# Patient Record
Sex: Female | Born: 1960 | Race: White | Hispanic: No | Marital: Married | Smoking: Former smoker
Health system: Southern US, Community
[De-identification: ages and names within clinical notes are randomized; demographics above are authoritative.]

## PROBLEM LIST (undated history)

## (undated) DIAGNOSIS — D860 Sarcoidosis of lung: Secondary | ICD-10-CM

## (undated) DIAGNOSIS — I5189 Other ill-defined heart diseases: Secondary | ICD-10-CM

## (undated) DIAGNOSIS — R0602 Shortness of breath: Secondary | ICD-10-CM

## (undated) DIAGNOSIS — K219 Gastro-esophageal reflux disease without esophagitis: Secondary | ICD-10-CM

## (undated) DIAGNOSIS — E785 Hyperlipidemia, unspecified: Secondary | ICD-10-CM

## (undated) DIAGNOSIS — M1990 Unspecified osteoarthritis, unspecified site: Secondary | ICD-10-CM

## (undated) DIAGNOSIS — D649 Anemia, unspecified: Secondary | ICD-10-CM

## (undated) DIAGNOSIS — R062 Wheezing: Secondary | ICD-10-CM

## (undated) DIAGNOSIS — R600 Localized edema: Secondary | ICD-10-CM

## (undated) DIAGNOSIS — I10 Essential (primary) hypertension: Secondary | ICD-10-CM

## (undated) DIAGNOSIS — H8110 Benign paroxysmal vertigo, unspecified ear: Secondary | ICD-10-CM

## (undated) DIAGNOSIS — B589 Toxoplasmosis, unspecified: Secondary | ICD-10-CM

## (undated) DIAGNOSIS — E119 Type 2 diabetes mellitus without complications: Secondary | ICD-10-CM

## (undated) HISTORY — PX: WISDOM TOOTH EXTRACTION: SHX21

## (undated) HISTORY — PX: OTHER SURGICAL HISTORY: SHX169

---

## 1994-08-28 HISTORY — PX: CHOLECYSTECTOMY: SHX55

## 2000-05-28 ENCOUNTER — Emergency Department (HOSPITAL_COMMUNITY): Admission: EM | Admit: 2000-05-28 | Discharge: 2000-05-28 | Payer: Self-pay

## 2000-07-03 ENCOUNTER — Encounter: Payer: Self-pay | Admitting: Emergency Medicine

## 2000-07-03 ENCOUNTER — Emergency Department (HOSPITAL_COMMUNITY): Admission: EM | Admit: 2000-07-03 | Discharge: 2000-07-03 | Payer: Self-pay | Admitting: Emergency Medicine

## 2002-03-12 ENCOUNTER — Encounter: Admission: RE | Admit: 2002-03-12 | Discharge: 2002-06-10 | Payer: Self-pay | Admitting: Family Medicine

## 2004-02-11 ENCOUNTER — Other Ambulatory Visit: Admission: RE | Admit: 2004-02-11 | Discharge: 2004-02-11 | Payer: Self-pay | Admitting: Family Medicine

## 2009-11-18 ENCOUNTER — Emergency Department (HOSPITAL_COMMUNITY): Admission: EM | Admit: 2009-11-18 | Discharge: 2009-11-18 | Payer: Self-pay | Admitting: Emergency Medicine

## 2013-06-12 ENCOUNTER — Ambulatory Visit (HOSPITAL_COMMUNITY)
Admission: RE | Admit: 2013-06-12 | Discharge: 2013-06-12 | Disposition: A | Discharge status: Home / Self Care | Payer: 59 | Source: Ambulatory Visit | Attending: Internal Medicine | Admitting: Internal Medicine

## 2013-06-12 ENCOUNTER — Other Ambulatory Visit (HOSPITAL_COMMUNITY): Payer: Self-pay | Admitting: Internal Medicine

## 2013-06-12 DIAGNOSIS — R609 Edema, unspecified: Secondary | ICD-10-CM | POA: Insufficient documentation

## 2013-06-12 DIAGNOSIS — I517 Cardiomegaly: Secondary | ICD-10-CM

## 2013-06-12 DIAGNOSIS — I519 Heart disease, unspecified: Secondary | ICD-10-CM | POA: Insufficient documentation

## 2013-06-12 DIAGNOSIS — I515 Myocardial degeneration: Secondary | ICD-10-CM | POA: Insufficient documentation

## 2013-06-12 NOTE — Progress Notes (Signed)
Echocardiogram 2D Echocardiogram has been performed.  Marissa Wilkerson 06/12/2013, 11:10 AM

## 2013-06-16 ENCOUNTER — Other Ambulatory Visit: Payer: Self-pay | Admitting: Gastroenterology

## 2013-07-16 ENCOUNTER — Encounter (HOSPITAL_COMMUNITY): Payer: Self-pay | Admitting: Pharmacy Technician

## 2013-07-28 ENCOUNTER — Encounter (HOSPITAL_COMMUNITY): Payer: Self-pay | Admitting: *Deleted

## 2013-08-05 ENCOUNTER — Ambulatory Visit (HOSPITAL_COMMUNITY)
Admission: RE | Admit: 2013-08-05 | Discharge: 2013-08-05 | Disposition: A | Discharge status: Home / Self Care | Payer: 59 | Source: Ambulatory Visit | Attending: Gastroenterology | Admitting: Gastroenterology

## 2013-08-05 ENCOUNTER — Encounter (HOSPITAL_COMMUNITY)
Admission: RE | Disposition: A | Discharge status: Home / Self Care | Payer: Self-pay | Source: Ambulatory Visit | Attending: Gastroenterology

## 2013-08-05 ENCOUNTER — Ambulatory Visit (HOSPITAL_COMMUNITY): Payer: 59 | Admitting: Anesthesiology

## 2013-08-05 ENCOUNTER — Encounter (HOSPITAL_COMMUNITY): Payer: Self-pay | Admitting: *Deleted

## 2013-08-05 ENCOUNTER — Encounter (HOSPITAL_COMMUNITY): Payer: 59 | Admitting: Anesthesiology

## 2013-08-05 DIAGNOSIS — Z87891 Personal history of nicotine dependence: Secondary | ICD-10-CM | POA: Insufficient documentation

## 2013-08-05 DIAGNOSIS — Z794 Long term (current) use of insulin: Secondary | ICD-10-CM | POA: Insufficient documentation

## 2013-08-05 DIAGNOSIS — D126 Benign neoplasm of colon, unspecified: Secondary | ICD-10-CM | POA: Insufficient documentation

## 2013-08-05 DIAGNOSIS — Z1211 Encounter for screening for malignant neoplasm of colon: Secondary | ICD-10-CM | POA: Insufficient documentation

## 2013-08-05 DIAGNOSIS — E119 Type 2 diabetes mellitus without complications: Secondary | ICD-10-CM | POA: Insufficient documentation

## 2013-08-05 DIAGNOSIS — I10 Essential (primary) hypertension: Secondary | ICD-10-CM | POA: Insufficient documentation

## 2013-08-05 HISTORY — DX: Essential (primary) hypertension: I10

## 2013-08-05 HISTORY — DX: Unspecified osteoarthritis, unspecified site: M19.90

## 2013-08-05 HISTORY — PX: COLONOSCOPY WITH PROPOFOL: SHX5780

## 2013-08-05 HISTORY — DX: Gastro-esophageal reflux disease without esophagitis: K21.9

## 2013-08-05 HISTORY — DX: Other ill-defined heart diseases: I51.89

## 2013-08-05 HISTORY — DX: Type 2 diabetes mellitus without complications: E11.9

## 2013-08-05 SURGERY — COLONOSCOPY WITH PROPOFOL
Anesthesia: Monitor Anesthesia Care

## 2013-08-05 MED ORDER — ONDANSETRON HCL 4 MG/2ML IJ SOLN
INTRAMUSCULAR | Status: AC
  Filled 2013-08-05: qty 2

## 2013-08-05 MED ORDER — PROPOFOL 10 MG/ML IV BOLUS
INTRAVENOUS | Status: AC
  Filled 2013-08-05: qty 20

## 2013-08-05 MED ORDER — PROPOFOL INFUSION 10 MG/ML OPTIME
INTRAVENOUS | Status: DC | PRN
  Administered 2013-08-05: 140 ug/kg/min via INTRAVENOUS

## 2013-08-05 MED ORDER — SUCCINYLCHOLINE CHLORIDE 20 MG/ML IJ SOLN
INTRAMUSCULAR | Status: AC
  Filled 2013-08-05: qty 1

## 2013-08-05 MED ORDER — FENTANYL CITRATE 0.05 MG/ML IJ SOLN
INTRAMUSCULAR | Status: DC | PRN
  Administered 2013-08-05 (×2): 50 ug via INTRAVENOUS

## 2013-08-05 MED ORDER — ONDANSETRON HCL 4 MG/2ML IJ SOLN
INTRAMUSCULAR | Status: DC | PRN
  Administered 2013-08-05: 4 mg via INTRAVENOUS

## 2013-08-05 MED ORDER — LACTATED RINGERS IV SOLN
INTRAVENOUS | Status: DC
  Administered 2013-08-05: 1000 mL via INTRAVENOUS

## 2013-08-05 MED ORDER — KETAMINE HCL 10 MG/ML IJ SOLN
INTRAMUSCULAR | Status: DC | PRN
  Administered 2013-08-05: 25 mg via INTRAVENOUS

## 2013-08-05 MED ORDER — SODIUM CHLORIDE 0.9 % IV SOLN: INTRAVENOUS | Status: DC

## 2013-08-05 MED ORDER — MIDAZOLAM HCL 2 MG/2ML IJ SOLN
INTRAMUSCULAR | Status: AC
  Filled 2013-08-05: qty 2

## 2013-08-05 MED ORDER — PROMETHAZINE HCL 25 MG/ML IJ SOLN: 6.2500 mg | INTRAMUSCULAR | Status: DC | PRN

## 2013-08-05 MED ORDER — MIDAZOLAM HCL 5 MG/5ML IJ SOLN
INTRAMUSCULAR | Status: DC | PRN
  Administered 2013-08-05 (×2): 1 mg via INTRAVENOUS

## 2013-08-05 MED ORDER — FENTANYL CITRATE 0.05 MG/ML IJ SOLN
INTRAMUSCULAR | Status: AC
  Filled 2013-08-05: qty 2

## 2013-08-05 MED ORDER — KETAMINE HCL 50 MG/ML IJ SOLN
INTRAMUSCULAR | Status: AC
  Filled 2013-08-05: qty 10

## 2013-08-05 SURGICAL SUPPLY — 22 items

## 2013-08-05 NOTE — Preoperative (Signed)
Beta Blockers   Reason not to administer Beta Blockers:Not Applicable 

## 2013-08-05 NOTE — Op Note (Signed)
Procedure: Screening colonoscopy  Endoscopist: Earle Gell  Premedication: Propofol administered by anesthesia  Procedure: The patient was placed in the left lateral decubitus position. Anal inspection and digital rectal exam were normal. The Pentax pediatric colonoscope was introduced into the rectum and advanced to the cecum. A normal-appearing appendiceal orifice and ileocecal valve were identified. Colonic preparation for the exam today was good.  Rectum. Normal. I was unable to retroflex the endoscope in the rectum  Sigmoid colon and descending colon. Normal  Splenic flexure. Normal  Transverse colon. From the distal transverse colon, a 5 mm pedunculated polyp was removed with the hot snare and submitted for pathologic interpretation  Hepatic flexure. Normal  Ascending colon. Normal  Cecum and ileocecal valve. Normal  Assessment:  #1. From the distal transverse colon a 5 mm pedunculated polyp was removed and submitted for pathologic interpretation  #2. Otherwise normal screening proctocolonoscopy to the cecum  Recommendations: If colon polyp returns adenomatous pathologically, the patient should undergo a surveillance colonoscopy in 5 years. If the polyp returns non-neoplastic, the patient should undergo a repeat screening colonoscopy in 10 years

## 2013-08-05 NOTE — Anesthesia Postprocedure Evaluation (Signed)
  Anesthesia Post-op Note  Patient: Marissa Wilkerson  Procedure(s) Performed: Procedure(s) (LRB): COLONOSCOPY WITH PROPOFOL (N/A)  Patient Location: PACU  Anesthesia Type: MAC  Level of Consciousness: awake and alert   Airway and Oxygen Therapy: Patient Spontanous Breathing  Post-op Pain: mild  Post-op Assessment: Post-op Vital signs reviewed, Patient's Cardiovascular Status Stable, Respiratory Function Stable, Patent Airway and No signs of Nausea or vomiting  Last Vitals:  Filed Vitals:   08/05/13 0905  BP:   Pulse:   Temp: 36.8 C  Resp:     Post-op Vital Signs: stable   Complications: No apparent anesthesia complications

## 2013-08-05 NOTE — Anesthesia Preprocedure Evaluation (Signed)
Anesthesia Evaluation  Patient identified by MRN, date of birth, ID band Patient awake    Reviewed: Allergy & Precautions, H&P , NPO status , Patient's Chart, lab work & pertinent test results  Airway Mallampati: II TM Distance: >3 FB Neck ROM: Full    Dental no notable dental hx.    Pulmonary neg pulmonary ROS, former smoker,  breath sounds clear to auscultation  Pulmonary exam normal       Cardiovascular hypertension, Pt. on medications Rhythm:Regular Rate:Normal     Neuro/Psych negative neurological ROS  negative psych ROS   GI/Hepatic Neg liver ROS,   Endo/Other  negative endocrine ROSdiabetes, Insulin Dependent  Renal/GU negative Renal ROS  negative genitourinary   Musculoskeletal negative musculoskeletal ROS (+)   Abdominal   Peds negative pediatric ROS (+)  Hematology negative hematology ROS (+)   Anesthesia Other Findings   Reproductive/Obstetrics negative OB ROS                           Anesthesia Physical Anesthesia Plan  ASA: III  Anesthesia Plan: MAC   Post-op Pain Management:    Induction: Intravenous  Airway Management Planned: Nasal Cannula  Additional Equipment:   Intra-op Plan:   Post-operative Plan:   Informed Consent: I have reviewed the patients History and Physical, chart, labs and discussed the procedure including the risks, benefits and alternatives for the proposed anesthesia with the patient or authorized representative who has indicated his/her understanding and acceptance.   Dental advisory given  Plan Discussed with: CRNA and Surgeon  Anesthesia Plan Comments:         Anesthesia Quick Evaluation

## 2013-08-05 NOTE — H&P (Signed)
  Procedure: Screening colonoscopy  History: The patient is a 52 year old female born Apr 14, 1961. The patient is scheduled to undergo her first screening colonoscopy with polypectomy to prevent colon cancer.  Past medical history: Type 2 diabetes mellitus. Cholecystectomy.  Allergies: Sulfa drugs caused rash. Metformin caused diarrhea.  Exam: The patient is alert and lying comfortably on the endoscopy stretcher. Abdomen is soft and nontender to palpation. Cardiac exam reveals a regular rhythm. Lungs are clear to auscultation.  Plan: Proceed with baseline screening colonoscopy

## 2013-08-05 NOTE — Transfer of Care (Signed)
Immediate Anesthesia Transfer of Care Note  Patient: Marissa Wilkerson  Procedure(s) Performed: Procedure(s): COLONOSCOPY WITH PROPOFOL (N/A)  Patient Location: PACU  Anesthesia Type:MAC  Level of Consciousness: awake, alert  and oriented  Airway & Oxygen Therapy: Patient Spontanous Breathing and Patient connected to face mask oxygen  Post-op Assessment: Report given to PACU RN and Post -op Vital signs reviewed and stable  Post vital signs: Reviewed and stable  Complications: No apparent anesthesia complications

## 2013-08-06 ENCOUNTER — Encounter (HOSPITAL_COMMUNITY): Payer: Self-pay | Admitting: Gastroenterology

## 2013-09-02 ENCOUNTER — Encounter (HOSPITAL_COMMUNITY): Payer: Self-pay

## 2013-09-02 ENCOUNTER — Emergency Department (HOSPITAL_COMMUNITY): Payer: 59

## 2013-09-02 ENCOUNTER — Inpatient Hospital Stay (HOSPITAL_COMMUNITY)
Admission: EM | Admit: 2013-09-02 | Discharge: 2013-09-05 | DRG: 189 | Disposition: A | Discharge status: Home / Self Care | Payer: 59 | Attending: Internal Medicine | Admitting: Internal Medicine

## 2013-09-02 DIAGNOSIS — E8779 Other fluid overload: Secondary | ICD-10-CM | POA: Diagnosis present

## 2013-09-02 DIAGNOSIS — Z8249 Family history of ischemic heart disease and other diseases of the circulatory system: Secondary | ICD-10-CM

## 2013-09-02 DIAGNOSIS — Z791 Long term (current) use of non-steroidal anti-inflammatories (NSAID): Secondary | ICD-10-CM

## 2013-09-02 DIAGNOSIS — Z881 Allergy status to other antibiotic agents status: Secondary | ICD-10-CM

## 2013-09-02 DIAGNOSIS — Z794 Long term (current) use of insulin: Secondary | ICD-10-CM

## 2013-09-02 DIAGNOSIS — R918 Other nonspecific abnormal finding of lung field: Secondary | ICD-10-CM

## 2013-09-02 DIAGNOSIS — J9600 Acute respiratory failure, unspecified whether with hypoxia or hypercapnia: Principal | ICD-10-CM | POA: Diagnosis present

## 2013-09-02 DIAGNOSIS — E785 Hyperlipidemia, unspecified: Secondary | ICD-10-CM | POA: Diagnosis present

## 2013-09-02 DIAGNOSIS — J984 Other disorders of lung: Secondary | ICD-10-CM | POA: Diagnosis present

## 2013-09-02 DIAGNOSIS — Z833 Family history of diabetes mellitus: Secondary | ICD-10-CM

## 2013-09-02 DIAGNOSIS — E119 Type 2 diabetes mellitus without complications: Secondary | ICD-10-CM | POA: Diagnosis present

## 2013-09-02 DIAGNOSIS — Z87891 Personal history of nicotine dependence: Secondary | ICD-10-CM

## 2013-09-02 DIAGNOSIS — I10 Essential (primary) hypertension: Secondary | ICD-10-CM | POA: Diagnosis present

## 2013-09-02 DIAGNOSIS — Z91199 Patient's noncompliance with other medical treatment and regimen due to unspecified reason: Secondary | ICD-10-CM

## 2013-09-02 DIAGNOSIS — H5440 Blindness, one eye, unspecified eye: Secondary | ICD-10-CM | POA: Diagnosis present

## 2013-09-02 DIAGNOSIS — Z888 Allergy status to other drugs, medicaments and biological substances status: Secondary | ICD-10-CM

## 2013-09-02 DIAGNOSIS — J9601 Acute respiratory failure with hypoxia: Secondary | ICD-10-CM

## 2013-09-02 DIAGNOSIS — M129 Arthropathy, unspecified: Secondary | ICD-10-CM | POA: Diagnosis present

## 2013-09-02 DIAGNOSIS — Z7982 Long term (current) use of aspirin: Secondary | ICD-10-CM

## 2013-09-02 DIAGNOSIS — D860 Sarcoidosis of lung: Secondary | ICD-10-CM | POA: Diagnosis present

## 2013-09-02 DIAGNOSIS — I5189 Other ill-defined heart diseases: Secondary | ICD-10-CM | POA: Diagnosis present

## 2013-09-02 DIAGNOSIS — Z9119 Patient's noncompliance with other medical treatment and regimen: Secondary | ICD-10-CM

## 2013-09-02 DIAGNOSIS — J9610 Chronic respiratory failure, unspecified whether with hypoxia or hypercapnia: Secondary | ICD-10-CM | POA: Diagnosis present

## 2013-09-02 DIAGNOSIS — R0902 Hypoxemia: Secondary | ICD-10-CM | POA: Diagnosis present

## 2013-09-02 DIAGNOSIS — E669 Obesity, unspecified: Secondary | ICD-10-CM | POA: Diagnosis present

## 2013-09-02 DIAGNOSIS — R609 Edema, unspecified: Secondary | ICD-10-CM | POA: Diagnosis present

## 2013-09-02 DIAGNOSIS — K219 Gastro-esophageal reflux disease without esophagitis: Secondary | ICD-10-CM | POA: Diagnosis present

## 2013-09-02 DIAGNOSIS — Z79899 Other long term (current) drug therapy: Secondary | ICD-10-CM

## 2013-09-02 HISTORY — DX: Toxoplasmosis, unspecified: B58.9

## 2013-09-02 HISTORY — DX: Hyperlipidemia, unspecified: E78.5

## 2013-09-02 LAB — CBC WITH DIFFERENTIAL/PLATELET
BASOS ABS: 0.1 10*3/uL (ref 0.0–0.1)
Basophils Relative: 1 % (ref 0–1)
EOS ABS: 0.2 10*3/uL (ref 0.0–0.7)
Eosinophils Relative: 3 % (ref 0–5)
HCT: 35.2 % — ABNORMAL LOW (ref 36.0–46.0)
Hemoglobin: 12 g/dL (ref 12.0–15.0)
Lymphocytes Relative: 21 % (ref 12–46)
Lymphs Abs: 1.6 10*3/uL (ref 0.7–4.0)
MCH: 29.7 pg (ref 26.0–34.0)
MCHC: 34.1 g/dL (ref 30.0–36.0)
MCV: 87.1 fL (ref 78.0–100.0)
Monocytes Absolute: 0.9 10*3/uL (ref 0.1–1.0)
Monocytes Relative: 11 % (ref 3–12)
NEUTROS ABS: 4.9 10*3/uL (ref 1.7–7.7)
NEUTROS PCT: 64 % (ref 43–77)
PLATELETS: 319 10*3/uL (ref 150–400)
RBC: 4.04 MIL/uL (ref 3.87–5.11)
RDW: 15.8 % — AB (ref 11.5–15.5)
WBC: 7.7 10*3/uL (ref 4.0–10.5)

## 2013-09-02 LAB — POCT I-STAT, CHEM 8
BUN: 18 mg/dL (ref 6–23)
Calcium, Ion: 1.33 mmol/L — ABNORMAL HIGH (ref 1.12–1.23)
Chloride: 100 mEq/L (ref 96–112)
Creatinine, Ser: 0.7 mg/dL (ref 0.50–1.10)
Glucose, Bld: 174 mg/dL — ABNORMAL HIGH (ref 70–99)
HEMATOCRIT: 37 % (ref 36.0–46.0)
HEMOGLOBIN: 12.6 g/dL (ref 12.0–15.0)
Potassium: 4 mEq/L (ref 3.7–5.3)
SODIUM: 137 meq/L (ref 137–147)
TCO2: 23 mmol/L (ref 0–100)

## 2013-09-02 LAB — GLUCOSE, CAPILLARY
GLUCOSE-CAPILLARY: 196 mg/dL — AB (ref 70–99)
Glucose-Capillary: 147 mg/dL — ABNORMAL HIGH (ref 70–99)
Glucose-Capillary: 261 mg/dL — ABNORMAL HIGH (ref 70–99)

## 2013-09-02 LAB — POCT I-STAT TROPONIN I: Troponin i, poc: 0.01 ng/mL (ref 0.00–0.08)

## 2013-09-02 LAB — PRO B NATRIURETIC PEPTIDE: PRO B NATRI PEPTIDE: 91.1 pg/mL (ref 0–125)

## 2013-09-02 LAB — D-DIMER, QUANTITATIVE (NOT AT ARMC): D DIMER QUANT: 1.24 ug{FEU}/mL — AB (ref 0.00–0.48)

## 2013-09-02 MED ORDER — INSULIN ASPART 100 UNIT/ML ~~LOC~~ SOLN
0.0000 [IU] | Freq: Three times a day (TID) | SUBCUTANEOUS | Status: DC
  Administered 2013-09-02 (×2): 2 [IU] via SUBCUTANEOUS
  Administered 2013-09-03: 13:00:00 3 [IU] via SUBCUTANEOUS
  Administered 2013-09-03: 18:00:00 5 [IU] via SUBCUTANEOUS
  Administered 2013-09-03 – 2013-09-05 (×4): 2 [IU] via SUBCUTANEOUS
  Administered 2013-09-05: 08:00:00 5 [IU] via SUBCUTANEOUS

## 2013-09-02 MED ORDER — SODIUM CHLORIDE 0.9 % IJ SOLN: 3.0000 mL | INTRAMUSCULAR | Status: DC | PRN

## 2013-09-02 MED ORDER — INSULIN ASPART 100 UNIT/ML ~~LOC~~ SOLN
0.0000 [IU] | Freq: Every day | SUBCUTANEOUS | Status: DC
Start: 2013-09-02 — End: 2013-09-05
  Administered 2013-09-02: 22:00:00 via SUBCUTANEOUS
  Administered 2013-09-03: 22:00:00 3 [IU] via SUBCUTANEOUS

## 2013-09-02 MED ORDER — ATORVASTATIN CALCIUM 10 MG PO TABS
10.0000 mg | ORAL_TABLET | Freq: Every day | ORAL | Status: DC
  Filled 2013-09-02: qty 1

## 2013-09-02 MED ORDER — ONDANSETRON HCL 4 MG PO TABS: 4.0000 mg | ORAL_TABLET | Freq: Four times a day (QID) | ORAL | Status: DC | PRN

## 2013-09-02 MED ORDER — ALBUTEROL SULFATE (2.5 MG/3ML) 0.083% IN NEBU
2.5000 mg | INHALATION_SOLUTION | Freq: Once | RESPIRATORY_TRACT | Status: AC
  Administered 2013-09-02: 2.5 mg via RESPIRATORY_TRACT
  Filled 2013-09-02: qty 3

## 2013-09-02 MED ORDER — IBUPROFEN 800 MG PO TABS: 800.0000 mg | ORAL_TABLET | Freq: Four times a day (QID) | ORAL | Status: DC | PRN

## 2013-09-02 MED ORDER — SODIUM CHLORIDE 0.9 % IV SOLN: 250.0000 mL | INTRAVENOUS | Status: DC | PRN

## 2013-09-02 MED ORDER — ALBUTEROL SULFATE (2.5 MG/3ML) 0.083% IN NEBU: 2.5000 mg | INHALATION_SOLUTION | RESPIRATORY_TRACT | Status: DC | PRN

## 2013-09-02 MED ORDER — ADULT MULTIVITAMIN W/MINERALS CH
1.0000 | ORAL_TABLET | Freq: Every day | ORAL | Status: DC
  Filled 2013-09-02: qty 1

## 2013-09-02 MED ORDER — PIPERACILLIN-TAZOBACTAM 3.375 G IVPB 30 MIN
3.3750 g | Freq: Once | INTRAVENOUS | Status: DC
  Filled 2013-09-02: qty 50

## 2013-09-02 MED ORDER — SODIUM CHLORIDE 0.9 % IJ SOLN
3.0000 mL | Freq: Two times a day (BID) | INTRAMUSCULAR | Status: DC
  Administered 2013-09-02 – 2013-09-05 (×5): 3 mL via INTRAVENOUS

## 2013-09-02 MED ORDER — SODIUM CHLORIDE 0.9 % IJ SOLN
3.0000 mL | Freq: Two times a day (BID) | INTRAMUSCULAR | Status: DC
  Administered 2013-09-04: 3 mL via INTRAVENOUS

## 2013-09-02 MED ORDER — ENOXAPARIN SODIUM 40 MG/0.4ML ~~LOC~~ SOLN: 40.0000 mg | SUBCUTANEOUS | Status: DC

## 2013-09-02 MED ORDER — ASPIRIN EC 81 MG PO TBEC
81.0000 mg | DELAYED_RELEASE_TABLET | Freq: Every day | ORAL | Status: DC
  Filled 2013-09-02: qty 1

## 2013-09-02 MED ORDER — ENOXAPARIN SODIUM 60 MG/0.6ML ~~LOC~~ SOLN
50.0000 mg | SUBCUTANEOUS | Status: DC
  Administered 2013-09-02: 50 mg via SUBCUTANEOUS
  Filled 2013-09-02 (×2): qty 0.6

## 2013-09-02 MED ORDER — INSULIN GLARGINE 100 UNIT/ML ~~LOC~~ SOLN
30.0000 [IU] | Freq: Every day | SUBCUTANEOUS | Status: DC
  Administered 2013-09-02 – 2013-09-04 (×3): 30 [IU] via SUBCUTANEOUS
  Filled 2013-09-02 (×4): qty 0.3

## 2013-09-02 MED ORDER — ADULT MULTIVITAMIN W/MINERALS CH
1.0000 | ORAL_TABLET | Freq: Every day | ORAL | Status: DC
  Administered 2013-09-02 – 2013-09-04 (×3): 1 via ORAL
  Filled 2013-09-02 (×4): qty 1

## 2013-09-02 MED ORDER — HYDROCHLOROTHIAZIDE 12.5 MG PO CAPS
12.5000 mg | ORAL_CAPSULE | Freq: Every day | ORAL | Status: DC
  Administered 2013-09-02: 18:00:00 12.5 mg via ORAL
  Filled 2013-09-02 (×2): qty 1

## 2013-09-02 MED ORDER — CENTRUM ADULTS PO TABS: 1.0000 | ORAL_TABLET | Freq: Every day | ORAL | Status: DC

## 2013-09-02 MED ORDER — VANCOMYCIN HCL IN DEXTROSE 1-5 GM/200ML-% IV SOLN
1000.0000 mg | Freq: Once | INTRAVENOUS | Status: DC
  Filled 2013-09-02: qty 200

## 2013-09-02 MED ORDER — ACETAMINOPHEN 325 MG PO TABS
650.0000 mg | ORAL_TABLET | Freq: Four times a day (QID) | ORAL | Status: DC | PRN
  Administered 2013-09-03: 650 mg via ORAL
  Filled 2013-09-02: qty 2

## 2013-09-02 MED ORDER — LISINOPRIL 10 MG PO TABS
10.0000 mg | ORAL_TABLET | Freq: Every day | ORAL | Status: DC
  Administered 2013-09-02 – 2013-09-04 (×3): 10 mg via ORAL
  Filled 2013-09-02 (×4): qty 1

## 2013-09-02 MED ORDER — HYDROCHLOROTHIAZIDE 12.5 MG PO CAPS
12.5000 mg | ORAL_CAPSULE | Freq: Every day | ORAL | Status: DC
  Filled 2013-09-02: qty 1

## 2013-09-02 MED ORDER — FUROSEMIDE 10 MG/ML IJ SOLN
40.0000 mg | Freq: Once | INTRAMUSCULAR | Status: AC
  Administered 2013-09-02: 40 mg via INTRAVENOUS
  Filled 2013-09-02: qty 4

## 2013-09-02 MED ORDER — LISINOPRIL 10 MG PO TABS
10.0000 mg | ORAL_TABLET | Freq: Every day | ORAL | Status: DC
  Filled 2013-09-02: qty 1

## 2013-09-02 MED ORDER — ATORVASTATIN CALCIUM 10 MG PO TABS
10.0000 mg | ORAL_TABLET | Freq: Every day | ORAL | Status: DC
  Administered 2013-09-02 – 2013-09-04 (×3): 10 mg via ORAL
  Filled 2013-09-02 (×4): qty 1

## 2013-09-02 MED ORDER — IOHEXOL 350 MG/ML SOLN: 100.0000 mL | Freq: Once | INTRAVENOUS | Status: DC | PRN

## 2013-09-02 MED ORDER — LISINOPRIL-HYDROCHLOROTHIAZIDE 10-12.5 MG PO TABS: 1.0000 | ORAL_TABLET | Freq: Every day | ORAL | Status: DC

## 2013-09-02 MED ORDER — ASPIRIN EC 81 MG PO TBEC
81.0000 mg | DELAYED_RELEASE_TABLET | Freq: Every day | ORAL | Status: DC
  Administered 2013-09-02 – 2013-09-04 (×3): 81 mg via ORAL
  Filled 2013-09-02 (×4): qty 1

## 2013-09-02 MED ORDER — DIPHENHYDRAMINE HCL 25 MG PO CAPS
25.0000 mg | ORAL_CAPSULE | Freq: Every evening | ORAL | Status: DC | PRN
  Administered 2013-09-04: 25 mg via ORAL
  Filled 2013-09-02: qty 1

## 2013-09-02 MED ORDER — ONDANSETRON HCL 4 MG/2ML IJ SOLN: 4.0000 mg | Freq: Four times a day (QID) | INTRAMUSCULAR | Status: DC | PRN

## 2013-09-02 NOTE — H&P (Signed)
[facility]  History and Physical  Marissa Wilkerson [id number] DOB: [birth year] DOA: 09/02/2013  Referring physician: EDP PCP: Kandice Hams, MD  Outpatient Specialists:  1. None  Chief Complaint: Dyspnea  HPI: Marissa Wilkerson is a 53 y.o. 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 She gives approximately 3-4 month history of intermittent bilateral leg edema, attributed to diastolic dysfunction and Motrin use. She was started on diuretics with some improvement. 3 weeks ago, she first started noticing dyspnea on exertion while ambulating from the hospital parking deck to the hospital entrance. Since then this dyspnea hasn't fluctuated with some good days and some not. Last night while working on the hospital floor, her dyspnea on exertion seemed to get worse where even ambulating 150 feet caused her to stop. She checked her oxygen saturation which 83% on room air which improved after taking some deep breaths. She denies any fever, chills, night sweats, weight loss, cough, chest pain. She has 4 cats at home none of which are new. No birds or other pets at home. No recent travel. Was exposed to a family member with upper respiratory symptoms during Christmas. In the ED, patient was afebrile, no leukocytosis, normal proBNP, d-dimer elevated, CTA chest showed findings suggestive of pulmonary nodules/mass & no PE. Hospitalist admission requested.  Review of Systems: All systems reviewed and apart from history of presenting illness, are negative.  Past Medical History  Diagnosis Date  . Hypertension   . Diabetes mellitus without complication   . GERD (gastroesophageal reflux disease)   . Arthritis   . Diastolic dysfunction     followed by dr polite found on echo 06-12-13 , grade 1 per pt  .  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 Dyslipidemia    Past Surgical History  Procedure Laterality Date  . Cholecystectomy  1996  . Wisdom tooth extraction  yrs ago  . All teeth extraction  7-8 yrs ago  . Colonoscopy with propofol N/A 08/05/2013    Procedure: COLONOSCOPY WITH PROPOFOL;  Surgeon: Garlan Fair, MD;  Location: [facility];  Service: Endoscopy;  Laterality: N/A;   Social History:  reports that she quit smoking about 1 years ago. Her smoking use included Cigarettes. She has a 12.5 pack-year smoking history. She has never used smokeless tobacco. She reports that she drinks alcohol. She reports that she does not use illicit drugs.  married. 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  Allergies  Allergen Reactions  . Metformin And Related Nausea And Vomiting  . Sulfa Antibiotics Rash    Family History  Problem Relation Age of Onset  . Heart failure Mother   . Diabetes Father   . Hypertension Father   . Diabetes Brother   . Hypertension Brother     Prior to Admission medications   Medication Sig Start Date End Date Taking? Authorizing Provider  aspirin EC 81 MG tablet Take 81 mg by mouth daily.   Yes Historical Provider, MD  atorvastatin (LIPITOR) 10 MG tablet Take 10 mg by mouth at bedtime.   Yes Historical Provider, MD  diphenhydrAMINE (BENADRYL) 25 mg capsule Take 25 mg by mouth every 6 (six) hours as needed for sleep.   Yes Historical Provider,  MD  ibuprofen (ADVIL,MOTRIN) 200 MG tablet Take 800 mg by mouth every 6 (six) hours as needed (pain).   Yes Historical Provider, MD  insulin glargine (LANTUS) 100 UNIT/ML injection Inject 30 Units into the skin at bedtime.   Yes Historical Provider, MD  lisinopril-hydrochlorothiazide (PRINZIDE,ZESTORETIC) 10-12.5 MG per tablet Take 1 tablet by mouth at bedtime.   Yes Historical Provider, MD  Multiple Vitamins-Minerals (CENTRUM ADULTS)  TABS Take 1 tablet by mouth daily.   Yes Historical Provider, MD  sitaGLIPtin (JANUVIA) 100 MG tablet Take 100 mg by mouth at bedtime.   Yes Historical Provider, MD   Physical Exam: Filed Vitals:   09/02/13 0246 09/02/13 0506 09/02/13 0815  BP: 147/55  137/68  Pulse: 92  91  Temp: 99 F (37.2 C)  98.6 F (37 C)  TempSrc: Oral  Oral  Resp:   26  SpO2: 91% 93% 97%     General exam: Moderately built and  obese female  patient, lying comfortably  propped up on the gurney in no obvious distress.  Head, eyes and ENT: Nontraumatic and normocephalic. Pupils equally reacting to light and accommodation. Oral mucosa moist.  Neck: Supple. No JVD, carotid bruit or thyromegaly.  Lymphatics: No lymphadenopathy.  Respiratory system:  Occasional bibasal crackles but otherwise clear to auscultation . No increased work of breathing. Able to speak in full sentences.  Cardiovascular system: S1 and S2 heard, RRR. No JVD, murmurs, gallops, clicks. Trace bilateral ankle edema left greater than and right .  Gastrointestinal system: Abdomen is nondistended, soft and nontender. Normal bowel sounds heard. No organomegaly or masses appreciated. small area of periumbilical ecchymosis at site of insulin shot. Laparotomy scar RUQ.  Central nervous system: Alert and oriented. No focal neurological deficits.  Extremities: Symmetric 5 x 5 power. Peripheral pulses symmetrically felt.   Skin: No rashes or acute findings.  Musculoskeletal system: Negative exam.  Psychiatry: Pleasant and cooperative.   Labs on Admission:  Basic Metabolic Panel:  Recent Labs Lab 09/02/13 0528  NA 137  K 4.0  CL 100  GLUCOSE 174*  BUN 18  CREATININE 0.70   Liver Function Tests: No results found for this basename: AST, ALT, ALKPHOS, BILITOT, PROT, ALBUMIN,  in the last 168 hours No results found for this basename: LIPASE, AMYLASE,  in the last 168 hours No results found for this basename: AMMONIA,  in the last 168  hours CBC:  Recent Labs Lab 09/02/13 0515 09/02/13 0528  WBC 7.7  --   NEUTROABS 4.9  --   HGB 12.0 12.6  HCT 35.2* 37.0  MCV 87.1  --   PLT 319  --    Cardiac Enzymes: No results found for this basename: CKTOTAL, CKMB, CKMBINDEX, TROPONINI,  in the last 168 hours  BNP (last 3 results)  Recent Labs  09/02/13 0515  PROBNP 91.1   CBG: No results found for this basename: GLUCAP,  in the last 168 hours  Radiological Exams on Admission: Dg Chest 2 View  09/02/2013   CLINICAL DATA:  Shortness of breath  EXAM: CHEST  2 VIEW  COMPARISON:  11/18/2009  FINDINGS: Chronic cardiomegaly. Diffuse interstitial prominence. Streaky lower lung opacities, likely a combination of mild atelectasis and scar. Question trace left effusion.  IMPRESSION: Interstitial coarsening above baseline, which could be from airway infection or developing edema.   Electronically Signed   By: Jorje Guild M.D.   On: 09/02/2013 03:16   Ct Angio Chest Pe W/cm &/or Wo Cm  09/02/2013  CLINICAL DATA:  53 year old female with increasing shortness of Breath. Cardiac diastolic dysfunction. Abnormal D-dimer. Initial encounter.  EXAM: CT ANGIOGRAPHY CHEST WITH CONTRAST  TECHNIQUE: Multidetector CT imaging of the chest was performed using the standard protocol during bolus administration of intravenous contrast. Multiplanar CT image reconstructions including MIPs were obtained to evaluate the vascular anatomy.  CONTRAST:  100 mL Omnipaque 300.  COMPARISON:  Chest radiographs from the same day reported separately.  FINDINGS: Adequate contrast bolus timing in the pulmonary arterial tree.  No focal filling defect identified in the pulmonary arterial tree to suggest the presence of acute pulmonary embolism.  In the left lower lung affecting both the lingula and anterior basal segment of the lower lobe there is a mass like opacity measuring 40 x 31 x 25 mm, affecting both sides of the major fissure (series 7 image when 126).  There are  numerous superimposed solid to the semi solid pulmonary nodules throughout both lungs. Most measure 5-6 mm diameter, the largest are 10-11 mm. These head indistinct margins, resembling ground-glass. Superimposed bilateral mosaic attenuation. Additional more confluent opacity in both costophrenic sulci and dependently along the lateral right major fissure. Superimposed curvilinear probable scarring in the left apex. Major airways are patent.  No pericardial or pleural effusion, but there is mediastinal lymphadenopathy in the form of increased size and number of nodes, measuring up to 14 mm short axis individually. Bilateral hilar nodes are less affected. There is cardiomegaly.  Negative visualized liver dome and spleen. Thyromegaly with only a subcentimeter right lobe thyroid nodule visible, too small to characterize, but most likely benign in the absence of known clinical risk factors for thyroid carcinoma.  Visible aorta and great vessels are patent. No definite aortic atherosclerosis.  No acute osseous abnormality identified.  Review of the MIP images confirms the above findings.  IMPRESSION: 1.  No evidence of acute pulmonary embolus. 2. Abnormal lungs with numerous indistinct bilateral pulmonary nodules (semi-solid appearing), with superimposed confluent mass like pulmonary opacity in the left lower lung crossing the major fissure. Favor disseminated infection rather than neoplastic/metastatic disorder. Consider bacterial, fungal, and atypical etiologies (including invasive aspergillosis if the patient is immunocompromised). 3. Mediastinal lymphadenopathy, favor reactive.  Study discussed by telephone with APRIL [facility] on 09/02/2013 at 07:31. We discussed contact precautions and further infectious workup.   Electronically Signed   By: Lars Pinks M.D.   On: 09/02/2013 07:37    EKG: Independently reviewed.  Sinus rhythm without acute findings.  Assessment/Plan Principal Problem:   Acute respiratory  failure with hypoxia Active Problems:   Hypertension   Diabetes mellitus without complication   Diastolic dysfunction   Acute hypoxic respiratory failure/bilateral pulmonary nodules/left lower lung mass - DD: Atypical Infectious etiology versus malignancy versus other etiologies - CTA chest was negative for PE but showed bilateral pulmonary nodules and possible left lower lung mass - Admit to telemetry for close observation and evaluation. - Discussed with infectious disease M.D. on call and agree that there is no indication for any form of respiratory isolation at this time. - Pulmonology consulted for further evaluation. Options may be to treat empirically with antibiotics and followup CT in a couple of weeks versus bronchoscopy for cultures and biopsy. Vancomycin and Zosyn that were ordered in the ED were discontinued-patient does not look septic or toxic and antibiotics may muddy up the picture if biopsy planned.  Uncontrolled type II DM - Continue home dose of Lantus. Hold oral hypoglycemics. Add SSI. Monitor  Hypertension - Reasonably  controlled. Continue home medications  Dyslipidemia - Continue statins  History of diastolic dysfunction -   Code Status:  Full   Family Communication:  Discussed with spouse at bedside.  Disposition Plan:  Home when medically stable.   Time spent:  83 minutes  Ila Landowski, MD, FACP, FHM. [facility] Pager [phone]  If 7PM-7AM, please contact night-coverage [email or web address] Password [facility] 09/02/2013, 9:37 AM

## 2013-09-02 NOTE — ED Notes (Signed)
Bed: WA23 Expected date:  Expected time:  Means of arrival:  Comments: 

## 2013-09-02 NOTE — Consult Note (Signed)
Name: Marissa Wilkerson MRN: [id number] DOB: [birth year]    ADMISSION DATE:  09/02/2013 CONSULTATION DATE:  1-6  REFERRING MD :  [facility] PRIMARY SERVICE: [facility]  CHIEF COMPLAINT: SOB  SIGNIFICANT EVENTS / STUDIES:  TTE 06/12/13 >> normal LV size and fxn, grade 1 diastolic dysfxn, mild LA dilation, PASP 33mmHg,   HISTORY OF PRESENT ILLNESS:  53 yo wf(Obese) former smoker(quit 1 year ago) poorly controlled diabetic and HTN who presents to [facility] 1-6 with hypoxia(84% on RA). She has dyspnea that has been insidious over about 3-4 weeks.  She denies FCS or purulent sputum production.  No recent URI or unusual exposures or travels. 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 Of note she recently had loss of taste and smell attributed to uncontrolled DM. Further since sept. 2014 increased lower ext edema 4+ treated with HCTZ per Dr. Delfina Redwood. Following her presentation to [facility] CT angio revealed Abnormal lungs with numerous indistinct bilateral pulmonary nodules (semi-solid appearing), with superimposed confluent mass  like pulmonary opacity in the left lower lung crossing the major fissure. There was no PE. TTE from 10/14 showed no evidence of PAH or LV dysfxn.    PAST MEDICAL HISTORY :  Past Medical History  Diagnosis Date  . Hypertension   . Diabetes mellitus without complication   . GERD (gastroesophageal reflux disease)   . Arthritis   . Diastolic dysfunction     followed by dr polite found on echo 06-12-13 , grade 1 per pt  . 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 Dyslipidemia    Past Surgical History  Procedure Laterality Date  . Cholecystectomy  1996  . Wisdom tooth extraction  yrs ago  . All teeth extraction  7-8 yrs ago  . Colonoscopy with propofol N/A 08/05/2013    Procedure: COLONOSCOPY WITH PROPOFOL;  Surgeon: Garlan Fair, MD;  Location: [facility] ENDOSCOPY;  Service: Endoscopy;  Laterality: N/A;    Prior to Admission medications   Medication Sig Start Date End Date Taking? Authorizing Provider  aspirin EC 81 MG tablet Take 81 mg by mouth daily.   Yes Historical Provider, MD  atorvastatin (LIPITOR) 10 MG tablet Take 10 mg by mouth at bedtime.   Yes Historical Provider, MD  diphenhydrAMINE (BENADRYL) 25 mg capsule Take 25 mg by mouth every 6 (six) hours as needed for sleep.   Yes Historical Provider, MD  ibuprofen (ADVIL,MOTRIN) 200 MG tablet Take 800 mg by mouth every 6 (six) hours as needed (pain).   Yes Historical Provider, MD  insulin glargine (LANTUS) 100 UNIT/ML injection Inject 30 Units into the skin at bedtime.   Yes Historical Provider, MD  lisinopril-hydrochlorothiazide (PRINZIDE,ZESTORETIC) 10-12.5 MG per tablet Take 1 tablet by mouth at bedtime.   Yes Historical Provider, MD  Multiple Vitamins-Minerals (CENTRUM ADULTS) TABS Take 1 tablet by mouth daily.   Yes Historical Provider, MD  sitaGLIPtin (JANUVIA) 100 MG tablet Take 100 mg by mouth at bedtime.   Yes Historical Provider, MD   Allergies  Allergen Reactions  . Metformin And Related Nausea And Vomiting  . Sulfa Antibiotics Rash    FAMILY HISTORY:  Family History  Problem Relation Age of Onset  . Heart failure Mother   . Diabetes Father   . Hypertension Father   . Diabetes Brother   . Hypertension Brother    SOCIAL HISTORY:  reports that she  quit smoking about 1 years ago. Her smoking use included Cigarettes. She has a 12.5 pack-year smoking history. She has never used smokeless tobacco. She reports that she drinks alcohol. She reports that she does not use illicit drugs. Works as a Marine scientist. No significant travel or exposures.   REVIEW OF SYSTEMS:  10 point review of system taken, please see HPI for positives and negatives.   SUBJECTIVE:  Feels well as long as she is still. When walking she is SOB  VITAL SIGNS: Temp:  [97.9 F (36.6 C)-98.8 F (37.1 C)] 97.9 F (36.6 C) (01/07 0405) Pulse Rate:  [74-89]  74 (01/07 0405) Resp:  [20] 20 (01/07 0405) BP: (112-146)/(50-63) 112/56 mmHg (01/07 0405) SpO2:  [85 %-99 %] 99 % (01/07 0405) Weight:  [94.394 kg (208 lb 1.6 oz)-95.1 kg (209 lb 10.5 oz)] 95.1 kg (209 lb 10.5 oz) (01/07 0405) HEMODYNAMICS:   VENTILATOR SETTINGS:   INTAKE / OUTPUT: Intake/Output     01/06 0701 - 01/07 0700 01/07 0701 - 01/08 0700   P.O. 360 480   Total Intake(mL/kg) 360 (3.8) 480 (5)   Urine (mL/kg/hr) 1350 (0.6)    Total Output 1350     Net -990 +480        Urine Occurrence 1 x    Stool Occurrence 2 x      PHYSICAL EXAMINATION: General: Obese WF NAD at rest on O2 Neuro:  Intact HEENT:  No LAN/JVD Cardiovascular:  HSR RRR Lungs:  CTA Abdomen:  Obese +bs Musculoskeletal:  Intact Skin:  Rt foot healing cat scratch mark noted, + le edema (better with IV lasix)  LABS:  CBC  Recent Labs Lab 09/02/13 0515 09/02/13 0528  WBC 7.7  --   HGB 12.0 12.6  HCT 35.2* 37.0  PLT 319  --    Coag's No results found for this basename: APTT, INR,  in the last 168 hours BMET  Recent Labs Lab 09/02/13 0528  NA 137  K 4.0  CL 100  BUN 18  CREATININE 0.70  GLUCOSE 174*   Electrolytes No results found for this basename: CALCIUM, MG, PHOS,  in the last 168 hours Sepsis Markers No results found for this basename: LATICACIDVEN, PROCALCITON, O2SATVEN,  in the last 168 hours ABG No results found for this basename: PHART, PCO2ART, PO2ART,  in the last 168 hours Liver Enzymes No results found for this basename: AST, ALT, ALKPHOS, BILITOT, ALBUMIN,  in the last 168 hours Cardiac Enzymes  Recent Labs Lab 09/02/13 0515  PROBNP 91.1   Glucose  Recent Labs Lab 09/02/13 1158 09/02/13 1712 09/02/13 2128 09/03/13 0729  GLUCAP 147* 196* 261* 167*    Imaging Dg Chest 2 View  09/02/2013   CLINICAL DATA:  Shortness of breath  EXAM: CHEST  2 VIEW  COMPARISON:  11/18/2009  FINDINGS: Chronic cardiomegaly. Diffuse interstitial prominence. Streaky lower lung  opacities, likely a combination of mild atelectasis and scar. Question trace left effusion.  IMPRESSION: Interstitial coarsening above baseline, which could be from airway infection or developing edema.   Electronically Signed   By: Jorje Guild M.D.   On: 09/02/2013 03:16   Ct Angio Chest Pe W/cm &/or Wo Cm  09/02/2013   CLINICAL DATA:  53 year old female with increasing shortness of Breath. Cardiac diastolic dysfunction. Abnormal D-dimer. Initial encounter.  EXAM: CT ANGIOGRAPHY CHEST WITH CONTRAST  TECHNIQUE: Multidetector CT imaging of the chest was performed using the standard protocol during bolus administration of intravenous contrast. Multiplanar CT image reconstructions  including MIPs were obtained to evaluate the vascular anatomy.  CONTRAST:  100 mL Omnipaque 300.  COMPARISON:  Chest radiographs from the same day reported separately.  FINDINGS: Adequate contrast bolus timing in the pulmonary arterial tree.  No focal filling defect identified in the pulmonary arterial tree to suggest the presence of acute pulmonary embolism.  In the left lower lung affecting both the lingula and anterior basal segment of the lower lobe there is a mass like opacity measuring 40 x 31 x 25 mm, affecting both sides of the major fissure (series 7 image when 126).  There are numerous superimposed solid to the semi solid pulmonary nodules throughout both lungs. Most measure 5-6 mm diameter, the largest are 10-11 mm. These head indistinct margins, resembling ground-glass. Superimposed bilateral mosaic attenuation. Additional more confluent opacity in both costophrenic sulci and dependently along the lateral right major fissure. Superimposed curvilinear probable scarring in the left apex. Major airways are patent.  No pericardial or pleural effusion, but there is mediastinal lymphadenopathy in the form of increased size and number of nodes, measuring up to 14 mm short axis individually. Bilateral hilar nodes are less affected.  There is cardiomegaly.  Negative visualized liver dome and spleen. Thyromegaly with only a subcentimeter right lobe thyroid nodule visible, too small to characterize, but most likely benign in the absence of known clinical risk factors for thyroid carcinoma.  Visible aorta and great vessels are patent. No definite aortic atherosclerosis.  No acute osseous abnormality identified.  Review of the MIP images confirms the above findings.  IMPRESSION: 1.  No evidence of acute pulmonary embolus. 2. Abnormal lungs with numerous indistinct bilateral pulmonary nodules (semi-solid appearing), with superimposed confluent mass like pulmonary opacity in the left lower lung crossing the major fissure. Favor disseminated infection rather than neoplastic/metastatic disorder. Consider bacterial, fungal, and atypical etiologies (including invasive aspergillosis if the patient is immunocompromised). 3. Mediastinal lymphadenopathy, favor reactive.  Study discussed by telephone with APRIL [facility] on 09/02/2013 at 07:31. We discussed contact precautions and further infectious workup.   Electronically Signed   By: Lars Pinks M.D.   On: 09/02/2013 07:37     ASSESSMENT / PLAN:  PULMONARY A:  1 - Hypoxic Resp failure in setting of volume overload (better with lasix), Tobacco use, abnormal CT. No evidence PE on the CT scan 2- Abnormal CT scan chest . Bilateral semisolid nodules with a more confluent consolidated lesion LLL. DDx includes auto-immune process, atypical infxn, less likely malignancy.  P:   O2 as needed Will need full PFT, will try to get these today 1/7 Will check serological studies to r/o autoimmune processes FOB on 1/8 for BAL, brushings, biopsies Further cardiological work up in future Better control of Htn/DM should be beneficial   Richardson Landry Minor ACNP Maryanna Shape PCCM Pager [phone] till 3 pm If no answer page [phone] 09/03/2013, 11:13 AM  Baltazar Apo, MD, PhD 09/03/2013, 11:23 AM [facility] [phone] or if no answer [phone]

## 2013-09-02 NOTE — ED Notes (Addendum)
Pt states that she was dx with diastolic dysfunction in October 14 but recently she has been feeling more SOB especially with walking into the building to work. States she takes HCTZ/lisinopril. 82% RA upon exertion/walking to ER. No recent illness.

## 2013-09-02 NOTE — Progress Notes (Signed)
UR completed 

## 2013-09-02 NOTE — ED Notes (Signed)
MD at bedside. 

## 2013-09-02 NOTE — ED Notes (Signed)
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 Her O2 sats were 82 when she arrived in triage

## 2013-09-02 NOTE — ED Provider Notes (Signed)
CSN: [id number]     Arrival date & time 09/02/13  0242 History   First MD Initiated Contact with Patient 09/02/13 0441     Chief Complaint  Patient presents with  . Shortness of Breath   (Consider location/radiation/quality/duration/timing/severity/associated sxs/prior Treatment) Patient is a 53 y.o. female presenting with shortness of breath. The history is provided by the patient.  Shortness of Breath Severity:  Severe Onset quality:  Gradual Timing:  Constant Progression:  Worsening Chronicity:  New Context: activity   Context comment:  Walking.  No orthopnea no PND.  Has leg swelling it isn't new.  No long car trips or plane trips Relieved by:  Nothing Worsened by:  Nothing tried Ineffective treatments:  None tried Associated symptoms: no abdominal pain, no chest pain, no cough, no diaphoresis, no fever and no wheezing   Risk factors: no recent surgery     Past Medical History  Diagnosis Date  . Hypertension   . Diabetes mellitus without complication   . GERD (gastroesophageal reflux disease)   . Arthritis   . Diastolic dysfunction     followed by dr polite found on echo 06-12-13 , grade 1 per pt   Past Surgical History  Procedure Laterality Date  . Cholecystectomy  1996  . Wisdom tooth extraction  yrs ago  . All teeth extraction  7-8 yrs ago  . Colonoscopy with propofol N/A 08/05/2013    Procedure: COLONOSCOPY WITH PROPOFOL;  Surgeon: Garlan Fair, MD;  Location: [facility];  Service: Endoscopy;  Laterality: N/A;   No family history on file. History  Substance Use Topics  . Smoking status: Former Smoker -- 0.50 packs/day for 25 years    Types: Cigarettes    Quit date: 09/22/2011  . Smokeless tobacco: Never Used  . Alcohol Use: Yes     Comment: very rare   OB History   Grav Para Term Preterm Abortions TAB SAB Ect Mult Living                 Review of Systems  Constitutional: Negative for fever and diaphoresis.  Respiratory: Positive for shortness of  breath. Negative for cough, chest tightness and wheezing.   Cardiovascular: Positive for leg swelling. Negative for chest pain and palpitations.  Gastrointestinal: Negative for abdominal pain.  All other systems reviewed and are negative.    Allergies  Metformin and related and Sulfa antibiotics  Home Medications   Current Outpatient Rx  Name  Route  Sig  Dispense  Refill  . aspirin EC 81 MG tablet   Oral   Take 81 mg by mouth daily.         [name] atorvastatin (LIPITOR) 10 MG tablet   Oral   Take 10 mg by mouth at bedtime.         . diphenhydrAMINE (BENADRYL) 25 mg capsule   Oral   Take 25 mg by mouth every 6 (six) hours as needed for sleep.         [name] ibuprofen (ADVIL,MOTRIN) 200 MG tablet   Oral   Take 800 mg by mouth every 6 (six) hours as needed (pain).         . insulin glargine (LANTUS) 100 UNIT/ML injection   Subcutaneous   Inject 30 Units into the skin at bedtime.         [name] lisinopril-hydrochlorothiazide (PRINZIDE,ZESTORETIC) 10-12.5 MG per tablet   Oral   Take 1 tablet by mouth at bedtime.         [name]  Multiple Vitamins-Minerals (CENTRUM ADULTS) TABS   Oral   Take 1 tablet by mouth daily.         . sitaGLIPtin (JANUVIA) 100 MG tablet   Oral   Take 100 mg by mouth at bedtime.          BP 147/55  Pulse 92  Temp(Src) 99 F (37.2 C) (Oral)  SpO2 93%  LMP 08/28/2008 Physical Exam  Constitutional: She is oriented to person, place, and time. She appears well-developed and well-nourished. No distress.  HENT:  Head: Normocephalic and atraumatic.  Mouth/Throat: Oropharynx is clear and moist.  Eyes: Conjunctivae are normal. Pupils are equal, round, and reactive to light.  Neck: Normal range of motion. Neck supple.  Cardiovascular: Normal rate, regular rhythm and intact distal pulses.   Pulmonary/Chest: No stridor. No respiratory distress. She has wheezes. She exhibits no tenderness.  Abdominal: Soft. Bowel sounds are normal. There is no tenderness.  There is no rebound and no guarding.  Musculoskeletal: She exhibits edema. She exhibits no tenderness.  Neurological: She is alert and oriented to person, place, and time.  Skin: Skin is warm and dry.  Psychiatric: She has a normal mood and affect.    ED Course  Procedures (including critical care time) Labs Review Labs Reviewed  CBC WITH DIFFERENTIAL - Abnormal; Notable for the following:    HCT 35.2 (*)    RDW 15.8 (*)    All other components within normal limits  D-DIMER, QUANTITATIVE - Abnormal; Notable for the following:    D-Dimer, Quant 1.24 (*)    All other components within normal limits  POCT I-STAT, CHEM 8 - Abnormal; Notable for the following:    Glucose, Bld 174 (*)    Calcium, Ion 1.33 (*)    All other components within normal limits  PRO B NATRIURETIC PEPTIDE  POCT I-STAT TROPONIN I   Imaging Review Dg Chest 2 View  09/02/2013   CLINICAL DATA:  Shortness of breath  EXAM: CHEST  2 VIEW  COMPARISON:  11/18/2009  FINDINGS: Chronic cardiomegaly. Diffuse interstitial prominence. Streaky lower lung opacities, likely a combination of mild atelectasis and scar. Question trace left effusion.  IMPRESSION: Interstitial coarsening above baseline, which could be from airway infection or developing edema.   Electronically Signed   By: Jorje Guild M.D.   On: 09/02/2013 03:16    EKG Interpretation    Date/Time:  Tuesday September 02 2013 05:01:15 EST Ventricular Rate:  90 PR Interval:  148 QRS Duration: 96 QT Interval:  362 QTC Calculation: 443 R Axis:   69 Text Interpretation:  Sinus rhythm Confirmed by [facility]  MD, Joriel Streety ([id number]) on 09/02/2013 5:07:14 AM            MDM  No diagnosis found. MDM Number of Diagnoses or Management Options Hypoxia:  MDM Reviewed: nursing note and vitals Interpretation: labs, ECG and x-ray Consults: admitting MD    ,    Analese Sovine K Veronnica Hennings-Rasch, MD 09/03/13 [phone]

## 2013-09-02 NOTE — ED Notes (Addendum)
Critical Care NP at bedside 

## 2013-09-02 NOTE — ED Notes (Signed)
Bed: [id number] Expected date:  Expected time:  Means of arrival:  Comments: Hold for 23

## 2013-09-03 DIAGNOSIS — J9600 Acute respiratory failure, unspecified whether with hypoxia or hypercapnia: Principal | ICD-10-CM

## 2013-09-03 DIAGNOSIS — R918 Other nonspecific abnormal finding of lung field: Secondary | ICD-10-CM

## 2013-09-03 DIAGNOSIS — I519 Heart disease, unspecified: Secondary | ICD-10-CM

## 2013-09-03 LAB — GLUCOSE, CAPILLARY
Glucose-Capillary: 167 mg/dL — ABNORMAL HIGH (ref 70–99)
Glucose-Capillary: 206 mg/dL — ABNORMAL HIGH (ref 70–99)
Glucose-Capillary: 256 mg/dL — ABNORMAL HIGH (ref 70–99)
Glucose-Capillary: 258 mg/dL — ABNORMAL HIGH (ref 70–99)

## 2013-09-03 LAB — HEMOGLOBIN A1C
HEMOGLOBIN A1C: 9 % — AB (ref <5.7)
Mean Plasma Glucose: 212 mg/dL — ABNORMAL HIGH (ref <117)

## 2013-09-03 LAB — HIV ANTIBODY (ROUTINE TESTING W REFLEX): HIV: NONREACTIVE

## 2013-09-03 LAB — EOSINOPHIL COUNT: Eosinophils Absolute: 0.4 10*3/uL (ref 0.0–0.7)

## 2013-09-03 LAB — RHEUMATOID FACTOR: Rhuematoid fact SerPl-aCnc: <10 IU/mL (ref <=14)

## 2013-09-03 MED ORDER — POTASSIUM CHLORIDE CRYS ER 10 MEQ PO TBCR
10.0000 meq | EXTENDED_RELEASE_TABLET | Freq: Every day | ORAL | Status: DC
  Administered 2013-09-03 – 2013-09-05 (×3): 10 meq via ORAL
  Filled 2013-09-03 (×3): qty 1

## 2013-09-03 MED ORDER — FUROSEMIDE 40 MG PO TABS
40.0000 mg | ORAL_TABLET | Freq: Every day | ORAL | Status: DC
  Administered 2013-09-03 – 2013-09-05 (×3): 40 mg via ORAL
  Filled 2013-09-03 (×3): qty 1

## 2013-09-03 NOTE — Progress Notes (Signed)
Off O2 at rest O2 Sats94% Off O2 post ambulation O2 Sats 79% O2 on post ambulation O2 Sats  92%

## 2013-09-03 NOTE — Progress Notes (Signed)
Subjective: Patient states she feels better. Dyspnea much better since her Lasix. Admission H&P reviewed, abnormal CT noted. Pulmonary input greatly appreciated plans for bronchoscopy tomorrow.  Objective: Vital signs in last 24 hours: Temp:  [97.9 F (36.6 C)-98.5 F (36.9 C)] 97.9 F (36.6 C) (01/07 0405) Pulse Rate:  [74-82] 74 (01/07 0405) Resp:  [20] 20 (01/07 0405) BP: (112-146)/(50-63) 112/56 mmHg (01/07 0405) SpO2:  [85 %-99 %] 99 % (01/07 0405) Weight:  [95.1 kg (209 lb 10.5 oz)] 95.1 kg (209 lb 10.5 oz) (01/07 0405) Weight change:  Last BM Date: 09/02/13  Intake/Output from previous day: 01/06 0701 - 01/07 0700 In: 360 [P.O.:360] Out: 1350 [Urine:1350] Intake/Output this shift: Total I/O In: 480 [P.O.:480] Out: -   General appearance: alert and cooperative Resp: clear to auscultation bilaterally Cardio: regular rate and rhythm, S1, S2 normal, no murmur, click, rub or gallop Extremities: extremities normal, atraumatic, no cyanosis or edema  Lab Results:  Results for orders placed during the hospital encounter of 09/02/13 (from the past 24 hour(s))  GLUCOSE, CAPILLARY     Status: Abnormal   Collection Time    09/02/13  5:12 PM      Result Value Range   Glucose-Capillary 196 (*) 70 - 99 mg/dL   Comment 1 Notify RN    GLUCOSE, CAPILLARY     Status: Abnormal   Collection Time    09/02/13  9:28 PM      Result Value Range   Glucose-Capillary 261 (*) 70 - 99 mg/dL  GLUCOSE, CAPILLARY     Status: Abnormal   Collection Time    09/03/13  7:29 AM      Result Value Range   Glucose-Capillary 167 (*) 70 - 99 mg/dL      Studies/Results: Dg Chest 2 View  09/02/2013   CLINICAL DATA:  Shortness of breath  EXAM: CHEST  2 VIEW  COMPARISON:  11/18/2009  FINDINGS: Chronic cardiomegaly. Diffuse interstitial prominence. Streaky lower lung opacities, likely a combination of mild atelectasis and scar. Question trace left effusion.  IMPRESSION: Interstitial coarsening above  baseline, which could be from airway infection or developing edema.   Electronically Signed   By: Jorje Guild M.D.   On: 09/02/2013 03:16   Ct Angio Chest Pe W/cm &/or Wo Cm  09/02/2013   CLINICAL DATA:  53 year old female with increasing shortness of Breath. Cardiac diastolic dysfunction. Abnormal D-dimer. Initial encounter.  EXAM: CT ANGIOGRAPHY CHEST WITH CONTRAST  TECHNIQUE: Multidetector CT imaging of the chest was performed using the standard protocol during bolus administration of intravenous contrast. Multiplanar CT image reconstructions including MIPs were obtained to evaluate the vascular anatomy.  CONTRAST:  100 mL Omnipaque 300.  COMPARISON:  Chest radiographs from the same day reported separately.  FINDINGS: Adequate contrast bolus timing in the pulmonary arterial tree.  No focal filling defect identified in the pulmonary arterial tree to suggest the presence of acute pulmonary embolism.  In the left lower lung affecting both the lingula and anterior basal segment of the lower lobe there is a mass like opacity measuring 40 x 31 x 25 mm, affecting both sides of the major fissure (series 7 image when 126).  There are numerous superimposed solid to the semi solid pulmonary nodules throughout both lungs. Most measure 5-6 mm diameter, the largest are 10-11 mm. These head indistinct margins, resembling ground-glass. Superimposed bilateral mosaic attenuation. Additional more confluent opacity in both costophrenic sulci and dependently along the lateral right major fissure. Superimposed curvilinear probable scarring  in the left apex. Major airways are patent.  No pericardial or pleural effusion, but there is mediastinal lymphadenopathy in the form of increased size and number of nodes, measuring up to 14 mm short axis individually. Bilateral hilar nodes are less affected. There is cardiomegaly.  Negative visualized liver dome and spleen. Thyromegaly with only a subcentimeter right lobe thyroid nodule  visible, too small to characterize, but most likely benign in the absence of known clinical risk factors for thyroid carcinoma.  Visible aorta and great vessels are patent. No definite aortic atherosclerosis.  No acute osseous abnormality identified.  Review of the MIP images confirms the above findings.  IMPRESSION: 1.  No evidence of acute pulmonary embolus. 2. Abnormal lungs with numerous indistinct bilateral pulmonary nodules (semi-solid appearing), with superimposed confluent mass like pulmonary opacity in the left lower lung crossing the major fissure. Favor disseminated infection rather than neoplastic/metastatic disorder. Consider bacterial, fungal, and atypical etiologies (including invasive aspergillosis if the patient is immunocompromised). 3. Mediastinal lymphadenopathy, favor reactive.  Study discussed by telephone with APRIL Hughes [facility] on 09/02/2013 at 07:31. We discussed contact precautions and further infectious workup.   Electronically Signed   By: Lars Pinks M.D.   On: 09/02/2013 07:37   2D Echo  05/2013  Left ventricle: The cavity size was normal. Wall thickness was increased in a pattern of mild LVH. Systolic function was normal. The estimated ejection fraction was in the range of 60% to 65%. Wall motion was normal; there were no regional wall motion abnormalities. Doppler parameters are consistent with abnormal left ventricular relaxation (grade 1 diastolic dysfunction).  Medications:  Prior to Admission:  Prescriptions prior to admission  Medication Sig Dispense Refill  . aspirin EC 81 MG tablet Take 81 mg by mouth daily.      [name] atorvastatin (LIPITOR) 10 MG tablet Take 10 mg by mouth at bedtime.      . diphenhydrAMINE (BENADRYL) 25 mg capsule Take 25 mg by mouth every 6 (six) hours as needed for sleep.      [name] ibuprofen (ADVIL,MOTRIN) 200 MG tablet Take 800 mg by mouth every 6 (six) hours as needed (pain).      . insulin glargine (LANTUS) 100 UNIT/ML injection Inject 30 Units  into the skin at bedtime.      [name] lisinopril-hydrochlorothiazide (PRINZIDE,ZESTORETIC) 10-12.5 MG per tablet Take 1 tablet by mouth at bedtime.      . Multiple Vitamins-Minerals (CENTRUM ADULTS) TABS Take 1 tablet by mouth daily.      . sitaGLIPtin (JANUVIA) 100 MG tablet Take 100 mg by mouth at bedtime.       Scheduled: . aspirin EC  81 mg Oral Daily  . atorvastatin  10 mg Oral q1800  . hydrochlorothiazide  12.5 mg Oral q1800   And  . lisinopril  10 mg Oral q1800  . insulin aspart  0-5 Units Subcutaneous QHS  . insulin aspart  0-9 Units Subcutaneous TID WC  . insulin glargine  30 Units Subcutaneous QHS  . multivitamin with minerals  1 tablet Oral Daily  . sodium chloride  3 mL Intravenous Q12H  . sodium chloride  3 mL Intravenous Q12H   Continuous:  [id number] chloride, acetaminophen, albuterol, diphenhydrAMINE, ibuprofen, ondansetron (ZOFRAN) IV, ondansetron, sodium chloride  Assessment/Plan: Dyspnea multifactorial. Abnormal CT as well as diastolic dysfunction. Patient describes significant relief after IV Lasix. BNP not  elevated, recent echo as discussed above. Await further input from pulmonary i.e. Bronchoscopy and labs, at daily Lasix. Within normal BNP not  certain how much to diastolic dysfunction is contributing to the above picture.  Known diastolic dysfunction, see above results.  Diabetes recently started on treatment, historically poor control because of noncompliance. Last A1c in November was 10 which had improved from 12.continue meds as outlined  Hypertension, stable  Obesity  Leg edema partially related to diastolic dysfunction however patient has been taking chronic NSAIDs which she has been advised to avoid.in the past leg edema improved with discontinuation of NSAIDs   LOS: 1 day   Filippo Puls D 09/03/2013, 12:43 PM

## 2013-09-03 NOTE — Progress Notes (Addendum)
Inpatient Diabetes Program Recommendations  AACE/ADA: New Consensus Statement on Inpatient Glycemic Control (2013)  Target Ranges:  Prepandial:   less than 140 mg/dL      Peak postprandial:   less than 180 mg/dL (1-2 hours)      Critically ill patients:  140 - 180 mg/dL   Reason for Visit: Hyperglycemia  Results for Marissa Wilkerson, Marissa Wilkerson (MRN [id number]) as of 09/03/2013 11:28  Ref. Range 09/02/2013 11:58 09/02/2013 17:12 09/02/2013 21:28 09/03/2013 07:29  Glucose-Capillary Latest Range: 70-99 mg/dL 147 (H) 196 (H) 261 (H) 167 (H)    Inpatient Diabetes Program Recommendations Insulin - Basal: Lantus 30 units QHS (home dose) Correction (SSI): Increase Novolog to moderate tidwc and hs Insulin - Meal Coverage: May need meal coverage insulin when po intake increases HgbA1C: Check HgbA1C to assess glycemic control prior to hospitalization Outpatient Referral: Referral to [facility] would be beneficial Diet: CHO mod med  Note: Will continue to follow while inpatient. Thank you. Lorenda Peck, RD, LDN, CDE Inpatient Diabetes Coordinator [phone]

## 2013-09-04 ENCOUNTER — Inpatient Hospital Stay (HOSPITAL_COMMUNITY): Payer: 59

## 2013-09-04 ENCOUNTER — Encounter (HOSPITAL_COMMUNITY)
Admission: EM | Disposition: A | Discharge status: Home / Self Care | Payer: Self-pay | Source: Home / Self Care | Attending: Internal Medicine

## 2013-09-04 ENCOUNTER — Encounter (HOSPITAL_COMMUNITY): Payer: Self-pay | Admitting: Respiratory Therapy

## 2013-09-04 DIAGNOSIS — R918 Other nonspecific abnormal finding of lung field: Secondary | ICD-10-CM

## 2013-09-04 HISTORY — PX: VIDEO BRONCHOSCOPY: SHX5072

## 2013-09-04 LAB — GLUCOSE, CAPILLARY
GLUCOSE-CAPILLARY: 160 mg/dL — AB (ref 70–99)
Glucose-Capillary: 181 mg/dL — ABNORMAL HIGH (ref 70–99)
Glucose-Capillary: 187 mg/dL — ABNORMAL HIGH (ref 70–99)

## 2013-09-04 LAB — IGE: IgE (Immunoglobulin E), Serum: 55.3 IU/mL (ref 0.0–180.0)

## 2013-09-04 LAB — BASIC METABOLIC PANEL
BUN: 26 mg/dL — ABNORMAL HIGH (ref 6–23)
CO2: 25 meq/L (ref 19–32)
Calcium: 9.5 mg/dL (ref 8.4–10.5)
Chloride: 98 mEq/L (ref 96–112)
Creatinine, Ser: 0.79 mg/dL (ref 0.50–1.10)
GFR calc Af Amer: >90 mL/min (ref >90)
GFR calc non Af Amer: >90 mL/min (ref >90)
GLUCOSE: 246 mg/dL — AB (ref 70–99)
POTASSIUM: 4.3 meq/L (ref 3.7–5.3)
Sodium: 136 mEq/L — ABNORMAL LOW (ref 137–147)

## 2013-09-04 LAB — ANA: Anti Nuclear Antibody(ANA): NEGATIVE

## 2013-09-04 LAB — ANCA SCREEN W REFLEX TITER
Atypical p-ANCA Screen: NEGATIVE
c-ANCA Screen: NEGATIVE
p-ANCA Screen: NEGATIVE

## 2013-09-04 SURGERY — BRONCHOSCOPY, WITH FLUOROSCOPY
Anesthesia: Moderate Sedation | Laterality: Bilateral

## 2013-09-04 MED ORDER — PHENYLEPHRINE HCL 0.25 % NA SOLN
NASAL | Status: DC | PRN
Start: 2013-09-04 — End: 2013-09-04
  Administered 2013-09-04: 1 via NASAL

## 2013-09-04 MED ORDER — PHENYLEPHRINE HCL 0.25 % NA SOLN
1.0000 | Freq: Four times a day (QID) | NASAL | Status: DC | PRN
  Filled 2013-09-04: qty 15

## 2013-09-04 MED ORDER — LIDOCAINE HCL 2 % EX GEL
Freq: Once | CUTANEOUS | Status: AC
  Administered 2013-09-04: 1 via TOPICAL

## 2013-09-04 MED ORDER — MIDAZOLAM HCL 10 MG/2ML IJ SOLN
INTRAMUSCULAR | Status: DC | PRN
  Administered 2013-09-04: 1 mg via INTRAVENOUS
  Administered 2013-09-04: 2 mg via INTRAVENOUS
  Administered 2013-09-04: 3 mg via INTRAVENOUS
  Administered 2013-09-04: 1 mg via INTRAVENOUS

## 2013-09-04 MED ORDER — FENTANYL CITRATE 0.05 MG/ML IJ SOLN
INTRAMUSCULAR | Status: AC
  Filled 2013-09-04: qty 4

## 2013-09-04 MED ORDER — LIDOCAINE HCL 2 % EX GEL
CUTANEOUS | Status: DC | PRN
  Administered 2013-09-04: 1

## 2013-09-04 MED ORDER — MIDAZOLAM HCL 10 MG/2ML IJ SOLN
INTRAMUSCULAR | Status: AC
  Filled 2013-09-04: qty 4

## 2013-09-04 MED ORDER — LIDOCAINE HCL 1 % IJ SOLN
INTRAMUSCULAR | Status: DC | PRN
  Administered 2013-09-04: 4 mg via RESPIRATORY_TRACT

## 2013-09-04 MED ORDER — SODIUM CHLORIDE 0.9 % IV SOLN
INTRAVENOUS | Status: DC
  Administered 2013-09-04: 08:00:00 via INTRAVENOUS

## 2013-09-04 MED ORDER — FENTANYL CITRATE 0.05 MG/ML IJ SOLN
INTRAMUSCULAR | Status: DC | PRN
  Administered 2013-09-04: 25 ug via INTRAVENOUS
  Administered 2013-09-04 (×2): 50 ug via INTRAVENOUS

## 2013-09-04 NOTE — Progress Notes (Signed)
Came to visit patient at bedside on behalf of [employer] program for [employer] employees/dependents with [employer]. Discussed program and she expressed interest. Will contact post hospital discharge and will request patient to be called for an appointment for the [employer] to Wellness program. Left [employer] to Wellness brochure, packet, and contact information at bedside. Confirmed contact number. Appreciative of visit.  Marthenia Rolling, [facility] Liaison657 [phone]

## 2013-09-04 NOTE — Progress Notes (Signed)
Subjective: Patient doing well, she was seen in bronchoscopy lab. She states her oxygen level dropped last night when she was on nasal cannula oxygen. Saturations in the computer are within normal limits with 2 L of oxygen.  Objective: Vital signs in last 24 hours: Temp:  [97.9 F (36.6 C)-98.1 F (36.7 C)] 98.1 F (36.7 C) (01/08 0516) Pulse Rate:  [70-93] 73 (01/08 1100) Resp:  [12-29] 13 (01/08 0948) BP: (87-132)/(42-71) 108/49 mmHg (01/08 1100) SpO2:  [93 %-99 %] 93 % (01/08 1100) Weight:  [95 kg (209 lb 7 oz)] 95 kg (209 lb 7 oz) (01/08 0516) Weight change: 0.606 kg (1 lb 5.4 oz) Last BM Date: 09/02/13  Intake/Output from previous day: 01/07 0701 - 01/08 0700 In: 3540 [P.O.:3540] Out: 1800 [Urine:1800] Intake/Output this shift: Total I/O In: -  Out: 350 [Urine:350]  General appearance: alert and cooperative Resp: clear to auscultation bilaterally Cardio: regular rate and rhythm, S1, S2 normal, no murmur, click, rub or gallop  Lab Results:  Results for orders placed during the hospital encounter of 09/02/13 (from the past 24 hour(s))  RHEUMATOID FACTOR     Status: None   Collection Time    09/03/13  1:31 PM      Result Value Range   Rheumatoid Factor <10  <=14 IU/mL  ANA     Status: None   Collection Time    09/03/13  1:31 PM      Result Value Range   ANA NEGATIVE  NEGATIVE  IGE     Status: None   Collection Time    09/03/13  1:31 PM      Result Value Range   IgE (Immunoglobulin E), Serum 55.3  0.0 - 180.0 IU/mL  EOSINOPHIL COUNT     Status: None   Collection Time    09/03/13  1:31 PM      Result Value Range   Eosinophils Absolute 0.4  0.0 - 0.7 K/uL  HIV ANTIBODY (ROUTINE TESTING)     Status: None   Collection Time    09/03/13  1:31 PM      Result Value Range   HIV NON REACTIVE  NON REACTIVE  HEMOGLOBIN A1C     Status: Abnormal   Collection Time    09/03/13  1:32 PM      Result Value Range   Hemoglobin A1C 9.0 (*) <5.7 %   Mean Plasma Glucose 212  (*) <117 mg/dL  GLUCOSE, CAPILLARY     Status: Abnormal   Collection Time    09/03/13  5:47 PM      Result Value Range   Glucose-Capillary 256 (*) 70 - 99 mg/dL  GLUCOSE, CAPILLARY     Status: Abnormal   Collection Time    09/03/13  9:34 PM      Result Value Range   Glucose-Capillary 258 (*) 70 - 99 mg/dL  BASIC METABOLIC PANEL     Status: Abnormal   Collection Time    09/04/13  5:12 AM      Result Value Range   Sodium 136 (*) 137 - 147 mEq/L   Potassium 4.3  3.7 - 5.3 mEq/L   Chloride 98  96 - 112 mEq/L   CO2 25  19 - 32 mEq/L   Glucose, Bld 246 (*) 70 - 99 mg/dL   BUN 26 (*) 6 - 23 mg/dL   Creatinine, Ser 0.79  0.50 - 1.10 mg/dL   Calcium 9.5  8.4 - 10.5 mg/dL   GFR calc non  Af Amer >90  >90 mL/min   GFR calc Af Amer >90  >90 mL/min  GLUCOSE, CAPILLARY     Status: Abnormal   Collection Time    09/04/13 11:14 AM      Result Value Range   Glucose-Capillary 160 (*) 70 - 99 mg/dL      Studies/Results: Dg Chest Port 1 View  09/04/2013   CLINICAL DATA:  Post bronchoscopy.  EXAM: PORTABLE CHEST - 1 VIEW  COMPARISON:  09/02/2013  FINDINGS: Negative for pneumothorax.  Patchy airspace disease in the left lung base is unchanged. No significant effusion. No new areas of consolidation.  IMPRESSION: Negative for pneumothorax post bronchoscopy.   Electronically Signed   By: Franchot Gallo M.D.   On: 09/04/2013 09:48    Medications:  Prior to Admission:  Prescriptions prior to admission  Medication Sig Dispense Refill  . aspirin EC 81 MG tablet Take 81 mg by mouth daily.      [name] atorvastatin (LIPITOR) 10 MG tablet Take 10 mg by mouth at bedtime.      . diphenhydrAMINE (BENADRYL) 25 mg capsule Take 25 mg by mouth every 6 (six) hours as needed for sleep.      [name] ibuprofen (ADVIL,MOTRIN) 200 MG tablet Take 800 mg by mouth every 6 (six) hours as needed (pain).      . insulin glargine (LANTUS) 100 UNIT/ML injection Inject 30 Units into the skin at bedtime.      [name]  lisinopril-hydrochlorothiazide (PRINZIDE,ZESTORETIC) 10-12.5 MG per tablet Take 1 tablet by mouth at bedtime.      . Multiple Vitamins-Minerals (CENTRUM ADULTS) TABS Take 1 tablet by mouth daily.      . sitaGLIPtin (JANUVIA) 100 MG tablet Take 100 mg by mouth at bedtime.       Scheduled: . aspirin EC  81 mg Oral Daily  . atorvastatin  10 mg Oral q1800  . furosemide  40 mg Oral Daily  . insulin aspart  0-5 Units Subcutaneous QHS  . insulin aspart  0-9 Units Subcutaneous TID WC  . insulin glargine  30 Units Subcutaneous QHS  . lisinopril  10 mg Oral q1800  . multivitamin with minerals  1 tablet Oral Daily  . potassium chloride  10 mEq Oral Daily  . sodium chloride  3 mL Intravenous Q12H  . sodium chloride  3 mL Intravenous Q12H   Continuous: . sodium chloride 20 mL/hr at 09/04/13 [id number]   [id number] chloride, acetaminophen, albuterol, diphenhydrAMINE, ibuprofen, ondansetron (ZOFRAN) IV, ondansetron, phenylephrine, sodium chloride  Assessment/Plan: Dyspnea multifactorial, see previous note for details, await results from bronchoscopy. Diastolic dysfunction, continue daily Lasix, followup basic metabolic panel. Diabetes A1c now 9, continues to improve when patient initially started treatment A1c was 12. Hypertension currently stable Will be obesity   LOS: 2 days   Kortny Lirette D 09/04/2013, 1:05 PM

## 2013-09-04 NOTE — Progress Notes (Signed)
Video Bronchoscopy done with good patient tolerance  Intervention bronchial washing  Intervention biopsy  Intervention brushing

## 2013-09-04 NOTE — H&P (View-Only) (Signed)
Name: Marissa Wilkerson MRN: [id number] DOB: 03/20/1961    ADMISSION DATE:  09/02/2013 CONSULTATION DATE:  1-6  REFERRING MD :  [facility]ad PRIMARY SERVICE: [facility]  CHIEF COMPLAINT: SOB  SIGNIFICANT EVENTS / STUDIES:  TTE 06/12/13 >> normal LV size and fxn, grade 1 diastolic dysfxn, mild LA dilation, PASP 33mmHg,   HISTORY OF PRESENT ILLNESS:  53 yo wf(Obese) former smoker(quit 1 year ago) poorly controlled diabetic and HTN who presents to [facility] 1-6 with hypoxia(84% on RA). She has dyspnea that has been insidious over about 3-4 weeks.  She denies FCS or purulent sputum production.  No recent URI or unusual exposures or travels. 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 Of note she recently had loss of taste and smell attributed to uncontrolled DM. Further since sept. 2014 increased lower ext edema 4+ treated with HCTZ per Dr. Delfina Redwood. Following her presentation to [facility] CT angio revealed Abnormal lungs with numerous indistinct bilateral pulmonary nodules (semi-solid appearing), with superimposed confluent mass  like pulmonary opacity in the left lower lung crossing the major fissure. There was no PE. TTE from 10/14 showed no evidence of PAH or LV dysfxn.    PAST MEDICAL HISTORY :  Past Medical History  Diagnosis Date  . Hypertension   . Diabetes mellitus without complication   . GERD (gastroesophageal reflux disease)   . Arthritis   . Diastolic dysfunction     followed by dr polite found on echo 06-12-13 , grade 1 per pt  . 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 Dyslipidemia    Past Surgical History  Procedure Laterality Date  . Cholecystectomy  1996  . Wisdom tooth extraction  yrs ago  . All teeth extraction  7-8 yrs ago  . Colonoscopy with propofol N/A 08/05/2013    Procedure: COLONOSCOPY WITH PROPOFOL;  Surgeon: Garlan Fair, MD;  Location: [facility] ENDOSCOPY;  Service: Endoscopy;  Laterality: N/A;    Prior to Admission medications   Medication Sig Start Date End Date Taking? Authorizing Provider  aspirin EC 81 MG tablet Take 81 mg by mouth daily.   Yes Historical Provider, MD  atorvastatin (LIPITOR) 10 MG tablet Take 10 mg by mouth at bedtime.   Yes Historical Provider, MD  diphenhydrAMINE (BENADRYL) 25 mg capsule Take 25 mg by mouth every 6 (six) hours as needed for sleep.   Yes Historical Provider, MD  ibuprofen (ADVIL,MOTRIN) 200 MG tablet Take 800 mg by mouth every 6 (six) hours as needed (pain).   Yes Historical Provider, MD  insulin glargine (LANTUS) 100 UNIT/ML injection Inject 30 Units into the skin at bedtime.   Yes Historical Provider, MD  lisinopril-hydrochlorothiazide (PRINZIDE,ZESTORETIC) 10-12.5 MG per tablet Take 1 tablet by mouth at bedtime.   Yes Historical Provider, MD  Multiple Vitamins-Minerals (CENTRUM ADULTS) TABS Take 1 tablet by mouth daily.   Yes Historical Provider, MD  sitaGLIPtin (JANUVIA) 100 MG tablet Take 100 mg by mouth at bedtime.   Yes Historical Provider, MD   Allergies  Allergen Reactions  . Metformin And Related Nausea And Vomiting  . Sulfa Antibiotics Rash    FAMILY HISTORY:  Family History  Problem Relation Age of Onset  . Heart failure Mother   . Diabetes Father   . Hypertension Father   . Diabetes Brother   . Hypertension Brother    SOCIAL HISTORY:  reports that she  quit smoking about 1 years ago. Her smoking use included Cigarettes. She has a 12.5 pack-year smoking history. She has never used smokeless tobacco. She reports that she drinks alcohol. She reports that she does not use illicit drugs. Works as a Marine scientist. No significant travel or exposures.   REVIEW OF SYSTEMS:  10 point review of system taken, please see HPI for positives and negatives.   SUBJECTIVE:  Feels well as long as she is still. When walking she is SOB  VITAL SIGNS: Temp:  [97.9 F (36.6 C)-98.8 F (37.1 C)] 97.9 F (36.6 C) (01/07 0405) Pulse Rate:  [74-89]  74 (01/07 0405) Resp:  [20] 20 (01/07 0405) BP: (112-146)/(50-63) 112/56 mmHg (01/07 0405) SpO2:  [85 %-99 %] 99 % (01/07 0405) Weight:  [94.394 kg (208 lb 1.6 oz)-95.1 kg (209 lb 10.5 oz)] 95.1 kg (209 lb 10.5 oz) (01/07 0405) HEMODYNAMICS:   VENTILATOR SETTINGS:   INTAKE / OUTPUT: Intake/Output     01/06 0701 - 01/07 0700 01/07 0701 - 01/08 0700   P.O. 360 480   Total Intake(mL/kg) 360 (3.8) 480 (5)   Urine (mL/kg/hr) 1350 (0.6)    Total Output 1350     Net -990 +480        Urine Occurrence 1 x    Stool Occurrence 2 x      PHYSICAL EXAMINATION: General: Obese WF NAD at rest on O2 Neuro:  Intact HEENT:  No LAN/JVD Cardiovascular:  HSR RRR Lungs:  CTA Abdomen:  Obese +bs Musculoskeletal:  Intact Skin:  Rt foot healing cat scratch mark noted, + le edema (better with IV lasix)  LABS:  CBC  Recent Labs Lab 09/02/13 0515 09/02/13 0528  WBC 7.7  --   HGB 12.0 12.6  HCT 35.2* 37.0  PLT 319  --    Coag's No results found for this basename: APTT, INR,  in the last 168 hours BMET  Recent Labs Lab 09/02/13 0528  NA 137  K 4.0  CL 100  BUN 18  CREATININE 0.70  GLUCOSE 174*   Electrolytes No results found for this basename: CALCIUM, MG, PHOS,  in the last 168 hours Sepsis Markers No results found for this basename: LATICACIDVEN, PROCALCITON, O2SATVEN,  in the last 168 hours ABG No results found for this basename: PHART, PCO2ART, PO2ART,  in the last 168 hours Liver Enzymes No results found for this basename: AST, ALT, ALKPHOS, BILITOT, ALBUMIN,  in the last 168 hours Cardiac Enzymes  Recent Labs Lab 09/02/13 0515  PROBNP 91.1   Glucose  Recent Labs Lab 09/02/13 1158 09/02/13 1712 09/02/13 2128 09/03/13 0729  GLUCAP 147* 196* 261* 167*    Imaging Dg Chest 2 View  09/02/2013   CLINICAL DATA:  Shortness of breath  EXAM: CHEST  2 VIEW  COMPARISON:  11/18/2009  FINDINGS: Chronic cardiomegaly. Diffuse interstitial prominence. Streaky lower lung  opacities, likely a combination of mild atelectasis and scar. Question trace left effusion.  IMPRESSION: Interstitial coarsening above baseline, which could be from airway infection or developing edema.   Electronically Signed   By: Jorje Guild M.D.   On: 09/02/2013 03:16   Ct Angio Chest Pe W/cm &/or Wo Cm  09/02/2013   CLINICAL DATA:  53 year old female with increasing shortness of Breath. Cardiac diastolic dysfunction. Abnormal D-dimer. Initial encounter.  EXAM: CT ANGIOGRAPHY CHEST WITH CONTRAST  TECHNIQUE: Multidetector CT imaging of the chest was performed using the standard protocol during bolus administration of intravenous contrast. Multiplanar CT image reconstructions  including MIPs were obtained to evaluate the vascular anatomy.  CONTRAST:  100 mL Omnipaque 300.  COMPARISON:  Chest radiographs from the same day reported separately.  FINDINGS: Adequate contrast bolus timing in the pulmonary arterial tree.  No focal filling defect identified in the pulmonary arterial tree to suggest the presence of acute pulmonary embolism.  In the left lower lung affecting both the lingula and anterior basal segment of the lower lobe there is a mass like opacity measuring 40 x 31 x 25 mm, affecting both sides of the major fissure (series 7 image when 126).  There are numerous superimposed solid to the semi solid pulmonary nodules throughout both lungs. Most measure 5-6 mm diameter, the largest are 10-11 mm. These head indistinct margins, resembling ground-glass. Superimposed bilateral mosaic attenuation. Additional more confluent opacity in both costophrenic sulci and dependently along the lateral right major fissure. Superimposed curvilinear probable scarring in the left apex. Major airways are patent.  No pericardial or pleural effusion, but there is mediastinal lymphadenopathy in the form of increased size and number of nodes, measuring up to 14 mm short axis individually. Bilateral hilar nodes are less affected.  There is cardiomegaly.  Negative visualized liver dome and spleen. Thyromegaly with only a subcentimeter right lobe thyroid nodule visible, too small to characterize, but most likely benign in the absence of known clinical risk factors for thyroid carcinoma.  Visible aorta and great vessels are patent. No definite aortic atherosclerosis.  No acute osseous abnormality identified.  Review of the MIP images confirms the above findings.  IMPRESSION: 1.  No evidence of acute pulmonary embolus. 2. Abnormal lungs with numerous indistinct bilateral pulmonary nodules (semi-solid appearing), with superimposed confluent mass like pulmonary opacity in the left lower lung crossing the major fissure. Favor disseminated infection rather than neoplastic/metastatic disorder. Consider bacterial, fungal, and atypical etiologies (including invasive aspergillosis if the patient is immunocompromised). 3. Mediastinal lymphadenopathy, favor reactive.  Study discussed by telephone with APRIL [facility] on 09/02/2013 at 07:31. We discussed contact precautions and further infectious workup.   Electronically Signed   By: Lars Pinks M.D.   On: 09/02/2013 07:37     ASSESSMENT / PLAN:  PULMONARY A:  1 - Hypoxic Resp failure in setting of volume overload (better with lasix), Tobacco use, abnormal CT. No evidence PE on the CT scan 2- Abnormal CT scan chest . Bilateral semisolid nodules with a more confluent consolidated lesion LLL. DDx includes auto-immune process, atypical infxn, less likely malignancy.  P:   O2 as needed Will need full PFT, will try to get these today 1/7 Will check serological studies to r/o autoimmune processes FOB on 1/8 for BAL, brushings, biopsies Further cardiological work up in future Better control of Htn/DM should be beneficial   Richardson Landry Minor ACNP Maryanna Shape PCCM Pager [phone] till 3 pm If no answer page [phone] 09/03/2013, 11:13 AM  Baltazar Apo, MD, PhD 09/03/2013, 11:23 AM [facility] [phone] or if no answer [phone]

## 2013-09-04 NOTE — Op Note (Signed)
Video Bronchoscopy Procedure Note  Date of Operation: 09/04/2013  Pre-op Diagnosis: hypoxemia and bilateral pulmonary nodules  Post-op Diagnosis: Same  Surgeon: Baltazar Apo  Assistants: none  Anesthesia: conscious sedation, moderate sedation  Meds Given: fentanyl 163mcg, versed 7mg  in divided doses, 1% lidocaine 16cc total  Operation: Flexible video fiberoptic bronchoscopy and biopsies.  Estimated Blood Loss: [id number]  Complications: none noted  Indications and History: Marissa Wilkerson is 53 y.o. with history of dyspnea, hypoxemia, pulmonary nodules and a LLL opacity on CT chest.  Recommendation was to perform video fiberoptic bronchoscopy with biopsies. The risks, benefits, complications, treatment options and expected outcomes were discussed with the patient.  The possibilities of pneumothorax, pneumonia, reaction to medication, pulmonary aspiration, perforation of a viscus, bleeding, failure to diagnose a condition and creating a complication requiring transfusion or operation were discussed with the patient who freely signed the consent.    Description of Procedure: The patient was seen in the Preoperative Area, was examined and was deemed appropriate to proceed.  The patient was taken to [facility], identified as Marissa Wilkerson and the procedure verified as Flexible Video Fiberoptic Bronchoscopy.  A Time Out was held and the above information confirmed.   Conscious sedation was initiated as indicated above. The video fiberoptic bronchoscope was introduced via the R nare and a general inspection was performed which showed normal cords, normal trachea, normal main carina. The R sided airways were inspected and showed normal RUL, BI, RML and RLL. The L side was then inspected. The LLL, Lingular and LUL airways were normal.   Under fluorscopic guidance transbronchial brushings were taken from several subsegments of the LLL, transbronchial forceps biopsies were performed from the LLL.  There was some initial moderate bleeding that stopped quickly. Finally endobronchial washings were performed in the LLL to be sent for cytology. The patient tolerated the procedure well. The bronchoscope was removed. There were no obvious complications. A CXR is pending.   Samples: 1. Transbronchial brushings from LLL 2. Transbronchial forceps biopsies from LLL 3. Bronchoalveolar Lavage from LLL  Plans:  We will review the cytology, pathology and microbiology results with the patient when they become available.  Outpatient followup will be with Dr Lamonte Sakai.    Baltazar Apo, MD, PhD 09/04/2013, 9:12 AM [facility] [phone] or if no answer [phone]

## 2013-09-04 NOTE — Progress Notes (Signed)
Spoke with pt concerning home health needs. She selected [facility] for [place] needs if any.

## 2013-09-04 NOTE — Progress Notes (Signed)
Baltazar Apo, MD, PhD 09/04/2013, 9:57 AM [facility] [phone] or if no answer [phone]

## 2013-09-04 NOTE — Interval H&P Note (Signed)
PCCM Interval Note  S: No new issues o/n O: Filed Vitals:   09/03/13 1415 09/03/13 2130 09/04/13 0516 09/04/13 0753  BP: 126/54 127/56 126/66 132/71  Pulse: 90 84 85 85  Temp: 97.9 F (36.6 C) 98.1 F (36.7 C) 98.1 F (36.7 C)   TempSrc: Oral Oral Oral   Resp: 18 18 18 26   Height:      Weight:   95 kg (209 lb 7 oz)   SpO2: 97% 96% 95% 98%    Recent Labs Lab 09/02/13 0515 09/02/13 0528  HGB 12.0 12.6  HCT 35.2* 37.0  WBC 7.7  --   PLT 319  --    Plan:  No barriers to proceeding with FOB, BAL, bx's. Pt understands the procedure and risks, elects to proceed.   Baltazar Apo, MD, PhD 09/04/2013, 8:26 AM [facility] [phone] or if no answer [phone]

## 2013-09-05 ENCOUNTER — Encounter (HOSPITAL_COMMUNITY): Payer: Self-pay | Admitting: Emergency Medicine

## 2013-09-05 DIAGNOSIS — R0902 Hypoxemia: Secondary | ICD-10-CM

## 2013-09-05 LAB — BASIC METABOLIC PANEL
BUN: 25 mg/dL — AB (ref 6–23)
CO2: 25 mEq/L (ref 19–32)
Calcium: 9.3 mg/dL (ref 8.4–10.5)
Chloride: 97 mEq/L (ref 96–112)
Creatinine, Ser: 0.79 mg/dL (ref 0.50–1.10)
GFR calc non Af Amer: >90 mL/min (ref >90)
Glucose, Bld: 271 mg/dL — ABNORMAL HIGH (ref 70–99)
POTASSIUM: 4.1 meq/L (ref 3.7–5.3)
SODIUM: 135 meq/L — AB (ref 137–147)

## 2013-09-05 LAB — QUANTIFERON TB GOLD ASSAY (BLOOD)
Interferon Gamma Release Assay: NEGATIVE
Mitogen value: 1.95 IU/mL
QUANTIFERON NIL VALUE: 0.15 [IU]/mL
TB AG VALUE: 0.16 [IU]/mL
TB Antigen Minus Nil Value: 0.01 IU/mL

## 2013-09-05 LAB — GLUCOSE, CAPILLARY
Glucose-Capillary: 256 mg/dL — ABNORMAL HIGH (ref 70–99)
Glucose-Capillary: 161 mg/dL — ABNORMAL HIGH (ref 70–99)

## 2013-09-05 MED ORDER — POTASSIUM CHLORIDE CRYS ER 10 MEQ PO TBCR: 10.0000 meq | EXTENDED_RELEASE_TABLET | Freq: Every day | ORAL | Status: DC

## 2013-09-05 MED ORDER — FUROSEMIDE 40 MG PO TABS: 40.0000 mg | ORAL_TABLET | Freq: Every day | ORAL | Status: DC

## 2013-09-05 MED ORDER — LISINOPRIL 10 MG PO TABS: 10.0000 mg | ORAL_TABLET | Freq: Every day | ORAL | Status: DC

## 2013-09-05 NOTE — Progress Notes (Signed)
SATURATION QUALIFICATIONS: (This note is used to comply with regulatory documentation for home oxygen)  Patient Saturations on Room Air at Rest = 84%  Patient Saturations on Room Air while Ambulating = 84%  Patient Saturations on 2 Liters of oxygen while Ambulating = 87%  Please briefly explain why patient needs home oxygen:

## 2013-09-05 NOTE — Discharge Summary (Signed)
Physician Discharge Summary  Patient ID: Marissa Wilkerson MRN: [id number] DOB/AGE: 52-May-1962 52 y.o.  Admit date: 09/02/2013 Discharge date: 09/05/2013  Admission Diagnoses:  Discharge Diagnoses:  Principal Problem:   Acute respiratory failure with hypoxia Active Problems:   Hypertension   Diabetes mellitus without complication   Diastolic dysfunction   Pulmonary nodules/lesions, multiple   Discharged Condition: fair  Hospital Course:  Patient presented to the emergency room with complaint of shortness of breath. She is a nurse at the hospital, she felt short of breath, she checked her pulse ox it was low. In the emergency room she was evaluated. She has a known history of diastolic dysfunction, she was given Lasix and stated she felt a little better. She had a d-dimer that was elevated which prompted a CT of her chest which showed abnormalities. Admission was deemed necessary for further evaluation and treatment. Patient was admitted to a medical floor bed, she was given supplemental oxygen, she was seen promptly by pulmonary critical care. Patient underwent bronchoscopy, most of the studies are pending. With oxygen she remained hemodynamically stable. There is no complaint of chest pain shortness of breath palpitation diaphoresis. Daily Lasix was added because of her known diastolic dysfunction. Again as stated her BNP was normal, she had a echocardiogram in October of 2014.the etiology for her hypoxia is unknown at this time however patient is stable with nasal cannula oxygen.  In fact at rest as respiratory 90-92 %.Plans are for further outpatient management with pulmonary function tests, followup of studies and followup with  pulmonary critical care medicine.  Consults:  pulmonary critical care medicine  Significant Diagnostic Studies:Dg Chest 2 View  09/02/2013   CLINICAL DATA:  Shortness of breath  EXAM: CHEST  2 VIEW  COMPARISON:  11/18/2009  FINDINGS: Chronic cardiomegaly. Diffuse  interstitial prominence. Streaky lower lung opacities, likely a combination of mild atelectasis and scar. Question trace left effusion.  IMPRESSION: Interstitial coarsening above baseline, which could be from airway infection or developing edema.   Electronically Signed   By: Jorje Guild M.D.   On: 09/02/2013 03:16   Ct Angio Chest Pe W/cm &/or Wo Cm  09/02/2013   CLINICAL DATA:  53 year old female with increasing shortness of Breath. Cardiac diastolic dysfunction. Abnormal D-dimer. Initial encounter.  EXAM: CT ANGIOGRAPHY CHEST WITH CONTRAST  TECHNIQUE: Multidetector CT imaging of the chest was performed using the standard protocol during bolus administration of intravenous contrast. Multiplanar CT image reconstructions including MIPs were obtained to evaluate the vascular anatomy.  CONTRAST:  100 mL Omnipaque 300.  COMPARISON:  Chest radiographs from the same day reported separately.  FINDINGS: Adequate contrast bolus timing in the pulmonary arterial tree.  No focal filling defect identified in the pulmonary arterial tree to suggest the presence of acute pulmonary embolism.  In the left lower lung affecting both the lingula and anterior basal segment of the lower lobe there is a mass like opacity measuring 40 x 31 x 25 mm, affecting both sides of the major fissure (series 7 image when 126).  There are numerous superimposed solid to the semi solid pulmonary nodules throughout both lungs. Most measure 5-6 mm diameter, the largest are 10-11 mm. These head indistinct margins, resembling ground-glass. Superimposed bilateral mosaic attenuation. Additional more confluent opacity in both costophrenic sulci and dependently along the lateral right major fissure. Superimposed curvilinear probable scarring in the left apex. Major airways are patent.  No pericardial or pleural effusion, but there is mediastinal lymphadenopathy in the form of increased  size and number of nodes, measuring up to 14 mm short axis  individually. Bilateral hilar nodes are less affected. There is cardiomegaly.  Negative visualized liver dome and spleen. Thyromegaly with only a subcentimeter right lobe thyroid nodule visible, too small to characterize, but most likely benign in the absence of known clinical risk factors for thyroid carcinoma.  Visible aorta and great vessels are patent. No definite aortic atherosclerosis.  No acute osseous abnormality identified.  Review of the MIP images confirms the above findings.  IMPRESSION: 1.  No evidence of acute pulmonary embolus. 2. Abnormal lungs with numerous indistinct bilateral pulmonary nodules (semi-solid appearing), with superimposed confluent mass like pulmonary opacity in the left lower lung crossing the major fissure. Favor disseminated infection rather than neoplastic/metastatic disorder. Consider bacterial, fungal, and atypical etiologies (including invasive aspergillosis if the patient is immunocompromised). 3. Mediastinal lymphadenopathy, favor reactive.  Study discussed by telephone with [name] on 09/02/2013 at 07:31. We discussed contact precautions and further infectious workup.   Electronically Signed   By: Lars Pinks M.D.   On: 09/02/2013 07:37   Dg Chest Port 1 View  09/04/2013   CLINICAL DATA:  Post bronchoscopy.  EXAM: PORTABLE CHEST - 1 VIEW  COMPARISON:  09/02/2013  FINDINGS: Negative for pneumothorax.  Patchy airspace disease in the left lung base is unchanged. No significant effusion. No new areas of consolidation.  IMPRESSION: Negative for pneumothorax post bronchoscopy.   Electronically Signed   By: Franchot Gallo M.D.   On: 09/04/2013 09:48   Dg C-arm Bronchoscopy  09/04/2013   CLINICAL DATA: bronchscopy   C-ARM BRONCHOSCOPY  Fluoroscopy was utilized by the requesting physician.  No radiographic  interpretation.    Quantiferon tb gold assay  Negative Bronchial washing fungal culture no yeast or fungal elements  ANCA screen with reflex titer [id number]  Collected: 09/03/13 1331 Specimen Information: Blood Updated: 09/04/13 1531 c-ANCA Screen NEGATIVE p-ANCA Screen NEGATIVE Atypical p-ANCA Screen NEGATIVE Glucose, capillary [[id number]] (Abnormal) Collected: 09/04/13 1114 Glucose-Capillary 160 (H) mg/dL Updated: 09/04/13 1118 AFB culture with smear [id number] Collected: 09/04/13 0815 Specimen Information: Respiratory / Bronchoalveolar Lavage Updated: 09/04/13 1052 ANA [id number] Collected: 09/03/13 1331 Specimen Information: Blood Updated: 09/04/13 1024   HIV antibody [id number] Collected: 09/03/13 1331 Specimen Information: Blood Updated: 09/03/13 2203 HIV NON REACTIVE Rheumatoid factor [id number] Collected: 09/03/13 1331 Specimen Information: Blood Updated: 09/03/13 2127 Rheumatoid Factor <10 IU/mL Hemoglobin A1c [id number] (Abnormal) Collected: 09/03/13 1332 Specimen Information: Blood Updated: 09/03/13 1957 Hemoglobin A1C 9.0 (H) % Mean Plasma Glucose 212 (H) mg/dL    Discharge Exam: Blood pressure 101/52, pulse 79, temperature 98.1 F (36.7 C), temperature source Oral, resp. rate 18, height 5\' 3"  (1.6 m), weight 95.3 kg (210 lb 1.6 oz), last menstrual period 08/28/2008, SpO2 97.00%. General appearance: alert and cooperative Resp: clear to auscultation bilaterally Cardio: regular rate and rhythm, S1, S2 normal, no murmur, click, rub or gallop Extremities: extremities normal, atraumatic, no cyanosis or edema  Disposition: 01-Home or Self Care   Future Appointments Provider Department Dept Phone   09/11/2013 4:15 PM Collene Gobble, MD [facility] [phone]       Medication List    STOP taking these medications       diphenhydrAMINE 25 mg capsule  Commonly known as:  BENADRYL     ibuprofen 200 MG tablet  Commonly known as:  ADVIL,MOTRIN     lisinopril-hydrochlorothiazide 10-12.5 MG per tablet  Commonly known as:  PRINZIDE,ZESTORETIC      TAKE these medications  aspirin EC 81 MG tablet  Take 81 mg by mouth  daily.     atorvastatin 10 MG tablet  Commonly known as:  LIPITOR  Take 10 mg by mouth at bedtime.     CENTRUM ADULTS Tabs  Take 1 tablet by mouth daily.     furosemide 40 MG tablet  Commonly known as:  LASIX  Take 1 tablet (40 mg total) by mouth daily.     insulin glargine 100 UNIT/ML injection  Commonly known as:  LANTUS  Inject 30 Units into the skin at bedtime.     lisinopril 10 MG tablet  Commonly known as:  PRINIVIL,ZESTRIL  Take 1 tablet (10 mg total) by mouth daily at 6 PM.     potassium chloride 10 MEQ tablet  Commonly known as:  K-DUR,KLOR-CON  Take 1 tablet (10 mEq total) by mouth daily.     sitaGLIPtin 100 MG tablet  Commonly known as:  JANUVIA  Take 100 mg by mouth at bedtime.           Follow-up Information   Follow up with Lyzbeth Genrich D, MD In 1 week.   Specialty:  Internal Medicine   Contact information:   [address] [place] [address] [phone]       Signed: Kandice Hams 09/05/2013, 3:31 PM

## 2013-09-05 NOTE — Progress Notes (Signed)
Subjective: Patient without new complaints, no fever no chills, still with hypoxia with ambulation. Sats range between 90 and 92 on room air while sitting in the room this morning.  Objective: Vital signs in last 24 hours: Temp:  [97.8 F (36.6 C)-98 F (36.7 C)] 98 F (36.7 C) (01/09 0526) Pulse Rate:  [70-82] 78 (01/09 0526) Resp:  [13-16] 16 (01/09 0526) BP: (87-116)/(46-65) 101/49 mmHg (01/09 0526) SpO2:  [93 %-99 %] 99 % (01/09 0526) Weight:  [95.3 kg (210 lb 1.6 oz)] 95.3 kg (210 lb 1.6 oz) (01/09 0526) Weight change: 0.3 kg (10.6 oz) Last BM Date: 09/02/13  Intake/Output from previous day: 01/08 0701 - 01/09 0700 In: 950 [P.O.:950] Out: 2150 [Urine:2150] Intake/Output this shift: Total I/O In: -  Out: 400 [Urine:400]  General appearance: alert and cooperative Resp: clear to auscultation bilaterally Cardio: regular rate and rhythm, S1, S2 normal, no murmur, click, rub or gallop Extremities: extremities normal, atraumatic, no cyanosis or edema  Lab Results:  Results for orders placed during the hospital encounter of 09/02/13 (from the past 24 hour(s))  GLUCOSE, CAPILLARY     Status: Abnormal   Collection Time    09/04/13 11:14 AM      Result Value Range   Glucose-Capillary 160 (*) 70 - 99 mg/dL  GLUCOSE, CAPILLARY     Status: Abnormal   Collection Time    09/04/13  4:51 PM      Result Value Range   Glucose-Capillary 181 (*) 70 - 99 mg/dL  GLUCOSE, CAPILLARY     Status: Abnormal   Collection Time    09/04/13 10:50 PM      Result Value Range   Glucose-Capillary 187 (*) 70 - 99 mg/dL  BASIC METABOLIC PANEL     Status: Abnormal   Collection Time    09/05/13  5:44 AM      Result Value Range   Sodium 135 (*) 137 - 147 mEq/L   Potassium 4.1  3.7 - 5.3 mEq/L   Chloride 97  96 - 112 mEq/L   CO2 25  19 - 32 mEq/L   Glucose, Bld 271 (*) 70 - 99 mg/dL   BUN 25 (*) 6 - 23 mg/dL   Creatinine, Ser 0.79  0.50 - 1.10 mg/dL   Calcium 9.3  8.4 - 10.5 mg/dL   GFR calc  non Af Amer >90  >90 mL/min   GFR calc Af Amer >90  >90 mL/min  GLUCOSE, CAPILLARY     Status: Abnormal   Collection Time    09/05/13  7:56 AM      Result Value Range   Glucose-Capillary 256 (*) 70 - 99 mg/dL      Studies/Results: Dg Chest Port 1 View  09/04/2013   CLINICAL DATA:  Post bronchoscopy.  EXAM: PORTABLE CHEST - 1 VIEW  COMPARISON:  09/02/2013  FINDINGS: Negative for pneumothorax.  Patchy airspace disease in the left lung base is unchanged. No significant effusion. No new areas of consolidation.  IMPRESSION: Negative for pneumothorax post bronchoscopy.   Electronically Signed   By: Franchot Gallo M.D.   On: 09/04/2013 09:48   Dg C-arm Bronchoscopy  09/04/2013   CLINICAL DATA: bronchscopy   C-ARM BRONCHOSCOPY  Fluoroscopy was utilized by the requesting physician.  No radiographic  interpretation.     Medications:  Prior to Admission:  Prescriptions prior to admission  Medication Sig Dispense Refill  . aspirin EC 81 MG tablet Take 81 mg by mouth daily.      [name]  atorvastatin (LIPITOR) 10 MG tablet Take 10 mg by mouth at bedtime.      . diphenhydrAMINE (BENADRYL) 25 mg capsule Take 25 mg by mouth every 6 (six) hours as needed for sleep.      [name] ibuprofen (ADVIL,MOTRIN) 200 MG tablet Take 800 mg by mouth every 6 (six) hours as needed (pain).      . insulin glargine (LANTUS) 100 UNIT/ML injection Inject 30 Units into the skin at bedtime.      [name] lisinopril-hydrochlorothiazide (PRINZIDE,ZESTORETIC) 10-12.5 MG per tablet Take 1 tablet by mouth at bedtime.      . Multiple Vitamins-Minerals (CENTRUM ADULTS) TABS Take 1 tablet by mouth daily.      . sitaGLIPtin (JANUVIA) 100 MG tablet Take 100 mg by mouth at bedtime.       Scheduled: . aspirin EC  81 mg Oral Daily  . atorvastatin  10 mg Oral q1800  . furosemide  40 mg Oral Daily  . insulin aspart  0-5 Units Subcutaneous QHS  . insulin aspart  0-9 Units Subcutaneous TID WC  . insulin glargine  30 Units Subcutaneous QHS  . lisinopril   10 mg Oral q1800  . multivitamin with minerals  1 tablet Oral Daily  . potassium chloride  10 mEq Oral Daily  . sodium chloride  3 mL Intravenous Q12H  . sodium chloride  3 mL Intravenous Q12H   Continuous:   Assessment/Plan: Evaluation currently under progress. The patient had abnormal CT, recent bronchoscopy, results are pending. She has a known history of diastolic dysfunction, she presented with a normal BNP however did admit to some improvement with IV Lasix. Still uncertain what role this is playing in her dyspnea as she still is hypoxic despite Lasix.  Await further input from pulmonary, planned for PFTs and further outpatient management. I did discuss with pulmonology today patient may be discharged with oxygen after rounds by pulmonary.  Known diastolic dysfunction Diabetes Hypertension Obesity  LOS: 3 days   Marissa Wilkerson D 09/05/2013, 9:33 AM

## 2013-09-05 NOTE — Consult Note (Signed)
Name: Marissa Wilkerson MRN: [id number] DOB: 1961/04/24    ADMISSION DATE:  09/02/2013 CONSULTATION DATE:  1-6  REFERRING MD :  Marissa Wilkerson PRIMARY SERVICE: Marissa Wilkerson  CHIEF COMPLAINT: SOB  SIGNIFICANT EVENTS / STUDIES:  TTE 06/12/13 >> normal LV size and fxn, grade 1 diastolic dysfxn, mild LA dilation, PASP 51mmHg,   1-8 FOB per [facility]: BAL fungal smear 1/8 >> negative BAL AFB smear 1/8 >>  BAL cytology 1/8 >>  Brushings cytology 1/8 >>  TBBx's path 1/8 >>   RF, ANA, ANCA, 1/8 >> all negative IgE 1/7 >> 55 (normal) Eosinophil count 1/7 >> 0.4 (normal) Fungal hypersensitivity panel 1/7 >>  Quantiferon gold 1/7 >>  HIV 1/7 >> negative   HISTORY OF PRESENT ILLNESS:  53 yo wf(Obese) former smoker(quit 1 year ago) poorly controlled diabetic and HTN who presents to [facility] 1-6 with hypoxia(84% on RA). She has dyspnea that has been insidious over about 3-4 weeks.  She denies FCS or purulent sputum production.  No recent URI or unusual exposures or travels. 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 Of note she recently had loss of taste and smell attributed to uncontrolled DM. Further since sept. 2014 increased lower ext edema 4+ treated with HCTZ per Dr. Delfina Wilkerson. Following her presentation to [facility] CT angio revealed Abnormal lungs with numerous indistinct bilateral pulmonary nodules (semi-solid appearing), with superimposed confluent mass  like pulmonary opacity in the left lower lung crossing the major fissure. There was no PE. TTE from 10/14 showed no evidence of PAH or LV dysfxn.   SUBJECTIVE:  Feels much better  VITAL SIGNS: Temp:  [97.8 F (36.6 C)-98 F (36.7 C)] 98 F (36.7 C) (01/09 0526) Pulse Rate:  [73-82] 78 (01/09 0526) Resp:  [16] 16 (01/09 0526) BP: (101-116)/(46-65) 101/49 mmHg (01/09 0526) SpO2:  [93 %-99 %] 99 % (01/09 0526) Weight:  [210 lb 1.6 oz (95.3 kg)] 210 lb 1.6 oz (95.3 kg) (01/09 0526) HEMODYNAMICS:   VENTILATOR  SETTINGS:   INTAKE / OUTPUT: Intake/Output     01/08 0701 - 01/09 0700 01/09 0701 - 01/10 0700   P.O. 950    Total Intake(mL/kg) 950 (10)    Urine (mL/kg/hr) 2150 (0.9) 400 (1.2)   Total Output 2150 400   Net -1200 -400        Stool Occurrence 1 x      PHYSICAL EXAMINATION: General: Obese WF NAD at rest on O2 Neuro:  Intact HEENT:  No LAN/JVD Cardiovascular:  HSR RRR Lungs:  CTA Abdomen:  Obese +bs Musculoskeletal:  Intact Skin:  Rt foot healing cat scratch mark noted,   LABS:  CBC  Recent Labs Lab 09/02/13 0515 09/02/13 0528  WBC 7.7  --   HGB 12.0 12.6  HCT 35.2* 37.0  PLT 319  --    Coag's No results found for this basename: APTT, INR,  in the last 168 hours BMET  Recent Labs Lab 09/02/13 0528 09/04/13 0512 09/05/13 0544  NA 137 136* 135*  K 4.0 4.3 4.1  CL 100 98 97  CO2  --  25 25  BUN 18 26* 25*  CREATININE 0.70 0.79 0.79  GLUCOSE 174* 246* 271*   Electrolytes  Recent Labs Lab 09/04/13 0512 09/05/13 0544  CALCIUM 9.5 9.3   Cardiac Enzymes  Recent Labs Lab 09/02/13 0515  PROBNP 91.1   Glucose  Recent Labs Lab 09/03/13 1747 09/03/13 2134  09/04/13 1114 09/04/13 1651 09/04/13 2250 09/05/13 0756  GLUCAP 256* 258* 160* 181* 187* 256*    Imaging Dg Chest Port 1 View  09/04/2013   CLINICAL DATA:  Post bronchoscopy.  EXAM: PORTABLE CHEST - 1 VIEW  COMPARISON:  09/02/2013  FINDINGS: Negative for pneumothorax.  Patchy airspace disease in the left lung base is unchanged. No significant effusion. No new areas of consolidation.  IMPRESSION: Negative for pneumothorax post bronchoscopy.   Electronically Signed   By: Franchot Gallo M.D.   On: 09/04/2013 09:48   Dg C-arm Bronchoscopy  09/04/2013   CLINICAL DATA: bronchscopy   C-ARM BRONCHOSCOPY  Fluoroscopy was utilized by the requesting physician.  No radiographic  interpretation.      ASSESSMENT / PLAN:  PULMONARY A:  1 - Hypoxic Resp failure in setting of volume overload (better with  lasix but persists), Tobacco use, abnormal CT. No evidence PE on the CT scan 2- Abnormal CT scan chest . Bilateral semisolid nodules with a more confluent consolidated lesion LLL. DDx includes auto-immune process, atypical infxn, less likely malignancy. S/p FOB 1/8 P:   O2 as needed, may be O2 dependent for now, suspect she will go home on O2 Will need full PFT as an outpt Autoimmune and hypersensitivity labs are still pending, could f/u as an outpt FOB on 1/8 for BAL, brushings, biopsies > results still pending Further cardiological work up in future Better control of Htn/DM should be beneficial  She has a F/u visit scheduled w [facility] on 1/15 at 4:15pm   [facility] Pager [phone] till 3 pm If no answer page [phone] 09/05/2013, 10:35 AM  Marissa Wilkerson 09/05/2013, 11:33 AM [facility] [phone] or if no answer [phone]

## 2013-09-05 NOTE — Progress Notes (Signed)
Pt ambulated length of hallway x1 on room air with Room Air O2 sats dropping to 84%. Heart rate up to 135 and Pt with c/o mild dizziness and increased shortness of breath with ambulation on room air. Returned to room O2 placed back on and O2 sats on 2l [place] up to 94%.

## 2013-09-06 LAB — CULTURE, BAL-QUANTITATIVE

## 2013-09-06 LAB — CULTURE, BAL-QUANTITATIVE W GRAM STAIN: Special Requests: NORMAL

## 2013-09-08 ENCOUNTER — Telehealth: Payer: Self-pay | Admitting: Emergency Medicine

## 2013-09-08 DIAGNOSIS — J9601 Acute respiratory failure with hypoxia: Secondary | ICD-10-CM

## 2013-09-08 LAB — CULTURE, BLOOD (ROUTINE X 2)
Culture: NO GROWTH
Culture: NO GROWTH

## 2013-09-08 NOTE — Telephone Encounter (Signed)
Called and spoke with pt and she is aware of PFT that has been scheduled 1/15 at 2:30  Pt is aware to arrive about 15 mins early and check in with admitting at [facility].  Pt voiced her understanding and nothing further is needed.

## 2013-09-09 LAB — FUNGAL ANTIBODIES PANEL, ID-BLOOD
ASPERGILLUS NIGER ANTIBODIES: NEGATIVE
Aspergillus Flavus Antibodies: NEGATIVE
Aspergillus fumigatus: NEGATIVE
Blastomyces Abs, Qn, DID: NEGATIVE
Coccidioides Antibody ID: NEGATIVE
Histoplasma Ab, Immunodiffusion: NEGATIVE

## 2013-09-11 ENCOUNTER — Ambulatory Visit (HOSPITAL_COMMUNITY)
Admission: RE | Admit: 2013-09-11 | Discharge: 2013-09-11 | Disposition: A | Discharge status: Home / Self Care | Payer: 59 | Source: Ambulatory Visit | Attending: Emergency Medicine | Admitting: Emergency Medicine

## 2013-09-11 ENCOUNTER — Encounter: Payer: Self-pay | Admitting: Emergency Medicine

## 2013-09-11 ENCOUNTER — Ambulatory Visit (INDEPENDENT_AMBULATORY_CARE_PROVIDER_SITE_OTHER): Payer: 59 | Admitting: Emergency Medicine

## 2013-09-11 VITALS — BP 140/62 | HR 97 | Ht 63.0 in | Wt 216.4 lb

## 2013-09-11 DIAGNOSIS — J984 Other disorders of lung: Secondary | ICD-10-CM | POA: Insufficient documentation

## 2013-09-11 DIAGNOSIS — J9601 Acute respiratory failure with hypoxia: Secondary | ICD-10-CM

## 2013-09-11 DIAGNOSIS — R918 Other nonspecific abnormal finding of lung field: Secondary | ICD-10-CM

## 2013-09-11 DIAGNOSIS — Z87891 Personal history of nicotine dependence: Secondary | ICD-10-CM | POA: Insufficient documentation

## 2013-09-11 LAB — PULMONARY FUNCTION TEST
DL/VA % pred: 89 %
DL/VA: 4.19 ml/min/mmHg/L
DLCO COR % PRED: 58 %
DLCO UNC % PRED: 56 %
DLCO cor: 13.45 ml/min/mmHg
DLCO unc: 12.83 ml/min/mmHg
FEF 25-75 POST: 1.64 L/s
FEF 25-75 Pre: 1.19 L/sec
FEF2575-%Change-Post: 38 %
FEF2575-%PRED-PRE: 44 %
FEF2575-%Pred-Post: 61 %
FEV1-%Change-Post: 8 %
FEV1-%PRED-PRE: 53 %
FEV1-%Pred-Post: 57 %
FEV1-PRE: 1.42 L
FEV1-Post: 1.54 L
FEV1FVC-%Change-Post: 5 %
FEV1FVC-%Pred-Pre: 93 %
FEV6-%CHANGE-POST: 2 %
FEV6-%PRED-POST: 58 %
FEV6-%Pred-Pre: 57 %
FEV6-Post: 1.94 L
FEV6-Pre: 1.89 L
FEV6FVC-%Pred-Post: 103 %
FEV6FVC-%Pred-Pre: 103 %
FVC-%CHANGE-POST: 2 %
FVC-%PRED-POST: 57 %
FVC-%Pred-Pre: 55 %
FVC-POST: 1.94 L
FVC-Pre: 1.89 L
POST FEV1/FVC RATIO: 79 %
POST FEV6/FVC RATIO: 100 %
Pre FEV1/FVC ratio: 75 %
Pre FEV6/FVC Ratio: 100 %
RV % pred: 102 %
RV: 1.8 L
TLC % pred: 77 %
TLC: 3.79 L

## 2013-09-11 MED ORDER — PREDNISONE 20 MG PO TABS: 40.0000 mg | ORAL_TABLET | Freq: Every day | ORAL | Status: DC

## 2013-09-11 MED ORDER — ALBUTEROL SULFATE (2.5 MG/3ML) 0.083% IN NEBU
2.5000 mg | INHALATION_SOLUTION | Freq: Once | RESPIRATORY_TRACT | Status: AC
  Administered 2013-09-11: 2.5 mg via RESPIRATORY_TRACT

## 2013-09-11 NOTE — Patient Instructions (Signed)
Please start prednisone 40mg  daily. We will plan to do this for at least 3 weeks.  Call our office for any concerning symptoms on the prednisone Continue your oxygen at 2L/min at all times.  We will repeat your CT scan of the chest after steroid trial to assess the pulmonary nodules.  Depending on your response to prednisone we may decide to perform another lung procedure.  Follow with Dr Lamonte Sakai in 1 month

## 2013-09-11 NOTE — Progress Notes (Signed)
Subjective:    Patient ID: Marissa Wilkerson, female    DOB: October 14, 1960, 53 y.o.   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 I saw her initially in consultation after admission to [facility] for hypoxemia 09/02/13. She had experienced dyspnea that has been insidious over about 3-4 weeks. She denied FCS or purulent sputum production. No recent URI or unusual exposures or travels. She was treated with HCTZ in 9/'14 by Dr Delfina Redwood when she developed significant LE edema. TTE from 10/14 showed no evidence of PAH or LV dysfxn. Following her presentation to [facility] CT-PA revealed Abnormal lungs with numerous indistinct bilateral pulmonary nodules (semi-solid appearing), with superimposed confluent mass like pulmonary opacity in the left lower lung crossing the major fissure. There was no PE.  We performed serologies and FOB during the admission. She was discharged to home on O2 with plan to f/u today. She feels some dyspnea w exertion. Continues to desat with exertion on RA.    Review of Systems  Constitutional: Negative for fever and unexpected weight change.  HENT: Positive for congestion and postnasal drip. Negative for dental problem, ear pain, nosebleeds, rhinorrhea, sinus pressure, sneezing, sore throat and trouble swallowing.   Eyes: Negative for redness and itching.  Respiratory: Positive for shortness of breath. Negative for cough, chest tightness and wheezing.   Cardiovascular: Positive for palpitations and leg swelling.  Gastrointestinal: Negative for nausea and vomiting.  Genitourinary: Negative for dysuria.  Musculoskeletal: Negative for joint swelling.  Skin: Negative for rash.  Neurological: Negative for headaches.  Hematological: Does not bruise/bleed easily.  Psychiatric/Behavioral: Negative for dysphoric mood. The patient is not nervous/anxious.        Objective:   Physical  Exam Filed Vitals:   09/11/13 1623  BP: 140/62  Pulse: 97  Height: 5\' 3"  (1.6 m)  Weight: 216 lb 6.4 oz (98.158 kg)  SpO2: 93%    Gen: Pleasant, overwt woman, in no distress,  normal affect  ENT: No lesions,  mouth clear,  oropharynx clear, no postnasal drip  Neck: No JVD, no stridor  Lungs: No use of accessory muscles, clear without rales or rhonchi with exception of soft end-exp wheeze on a forced expiration  Cardiovascular: RRR, heart sounds normal, no murmur or gallops, no peripheral edema  Musculoskeletal: No deformities, no cyanosis or clubbing  Neuro: alert, non focal  Skin: Warm, no lesions or rashes   Testing:  BAL fungal smear 1/8 >> negative, cx pending BAL AFB smear 1/8 >> negative, cx pending BAL cytology 1/8 >> negative Brushings cytology 1/8 >> negative TBBx's path 1/8 >> negative RF, ANA, ANCA, 1/8 >> all negative  IgE 1/7 >> 55 (normal)  Eosinophil count 1/7 >> 0.4 (normal)  Fungal hypersensitivity panel 1/7 >> all negative Quantiferon gold 1/7 >> negative HIV 1/7 >> negative   CT scan chest 09/02/13 --  COMPARISON: Chest radiographs from the same day reported  separately.  FINDINGS:  Adequate contrast bolus timing in the pulmonary arterial tree.  No focal filling defect identified in the pulmonary arterial tree to  suggest the presence of acute pulmonary embolism.  In the left lower lung affecting both the lingula and anterior basal  segment of the lower lobe there is a mass like opacity measuring 40  x 31 x 25 mm, affecting both sides of the major fissure (series 7  image when 126).  There  are numerous superimposed solid to the semi solid pulmonary  nodules throughout both lungs. Most measure 5-6 mm diameter, the  largest are 10-11 mm. These head indistinct margins, resembling  ground-glass. Superimposed bilateral mosaic attenuation. Additional  more confluent opacity in both costophrenic sulci and dependently  along the lateral right major  fissure. Superimposed curvilinear  probable scarring in the left apex. Major airways are patent.  No pericardial or pleural effusion, but there is mediastinal  lymphadenopathy in the form of increased size and number of nodes,  measuring up to 14 mm short axis individually. Bilateral hilar nodes  are less affected. There is cardiomegaly.  Negative visualized liver dome and spleen. Thyromegaly with only a  subcentimeter right lobe thyroid nodule visible, too small to  characterize, but most likely benign in the absence of known  clinical risk factors for thyroid carcinoma.  Visible aorta and great vessels are patent. No definite aortic  atherosclerosis.  No acute osseous abnormality identified.  Review of the MIP images confirms the above findings.  IMPRESSION:  1. No evidence of acute pulmonary embolus.  2. Abnormal lungs with numerous indistinct bilateral pulmonary  nodules (semi-solid appearing), with superimposed confluent mass  like pulmonary opacity in the left lower lung crossing the major  fissure. Favor disseminated infection rather than  neoplastic/metastatic disorder. Consider bacterial, fungal, and  atypical etiologies (including invasive aspergillosis if the patient  is immunocompromised).  3. Mediastinal lymphadenopathy, favor reactive.      Assessment & Plan:  Pulmonary nodules/lesions, multiple Etiology unclear. Serologies are all negative, although note that ACE level was never checked. DDx is broad. The appearance on CT scan shows nodular disease + a patchy more diffuse process that could represent larger-scale inflammation vs air-trapping. Possible causes include malignancy, especially regarding the largest lesion in the mid left lung. Other possibilities include atypical infxn or inflammatory process such as sarcoidosis, COP, eosinophilic PNA, etc. Because her cx data is negative to date, I am comfortable with a steroid trial. Plan for 40mg  pred daily x 3-4 weeks then  reassess, reimage. If no better then I favor repeat bx either by ENB or by VATS

## 2013-09-11 NOTE — Assessment & Plan Note (Signed)
Etiology unclear. Serologies are all negative, although note that ACE level was never checked. DDx is broad. The appearance on CT scan shows nodular disease + a patchy more diffuse process that could represent larger-scale inflammation vs air-trapping. Possible causes include malignancy, especially regarding the largest lesion in the mid left lung. Other possibilities include atypical infxn or inflammatory process such as sarcoidosis, COP, eosinophilic PNA, etc. Because her cx data is negative to date, I am comfortable with a steroid trial. Plan for 40mg  pred daily x 3-4 weeks then reassess, reimage. If no better then I favor repeat bx either by ENB or by VATS

## 2013-09-15 ENCOUNTER — Other Ambulatory Visit: Payer: Self-pay | Admitting: Emergency Medicine

## 2013-09-15 ENCOUNTER — Telehealth: Payer: Self-pay | Admitting: Emergency Medicine

## 2013-09-15 NOTE — Telephone Encounter (Signed)
Pt sent [place] a MyChart message about getting scheduled for the CT RB wanted her to have done after being on Prednisone for 3 weeks. Advised her by phone that we would schedule this for her and then let her know when it will be.  RB - what kind of CT do we need to order? CT Angio again or regular CT?

## 2013-09-17 NOTE — Telephone Encounter (Signed)
[email or web address] Gulino, CMA  

## 2013-09-17 NOTE — Telephone Encounter (Signed)
Id like to see her back before we do the scan - assess her improvement on the steroids and then order it. Please let her know, thanks.

## 2013-09-18 NOTE — Telephone Encounter (Signed)
[email or web address] x 2  

## 2013-09-18 NOTE — Telephone Encounter (Signed)
Pt called back. Made her aware of RB recs. She voiced her understanding and needed nothing further

## 2013-09-26 ENCOUNTER — Other Ambulatory Visit: Payer: Self-pay | Admitting: Internal Medicine

## 2013-09-26 ENCOUNTER — Ambulatory Visit
Admission: RE | Admit: 2013-09-26 | Discharge: 2013-09-26 | Disposition: A | Discharge status: Home / Self Care | Payer: 59 | Source: Ambulatory Visit | Attending: Internal Medicine | Admitting: Internal Medicine

## 2013-09-26 DIAGNOSIS — M545 Low back pain, unspecified: Secondary | ICD-10-CM

## 2013-10-03 LAB — FUNGUS CULTURE W SMEAR
Fungal Smear: NONE SEEN
Special Requests: NORMAL

## 2013-10-13 ENCOUNTER — Ambulatory Visit: Payer: 59 | Admitting: Emergency Medicine

## 2013-10-17 LAB — AFB CULTURE WITH SMEAR (NOT AT ARMC)
ACID FAST SMEAR: NONE SEEN
Special Requests: NORMAL

## 2013-10-21 ENCOUNTER — Ambulatory Visit: Payer: 59 | Admitting: Emergency Medicine

## 2013-10-22 ENCOUNTER — Encounter: Payer: Self-pay | Admitting: Emergency Medicine

## 2013-10-22 ENCOUNTER — Ambulatory Visit (INDEPENDENT_AMBULATORY_CARE_PROVIDER_SITE_OTHER): Payer: 59 | Admitting: Emergency Medicine

## 2013-10-22 VITALS — BP 120/72 | HR 84 | Ht 63.0 in | Wt 245.0 lb

## 2013-10-22 DIAGNOSIS — R918 Other nonspecific abnormal finding of lung field: Secondary | ICD-10-CM

## 2013-10-22 NOTE — Assessment & Plan Note (Signed)
FOB for cx, auto-immune screening has been negative. She hasn't responded clinically to prednisone. At this point she needs either ENB to get at largest nodule or preferably VATS to achieve dx. I will repeat the CT now, assess the residual nodules and then decide. Expect she will need to go to [facility], set up a VATS.

## 2013-10-22 NOTE — Progress Notes (Signed)
Subjective:    Patient ID: Marissa Wilkerson, female    DOB: September 21, 1960, 53 y.o.   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 I saw her initially in consultation after admission to [facility] for hypoxemia 09/02/13. She had experienced dyspnea that has been insidious over about 3-4 weeks. She denied FCS or purulent sputum production. No recent URI or unusual exposures or travels. She was treated with HCTZ in 9/'14 by Dr Delfina Redwood when she developed significant LE edema. TTE from 10/14 showed no evidence of PAH or LV dysfxn. Following her presentation to [facility] CT-PA revealed Abnormal lungs with numerous indistinct bilateral pulmonary nodules (semi-solid appearing), with superimposed confluent mass like pulmonary opacity in the left lower lung crossing the major fissure. There was no PE.  We performed serologies and FOB during the admission. She was discharged to home on O2 with plan to f/u today. She feels some dyspnea w exertion. Continues to desat with exertion on RA.   ROV 10/22/13 -- follows up for dyspnea, hypoxemia, nodular disease on her Ct chest. After negative culture data on FOB we initiated steroid trial, 40mg  daily. Returns today  > no changes in her breathing but side effects > edema, wt gain of 30 lbs. CBG's have been higher. Her lowest SpO2 with exertion on RA has been 82%.   Review of Systems  Constitutional: Positive for activity change and unexpected weight change. Negative for fever.  HENT: Negative for congestion, dental problem, ear pain, nosebleeds, postnasal drip, rhinorrhea, sinus pressure, sneezing, sore throat and trouble swallowing.   Eyes: Negative for redness and itching.  Respiratory: Positive for shortness of breath. Negative for cough, chest tightness and wheezing.   Cardiovascular: Positive for leg swelling. Negative for palpitations.  Gastrointestinal: Negative for  nausea and vomiting.  Genitourinary: Negative for dysuria.  Musculoskeletal: Positive for back pain. Negative for joint swelling.  Skin: Negative for rash.  Neurological: Negative for headaches.  Hematological: Does not bruise/bleed easily.  Psychiatric/Behavioral: Negative for dysphoric mood. The patient is not nervous/anxious.        Objective:   Physical Exam Filed Vitals:   10/22/13 0933  BP: 120/72  Pulse: 84  Height: 5\' 3"  (1.6 m)  Weight: 245 lb (111.131 kg)  SpO2: 100%    Gen: Pleasant, overwt woman, in no distress,  normal affect  ENT: No lesions,  mouth clear,  oropharynx clear, no postnasal drip, evolving some cushingoid features  Neck: No JVD, no stridor  Lungs: No use of accessory muscles, clear without rales or rhonchi with exception of soft end-exp wheeze on a forced expiration  Cardiovascular: RRR, heart sounds normal, no murmur or gallops, no peripheral edema  Musculoskeletal: No deformities, no cyanosis or clubbing  Neuro: alert, non focal  Skin: Warm, no lesions or rashes   Testing:  BAL fungal smear 1/8 >> negative, cx pending BAL AFB smear 1/8 >> negative, cx pending BAL cytology 1/8 >> negative Brushings cytology 1/8 >> negative TBBx's path 1/8 >> negative RF, ANA, ANCA, 1/8 >> all negative  IgE 1/7 >> 55 (normal)  Eosinophil count 1/7 >> 0.4 (normal)  Fungal hypersensitivity panel 1/7 >> all negative Quantiferon gold 1/7 >> negative HIV 1/7 >> negative   CT scan chest 09/02/13 --  COMPARISON: Chest radiographs from the same day reported  separately.  FINDINGS:  Adequate contrast bolus timing in the pulmonary arterial tree.  No focal filling defect identified in the pulmonary arterial tree to  suggest the presence of acute pulmonary embolism.  In the left lower lung affecting both the lingula and anterior basal  segment of the lower lobe there is a mass like opacity measuring 40  x 31 x 25 mm, affecting both sides of the major fissure  (series 7  image when 126).  There are numerous superimposed solid to the semi solid pulmonary  nodules throughout both lungs. Most measure 5-6 mm diameter, the  largest are 10-11 mm. These head indistinct margins, resembling  ground-glass. Superimposed bilateral mosaic attenuation. Additional  more confluent opacity in both costophrenic sulci and dependently  along the lateral right major fissure. Superimposed curvilinear  probable scarring in the left apex. Major airways are patent.  No pericardial or pleural effusion, but there is mediastinal  lymphadenopathy in the form of increased size and number of nodes,  measuring up to 14 mm short axis individually. Bilateral hilar nodes  are less affected. There is cardiomegaly.  Negative visualized liver dome and spleen. Thyromegaly with only a  subcentimeter right lobe thyroid nodule visible, too small to  characterize, but most likely benign in the absence of known  clinical risk factors for thyroid carcinoma.  Visible aorta and great vessels are patent. No definite aortic  atherosclerosis.  No acute osseous abnormality identified.  Review of the MIP images confirms the above findings.  IMPRESSION:  1. No evidence of acute pulmonary embolus.  2. Abnormal lungs with numerous indistinct bilateral pulmonary  nodules (semi-solid appearing), with superimposed confluent mass  like pulmonary opacity in the left lower lung crossing the major  fissure. Favor disseminated infection rather than  neoplastic/metastatic disorder. Consider bacterial, fungal, and  atypical etiologies (including invasive aspergillosis if the patient  is immunocompromised).  3. Mediastinal lymphadenopathy, favor reactive.      Assessment & Plan:  Pulmonary nodules/lesions, multiple FOB for cx, auto-immune screening has been negative. She hasn't responded clinically to prednisone. At this point she needs either ENB to get at largest nodule or preferably VATS to achieve  dx. I will repeat the CT now, assess the residual nodules and then decide. Expect she will need to go to [facility], set up a VATS.

## 2013-10-22 NOTE — Patient Instructions (Signed)
We will perform a High res CT scan chest now Please taper your prednisone > 30mg  x 5 days, then 20mg  x 5 days, the 10mg  x 5 days, then zero.  Wear your oxygen with all exertion.  We will follow up next week to review the CT scan and plan our next steps.

## 2013-10-27 ENCOUNTER — Encounter (HOSPITAL_COMMUNITY): Payer: Self-pay

## 2013-10-27 ENCOUNTER — Ambulatory Visit (HOSPITAL_COMMUNITY)
Admission: RE | Admit: 2013-10-27 | Discharge: 2013-10-27 | Disposition: A | Discharge status: Home / Self Care | Payer: 59 | Source: Ambulatory Visit | Attending: Emergency Medicine | Admitting: Emergency Medicine

## 2013-10-27 DIAGNOSIS — R918 Other nonspecific abnormal finding of lung field: Secondary | ICD-10-CM | POA: Insufficient documentation

## 2013-10-27 DIAGNOSIS — R0602 Shortness of breath: Secondary | ICD-10-CM | POA: Insufficient documentation

## 2013-10-27 DIAGNOSIS — J984 Other disorders of lung: Secondary | ICD-10-CM | POA: Insufficient documentation

## 2013-10-30 ENCOUNTER — Ambulatory Visit (INDEPENDENT_AMBULATORY_CARE_PROVIDER_SITE_OTHER): Payer: 59 | Admitting: Emergency Medicine

## 2013-10-30 ENCOUNTER — Encounter: Payer: Self-pay | Admitting: Emergency Medicine

## 2013-10-30 VITALS — BP 140/70 | HR 88 | Ht 63.0 in | Wt 247.0 lb

## 2013-10-30 DIAGNOSIS — R918 Other nonspecific abnormal finding of lung field: Secondary | ICD-10-CM

## 2013-10-30 MED ORDER — ALBUTEROL SULFATE HFA 108 (90 BASE) MCG/ACT IN AERS: 2.0000 | INHALATION_SPRAY | Freq: Four times a day (QID) | RESPIRATORY_TRACT | Status: DC | PRN

## 2013-10-30 NOTE — Patient Instructions (Signed)
We try using albuterol as needed to see if you benefit We will not refer you for a biopsy at this time Please continue your oxygen with exertion We will repeat your CT scan in the future. Your symptoms will help [place] decide when to repeat.  Finish your prednisone as planned Follow with Dr Lamonte Sakai in 1 month

## 2013-10-30 NOTE — Assessment & Plan Note (Signed)
Improved on CT scan 10/27/13. 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 Vasculitis. Dyspnea persists, ? Influenced by her weight gain as well as obstructive disease.  - trial SABA - wean pred to off - repeat CT scan in future, no later than 6 months - no bx planned at this time.  - rov 1

## 2013-10-30 NOTE — Progress Notes (Signed)
Subjective:    Patient ID: Marissa Wilkerson, female    DOB: [birth year], 53 y.o.   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 I saw her initially in consultation after admission to [facility] for hypoxemia 09/02/13. She had experienced dyspnea that has been insidious over about 3-4 weeks. She denied FCS or purulent sputum production. No recent URI or unusual exposures or travels. She was treated with HCTZ in 9/'14 by Dr Delfina Redwood when she developed significant LE edema. TTE from 10/14 showed no evidence of PAH or LV dysfxn. Following her presentation to [facility] CT-PA revealed Abnormal lungs with numerous indistinct bilateral pulmonary nodules (semi-solid appearing), with superimposed confluent mass like pulmonary opacity in the left lower lung crossing the major fissure. There was no PE.  We performed serologies and FOB during the admission. She was discharged to home on O2 with plan to f/u today. She feels some dyspnea w exertion. Continues to desat with exertion on RA.   ROV 10/22/13 -- follows up for dyspnea, hypoxemia, nodular disease on her Ct chest. After negative culture data on FOB we initiated steroid trial, 40mg  daily. Returns today  > no changes in her breathing but side effects > edema, wt gain of 30 lbs. CBG's have been higher. Her lowest SpO2 with exertion on RA has been 82%.   ROV 10/30/13 -- dyspnea, hypoxemia, nodular disease on CT chest. FOB negative. Trial steroids did not change her SOB. Repeat Ct scan >>  . She has seen documented desats with exertion. She is still wearing O2 w exertion. No cough or wheeze. She does have low back pain.     Review of Systems  Constitutional: Positive for activity change and unexpected weight change. Negative for fever.  HENT: Negative for congestion, dental problem, ear pain, nosebleeds, postnasal drip, rhinorrhea, sinus pressure, sneezing, sore  throat and trouble swallowing.   Eyes: Negative for redness and itching.  Respiratory: Positive for shortness of breath. Negative for cough, chest tightness and wheezing.   Cardiovascular: Positive for leg swelling. Negative for palpitations.  Gastrointestinal: Negative for nausea and vomiting.  Genitourinary: Negative for dysuria.  Musculoskeletal: Positive for back pain. Negative for joint swelling.  Skin: Negative for rash.  Neurological: Negative for headaches.  Hematological: Does not bruise/bleed easily.  Psychiatric/Behavioral: Negative for dysphoric mood. The patient is not nervous/anxious.        Objective:   Physical Exam Filed Vitals:   10/30/13 1336  BP: 140/70  Pulse: 88  Height: 5\' 3"  (1.6 m)  Weight: 247 lb (112.038 kg)  SpO2: 95%    Gen: Pleasant, overwt woman, in no distress,  normal affect  ENT: No lesions,  mouth clear,  oropharynx clear, no postnasal drip, evolving some cushingoid features  Neck: No JVD, no stridor  Lungs: No use of accessory muscles, clear without rales or rhonchi with exception of soft end-exp wheeze on a forced expiration  Cardiovascular: RRR, heart sounds normal, no murmur or gallops, no peripheral edema  Musculoskeletal: No deformities, no cyanosis or clubbing  Neuro: alert, non focal  Skin: Warm, no lesions or rashes   Testing:  BAL fungal smear 1/8 >> negative, cx pending BAL AFB smear 1/8 >> negative, cx pending BAL cytology 1/8 >> negative Brushings cytology 1/8 >> negative TBBx's path 1/8 >> negative RF, ANA, ANCA, 1/8 >> all negative  IgE 1/7 >> 55 (normal)  Eosinophil count 1/7 >> 0.4 (normal)  Fungal hypersensitivity panel 1/7 >> all negative Quantiferon gold 1/7 >> negative HIV 1/7 >> negative   10/27/13 --  COMPARISON: CT ANGIO CHEST W/CM &/OR WO/CM dated 09/02/2013  FINDINGS:  Mediastinal lymph nodes are not enlarged by CT size criteria. Hilar  regions are difficult to definitively evaluate without IV  contrast.  No axillary adenopathy. Heart is enlarged. No pericardial effusion.  Mild mosaic attenuation. Scattered linear scarring. Previously seen  areas of nodular and masslike airspace consolidation have resolved  in the interval. Scattered pulmonary nodules measure 4 mm or less in  size and are unchanged. No pleural fluid. Airway is unremarkable.  Incidental imaging of the upper abdomen shows the visualized  portions of the liver , adrenal glands, kidneys, spleen, pancreas,  stomach and bowel to be grossly unremarkable. Cholecystectomy. No  upper abdominal adenopathy. No worrisome lytic or sclerotic lesions.  IMPRESSION:  1. Interval clearing of previously seen areas of nodular and  masslike airspace consolidation, most consistent with resolved  pneumonia.  2. Additional scattered pulmonary nodules measure 4 mm or less in  size and are unchanged. If the patient is at high risk for  bronchogenic carcinoma, follow-up chest CT at 1 year is recommended.  If the patient is at low risk, no follow-up is needed. This  recommendation follows the consensus statement: Guidelines for  Management of Small Pulmonary Nodules Detected on CT Scans: A  Statement from the [place] as published in Radiology  2005; 237:395-400.  3. Mild mosaic pulmonary parenchymal attenuation can be seen in the  setting of small airways disease       Assessment & Plan:  Pulmonary nodules/lesions, multiple Improved on CT scan 10/27/13. 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 Vasculitis. Dyspnea persists, ? Influenced by her weight gain as well as obstructive disease.  - trial SABA - wean pred to off - repeat CT scan in future, no later than 6 months - no bx planned at this time.  - rov 1

## 2013-11-12 ENCOUNTER — Other Ambulatory Visit (HOSPITAL_COMMUNITY): Payer: Self-pay | Admitting: Sports Medicine

## 2013-11-12 DIAGNOSIS — M545 Low back pain, unspecified: Secondary | ICD-10-CM

## 2013-11-21 ENCOUNTER — Ambulatory Visit (HOSPITAL_COMMUNITY)
Admission: RE | Admit: 2013-11-21 | Discharge: 2013-11-21 | Disposition: A | Discharge status: Home / Self Care | Payer: 59 | Source: Ambulatory Visit | Attending: Sports Medicine | Admitting: Sports Medicine

## 2013-11-21 ENCOUNTER — Other Ambulatory Visit (HOSPITAL_COMMUNITY): Payer: Self-pay | Admitting: Sports Medicine

## 2013-11-21 DIAGNOSIS — M545 Low back pain, unspecified: Secondary | ICD-10-CM | POA: Insufficient documentation

## 2013-11-21 DIAGNOSIS — R52 Pain, unspecified: Secondary | ICD-10-CM

## 2013-11-21 MED ORDER — GADOBENATE DIMEGLUMINE 529 MG/ML IV SOLN
20.0000 mL | Freq: Once | INTRAVENOUS | Status: AC | PRN
  Administered 2013-11-21: 20 mL via INTRAVENOUS

## 2013-12-01 ENCOUNTER — Encounter: Payer: Self-pay | Admitting: Emergency Medicine

## 2013-12-01 ENCOUNTER — Ambulatory Visit (INDEPENDENT_AMBULATORY_CARE_PROVIDER_SITE_OTHER): Payer: 59 | Admitting: Emergency Medicine

## 2013-12-01 VITALS — BP 120/64 | HR 101 | Ht 63.0 in | Wt 246.0 lb

## 2013-12-01 DIAGNOSIS — R918 Other nonspecific abnormal finding of lung field: Secondary | ICD-10-CM

## 2013-12-01 NOTE — Assessment & Plan Note (Signed)
With diffuse GGI and nodular disease that improved after steroids. Unfortunately her clinical status and hypoxemia did not improve along with the radiographical changes. Unclear to me now whether her current sx are related in part to her 40 lb wt gain and deconditioning (+ back pain) or to her parenchymal process. 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   We will start Symbicort 2 puffs twice a day Please call our office to let us know how you have done on the Symbicort so we can order for you if you've benefited.  Wear your oxygen with exertion Continue to work with physical therapy for your back and to lose weight We may decide to consider lung biopsy in the future.  We will discuss the timing of repeating your CT scan at your next visit.  Follow with Dr Lamonte Sakai in 2 months or sooner if you have any problems.

## 2013-12-01 NOTE — Patient Instructions (Signed)
We will start Symbicort 2 puffs twice a day Please call our office to let us know how you have done on the Symbicort so we can order for you if you've benefited.  Wear your oxygen with exertion Continue to work with physical therapy for your back and to lose weight We may decide to consider lung biopsy in the future.  We will discuss the timing of repeating your CT scan at your next visit.  Follow with Marissa Wilkerson in 2 months or sooner if you have any problems.

## 2013-12-01 NOTE — Progress Notes (Signed)
Subjective:    Patient ID: Marissa Wilkerson, female    DOB: 06-25-1961, 53 y.o.   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 I saw her initially in consultation after admission to [facility] for hypoxemia 09/02/13. She had experienced dyspnea that has been insidious over about 3-4 weeks. She denied FCS or purulent sputum production. No recent URI or unusual exposures or travels. She was treated with HCTZ in 9/'14 by Dr Delfina Redwood when she developed significant LE edema. TTE from 10/14 showed no evidence of PAH or LV dysfxn. Following her presentation to [facility] CT-PA revealed Abnormal lungs with numerous indistinct bilateral pulmonary nodules (semi-solid appearing), with superimposed confluent mass like pulmonary opacity in the left lower lung crossing the major fissure. There was no PE.  We performed serologies and FOB during the admission. She was discharged to home on O2 with plan to f/u today. She feels some dyspnea w exertion. Continues to desat with exertion on RA.   ROV 10/22/13 -- follows up for dyspnea, hypoxemia, nodular disease on her Ct chest. After negative culture data on FOB we initiated steroid trial, 40mg  daily. Returns today  > no changes in her breathing but side effects > edema, wt gain of 30 lbs. CBG's have been higher. Her lowest SpO2 with exertion on RA has been 82%.   ROV 10/30/13 -- dyspnea, hypoxemia, nodular disease on CT chest. FOB negative. Trial steroids did not change her SOB. Repeat Ct scan >>  2//25/15 . She has seen documented desats with exertion. She is still wearing O2 w exertion. No cough or wheeze. She does have low back pain.   ROV 12/31/13 -- follow up for dyspnea, hypoxemia, nodular disease on CT chest. FOB negative for organisms. Her repeat CT scan > large scale resolution of the pneumonitis and nodular disease with some residual micro-nodular disease. She has gained  40 lbs on the pred > now off. She has documented exertional desats to 80's on RA. She hasn't really benefited from albuterol. She is doing physical therapy 2x a week.     Review of Systems  Constitutional: Positive for activity change and unexpected weight change. Negative for fever.  HENT: Negative for congestion, dental problem, ear pain, nosebleeds, postnasal drip, rhinorrhea, sinus pressure, sneezing, sore throat and trouble swallowing.   Eyes: Negative for redness and itching.  Respiratory: Positive for shortness of breath. Negative for cough, chest tightness and wheezing.   Cardiovascular: Positive for leg swelling. Negative for palpitations.  Gastrointestinal: Negative for nausea and vomiting.  Genitourinary: Negative for dysuria.  Musculoskeletal: Positive for back pain. Negative for joint swelling.  Skin: Negative for rash.  Neurological: Negative for headaches.  Hematological: Does not bruise/bleed easily.  Psychiatric/Behavioral: Negative for dysphoric mood. The patient is not nervous/anxious.        Objective:   Physical Exam Filed Vitals:   12/01/13 1634  BP: 120/64  Pulse: 101  Height: 5\' 3"  (1.6 m)  Weight: 111.585 kg (246 lb)  SpO2: 92%    Gen: Pleasant, overwt woman, in no distress,  normal affect  ENT: No lesions,  mouth clear,  oropharynx clear, no postnasal drip, evolving some cushingoid features  Neck: No JVD, no stridor  Lungs: No use of accessory muscles, clear without rales or rhonchi, no wheeze  Cardiovascular: RRR, heart sounds normal, no murmur or gallops, no peripheral edema  Musculoskeletal: No deformities,  no cyanosis or clubbing  Neuro: alert, non focal  Skin: Warm, no lesions or rashes   Testing:  BAL fungal smear 1/8 >> negative, cx pending BAL AFB smear 1/8 >> negative, cx pending BAL cytology 1/8 >> negative Brushings cytology 1/8 >> negative TBBx's path 1/8 >> negative RF, ANA, ANCA, 1/8 >> all negative  IgE 1/7 >> 55 (normal)   Eosinophil count 1/7 >> 0.4 (normal)  Fungal hypersensitivity panel 1/7 >> all negative Quantiferon gold 1/7 >> negative HIV 1/7 >> negative   10/27/13 --  COMPARISON: CT ANGIO CHEST W/CM &/OR WO/CM dated 09/02/2013  FINDINGS:  Mediastinal lymph nodes are not enlarged by CT size criteria. Hilar  regions are difficult to definitively evaluate without IV contrast.  No axillary adenopathy. Heart is enlarged. No pericardial effusion.  Mild mosaic attenuation. Scattered linear scarring. Previously seen  areas of nodular and masslike airspace consolidation have resolved  in the interval. Scattered pulmonary nodules measure 4 mm or less in  size and are unchanged. No pleural fluid. Airway is unremarkable.  Incidental imaging of the upper abdomen shows the visualized  portions of the liver , adrenal glands, kidneys, spleen, pancreas,  stomach and bowel to be grossly unremarkable. Cholecystectomy. No  upper abdominal adenopathy. No worrisome lytic or sclerotic lesions.  IMPRESSION:  1. Interval clearing of previously seen areas of nodular and  masslike airspace consolidation, most consistent with resolved  pneumonia.  2. Additional scattered pulmonary nodules measure 4 mm or less in  size and are unchanged. If the patient is at high risk for  bronchogenic carcinoma, follow-up chest CT at 1 year is recommended.  If the patient is at low risk, no follow-up is needed. This  recommendation follows the consensus statement: Guidelines for  Management of Small Pulmonary Nodules Detected on CT Scans: A  Statement from the [place] as published in Radiology  2005; 237:395-400.  3. Mild mosaic pulmonary parenchymal attenuation can be seen in the  setting of small airways disease       Assessment & Plan:  Pulmonary nodules/lesions, multiple With diffuse GGI and nodular disease that improved after steroids. Unfortunately her clinical status and hypoxemia did not improve along with the  radiographical changes. Unclear to me now whether her current sx are related in part to her 40 lb wt gain and deconditioning (+ back pain) or to her parenchymal process. 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   We will start Symbicort 2 puffs twice a day Please call our office to let us know how you have done on the Symbicort so we can order for you if you've benefited.  Wear your oxygen with exertion Continue to work with physical therapy for your back and to lose weight We may decide to consider lung biopsy in the future.  We will discuss the timing of repeating your CT scan at your next visit.  Follow with Dr Lamonte Sakai in 2 months or sooner if you have any problems.

## 2013-12-09 ENCOUNTER — Ambulatory Visit: Payer: 59

## 2013-12-16 ENCOUNTER — Ambulatory Visit: Payer: 59

## 2013-12-23 ENCOUNTER — Ambulatory Visit: Payer: 59

## 2013-12-23 ENCOUNTER — Encounter: Payer: Self-pay | Admitting: Emergency Medicine

## 2014-01-01 ENCOUNTER — Ambulatory Visit: Payer: 59 | Admitting: Physical Therapy

## 2014-01-05 ENCOUNTER — Ambulatory Visit: Payer: 59 | Attending: Rehabilitation | Admitting: Physical Therapy

## 2014-01-05 DIAGNOSIS — IMO0001 Reserved for inherently not codable concepts without codable children: Secondary | ICD-10-CM | POA: Insufficient documentation

## 2014-01-05 DIAGNOSIS — M545 Low back pain, unspecified: Secondary | ICD-10-CM | POA: Insufficient documentation

## 2014-01-08 ENCOUNTER — Ambulatory Visit: Payer: 59 | Admitting: Physical Therapy

## 2014-01-13 ENCOUNTER — Ambulatory Visit: Payer: 59 | Admitting: Physical Therapy

## 2014-01-15 ENCOUNTER — Ambulatory Visit: Payer: 59 | Admitting: Physical Therapy

## 2014-01-20 ENCOUNTER — Ambulatory Visit: Payer: 59 | Admitting: Physical Therapy

## 2014-01-22 ENCOUNTER — Ambulatory Visit: Payer: 59 | Admitting: Physical Therapy

## 2014-01-27 ENCOUNTER — Ambulatory Visit: Payer: 59 | Admitting: Physical Therapy

## 2014-01-29 ENCOUNTER — Ambulatory Visit: Payer: 59 | Admitting: Physical Therapy

## 2014-01-30 ENCOUNTER — Encounter: Payer: Self-pay | Admitting: Emergency Medicine

## 2014-01-30 ENCOUNTER — Ambulatory Visit (INDEPENDENT_AMBULATORY_CARE_PROVIDER_SITE_OTHER): Payer: 59 | Admitting: Emergency Medicine

## 2014-01-30 VITALS — HR 88 | Ht 63.0 in | Wt 245.0 lb

## 2014-01-30 DIAGNOSIS — R918 Other nonspecific abnormal finding of lung field: Secondary | ICD-10-CM

## 2014-01-30 NOTE — Assessment & Plan Note (Signed)
Etiology of her pneumonitis unclear, responded (on Ct scan) to pred. She remains dyspneic, continues to require O2. ? Whether this is the same process, ? Consider wt gain of 40 lbs from the pred.  - will repeat CT chest now - consider lung bx if her infiltrates have returned. I believe we need a tissue dx before treating with more prednisone.

## 2014-01-30 NOTE — Progress Notes (Signed)
Subjective:    Patient ID: Marissa Wilkerson, female    DOB: 1961-02-26, 53 y.o.   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 I saw her initially in consultation after admission to [facility] for hypoxemia 09/02/13. She had experienced dyspnea that has been insidious over about 3-4 weeks. She denied FCS or purulent sputum production. No recent URI or unusual exposures or travels. She was treated with HCTZ in 9/'14 by Dr Delfina Redwood when she developed significant LE edema. TTE from 10/14 showed no evidence of PAH or LV dysfxn. Following her presentation to [facility] CT-PA revealed Abnormal lungs with numerous indistinct bilateral pulmonary nodules (semi-solid appearing), with superimposed confluent mass like pulmonary opacity in the left lower lung crossing the major fissure. There was no PE.  We performed serologies and FOB during the admission. She was discharged to home on O2 with plan to f/u today. She feels some dyspnea w exertion. Continues to desat with exertion on RA.   ROV 10/22/13 -- follows up for dyspnea, hypoxemia, nodular disease on her Ct chest. After negative culture data on FOB we initiated steroid trial, 40mg  daily. Returns today  > no changes in her breathing but side effects > edema, wt gain of 30 lbs. CBG's have been higher. Her lowest SpO2 with exertion on RA has been 82%.   ROV 10/30/13 -- dyspnea, hypoxemia, nodular disease on CT chest. FOB negative. Trial steroids did not change her SOB. Repeat Ct scan >>  2//25/15 . She has seen documented desats with exertion. She is still wearing O2 w exertion. No cough or wheeze. She does have low back pain.   ROV 12/31/13 -- follow up for dyspnea, hypoxemia, nodular disease on CT chest. FOB negative for organisms. Her repeat CT scan > large scale resolution of the pneumonitis and nodular disease with some residual micro-nodular disease. She has gained  40 lbs on the pred > now off. She has documented exertional desats to 80's on RA. She hasn't really benefited from albuterol. She is doing physical therapy 2x a week.   ROV 01/30/14 -- follow up for dyspnea, hypoxemia, nodular disease on CT chest. Repeat Ct scan showed significant improvement in her pneumonitis. She returns describing . We did a trial of symbicort but she didn't benefit so stopped it. She is wearing 2L/min typically. She has lost 3 lbs since last time.  No cough, occasionally hears some wheeze. She is having GERD sx.     Review of Systems  Constitutional: Positive for activity change and unexpected weight change. Negative for fever.  HENT: Negative for congestion, dental problem, ear pain, nosebleeds, postnasal drip, rhinorrhea, sinus pressure, sneezing, sore throat and trouble swallowing.   Eyes: Negative for redness and itching.  Respiratory: Positive for shortness of breath. Negative for cough, chest tightness and wheezing.   Cardiovascular: Positive for leg swelling. Negative for palpitations.  Gastrointestinal: Negative for nausea and vomiting.  Genitourinary: Negative for dysuria.  Musculoskeletal: Positive for back pain. Negative for joint swelling.  Skin: Negative for rash.  Neurological: Negative for headaches.  Hematological: Does not bruise/bleed easily.  Psychiatric/Behavioral: Negative for dysphoric mood. The patient is not nervous/anxious.        Objective:   Physical Exam Filed Vitals:   01/30/14 1604  Pulse: 88  Height: 5\' 3"  (1.6 m)  Weight: 245 lb (111.131 kg)  SpO2: 94%    Gen:  Pleasant, overwt woman, in no distress,  normal affect  ENT: No lesions,  mouth clear,  oropharynx clear, no postnasal drip, evolving some cushingoid features  Neck: No JVD, no stridor  Lungs: No use of accessory muscles, clear without rales or rhonchi, no wheeze  Cardiovascular: RRR, heart sounds normal, no murmur or gallops, no peripheral edema  Musculoskeletal: No  deformities, no cyanosis or clubbing  Neuro: alert, non focal  Skin: Warm, no lesions or rashes   Testing:  BAL fungal smear 1/8 >> negative, cx pending BAL AFB smear 1/8 >> negative, cx pending BAL cytology 1/8 >> negative Brushings cytology 1/8 >> negative TBBx's path 1/8 >> negative RF, ANA, ANCA, 1/8 >> all negative  IgE 1/7 >> 55 (normal)  Eosinophil count 1/7 >> 0.4 (normal)  Fungal hypersensitivity panel 1/7 >> all negative Quantiferon gold 1/7 >> negative HIV 1/7 >> negative   10/27/13 --  COMPARISON: CT ANGIO CHEST W/CM &/OR WO/CM dated 09/02/2013  FINDINGS:  Mediastinal lymph nodes are not enlarged by CT size criteria. Hilar  regions are difficult to definitively evaluate without IV contrast.  No axillary adenopathy. Heart is enlarged. No pericardial effusion.  Mild mosaic attenuation. Scattered linear scarring. Previously seen  areas of nodular and masslike airspace consolidation have resolved  in the interval. Scattered pulmonary nodules measure 4 mm or less in  size and are unchanged. No pleural fluid. Airway is unremarkable.  Incidental imaging of the upper abdomen shows the visualized  portions of the liver , adrenal glands, kidneys, spleen, pancreas,  stomach and bowel to be grossly unremarkable. Cholecystectomy. No  upper abdominal adenopathy. No worrisome lytic or sclerotic lesions.  IMPRESSION:  1. Interval clearing of previously seen areas of nodular and  masslike airspace consolidation, most consistent with resolved  pneumonia.  2. Additional scattered pulmonary nodules measure 4 mm or less in  size and are unchanged. If the patient is at high risk for  bronchogenic carcinoma, follow-up chest CT at 1 year is recommended.  If the patient is at low risk, no follow-up is needed. This  recommendation follows the consensus statement: Guidelines for  Management of Small Pulmonary Nodules Detected on CT Scans: A  Statement from the [employer] as  published in Radiology  2005; 237:395-400.  3. Mild mosaic pulmonary parenchymal attenuation can be seen in the  setting of small airways disease       Assessment & Plan:  Pulmonary nodules/lesions, multiple Etiology of her pneumonitis unclear, responded (on Ct scan) to pred. She remains dyspneic, continues to require O2. ? Whether this is the same process, ? Consider wt gain of 40 lbs from the pred.  - will repeat CT chest now - consider lung bx if her infiltrates have returned. I believe we need a tissue dx before treating with more prednisone.

## 2014-01-30 NOTE — Patient Instructions (Signed)
We will repeat your CT scan chest to compare with prior Continue your current medications as you are taking them.  Follow with Dr Lamonte Sakai next available after your CT scan is done.

## 2014-02-02 ENCOUNTER — Ambulatory Visit: Payer: 59 | Admitting: Physical Therapy

## 2014-02-06 ENCOUNTER — Ambulatory Visit (INDEPENDENT_AMBULATORY_CARE_PROVIDER_SITE_OTHER)
Admission: RE | Admit: 2014-02-06 | Discharge: 2014-02-06 | Disposition: A | Discharge status: Home / Self Care | Payer: 59 | Source: Ambulatory Visit | Attending: Emergency Medicine | Admitting: Emergency Medicine

## 2014-02-06 DIAGNOSIS — R918 Other nonspecific abnormal finding of lung field: Secondary | ICD-10-CM

## 2014-02-09 ENCOUNTER — Encounter: Payer: Self-pay | Admitting: Emergency Medicine

## 2014-02-09 ENCOUNTER — Ambulatory Visit (INDEPENDENT_AMBULATORY_CARE_PROVIDER_SITE_OTHER): Payer: 59 | Admitting: Emergency Medicine

## 2014-02-09 VITALS — BP 122/70 | HR 68 | Ht 63.0 in | Wt 236.0 lb

## 2014-02-09 DIAGNOSIS — J449 Chronic obstructive pulmonary disease, unspecified: Secondary | ICD-10-CM | POA: Insufficient documentation

## 2014-02-09 DIAGNOSIS — R918 Other nonspecific abnormal finding of lung field: Secondary | ICD-10-CM

## 2014-02-09 MED ORDER — FLUTICASONE FUROATE-VILANTEROL 100-25 MCG/INH IN AEPB: 1.0000 | INHALATION_SPRAY | Freq: Every day | RESPIRATORY_TRACT | Status: DC

## 2014-02-09 NOTE — Assessment & Plan Note (Signed)
Her Ct scan from 02/06/14 shows some evolving nodules and nodular infiltrate. Clearly this is a steroid responsive process since her march '15 CT scan showed largescale resolution compared to her original hospitalization in January '15. At this point I believe we need to get a better bx (there were no granulomas on TBBx). I will refer her to [facility] to discuss VATS bx.

## 2014-02-09 NOTE — Assessment & Plan Note (Addendum)
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 Will try Breo and see if she notices a difference.

## 2014-02-09 NOTE — Progress Notes (Signed)
Subjective:    Patient ID: Marissa Wilkerson, female    DOB: 1961/01/30, 53 y.o.   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 I saw her initially in consultation after admission to [facility] for hypoxemia 09/02/13. She had experienced dyspnea that has been insidious over about 3-4 weeks. She denied FCS or purulent sputum production. No recent URI or unusual exposures or travels. She was treated with HCTZ in 9/'14 by Dr Delfina Redwood when she developed significant LE edema. TTE from 10/14 showed no evidence of PAH or LV dysfxn. Following her presentation to  [facility] CT-PA revealed Abnormal lungs with numerous indistinct bilateral pulmonary nodules (semi-solid appearing), with superimposed confluent mass like pulmonary opacity in the left lower lung crossing the major fissure. There was no PE.  We performed serologies and FOB during the admission. She was discharged to home on O2 with plan to f/u today. She feels some dyspnea w exertion. Continues to desat with exertion on RA.   ROV 10/22/13 -- follows up for dyspnea, hypoxemia, nodular disease on her Ct chest. After negative culture data on FOB we initiated steroid trial, 40mg  daily. Returns today  > no changes in her breathing but side effects > edema, wt gain of 30 lbs. CBG's have been higher. Her lowest SpO2 with exertion on RA has been 82%.   ROV 10/30/13 -- dyspnea, hypoxemia, nodular disease on CT chest. FOB negative. Trial steroids did not change her SOB. Repeat Ct scan >>  2//25/15 . She has seen documented desats with exertion. She is still wearing O2 w exertion. No cough or wheeze. She does have low back pain.   ROV 12/31/13 -- follow up for dyspnea, hypoxemia, nodular disease on CT chest. FOB negative for organisms. Her repeat CT scan > large scale resolution of the pneumonitis and nodular disease with some residual micro-nodular disease. She has gained  40 lbs on the pred > now off. She has documented exertional desats to 80's on RA. She hasn't really benefited from albuterol. She is doing physical therapy 2x a week.   ROV 01/30/14 -- follow up for dyspnea, hypoxemia, nodular disease on CT chest. Repeat Ct scan showed significant improvement in her pneumonitis. She returns describing stable dyspnea. We did a trial of symbicort but she didn't benefit so stopped it. She is wearing 2L/min typically. She has lost 3 lbs since last time.  No cough, occasionally hears some wheeze. She is having GERD sx.   ROV 02/09/14 -- dyspnea, hypoxemia, nodular disease on CT chest. Repeat Ct scan 11/14/13 showed significant improvement in her pneumonitis. Her dyspnea has not really improved despite steroids and the improved CT. She did gain wt on pred, but hard to ascribe all her SOB to this. We repeated CT 02/06/14 as below. She has wheeze but only rarely.     Review of Systems  Constitutional: Positive for activity change and unexpected weight change. Negative for fever.  HENT: Negative for congestion, dental problem, ear pain, nosebleeds, postnasal drip, rhinorrhea, sinus pressure, sneezing, sore throat and trouble swallowing.   Eyes: Negative for redness and itching.  Respiratory: Positive for shortness of breath. Negative for cough, chest tightness and wheezing.   Cardiovascular: Positive for leg swelling. Negative for palpitations.  Gastrointestinal: Negative for nausea and vomiting.  Genitourinary: Negative for dysuria.  Musculoskeletal: Positive for back pain. Negative for joint swelling.  Skin: Negative for rash.  Neurological: Negative for headaches.  Hematological: Does not bruise/bleed easily.  Psychiatric/Behavioral: Negative for dysphoric mood. The patient is not nervous/anxious.        Objective:   Physical Exam Filed Vitals:   02/09/14 1642  BP: 122/70  Pulse: 68  Height: 5\' 3"  (1.6 m)  Weight: 236 lb (107.049 kg)  SpO2: 93%    Gen:  Pleasant, overwt woman, in no distress,  normal affect  ENT: No lesions,  mouth clear,  oropharynx clear, no postnasal drip, evolving some cushingoid features  Neck: No JVD, no stridor  Lungs: No use of accessory muscles, clear without rales or rhonchi, no wheeze  Cardiovascular: RRR, heart sounds normal, no murmur or gallops, no peripheral edema  Musculoskeletal: No deformities, no cyanosis or clubbing  Neuro: alert, non focal  Skin: Warm, no lesions or rashes   Testing:  BAL fungal smear 1/8 >> negative, cx pending BAL AFB smear 1/8 >> negative, cx pending BAL cytology 1/8 >> negative Brushings cytology 1/8 >> negative TBBx's path 1/8 >> negative RF, ANA, ANCA, 1/8 >> all negative  IgE 1/7 >> 55 (normal)  Eosinophil count 1/7 >> 0.4 (normal)  Fungal hypersensitivity panel 1/7 >> all negative Quantiferon gold 1/7 >> negative HIV 1/7 >> negative   10/27/13 --  COMPARISON: CT ANGIO CHEST W/CM &/OR WO/CM dated 09/02/2013  FINDINGS:  Mediastinal lymph nodes are not enlarged by CT size criteria. Hilar  regions are difficult to definitively evaluate without IV contrast.  No axillary adenopathy. Heart is enlarged. No pericardial effusion.  Mild mosaic attenuation. Scattered linear scarring. Previously seen  areas of nodular and masslike airspace consolidation have resolved  in the interval. Scattered pulmonary nodules measure 4 mm or less in  size and are unchanged. No pleural fluid. Airway is unremarkable.  Incidental imaging of the upper abdomen shows the visualized  portions of the liver , adrenal glands, kidneys, spleen, pancreas,  stomach and bowel to be grossly unremarkable. Cholecystectomy. No  upper abdominal adenopathy. No worrisome lytic or sclerotic lesions.  IMPRESSION:  1. Interval clearing of previously seen areas of nodular and  masslike airspace consolidation, most consistent with resolved  pneumonia.  2. Additional scattered pulmonary nodules measure 4 mm or  less in  size and are unchanged. If the patient is at high risk for  bronchogenic carcinoma, follow-up chest CT at 1 year is recommended.  If the patient is at low risk, no follow-up is needed. This  recommendation follows the consensus statement: Guidelines for  Management of Small Pulmonary Nodules Detected on CT Scans: A  Statement from the [employer] as published in Radiology  2005; 237:395-400.  3. Mild mosaic pulmonary parenchymal attenuation can be seen in the  setting of small airways disease       Assessment & Plan:  Obstructive lung disease We have tried symbicort before without response, but given her AFL on PFT I would like to retry a bronchodilator. Will try Breo and see if she notices a difference.   Pulmonary nodules/lesions, multiple Her Ct scan from 02/06/14 shows some evolving nodules and nodular infiltrate. Clearly this is a steroid responsive process since her march '15 CT scan showed largescale resolution compared to her original hospitalization in January '15. At this point I believe we need to get a better bx (there were no granulomas on TBBx). I will refer her to [facility] to discuss VATS bx.

## 2014-02-09 NOTE — Patient Instructions (Signed)
Please start breo once a day to see if you benefit We will refer you to see Cardiothoracic Surgery to discuss a VATS lung biopsy Follow with Dr Lamonte Sakai in 6-8 weeks or sooner if you have any problems.

## 2014-02-18 ENCOUNTER — Encounter: Payer: 59 | Admitting: Surgery

## 2014-02-20 ENCOUNTER — Encounter: Payer: Self-pay | Admitting: Surgery

## 2014-02-20 ENCOUNTER — Institutional Professional Consult (permissible substitution) (INDEPENDENT_AMBULATORY_CARE_PROVIDER_SITE_OTHER): Payer: 59 | Admitting: Surgery

## 2014-02-20 VITALS — BP 149/71 | HR 71 | Resp 16 | Ht 63.0 in | Wt 237.0 lb

## 2014-02-20 DIAGNOSIS — R918 Other nonspecific abnormal finding of lung field: Secondary | ICD-10-CM

## 2014-02-20 NOTE — Progress Notes (Signed)
Cardiothoracic Surgery Consultation    PCP is Kandice Hams, MD Referring Provider is Collene Gobble, MD  Chief Complaint  Patient presents with  . 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 She was admitted to [facility] in January 2015 with hypoxemia and shortness of breath that developed over a 3-4 week period. Her CT showed numerous bilateral pulmonary nodules that were semisolid with a confluent mass-like opacity in the left lower lung crossing the major fissure. It was negative for PE. Serologies and FOB were negative. She was discharged on oxygen and continued to have some dyspnea with exertion and desaturation on RA. She was given a trial of Prednisone 40 mg per day which did not improve her breathing but did make her gain 30 lbs. The steroids were associated with significant improvement in her pneumonitis. Her most recent CT from 02/06/2014 showed recurrent development of multiple pulmonary nodules scattered throughout the lungs bilaterally, the largest of which appears in the superior segment of the left lower lobe.  Past Medical History  Diagnosis Date  . Hypertension   . Diabetes mellitus without complication   . GERD (gastroesophageal reflux disease)   . Arthritis   . Diastolic dysfunction     followed by dr polite found on echo 06-12-13 , grade 1 per pt  . 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 Dyslipidemia     Past Surgical History  Procedure Laterality Date  . Cholecystectomy  1996  . Wisdom tooth extraction  yrs ago  . All teeth extraction  7-8 yrs ago  . Colonoscopy with propofol N/A 08/05/2013    Procedure: COLONOSCOPY WITH PROPOFOL;  Surgeon: Garlan Fair, MD;  Location: [facility];  Service: Endoscopy;   Laterality: N/A;  . Video bronchoscopy Bilateral 09/04/2013    Procedure: VIDEO BRONCHOSCOPY WITH FLUORO;  Surgeon: Collene Gobble, MD;  Location: [facility];  Service: Cardiopulmonary;  Laterality: Bilateral;    Family History  Problem Relation Age of Onset  . Heart failure Mother   . Diabetes Father   . Hypertension Father   . Diabetes Brother   . Hypertension Brother     Social History History  Substance Use Topics  . Smoking status: Former Smoker -- 0.50 packs/day for 25 years    Types: Cigarettes    Quit date: 08/21/2012  . Smokeless tobacco: Never Used  . Alcohol Use: Yes     Comment: very rare    Current Outpatient Prescriptions  Medication Sig Dispense Refill  . albuterol (PROVENTIL HFA;VENTOLIN HFA) 108 (90 BASE) MCG/ACT inhaler Inhale 2 puffs into the lungs every 6 (six) hours as needed for wheezing or shortness of breath.  1 Inhaler  6  . aspirin EC 81 MG tablet Take 81 mg by mouth daily.      [name] atorvastatin (LIPITOR) 10 MG tablet Take 10 mg by mouth at bedtime.      . Fluticasone Furoate-Vilanterol (BREO ELLIPTA) 100-25 MCG/INH AEPB Inhale 1 puff into the lungs daily.  1 each  0  . furosemide (LASIX) 40 MG tablet Take 80 mg by mouth daily.      [name] HYDROcodone-acetaminophen (NORCO/VICODIN) 5-325 MG per tablet Take 1 tablet by mouth every  12 (twelve) hours.       . insulin aspart (NOVOLOG) 100 UNIT/ML injection Inject 20 Units into the skin 3 (three) times daily before meals. Sliding scale      . insulin glargine (LANTUS) 100 UNIT/ML injection Inject 350 Units into the skin at bedtime.       [name] lisinopril (PRINIVIL,ZESTRIL) 10 MG tablet Take 1 tablet (10 mg total) by mouth daily at 6 PM.  30 tablet  0  . Multiple Vitamins-Minerals (CENTRUM ADULTS) TABS Take 1 tablet by mouth daily.      . potassium chloride (K-DUR,KLOR-CON) 10 MEQ tablet Take 1 tablet (10 mEq total) by mouth daily.  30 tablet  0  . sitaGLIPtin (JANUVIA) 100 MG tablet Take 100 mg by mouth at bedtime.         No current facility-administered medications for this visit.    Allergies  Allergen Reactions  . Metformin And Related Nausea And Vomiting  . Sulfa Antibiotics Rash    Review of Systems  Constitutional: Positive for unexpected weight change.       Weight gain on prednisone  HENT:       Wears dentures  Eyes: Positive for visual disturbance.  Respiratory: Positive for shortness of breath. Negative for cough.   Cardiovascular: Positive for leg swelling. Negative for chest pain.  Gastrointestinal: Negative.   Genitourinary: Negative.   Musculoskeletal: Positive for arthralgias.  Allergic/Immunologic: Negative.   Neurological:       Neuropathy  Hematological: Negative.   Psychiatric/Behavioral: Negative.     BP 149/71  Pulse 71  Resp 16  Ht 5\' 3"  (1.6 m)  Wt 237 lb (107.502 kg)  BMI 41.99 kg/m2  SpO2 94%  LMP 08/28/2008 Physical Exam  Constitutional: She is oriented to person, place, and time.  Obese WF in no distress on nasal oxygen  HENT:  Head: Normocephalic and atraumatic.  Mouth/Throat: Oropharynx is clear and moist.  Eyes: EOM are normal.  Neck: Normal range of motion. No JVD present. No tracheal deviation present. No thyromegaly present.  Cardiovascular: Normal rate, regular rhythm, normal heart sounds and intact distal pulses.   No murmur heard. Pulmonary/Chest: Effort normal and breath sounds normal. No respiratory distress. She has no wheezes. She has no rales. She exhibits no tenderness.  Abdominal: Soft. Bowel sounds are normal. She exhibits no distension. There is no tenderness.  Musculoskeletal: Normal range of motion. She exhibits edema.  Lymphadenopathy:    She has no cervical adenopathy.  Neurological: She is alert and oriented to person, place, and time. She has normal strength. No cranial nerve deficit.  Skin: Skin is warm and dry.  Psychiatric: She has a normal mood and affect.     Diagnostic Tests:  CLINICAL DATA: Shortness breath.  EXAM:   CT CHEST WITHOUT CONTRAST  TECHNIQUE:  Multidetector CT imaging of the chest was performed following the  standard protocol without intravenous contrast. High resolution  imaging of the lungs, as well as inspiratory and expiratory imaging,  was performed.  COMPARISON: Chest CT 10/27/2013.  FINDINGS:  Mediastinum: Heart size is normal. Small amount of pericardial fluid  and/or thickening, unlikely to be of hemodynamic significance at  this time. No associated pericardial calcification. Multiple  borderline enlarged and mildly enlarged mediastinal and hilar lymph  nodes are noted, largest of which are in the prevascular station  measuring 11 mm (image 16 of series 2), low right paratracheal  station measuring 11 mm (image 20 of series 2), and subcarinal  station measuring  11 mm (image 24 of series 2). Esophagus is  unremarkable in appearance.  Lungs/Pleura: Multiple aggressive appearing nodules are seen  scattered throughout the lungs bilaterally, largest of which is in  the superior segment of the left lower lobe measuring 2.2 x 1.4 cm.  This nodule has macrolobulated and slightly spiculated margins with  retraction of the superior aspect of the right major fissure. The  largest nodule in the right lung is also in the superior segment of  the right lower lobe measuring up to 11 mm. Several of the smaller  nodules are unchanged in size compared to the prior examination and  are likely unrelated to the new nodules which are presumably an  acute infectious process, as neoplasm would be unlikely to develop  in a 4 month time interval, and would be unlikely to have a slight  upper lung predominance as is seen in this entity. No pleural  effusions. Linear scarring in the upper lobes of the lungs  bilaterally and in the inferior segment of the lingula and lateral  segment of the right middle lobe, similar to the prior study. Mosaic  attenuation is again noted throughout the lungs  bilaterally, again  suggestive of air trapping from small airways disease.  High-resolution images demonstrate no areas of significant  subpleural reticulation, traction bronchiectasis or frank  honeycombing to suggest an underlying interstitial lung disease.  Upper Abdomen: Status post cholecystectomy.  Musculoskeletal: There are no aggressive appearing lytic or blastic  lesions noted in the visualized portions of the skeleton.  IMPRESSION:  1. Recurrent development of multiple aggressive appearing pulmonary  nodules scattered throughout the lungs bilaterally, with slight mid  to upper lung predominance, most compatible with an atypical  infectious process.  2. Reactive mediastinal and hilar adenopathy, as above.  3. No evidence of underlying interstitial lung disease at this time.  4. Air trapping redemonstrated, indicative of small airways disease.  5. Additional incidental findings, as above.  Electronically Signed  By: Vinnie Langton M.D.  On: 02/06/2014 17:31   Impression:  She has bilateral pulmonary nodules, dyspnea and hypoxemia. The nodules markedly improved with steroids but her shortness of breath and hypoxemia did not. The nodules have recurred off steroids and it is felt that an open lung biopsy is needed to aid in diagnosis. The superior segment of the left lower lobe appears to be the most involved with the largest nodule there. I think a left VATS approach will probably allow a wedge resection of the superior segment. I discussed the procedure with her and her husband including alternatives, benefits and risks including but not limited to bleeding, infection, injury to the lung, prolonged air leak, and the inability to make a firm diagnosis even after wedge resection lung biopsy. She understands and agrees to proceed.  Plan:  She will be scheduled for left VATS for wedge resection biopsy of the superior segment of the left lower lobe on 03/05/2014.

## 2014-02-23 ENCOUNTER — Other Ambulatory Visit: Payer: Self-pay

## 2014-02-23 ENCOUNTER — Telehealth: Payer: Self-pay

## 2014-02-23 DIAGNOSIS — R918 Other nonspecific abnormal finding of lung field: Secondary | ICD-10-CM

## 2014-02-24 ENCOUNTER — Encounter: Payer: Self-pay | Admitting: Emergency Medicine

## 2014-02-24 ENCOUNTER — Encounter: Payer: Self-pay | Admitting: Surgery

## 2014-02-24 NOTE — Telephone Encounter (Signed)
Erroneous

## 2014-02-25 NOTE — Telephone Encounter (Signed)
Pt stopped Breo, states that she is not seeing any results from this. Any recs on what she can try now?  Pt also wanted to give FYI : VATS lung biopsy is scheduled for Thursday, July 9th.  Please advise Dr Lamonte Sakai. Thanks.

## 2014-02-26 ENCOUNTER — Encounter (HOSPITAL_COMMUNITY): Payer: Self-pay | Admitting: Pharmacy Technician

## 2014-03-03 ENCOUNTER — Encounter (HOSPITAL_COMMUNITY): Payer: Self-pay

## 2014-03-03 ENCOUNTER — Encounter (HOSPITAL_COMMUNITY)
Admission: RE | Admit: 2014-03-03 | Discharge: 2014-03-03 | Disposition: A | Discharge status: Home / Self Care | Payer: 59 | Source: Ambulatory Visit | Attending: Surgery | Admitting: Surgery

## 2014-03-03 ENCOUNTER — Other Ambulatory Visit: Payer: Self-pay

## 2014-03-03 VITALS — BP 147/80 | HR 84 | Temp 98.7°F | Resp 22 | Ht 63.0 in | Wt 250.6 lb

## 2014-03-03 DIAGNOSIS — R918 Other nonspecific abnormal finding of lung field: Secondary | ICD-10-CM

## 2014-03-03 HISTORY — DX: Wheezing: R06.2

## 2014-03-03 HISTORY — DX: Localized edema: R60.0

## 2014-03-03 HISTORY — DX: Shortness of breath: R06.02

## 2014-03-03 HISTORY — DX: Benign paroxysmal vertigo, unspecified ear: H81.10

## 2014-03-03 LAB — COMPREHENSIVE METABOLIC PANEL
ALK PHOS: 110 U/L (ref 39–117)
ALT: 31 U/L (ref 0–35)
AST: 25 U/L (ref 0–37)
Albumin: 3.1 g/dL — ABNORMAL LOW (ref 3.5–5.2)
Anion gap: 16 — ABNORMAL HIGH (ref 5–15)
BILIRUBIN TOTAL: 0.3 mg/dL (ref 0.3–1.2)
BUN: 15 mg/dL (ref 6–23)
CHLORIDE: 99 meq/L (ref 96–112)
CO2: 23 mEq/L (ref 19–32)
Calcium: 9.4 mg/dL (ref 8.4–10.5)
Creatinine, Ser: 0.64 mg/dL (ref 0.50–1.10)
GFR calc Af Amer: >90 mL/min (ref >90)
GFR calc non Af Amer: >90 mL/min (ref >90)
Glucose, Bld: 183 mg/dL — ABNORMAL HIGH (ref 70–99)
POTASSIUM: 4.2 meq/L (ref 3.7–5.3)
Sodium: 138 mEq/L (ref 137–147)
TOTAL PROTEIN: 7.5 g/dL (ref 6.0–8.3)

## 2014-03-03 LAB — BLOOD GAS, ARTERIAL
Acid-Base Excess: 3.4 mmol/L — ABNORMAL HIGH (ref 0.0–2.0)
BICARBONATE: 27.6 meq/L — AB (ref 20.0–24.0)
Drawn by: 344381
O2 Content: 3 L/min
O2 Saturation: 99.1 %
PCO2 ART: 42.7 mmHg (ref 35.0–45.0)
Patient temperature: 98.6
TCO2: 28.9 mmol/L (ref 0–100)
pH, Arterial: 7.426 (ref 7.350–7.450)
pO2, Arterial: 88.9 mmHg (ref 80.0–100.0)

## 2014-03-03 LAB — CBC
HEMATOCRIT: 34.2 % — AB (ref 36.0–46.0)
Hemoglobin: 10.9 g/dL — ABNORMAL LOW (ref 12.0–15.0)
MCH: 29.5 pg (ref 26.0–34.0)
MCHC: 31.9 g/dL (ref 30.0–36.0)
MCV: 92.4 fL (ref 78.0–100.0)
Platelets: 230 10*3/uL (ref 150–400)
RBC: 3.7 MIL/uL — ABNORMAL LOW (ref 3.87–5.11)
RDW: 15.4 % (ref 11.5–15.5)
WBC: 6.2 10*3/uL (ref 4.0–10.5)

## 2014-03-03 LAB — URINE MICROSCOPIC-ADD ON

## 2014-03-03 LAB — URINALYSIS, ROUTINE W REFLEX MICROSCOPIC
Bilirubin Urine: NEGATIVE
Glucose, UA: 500 mg/dL — AB
Ketones, ur: NEGATIVE mg/dL
NITRITE: POSITIVE — AB
PROTEIN: 100 mg/dL — AB
SPECIFIC GRAVITY, URINE: 1.024 (ref 1.005–1.030)
UROBILINOGEN UA: 1 mg/dL (ref 0.0–1.0)
pH: 6 (ref 5.0–8.0)

## 2014-03-03 LAB — TYPE AND SCREEN
ABO/RH(D): B POS
ANTIBODY SCREEN: NEGATIVE

## 2014-03-03 LAB — SURGICAL PCR SCREEN
MRSA, PCR: NEGATIVE
Staphylococcus aureus: POSITIVE — AB

## 2014-03-03 LAB — PROTIME-INR
INR: 1.11 (ref 0.00–1.49)
Prothrombin Time: 14.3 seconds (ref 11.6–15.2)

## 2014-03-03 LAB — ABO/RH: ABO/RH(D): B POS

## 2014-03-03 LAB — APTT: APTT: 30 s (ref 24–37)

## 2014-03-03 NOTE — Pre-Procedure Instructions (Signed)
HENNIE MCCLYMONT  03/03/2014   Your procedure is scheduled on:  Thursday  03/05/14  Report to [facility] Admitting at 1030 AM.  Call this number if you have problems the morning of surgery: [phone]   Remember:   Do not eat food or drink liquids after midnight.   Take these medicines the morning of surgery with A SIP OF WATER: ALBUTEROL, GABAPENTIN, HYDROCODONE IF NEEDED, POTASSIUM, RANITIDINE(ZANTAC)   Do not wear jewelry, make-up or nail polish.  Do not wear lotions, powders, or perfumes. You may wear deodorant.  Do not shave 48 hours prior to surgery. Men may shave face and neck.  Do not bring valuables to the hospital.  [facility] is not responsible                  for any belongings or valuables.               Contacts, dentures or bridgework may not be worn into surgery.  Leave suitcase in the car. After surgery it may be brought to your room.  For patients admitted to the hospital, discharge time is determined by your                treatment team.               Patients discharged the day of surgery will not be allowed to drive  home.  Name and phone number of your driver:   Special Instructions: SEE PREPARING FOR SURGERY   Please read over the following fact sheets that you were given: Pain Booklet, Coughing and Deep Breathing, Blood Transfusion Information, MRSA Information and Surgical Site Infection Prevention

## 2014-03-03 NOTE — Progress Notes (Signed)
03/03/14 0917  OBSTRUCTIVE SLEEP APNEA  Have you ever been diagnosed with sleep apnea through a sleep study? No  Do you snore loudly (loud enough to be heard through closed doors)?  0  Do you often feel tired, fatigued, or sleepy during the daytime? 0  Has anyone observed you stop breathing during your sleep? 0  Do you have, or are you being treated for high blood pressure? 1  BMI more than 35 kg/m2? 1  Age over 52 years old? 1  Neck circumference greater than 40 cm/16 inches? 1  Gender: 0  Obstructive Sleep Apnea Score 4  Score 4 or greater  Results sent to PCP

## 2014-03-03 NOTE — Progress Notes (Signed)
Patient stated she was told by Dr. Cyndia Bent she could continue her aspirin 81 mg.

## 2014-03-04 MED ORDER — DEXTROSE 5 % IV SOLN
1.5000 g | INTRAVENOUS | Status: AC
  Administered 2014-03-05: 1.5 g via INTRAVENOUS
  Filled 2014-03-04: qty 1.5

## 2014-03-04 NOTE — Progress Notes (Signed)
Left message on pt's cell # for time change. Instructed pt to be here at 9:50 AM tomorrow.

## 2014-03-05 ENCOUNTER — Inpatient Hospital Stay (HOSPITAL_COMMUNITY): Payer: 59 | Admitting: Certified Registered"

## 2014-03-05 ENCOUNTER — Encounter (HOSPITAL_COMMUNITY): Payer: Self-pay

## 2014-03-05 ENCOUNTER — Inpatient Hospital Stay (HOSPITAL_COMMUNITY): Payer: 59

## 2014-03-05 ENCOUNTER — Encounter (HOSPITAL_COMMUNITY): Payer: 59 | Admitting: Certified Registered"

## 2014-03-05 ENCOUNTER — Encounter (HOSPITAL_COMMUNITY)
Admission: RE | Disposition: A | Discharge status: Home / Self Care | Payer: Self-pay | Source: Ambulatory Visit | Attending: Surgery

## 2014-03-05 ENCOUNTER — Inpatient Hospital Stay (HOSPITAL_COMMUNITY)
Admission: RE | Admit: 2014-03-05 | Discharge: 2014-03-08 | DRG: 167 | Disposition: A | Discharge status: Home / Self Care | Payer: 59 | Source: Ambulatory Visit | Attending: Surgery | Admitting: Surgery

## 2014-03-05 DIAGNOSIS — E119 Type 2 diabetes mellitus without complications: Secondary | ICD-10-CM | POA: Diagnosis present

## 2014-03-05 DIAGNOSIS — R918 Other nonspecific abnormal finding of lung field: Principal | ICD-10-CM | POA: Diagnosis present

## 2014-03-05 DIAGNOSIS — E669 Obesity, unspecified: Secondary | ICD-10-CM | POA: Diagnosis present

## 2014-03-05 DIAGNOSIS — Z794 Long term (current) use of insulin: Secondary | ICD-10-CM

## 2014-03-05 DIAGNOSIS — Z9981 Dependence on supplemental oxygen: Secondary | ICD-10-CM

## 2014-03-05 DIAGNOSIS — Z6841 Body Mass Index (BMI) 40.0 and over, adult: Secondary | ICD-10-CM

## 2014-03-05 DIAGNOSIS — M129 Arthropathy, unspecified: Secondary | ICD-10-CM | POA: Diagnosis present

## 2014-03-05 DIAGNOSIS — K219 Gastro-esophageal reflux disease without esophagitis: Secondary | ICD-10-CM | POA: Diagnosis present

## 2014-03-05 DIAGNOSIS — E785 Hyperlipidemia, unspecified: Secondary | ICD-10-CM | POA: Diagnosis present

## 2014-03-05 DIAGNOSIS — I10 Essential (primary) hypertension: Secondary | ICD-10-CM | POA: Diagnosis present

## 2014-03-05 DIAGNOSIS — Z7982 Long term (current) use of aspirin: Secondary | ICD-10-CM

## 2014-03-05 DIAGNOSIS — Z87891 Personal history of nicotine dependence: Secondary | ICD-10-CM

## 2014-03-05 HISTORY — PX: LUNG BIOPSY: SHX5088

## 2014-03-05 HISTORY — PX: VIDEO ASSISTED THORACOSCOPY: SHX5073

## 2014-03-05 LAB — GLUCOSE, CAPILLARY
GLUCOSE-CAPILLARY: 153 mg/dL — AB (ref 70–99)
Glucose-Capillary: 123 mg/dL — ABNORMAL HIGH (ref 70–99)
Glucose-Capillary: 135 mg/dL — ABNORMAL HIGH (ref 70–99)
Glucose-Capillary: 162 mg/dL — ABNORMAL HIGH (ref 70–99)
Glucose-Capillary: 262 mg/dL — ABNORMAL HIGH (ref 70–99)

## 2014-03-05 SURGERY — VIDEO ASSISTED THORACOSCOPY
Anesthesia: General | Site: Chest | Laterality: Left

## 2014-03-05 MED ORDER — PHENYLEPHRINE HCL 10 MG/ML IJ SOLN
INTRAMUSCULAR | Status: AC
  Filled 2014-03-05: qty 1

## 2014-03-05 MED ORDER — POTASSIUM CHLORIDE 10 MEQ/50ML IV SOLN
10.0000 meq | Freq: Every day | INTRAVENOUS | Status: DC | PRN
Start: 2014-03-05 — End: 2014-03-08
  Filled 2014-03-05: qty 50

## 2014-03-05 MED ORDER — EPHEDRINE SULFATE 50 MG/ML IJ SOLN
INTRAMUSCULAR | Status: AC
  Filled 2014-03-05: qty 1

## 2014-03-05 MED ORDER — LEVALBUTEROL HCL 0.63 MG/3ML IN NEBU
0.6300 mg | INHALATION_SOLUTION | Freq: Four times a day (QID) | RESPIRATORY_TRACT | Status: DC
  Administered 2014-03-05 – 2014-03-06 (×2): 0.63 mg via RESPIRATORY_TRACT
  Filled 2014-03-05 (×7): qty 3

## 2014-03-05 MED ORDER — SODIUM CHLORIDE 0.9 % IV SOLN
INTRAVENOUS | Status: DC
  Administered 2014-03-05: 100 mL/h via INTRAVENOUS
  Administered 2014-03-06: 05:00:00 via INTRAVENOUS

## 2014-03-05 MED ORDER — 0.9 % SODIUM CHLORIDE (POUR BTL) OPTIME
TOPICAL | Status: DC | PRN
  Administered 2014-03-05: 2000 mL

## 2014-03-05 MED ORDER — ASPIRIN EC 81 MG PO TBEC
81.0000 mg | DELAYED_RELEASE_TABLET | Freq: Every day | ORAL | Status: DC
  Administered 2014-03-05 – 2014-03-07 (×3): 81 mg via ORAL
  Filled 2014-03-05 (×4): qty 1

## 2014-03-05 MED ORDER — MIDAZOLAM HCL 2 MG/2ML IJ SOLN
INTRAMUSCULAR | Status: AC
  Administered 2014-03-05: 2 mg
  Filled 2014-03-05: qty 2

## 2014-03-05 MED ORDER — LACTATED RINGERS IV SOLN
INTRAVENOUS | Status: DC
  Administered 2014-03-05 (×3): via INTRAVENOUS

## 2014-03-05 MED ORDER — NALOXONE HCL 0.4 MG/ML IJ SOLN: 0.4000 mg | INTRAMUSCULAR | Status: DC | PRN

## 2014-03-05 MED ORDER — SUCCINYLCHOLINE CHLORIDE 20 MG/ML IJ SOLN
INTRAMUSCULAR | Status: AC
  Filled 2014-03-05: qty 1

## 2014-03-05 MED ORDER — PHENYLEPHRINE HCL 10 MG/ML IJ SOLN
INTRAMUSCULAR | Status: DC | PRN
  Administered 2014-03-05 (×2): 200 ug via INTRAVENOUS

## 2014-03-05 MED ORDER — ATORVASTATIN CALCIUM 10 MG PO TABS
10.0000 mg | ORAL_TABLET | Freq: Every day | ORAL | Status: DC
  Administered 2014-03-06 – 2014-03-07 (×2): 10 mg via ORAL
  Filled 2014-03-05 (×4): qty 1

## 2014-03-05 MED ORDER — FENTANYL CITRATE 0.05 MG/ML IJ SOLN
INTRAMUSCULAR | Status: AC
  Filled 2014-03-05: qty 5

## 2014-03-05 MED ORDER — METOCLOPRAMIDE HCL 5 MG/ML IJ SOLN
10.0000 mg | Freq: Four times a day (QID) | INTRAMUSCULAR | Status: DC
  Administered 2014-03-05 – 2014-03-07 (×8): 10 mg via INTRAVENOUS
  Filled 2014-03-05 (×15): qty 2

## 2014-03-05 MED ORDER — DEXTROSE 5 % IV SOLN
1.5000 g | Freq: Two times a day (BID) | INTRAVENOUS | Status: AC
  Administered 2014-03-05 – 2014-03-06 (×2): 1.5 g via INTRAVENOUS
  Filled 2014-03-05 (×2): qty 1.5

## 2014-03-05 MED ORDER — GLYCOPYRROLATE 0.2 MG/ML IJ SOLN
INTRAMUSCULAR | Status: AC
  Filled 2014-03-05: qty 4

## 2014-03-05 MED ORDER — HYDROMORPHONE HCL PF 1 MG/ML IJ SOLN
INTRAMUSCULAR | Status: AC
  Filled 2014-03-05: qty 1

## 2014-03-05 MED ORDER — ONDANSETRON HCL 4 MG/2ML IJ SOLN: 4.0000 mg | Freq: Four times a day (QID) | INTRAMUSCULAR | Status: DC | PRN

## 2014-03-05 MED ORDER — OXYCODONE HCL 5 MG PO TABS: 5.0000 mg | ORAL_TABLET | Freq: Once | ORAL | Status: DC | PRN

## 2014-03-05 MED ORDER — HYDROMORPHONE 0.3 MG/ML IV SOLN
INTRAVENOUS | Status: DC
  Administered 2014-03-05: 0.6 mg via INTRAVENOUS
  Administered 2014-03-05: 16:00:00 via INTRAVENOUS
  Administered 2014-03-06 (×2): 0.9 mg via INTRAVENOUS
  Administered 2014-03-06: 0.6 mg via INTRAVENOUS

## 2014-03-05 MED ORDER — VECURONIUM BROMIDE 10 MG IV SOLR
INTRAVENOUS | Status: AC
  Filled 2014-03-05: qty 10

## 2014-03-05 MED ORDER — ENOXAPARIN SODIUM 40 MG/0.4ML ~~LOC~~ SOLN
40.0000 mg | Freq: Every day | SUBCUTANEOUS | Status: DC
  Administered 2014-03-06 – 2014-03-07 (×2): 40 mg via SUBCUTANEOUS
  Filled 2014-03-05 (×3): qty 0.4

## 2014-03-05 MED ORDER — STERILE WATER FOR INJECTION IJ SOLN
INTRAMUSCULAR | Status: AC
  Filled 2014-03-05: qty 10

## 2014-03-05 MED ORDER — NEOSTIGMINE METHYLSULFATE 10 MG/10ML IV SOLN
INTRAVENOUS | Status: DC | PRN
  Administered 2014-03-05: 5 mg via INTRAVENOUS

## 2014-03-05 MED ORDER — GABAPENTIN 300 MG PO CAPS
300.0000 mg | ORAL_CAPSULE | Freq: Two times a day (BID) | ORAL | Status: DC
  Administered 2014-03-05 – 2014-03-07 (×3): 300 mg via ORAL
  Filled 2014-03-05 (×7): qty 1

## 2014-03-05 MED ORDER — SODIUM CHLORIDE 0.9 % IJ SOLN: 9.0000 mL | INTRAMUSCULAR | Status: DC | PRN

## 2014-03-05 MED ORDER — TRAMADOL HCL 50 MG PO TABS: 50.0000 mg | ORAL_TABLET | Freq: Four times a day (QID) | ORAL | Status: DC | PRN

## 2014-03-05 MED ORDER — SUCCINYLCHOLINE CHLORIDE 20 MG/ML IJ SOLN
INTRAMUSCULAR | Status: DC | PRN
  Administered 2014-03-05: 120 mg via INTRAVENOUS

## 2014-03-05 MED ORDER — BISACODYL 5 MG PO TBEC
10.0000 mg | DELAYED_RELEASE_TABLET | Freq: Every day | ORAL | Status: DC
  Administered 2014-03-05 – 2014-03-06 (×2): 10 mg via ORAL
  Filled 2014-03-05 (×2): qty 2

## 2014-03-05 MED ORDER — ROCURONIUM BROMIDE 100 MG/10ML IV SOLN
INTRAVENOUS | Status: DC | PRN
  Administered 2014-03-05: 30 mg via INTRAVENOUS
  Administered 2014-03-05: 20 mg via INTRAVENOUS

## 2014-03-05 MED ORDER — DIPHENHYDRAMINE HCL 12.5 MG/5ML PO ELIX
12.5000 mg | ORAL_SOLUTION | Freq: Four times a day (QID) | ORAL | Status: DC | PRN
  Filled 2014-03-05: qty 5

## 2014-03-05 MED ORDER — FENTANYL CITRATE 0.05 MG/ML IJ SOLN
INTRAMUSCULAR | Status: DC | PRN
  Administered 2014-03-05: 50 ug via INTRAVENOUS
  Administered 2014-03-05 (×2): 100 ug via INTRAVENOUS

## 2014-03-05 MED ORDER — MIDAZOLAM HCL 2 MG/2ML IJ SOLN
INTRAMUSCULAR | Status: AC
  Filled 2014-03-05: qty 2

## 2014-03-05 MED ORDER — MUPIROCIN 2 % EX OINT
1.0000 | TOPICAL_OINTMENT | Freq: Two times a day (BID) | CUTANEOUS | Status: DC
Start: 2014-03-05 — End: 2014-03-08
  Administered 2014-03-05 – 2014-03-07 (×5): 1 via NASAL
  Filled 2014-03-05: qty 22

## 2014-03-05 MED ORDER — MIDAZOLAM HCL 5 MG/5ML IJ SOLN
INTRAMUSCULAR | Status: DC | PRN
Start: 2014-03-05 — End: 2014-03-05
  Administered 2014-03-05: 2 mg via INTRAVENOUS

## 2014-03-05 MED ORDER — SENNOSIDES-DOCUSATE SODIUM 8.6-50 MG PO TABS
1.0000 | ORAL_TABLET | Freq: Every day | ORAL | Status: DC
  Administered 2014-03-05: 1 via ORAL
  Filled 2014-03-05 (×4): qty 1

## 2014-03-05 MED ORDER — INSULIN ASPART 100 UNIT/ML ~~LOC~~ SOLN
0.0000 [IU] | SUBCUTANEOUS | Status: DC
  Administered 2014-03-05: 2 [IU] via SUBCUTANEOUS
  Administered 2014-03-05: 12 [IU] via SUBCUTANEOUS
  Administered 2014-03-06: 2 [IU] via SUBCUTANEOUS
  Administered 2014-03-06: 8 [IU] via SUBCUTANEOUS
  Administered 2014-03-06: 4 [IU] via SUBCUTANEOUS
  Administered 2014-03-06: 2 [IU] via SUBCUTANEOUS
  Administered 2014-03-06: 8 [IU] via SUBCUTANEOUS
  Administered 2014-03-06: 4 [IU] via SUBCUTANEOUS
  Administered 2014-03-07: 8 [IU] via SUBCUTANEOUS
  Administered 2014-03-07: 4 [IU] via SUBCUTANEOUS

## 2014-03-05 MED ORDER — VECURONIUM BROMIDE 10 MG IV SOLR
INTRAVENOUS | Status: DC | PRN
Start: 2014-03-05 — End: 2014-03-05
  Administered 2014-03-05 (×2): 1 mg via INTRAVENOUS

## 2014-03-05 MED ORDER — PROPOFOL 10 MG/ML IV BOLUS
INTRAVENOUS | Status: AC
  Filled 2014-03-05: qty 20

## 2014-03-05 MED ORDER — ROCURONIUM BROMIDE 50 MG/5ML IV SOLN
INTRAVENOUS | Status: AC
  Filled 2014-03-05: qty 1

## 2014-03-05 MED ORDER — GLYCOPYRROLATE 0.2 MG/ML IJ SOLN
INTRAMUSCULAR | Status: DC | PRN
  Administered 2014-03-05: .8 mg via INTRAVENOUS

## 2014-03-05 MED ORDER — ARTIFICIAL TEARS OP OINT
TOPICAL_OINTMENT | OPHTHALMIC | Status: DC | PRN
  Administered 2014-03-05: 1 via OPHTHALMIC

## 2014-03-05 MED ORDER — ACETAMINOPHEN 500 MG PO TABS
1000.0000 mg | ORAL_TABLET | Freq: Four times a day (QID) | ORAL | Status: DC
  Administered 2014-03-05 – 2014-03-07 (×9): 1000 mg via ORAL
  Filled 2014-03-05 (×14): qty 2

## 2014-03-05 MED ORDER — SODIUM CHLORIDE 0.9 % IJ SOLN
INTRAMUSCULAR | Status: AC
  Filled 2014-03-05: qty 10

## 2014-03-05 MED ORDER — LIDOCAINE HCL (CARDIAC) 20 MG/ML IV SOLN
INTRAVENOUS | Status: AC
  Filled 2014-03-05: qty 5

## 2014-03-05 MED ORDER — OXYCODONE HCL 5 MG PO TABS
5.0000 mg | ORAL_TABLET | ORAL | Status: DC | PRN
  Administered 2014-03-07: 5 mg via ORAL
  Administered 2014-03-07: 10 mg via ORAL
  Filled 2014-03-05: qty 2
  Filled 2014-03-05: qty 1

## 2014-03-05 MED ORDER — PROPOFOL 10 MG/ML IV BOLUS
INTRAVENOUS | Status: DC | PRN
  Administered 2014-03-05: 180 mg via INTRAVENOUS

## 2014-03-05 MED ORDER — NEOSTIGMINE METHYLSULFATE 10 MG/10ML IV SOLN
INTRAVENOUS | Status: AC
  Filled 2014-03-05: qty 1

## 2014-03-05 MED ORDER — PHENYLEPHRINE HCL 10 MG/ML IJ SOLN
10.0000 mg | INTRAVENOUS | Status: DC | PRN
  Administered 2014-03-05: 50 ug/min via INTRAVENOUS

## 2014-03-05 MED ORDER — OXYCODONE HCL 5 MG/5ML PO SOLN: 5.0000 mg | Freq: Once | ORAL | Status: DC | PRN

## 2014-03-05 MED ORDER — FENTANYL CITRATE 0.05 MG/ML IJ SOLN
INTRAMUSCULAR | Status: AC
  Administered 2014-03-05: 100 ug
  Filled 2014-03-05: qty 2

## 2014-03-05 MED ORDER — ALBUTEROL SULFATE (2.5 MG/3ML) 0.083% IN NEBU: 3.0000 mL | INHALATION_SOLUTION | Freq: Four times a day (QID) | RESPIRATORY_TRACT | Status: DC | PRN

## 2014-03-05 MED ORDER — ONDANSETRON HCL 4 MG/2ML IJ SOLN
INTRAMUSCULAR | Status: DC | PRN
  Administered 2014-03-05: 4 mg via INTRAVENOUS

## 2014-03-05 MED ORDER — HYDROMORPHONE HCL PF 1 MG/ML IJ SOLN
0.2500 mg | INTRAMUSCULAR | Status: DC | PRN
  Administered 2014-03-05 (×2): 0.5 mg via INTRAVENOUS

## 2014-03-05 MED ORDER — ACETAMINOPHEN 160 MG/5ML PO SOLN
1000.0000 mg | Freq: Four times a day (QID) | ORAL | Status: DC
  Filled 2014-03-05: qty 40

## 2014-03-05 MED ORDER — ARTIFICIAL TEARS OP OINT
TOPICAL_OINTMENT | OPHTHALMIC | Status: AC
  Filled 2014-03-05: qty 3.5

## 2014-03-05 MED ORDER — CHLORHEXIDINE GLUCONATE CLOTH 2 % EX PADS
6.0000 | MEDICATED_PAD | Freq: Every day | CUTANEOUS | Status: DC
  Administered 2014-03-05 – 2014-03-07 (×3): 6 via TOPICAL

## 2014-03-05 MED ORDER — DIPHENHYDRAMINE HCL 50 MG/ML IJ SOLN: 12.5000 mg | Freq: Four times a day (QID) | INTRAMUSCULAR | Status: DC | PRN

## 2014-03-05 MED ORDER — HYDROMORPHONE 0.3 MG/ML IV SOLN
INTRAVENOUS | Status: AC
  Filled 2014-03-05: qty 25

## 2014-03-05 MED ORDER — ONDANSETRON HCL 4 MG/2ML IJ SOLN
INTRAMUSCULAR | Status: AC
Start: 2014-03-05 — End: 2014-03-05
  Filled 2014-03-05: qty 2

## 2014-03-05 SURGICAL SUPPLY — 66 items
ADH SKN CLS APL DERMABOND .7 (GAUZE/BANDAGES/DRESSINGS) ×1
BAG SPEC RTRVL LRG 6X4 10 (ENDOMECHANICALS) ×1
CANISTER SUCTION 2500CC (MISCELLANEOUS) ×6 IMPLANT
CATH KIT ON Q 5IN SLV (PAIN MANAGEMENT) IMPLANT
CATH THORACIC 28FR (CATHETERS) IMPLANT
CATH THORACIC 36FR (CATHETERS) IMPLANT
CATH THORACIC 36FR RT ANG (CATHETERS) IMPLANT
CONT SPEC 4OZ CLIKSEAL STRL BL (MISCELLANEOUS) ×6 IMPLANT
COVER SURGICAL LIGHT HANDLE (MISCELLANEOUS) ×6 IMPLANT
DERMABOND ADVANCED (GAUZE/BANDAGES/DRESSINGS) ×2
DERMABOND ADVANCED .7 DNX12 (GAUZE/BANDAGES/DRESSINGS) IMPLANT
DRAPE CAMERA VIDEO/LASER (DRAPES) IMPLANT
DRAPE LAPAROSCOPIC ABDOMINAL (DRAPES) ×3 IMPLANT
DRAPE WARM FLUID 44X44 (DRAPE) ×3 IMPLANT
ELECT REM PT RETURN 9FT ADLT (ELECTROSURGICAL) ×3
ELECTRODE REM PT RTRN 9FT ADLT (ELECTROSURGICAL) ×1 IMPLANT
GLOVE EUDERMIC 7 POWDERFREE (GLOVE) ×6 IMPLANT
GOWN STRL REUS W/ TWL LRG LVL3 (GOWN DISPOSABLE) ×2 IMPLANT
GOWN STRL REUS W/ TWL XL LVL3 (GOWN DISPOSABLE) ×1 IMPLANT
GOWN STRL REUS W/TWL LRG LVL3 (GOWN DISPOSABLE) ×6
GOWN STRL REUS W/TWL XL LVL3 (GOWN DISPOSABLE) ×3
HANDLE STAPLE ENDO GIA SHORT (STAPLE) ×2
KIT BASIN OR (CUSTOM PROCEDURE TRAY) ×3 IMPLANT
KIT ROOM TURNOVER OR (KITS) ×3 IMPLANT
NS IRRIG 1000ML POUR BTL (IV SOLUTION) ×6 IMPLANT
PACK CHEST (CUSTOM PROCEDURE TRAY) ×3 IMPLANT
PAD ARMBOARD 7.5X6 YLW CONV (MISCELLANEOUS) ×6 IMPLANT
POUCH SPECIMEN RETRIEVAL 10MM (ENDOMECHANICALS) ×2 IMPLANT
RELOAD EGIA BLACK ROTIC 45MM (STAPLE) ×6 IMPLANT
RELOAD STAPLE 45 BLK XTHK (STAPLE) IMPLANT
RELOAD STAPLE 60 BLK XTHK ART (STAPLE) IMPLANT
RELOAD TRI 2.0 60 XTHK VAS SUL (STAPLE) ×6 IMPLANT
SEALANT SURG COSEAL 4ML (VASCULAR PRODUCTS) IMPLANT
SEALANT SURG COSEAL 8ML (VASCULAR PRODUCTS) IMPLANT
SOLUTION ANTI FOG 6CC (MISCELLANEOUS) IMPLANT
SPECIMEN JAR MEDIUM (MISCELLANEOUS) ×3 IMPLANT
SPONGE GAUZE 4X4 12PLY (GAUZE/BANDAGES/DRESSINGS) ×3 IMPLANT
SPONGE GAUZE 4X4 12PLY STER LF (GAUZE/BANDAGES/DRESSINGS) ×2 IMPLANT
STAPLER ENDO GIA 12 SHRT THIN (STAPLE) IMPLANT
STAPLER ENDO GIA 12MM SHORT (STAPLE) ×1 IMPLANT
SUT PROLENE 3 0 SH DA (SUTURE) IMPLANT
SUT PROLENE 4 0 RB 1 (SUTURE)
SUT PROLENE 4-0 RB1 .5 CRCL 36 (SUTURE) IMPLANT
SUT SILK  1 MH (SUTURE)
SUT SILK 1 MH (SUTURE) IMPLANT
SUT SILK 2 0SH CR/8 30 (SUTURE) IMPLANT
SUT VIC AB 1 CTX 36 (SUTURE)
SUT VIC AB 1 CTX36XBRD ANBCTR (SUTURE) IMPLANT
SUT VIC AB 2 TP1 27 (SUTURE) IMPLANT
SUT VIC AB 2-0 CT1 27 (SUTURE) ×3
SUT VIC AB 2-0 CT1 TAPERPNT 27 (SUTURE) IMPLANT
SUT VIC AB 2-0 CTX 36 (SUTURE) ×2 IMPLANT
SUT VIC AB 2-0 UR6 27 (SUTURE) ×4 IMPLANT
SUT VIC AB 3-0 MH 27 (SUTURE) IMPLANT
SUT VIC AB 3-0 SH 27 (SUTURE)
SUT VIC AB 3-0 SH 27X BRD (SUTURE) IMPLANT
SUT VIC AB 3-0 X1 27 (SUTURE) ×4 IMPLANT
SYSTEM SAHARA CHEST DRAIN ATS (WOUND CARE) ×3 IMPLANT
TAPE CLOTH SOFT 2X10 (GAUZE/BANDAGES/DRESSINGS) ×2 IMPLANT
TIP APPLICATOR SPRAY EXTEND 16 (VASCULAR PRODUCTS) IMPLANT
TOWEL OR 17X24 6PK STRL BLUE (TOWEL DISPOSABLE) ×3 IMPLANT
TOWEL OR 17X26 10 PK STRL BLUE (TOWEL DISPOSABLE) ×3 IMPLANT
TRAP SPECIMEN MUCOUS 40CC (MISCELLANEOUS) IMPLANT
TRAY FOLEY CATH 14FRSI W/METER (CATHETERS) ×3 IMPLANT
TROCAR XCEL NON-BLD 11X100MML (ENDOMECHANICALS) ×2 IMPLANT
WATER STERILE IRR 1000ML POUR (IV SOLUTION) ×6 IMPLANT

## 2014-03-05 NOTE — H&P (Signed)
[address]       [place] [address]             [phone]      Cardiothoracic Surgery History and Physical   PCP is Kandice Hams, MD  Referring Provider is Collene Gobble, MD   Chief Complaint   Patient presents with   .  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 She was admitted to [facility] in January 2015 with hypoxemia and shortness of breath that developed over a 3-4 week period. Her CT showed numerous bilateral pulmonary nodules that were semisolid with a confluent mass-like opacity in the left lower lung crossing the major fissure. It was negative for PE. Serologies and FOB were negative. She was discharged on oxygen and continued to have some dyspnea with exertion and desaturation on RA. She was given a trial of Prednisone 40 mg per day which did not improve her breathing but did make her gain 30 lbs. The steroids were associated with significant improvement in her pneumonitis. Her most recent CT from 02/06/2014 showed recurrent development of multiple pulmonary nodules scattered throughout the lungs bilaterally, the largest of which appears in the superior segment of the left lower lobe.    Past Medical History   Diagnosis  Date   .  Hypertension    .  Diabetes mellitus without complication    .  GERD (gastroesophageal reflux disease)    .  Arthritis    .  Diastolic dysfunction      followed by dr polite found on echo 06-12-13 , grade 1 per pt   .  Toxoplasmosis  Treated at age 54.     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  Dyslipidemia     Past Surgical History   Procedure  Laterality  Date   .  Cholecystectomy   1996   .  Wisdom tooth extraction   yrs ago   .  All teeth extraction   7-8 yrs ago   .  Colonoscopy with  propofol  N/A  08/05/2013     Procedure: COLONOSCOPY WITH PROPOFOL; Surgeon: Garlan Fair, MD; Location: [facility]; Service: Endoscopy; Laterality: N/A;   .  Video bronchoscopy  Bilateral  09/04/2013     Procedure: VIDEO BRONCHOSCOPY WITH FLUORO; Surgeon: Collene Gobble, MD; Location: [facility]; Service: Cardiopulmonary; Laterality: Bilateral;    Family History   Problem  Relation  Age of Onset   .  Heart failure  Mother    .  Diabetes  Father    .  Hypertension  Father    .  Diabetes  Brother    .  Hypertension  Brother    Social History  History   Substance Use Topics   .  Smoking status:  Former Smoker -- 0.50 packs/day for 25 years     Types:  Cigarettes     Quit date:  08/21/2012   .  Smokeless tobacco:  Never Used   .  Alcohol Use:  Yes      Comment: very rare    Current Outpatient Prescriptions   Medication  Sig  Dispense  Refill   .  albuterol (PROVENTIL  HFA;VENTOLIN HFA) 108 (90 BASE) MCG/ACT inhaler  Inhale 2 puffs into the lungs every 6 (six) hours as needed for wheezing or shortness of breath.  1 Inhaler  6   .  aspirin EC 81 MG tablet  Take 81 mg by mouth daily.     [name]  atorvastatin (LIPITOR) 10 MG tablet  Take 10 mg by mouth at bedtime.     .  Fluticasone Furoate-Vilanterol (BREO ELLIPTA) 100-25 MCG/INH AEPB  Inhale 1 puff into the lungs daily.  1 each  0   .  furosemide (LASIX) 40 MG tablet  Take 80 mg by mouth daily.     [name]  HYDROcodone-acetaminophen (NORCO/VICODIN) 5-325 MG per tablet  Take 1 tablet by mouth every 12 (twelve) hours.     .  insulin aspart (NOVOLOG) 100 UNIT/ML injection  Inject 20 Units into the skin 3 (three) times daily before meals. Sliding scale     .  insulin glargine (LANTUS) 100 UNIT/ML injection  Inject 350 Units into the skin at bedtime.     [name]  lisinopril (PRINIVIL,ZESTRIL) 10 MG tablet  Take 1 tablet (10 mg total) by mouth daily at 6 PM.  30 tablet  0   .  Multiple Vitamins-Minerals (CENTRUM ADULTS) TABS  Take 1 tablet by mouth daily.      .  potassium chloride (K-DUR,KLOR-CON) 10 MEQ tablet  Take 1 tablet (10 mEq total) by mouth daily.  30 tablet  0   .  sitaGLIPtin (JANUVIA) 100 MG tablet  Take 100 mg by mouth at bedtime.      No current facility-administered medications for this visit.    Allergies   Allergen  Reactions   .  Metformin And Related  Nausea And Vomiting   .  Sulfa Antibiotics  Rash    Review of Systems   Constitutional: Positive for unexpected weight change.  Weight gain on prednisone  HENT:  Wears dentures  Eyes: Positive for visual disturbance.  Respiratory: Positive for shortness of breath. Negative for cough.  Cardiovascular: Positive for leg swelling. Negative for chest pain.  Gastrointestinal: Negative.  Genitourinary: Negative.  Musculoskeletal: Positive for arthralgias.  Allergic/Immunologic: Negative.  Neurological:  Neuropathy  Hematological: Negative.  Psychiatric/Behavioral: Negative   BP 149/71  Pulse 71  Resp 16  Ht 5\' 3"  (1.6 m)  Wt 237 lb (107.502 kg)  BMI 41.99 kg/m2  SpO2 94%  LMP 08/28/2008   Physical Exam   Constitutional: She is oriented to person, place, and time.  Obese WF in no distress on nasal oxygen  HENT:  Head: Normocephalic and atraumatic.  Mouth/Throat: Oropharynx is clear and moist.  Eyes: EOM are normal.  Neck: Normal range of motion. No JVD present. No tracheal deviation present. No thyromegaly present.  Cardiovascular: Normal rate, regular rhythm, normal heart sounds and intact distal pulses.  No murmur heard.  Pulmonary/Chest: Effort normal and breath sounds normal. No respiratory distress. She has no wheezes. She has no rales. She exhibits no tenderness.  Abdominal: Soft. Bowel sounds are normal. She exhibits no distension. There is no tenderness.  Musculoskeletal: Normal range of motion. She exhibits edema.  Lymphadenopathy:  She has no cervical adenopathy.  Neurological: She is alert and oriented to person, place, and time. She has normal  strength. No cranial nerve deficit.  Skin: Skin is warm and dry.  Psychiatric: She has a normal mood and affect.    Diagnostic Tests:   CLINICAL DATA: Shortness breath.  EXAM:  CT CHEST WITHOUT CONTRAST  TECHNIQUE:  Multidetector CT imaging of the chest was performed following the  standard protocol without intravenous contrast. High resolution  imaging of the lungs, as well as inspiratory and expiratory imaging,  was performed.  COMPARISON: Chest CT 10/27/2013.  FINDINGS:  Mediastinum: Heart size is normal. Small amount of pericardial fluid  and/or thickening, unlikely to be of hemodynamic significance at  this time. No associated pericardial calcification. Multiple  borderline enlarged and mildly enlarged mediastinal and hilar lymph  nodes are noted, largest of which are in the prevascular station  measuring 11 mm (image 16 of series 2), low right paratracheal  station measuring 11 mm (image 20 of series 2), and subcarinal  station measuring 11 mm (image 24 of series 2). Esophagus is  unremarkable in appearance.  Lungs/Pleura: Multiple aggressive appearing nodules are seen  scattered throughout the lungs bilaterally, largest of which is in  the superior segment of the left lower lobe measuring 2.2 x 1.4 cm.  This nodule has macrolobulated and slightly spiculated margins with  retraction of the superior aspect of the right major fissure. The  largest nodule in the right lung is also in the superior segment of  the right lower lobe measuring up to 11 mm. Several of the smaller  nodules are unchanged in size compared to the prior examination and  are likely unrelated to the new nodules which are presumably an  acute infectious process, as neoplasm would be unlikely to develop  in a 4 month time interval, and would be unlikely to have a slight  upper lung predominance as is seen in this entity. No pleural  effusions. Linear scarring in the upper lobes of the lungs  bilaterally  and in the inferior segment of the lingula and lateral  segment of the right middle lobe, similar to the prior study. Mosaic  attenuation is again noted throughout the lungs bilaterally, again  suggestive of air trapping from small airways disease.  High-resolution images demonstrate no areas of significant  subpleural reticulation, traction bronchiectasis or frank  honeycombing to suggest an underlying interstitial lung disease.  Upper Abdomen: Status post cholecystectomy.  Musculoskeletal: There are no aggressive appearing lytic or blastic  lesions noted in the visualized portions of the skeleton.  IMPRESSION:  1. Recurrent development of multiple aggressive appearing pulmonary  nodules scattered throughout the lungs bilaterally, with slight mid  to upper lung predominance, most compatible with an atypical  infectious process.  2. Reactive mediastinal and hilar adenopathy, as above.  3. No evidence of underlying interstitial lung disease at this time.  4. Air trapping redemonstrated, indicative of small airways disease.  5. Additional incidental findings, as above.  Electronically Signed  By: Vinnie Langton M.D.  On: 02/06/2014 17:31    Impression:   She has bilateral pulmonary nodules, dyspnea and hypoxemia. The nodules markedly improved with steroids but her shortness of breath and hypoxemia did not. The nodules have recurred off steroids and it is felt that an open lung biopsy is needed to aid in diagnosis. The superior segment of the left lower lobe appears to be the most involved with the largest nodule there. I think a left VATS approach will probably allow a wedge resection of the superior segment. I discussed the procedure with her and her husband including alternatives, benefits and risks including but not limited to bleeding, infection, injury to the lung, prolonged air leak, and the inability to make a firm diagnosis even after wedge resection lung biopsy. She understands and  agrees  to proceed.    Plan:   She will be scheduled for left VATS for wedge resection biopsy of the superior segment of the left lower lobe on 03/05/2014.

## 2014-03-05 NOTE — Transfer of Care (Signed)
Immediate Anesthesia Transfer of Care Note  Patient: Marissa Wilkerson  Procedure(s) Performed: Procedure(s): VIDEO ASSISTED THORACOSCOPY (Left) LUNG BIOPSY (Left)  Patient Location: PACU  Anesthesia Type:General  Level of Consciousness: awake and oriented  Airway & Oxygen Therapy: Patient Spontanous Breathing and Patient connected to face mask oxygen  Post-op Assessment: Report given to PACU RN  Post vital signs: Reviewed and stable  Complications: No apparent anesthesia complications

## 2014-03-05 NOTE — Anesthesia Preprocedure Evaluation (Addendum)
Anesthesia Evaluation  Patient identified by MRN, date of birth, ID band Patient awake    Reviewed: Allergy & Precautions, H&P , NPO status , Patient's Chart, lab work & pertinent test results  Airway Mallampati: II  Neck ROM: full    Dental  (+) Edentulous Upper, Edentulous Lower, Dental Advisory Given   Pulmonary shortness of breath, COPDformer smoker,          Cardiovascular hypertension, Rhythm:Regular Rate:Normal     Neuro/Psych    GI/Hepatic GERD-  ,  Endo/Other  diabetes, Type 2Morbid obesity  Renal/GU      Musculoskeletal  (+) Arthritis -,   Abdominal   Peds  Hematology   Anesthesia Other Findings   Reproductive/Obstetrics                          Anesthesia Physical Anesthesia Plan  ASA: III  Anesthesia Plan: General   Post-op Pain Management:    Induction: Intravenous  Airway Management Planned: Double Lumen EBT  Additional Equipment: Arterial line and CVP  Intra-op Plan:   Post-operative Plan: Extubation in OR  Informed Consent: I have reviewed the patients History and Physical, chart, labs and discussed the procedure including the risks, benefits and alternatives for the proposed anesthesia with the patient or authorized representative who has indicated his/her understanding and acceptance.     Plan Discussed with: CRNA, Anesthesiologist and Surgeon  Anesthesia Plan Comments:         Anesthesia Quick Evaluation

## 2014-03-05 NOTE — Interval H&P Note (Signed)
History and Physical Interval Note:  03/05/2014 12:33 PM  Marissa Wilkerson  has presented today for surgery, with the diagnosis of lung nodules  The various methods of treatment have been discussed with the patient and family. After consideration of risks, benefits and other options for treatment, the patient has consented to  Procedure(s): VIDEO ASSISTED THORACOSCOPY (Left) LUNG BIOPSY (Left) as a surgical intervention .  The patient's history has been reviewed, patient examined, no change in status, stable for surgery.  I have reviewed the patient's chart and labs.  Questions were answered to the patient's satisfaction.     Gaye Pollack

## 2014-03-05 NOTE — Progress Notes (Signed)
Patient ID: Marissa Wilkerson, female   DOB: 1960-10-23, 53 y.o.   MRN: [id number]  SICU Evening Rounds:  Hemodynamically stable  Pain under good control Awake and alert  CT output low, no air leak  Postop labs ok

## 2014-03-05 NOTE — Anesthesia Postprocedure Evaluation (Signed)
Anesthesia Post Note  Patient: Marissa Wilkerson  Procedure(s) Performed: Procedure(s) (LRB): VIDEO ASSISTED THORACOSCOPY (Left) LUNG BIOPSY (Left)  Anesthesia type: General  Patient location: PACU  Post pain: Pain level controlled  Post assessment: Patient's Cardiovascular Status Stable  Last Vitals:  Filed Vitals:   03/05/14 1615  BP:   Pulse: 72  Temp:   Resp: 16    Post vital signs: Reviewed and stable  Level of consciousness: alert  Complications: No apparent anesthesia complications

## 2014-03-05 NOTE — Anesthesia Procedure Notes (Signed)
Procedure Name: Intubation Date/Time: 03/05/2014 1:15 PM Performed by: Sampson Si E Pre-anesthesia Checklist: Patient identified, Emergency Drugs available, Suction available, Patient being monitored and Timeout performed Patient Re-evaluated:Patient Re-evaluated prior to inductionOxygen Delivery Method: Circle system utilized Preoxygenation: Pre-oxygenation with 100% oxygen Intubation Type: IV induction Ventilation: Mask ventilation without difficulty Laryngoscope Size: Mac and 3 Grade View: Grade II Tube type: Oral Endobronchial tube: Left, Double lumen EBT and EBT position confirmed by fiberoptic bronchoscope and 37 Fr Number of attempts: 2 Airway Equipment and Method: Stylet Placement Confirmation: ETT inserted through vocal cords under direct vision,  positive ETCO2 and breath sounds checked- equal and bilateral Secured at: 29 cm Tube secured with: Tape Dental Injury: Teeth and Oropharynx as per pre-operative assessment  Comments: First attempt x 1 by CRNA, Grade II view, entered esophagus. Second attempt x 1 by Dr. Marcie Bal, Grade II view with cricoid pressure held.

## 2014-03-05 NOTE — Brief Op Note (Signed)
03/05/2014  2:53 PM  PATIENT:  Marissa Wilkerson  53 y.o. female  PRE-OPERATIVE DIAGNOSIS:  lung nodules  POST-OPERATIVE DIAGNOSIS:  lung nodules  PROCEDURE:  Procedure(s): VIDEO ASSISTED THORACOSCOPY (Left) LUNG BIOPSY (Left) Superior segment of left lower lobe.  SURGEON:  Surgeon(s) and Role:    * Gaye Pollack, MD - Primary  PHYSICIAN ASSISTANT: Lars Pinks, PA-C   ANESTHESIA:   general  EBL:  Total I/O In: 1000 [I.V.:1000] Out: 200 [Urine:200]  BLOOD ADMINISTERED:none  DRAINS: 1 27 F Chest Tube(s) in the left pleural space   LOCAL MEDICATIONS USED:  NONE  SPECIMEN:  Source of Specimen:  wedge resection of superior segment of left lower lobe  DISPOSITION OF SPECIMEN:  PATHOLOGY and micro  COUNTS:  YES  TOURNIQUET:  * No tourniquets in log *  DICTATION: .Note written in EPIC  PLAN OF CARE: Admit to inpatient   PATIENT DISPOSITION:  PACU - hemodynamically stable.   Delay start of Pharmacological VTE agent (>24hrs) due to surgical blood loss or risk of bleeding: yes

## 2014-03-06 ENCOUNTER — Encounter (HOSPITAL_COMMUNITY): Payer: Self-pay | Admitting: Surgery

## 2014-03-06 ENCOUNTER — Inpatient Hospital Stay (HOSPITAL_COMMUNITY): Payer: 59

## 2014-03-06 LAB — GLUCOSE, CAPILLARY
GLUCOSE-CAPILLARY: 207 mg/dL — AB (ref 70–99)
Glucose-Capillary: 134 mg/dL — ABNORMAL HIGH (ref 70–99)
Glucose-Capillary: 143 mg/dL — ABNORMAL HIGH (ref 70–99)
Glucose-Capillary: 200 mg/dL — ABNORMAL HIGH (ref 70–99)
Glucose-Capillary: 232 mg/dL — ABNORMAL HIGH (ref 70–99)

## 2014-03-06 LAB — CBC
HCT: 29 % — ABNORMAL LOW (ref 36.0–46.0)
HEMOGLOBIN: 9.3 g/dL — AB (ref 12.0–15.0)
MCH: 29.3 pg (ref 26.0–34.0)
MCHC: 32.1 g/dL (ref 30.0–36.0)
MCV: 91.5 fL (ref 78.0–100.0)
Platelets: 251 10*3/uL (ref 150–400)
RBC: 3.17 MIL/uL — AB (ref 3.87–5.11)
RDW: 16 % — ABNORMAL HIGH (ref 11.5–15.5)
WBC: 9 10*3/uL (ref 4.0–10.5)

## 2014-03-06 LAB — BASIC METABOLIC PANEL
Anion gap: 12 (ref 5–15)
BUN: 16 mg/dL (ref 6–23)
CO2: 26 meq/L (ref 19–32)
Calcium: 8.7 mg/dL (ref 8.4–10.5)
Chloride: 99 mEq/L (ref 96–112)
Creatinine, Ser: 0.6 mg/dL (ref 0.50–1.10)
GFR calc Af Amer: >90 mL/min (ref >90)
Glucose, Bld: 139 mg/dL — ABNORMAL HIGH (ref 70–99)
POTASSIUM: 4.6 meq/L (ref 3.7–5.3)
SODIUM: 137 meq/L (ref 137–147)

## 2014-03-06 LAB — POCT I-STAT 3, ART BLOOD GAS (G3+)
ACID-BASE EXCESS: 3 mmol/L — AB (ref 0.0–2.0)
Bicarbonate: 28.3 mEq/L — ABNORMAL HIGH (ref 20.0–24.0)
O2 SAT: 93 %
PO2 ART: 70 mmHg — AB (ref 80.0–100.0)
TCO2: 30 mmol/L (ref 0–100)
pCO2 arterial: 46.1 mmHg — ABNORMAL HIGH (ref 35.0–45.0)
pH, Arterial: 7.4 (ref 7.350–7.450)

## 2014-03-06 MED ORDER — LEVALBUTEROL HCL 0.63 MG/3ML IN NEBU: 0.6300 mg | INHALATION_SOLUTION | Freq: Four times a day (QID) | RESPIRATORY_TRACT | Status: DC | PRN

## 2014-03-06 NOTE — Op Note (Signed)
CARDIOTHORACIC SURGERY OPERATIVE NOTE:  Marissa Wilkerson [id number] 03/05/2014   Preoperative Dx:  Bilateral pulmonary nodules  Postoperative Dx: same   Procedure: Left video-assisted thoracoscopy, wedge resection of superior segment of the left lower lobe  Surgeon: Dr. 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 She was admitted to [facility] in January 2015 with hypoxemia and shortness of breath that developed over a 3-4 week period. Her CT showed numerous bilateral pulmonary nodules that were semisolid with a confluent mass-like opacity in the left lower lung crossing the major fissure. It was negative for PE. Serologies and FOB were negative. She was discharged on oxygen and continued to have some dyspnea with exertion and desaturation on RA. She was given a trial of Prednisone 40 mg per day which did not improve her breathing but did make her gain 30 lbs. The steroids were associated with significant improvement in her pneumonitis. Her most recent CT from 02/06/2014 showed recurrent development of multiple pulmonary nodules scattered throughout the lungs bilaterally, the largest of which appears in the superior segment of the left lower lobe. She has bilateral pulmonary nodules, dyspnea and hypoxemia. The nodules markedly improved with steroids but her shortness of breath and hypoxemia did not. The nodules have recurred off steroids and it is felt that an open lung biopsy is needed to aid in diagnosis. The superior segment of the left lower lobe appears to be the most involved with the largest nodule there. I think a left VATS approach will probably allow a wedge resection of the superior segment. I discussed the procedure with her and her  husband including alternatives, benefits and risks including but not limited to bleeding, infection, injury to the lung, prolonged air leak, and the inability to make a firm diagnosis even after wedge resection lung biopsy. She understands and agrees to proceed.       Operative procedure:   The patient was seen in the preoperative holding area. The proper patient, proper operative side, proper operation were confirmed after reviewing his history and chest x-ray. The left side of the chest was signed by me. Preoperative intravenous antibiotics were given. He was taken back to the operating room and placed on table in the supine position. After induction of general endotracheal anesthesia using a double-lumen tube, a Foley catheter was placed in the bladder using sterile technique. Lower extremity sequential compression devices were used. The patient was positioned in the right lateral decubitus position with the left side up. The left side of the chest was prepped with Betadine soap and solution and draped in the usual sterile manner. A timeout was taken and the proper patient, proper operation, and proper operative side were confirmed with nursing and anesthesia staff. A 1 cm incision was made in the midaxillary line at about the eighth intercostal space. The left pleural space was entered bluntly with a hemostat and an 8 mm trocar was inserted. The 0 thoracoscope was inserted and the pleural space inspected. There were a few adhesions present between the lung and the chest wall and these were divided using cautery scissors. There were no lesions visible on the visceral or parietal pleural surface.  A second 1 cm incision was made in the posterior axillary  line at the fifth intercostal space for insertion of instruments. Due to the thickness of the chest wall here a disposable 11 mm port was used.  A third 1 cm incision was made in the anterior axillary line at the 6th ICS for instruments. The scope was  moved to the posterior trocar site for better visualization of the superior segment of the lower lobe. This segment of the lung had palpable nodularity and was firm and non-compressible. A wedge resection of the superior segment was performed using several firings of a 60 mm black stapler. The specimen was placed in an endobag and removed. It was sent to pathology and microbiology. Hemostasis was complete. A 28 F chest tube was inserted through the mid-axillary incision. The other two incisions were closed using 2-0 vicryl suture for the subcutaneous tissue and 3-0 vicryl subcuticular skin closure. The chest tube was connected to Pleur-evac suction. The sponge needle and instrument counts were correct according to the scrub nurse. The patient was then turned into the supine position, extubated, and transported to the post anesthesia care unit in satisfactory and stable condition.

## 2014-03-06 NOTE — Plan of Care (Signed)
Problem: Phase II Progression Outcomes Goal: Tolerates progressive ambulation Outcome: Progressing Ambulating approx 100 feet on 5-6 L/min Goal: Tolerating diet Outcome: Progressing Eating small amoiunts carb modified medium

## 2014-03-06 NOTE — Progress Notes (Signed)
1 Day Post-Op Procedure(s) (LRB): VIDEO ASSISTED THORACOSCOPY (Left) LUNG BIOPSY (Left) Subjective: No complaints  Pain under good control  Objective: Vital signs in last 24 hours: Temp:  [97.2 F (36.2 C)-100.2 F (37.9 C)] 99.8 F (37.7 C) (07/10 0734) Pulse Rate:  [71-94] 92 (07/10 0700) Cardiac Rhythm:  [-] Normal sinus rhythm (07/10 0400) Resp:  [10-23] 11 (07/10 0700) BP: (106-158)/(42-65) 152/53 mmHg (07/10 0700) SpO2:  [88 %-100 %] 91 % (07/10 0700) Arterial Line BP: (102-171)/(39-68) 137/53 mmHg (07/10 0700) Weight:  [115.6 kg (254 lb 13.6 oz)] 115.6 kg (254 lb 13.6 oz) (07/10 0600)  Hemodynamic parameters for last 24 hours:    Intake/Output from previous day: 07/09 0701 - 07/10 0700 In: 3135 [P.O.:180; I.V.:2855; IV Piggyback:100] Out: 1355 [Urine:1185; Chest Tube:170] Intake/Output this shift:    General appearance: alert and cooperative Heart: regular rate and rhythm, S1, S2 normal, no murmur, click, rub or gallop Lungs: clear to auscultation bilaterally chest tube with minimal drainage and no air leak  Lab Results:  Recent Labs  03/03/14 0942 03/06/14 0345  WBC 6.2 9.0  HGB 10.9* 9.3*  HCT 34.2* 29.0*  PLT 230 251   BMET:  Recent Labs  03/03/14 0942 03/06/14 0345  NA 138 137  K 4.2 4.6  CL 99 99  CO2 23 26  GLUCOSE 183* 139*  BUN 15 16  CREATININE 0.64 0.60  CALCIUM 9.4 8.7    PT/INR:  Recent Labs  03/03/14 0942  LABPROT 14.3  INR 1.11   ABG    Component Value Date/Time   PHART 7.400 03/06/2014 0358   HCO3 28.3* 03/06/2014 0358   TCO2 30 03/06/2014 0358   O2SAT 93.0 03/06/2014 0358   CBG (last 3)   Recent Labs  03/05/14 1957 03/05/14 2341 03/06/14 0346  GLUCAP 262* 123* 134*   CXR: clear. No ptx or effusion  Assessment/Plan: S/P Procedure(s) (LRB): VIDEO ASSISTED THORACOSCOPY (Left) LUNG BIOPSY (Left) She is doing well. Remove arterial line and foley Decrease IVF Chest tube to water seal IS Mobilize Diabetes  control See progression orders   LOS: 1 day    Marissa Wilkerson K 03/06/2014

## 2014-03-06 NOTE — Progress Notes (Signed)
CT surgery p.m. Rounds  Patient examined and record reviewed.Hemodynamics stable,labs satisfactory.Patient had stable day.Continue current care. VAN TRIGT III,PETER 03/06/2014   

## 2014-03-06 NOTE — Care Management Note (Signed)
    Page 1 of 1   03/06/2014     8:31:43 AM CARE MANAGEMENT NOTE 03/06/2014  Patient:  NALEIA, GARDE   Account Number:  [id number]  Date Initiated:  03/06/2014  Documentation initiated by:  Elissa Hefty  Subjective/Objective Assessment:   adm w vats     Action/Plan:   lives w husband, pcp dr Jori Moll polite   Anticipated DC Date:     Anticipated DC Plan:           Choice offered to / List presented to:             Status of service:   Medicare Important Message given?   (If response is "NO", the following Medicare IM given date fields will be blank) Date Medicare IM given:   Medicare IM given by:   Date Additional Medicare IM given:   Additional Medicare IM given by:    Discharge Disposition:    Per UR Regulation:  Reviewed for med. necessity/level of care/duration of stay  If discussed at [place] of Stay Meetings, dates discussed:    Comments:

## 2014-03-07 ENCOUNTER — Inpatient Hospital Stay (HOSPITAL_COMMUNITY): Payer: 59

## 2014-03-07 LAB — GLUCOSE, CAPILLARY
GLUCOSE-CAPILLARY: 194 mg/dL — AB (ref 70–99)
Glucose-Capillary: 179 mg/dL — ABNORMAL HIGH (ref 70–99)
Glucose-Capillary: 226 mg/dL — ABNORMAL HIGH (ref 70–99)
Glucose-Capillary: 212 mg/dL — ABNORMAL HIGH (ref 70–99)
Glucose-Capillary: 219 mg/dL — ABNORMAL HIGH (ref 70–99)
Glucose-Capillary: 222 mg/dL — ABNORMAL HIGH (ref 70–99)

## 2014-03-07 MED ORDER — INSULIN GLARGINE 100 UNIT/ML ~~LOC~~ SOLN
30.0000 [IU] | Freq: Two times a day (BID) | SUBCUTANEOUS | Status: DC
  Administered 2014-03-07 (×2): 30 [IU] via SUBCUTANEOUS
  Filled 2014-03-07 (×4): qty 0.3

## 2014-03-07 MED ORDER — INSULIN ASPART 100 UNIT/ML ~~LOC~~ SOLN
0.0000 [IU] | Freq: Three times a day (TID) | SUBCUTANEOUS | Status: DC
Start: 2014-03-07 — End: 2014-03-08
  Administered 2014-03-07 (×2): 7 [IU] via SUBCUTANEOUS

## 2014-03-07 MED ORDER — INSULIN ASPART 100 UNIT/ML ~~LOC~~ SOLN
0.0000 [IU] | Freq: Every day | SUBCUTANEOUS | Status: DC
Start: 2014-03-07 — End: 2014-03-08
  Administered 2014-03-07: 2 [IU] via SUBCUTANEOUS

## 2014-03-07 NOTE — Progress Notes (Signed)
2 Days Post-Op Procedure(s) (LRB): VIDEO ASSISTED THORACOSCOPY (Left) LUNG BIOPSY (Left) Subjective: Recovering well after left VATS and wedge section of pulmonary nodules Chest tube is removed today Pathology is still pending Postop chest x-ray is clear Postop pain is well tolerated and PCA is discontinued  Objective: Vital signs in last 24 hours: Temp:  [98 F (36.7 C)-98.5 F (36.9 C)] 98.1 F (36.7 C) (07/11 1353) Pulse Rate:  [73-91] 77 (07/11 1353) Cardiac Rhythm:  [-] Normal sinus rhythm (07/11 1414) Resp:  [13-27] 20 (07/11 1353) BP: (118-149)/(51-79) 132/56 mmHg (07/11 1353) SpO2:  [90 %-100 %] 97 % (07/11 1353)  Hemodynamic parameters for last 24 hours:   Stable Intake/Output from previous day: 07/10 0701 - 07/11 0700 In: 1885 [P.O.:630; I.V.:1255] Out: 2031 [Urine:1750; Stool:1; Chest Tube:280] Intake/Output this shift: Total I/O In: 480 [P.O.:180; I.V.:300] Out: 180 [Urine:100; Chest Tube:80]  Breath sounds clear Incision clean and dry  Lab Results:  Recent Labs  03/06/14 0345  WBC 9.0  HGB 9.3*  HCT 29.0*  PLT 251   BMET:  Recent Labs  03/06/14 0345  NA 137  K 4.6  CL 99  CO2 26  GLUCOSE 139*  BUN 16  CREATININE 0.60  CALCIUM 8.7    PT/INR: No results found for this basename: LABPROT, INR,  in the last 72 hours ABG    Component Value Date/Time   PHART 7.400 03/06/2014 0358   HCO3 28.3* 03/06/2014 0358   TCO2 30 03/06/2014 0358   O2SAT 93.0 03/06/2014 0358   CBG (last 3)   Recent Labs  03/06/14 2332 03/07/14 0401 03/07/14 1128  GLUCAP 194* 179* 212*    Assessment/Plan: S/P Procedure(s) (LRB): VIDEO ASSISTED THORACOSCOPY (Left) LUNG BIOPSY (Left) Plan for transfer to step-down: see transfer orders   LOS: 2 days    VAN TRIGT III,Taiz Bickle 03/07/2014

## 2014-03-08 ENCOUNTER — Inpatient Hospital Stay (HOSPITAL_COMMUNITY): Payer: 59

## 2014-03-08 LAB — BASIC METABOLIC PANEL
Anion gap: 13 (ref 5–15)
BUN: 9 mg/dL (ref 6–23)
CO2: 26 mEq/L (ref 19–32)
Calcium: 8.9 mg/dL (ref 8.4–10.5)
Chloride: 97 mEq/L (ref 96–112)
Creatinine, Ser: 0.49 mg/dL — ABNORMAL LOW (ref 0.50–1.10)
GFR calc Af Amer: >90 mL/min (ref >90)
GFR calc non Af Amer: >90 mL/min (ref >90)
Glucose, Bld: 182 mg/dL — ABNORMAL HIGH (ref 70–99)
Potassium: 4.1 mEq/L (ref 3.7–5.3)
Sodium: 136 mEq/L — ABNORMAL LOW (ref 137–147)

## 2014-03-08 LAB — CBC
HCT: 28.5 % — ABNORMAL LOW (ref 36.0–46.0)
Hemoglobin: 9.2 g/dL — ABNORMAL LOW (ref 12.0–15.0)
MCH: 29.4 pg (ref 26.0–34.0)
MCHC: 32.3 g/dL (ref 30.0–36.0)
MCV: 91.1 fL (ref 78.0–100.0)
Platelets: 265 10*3/uL (ref 150–400)
RBC: 3.13 MIL/uL — ABNORMAL LOW (ref 3.87–5.11)
RDW: 15.5 % (ref 11.5–15.5)
WBC: 7.7 10*3/uL (ref 4.0–10.5)

## 2014-03-08 LAB — GLUCOSE, CAPILLARY: GLUCOSE-CAPILLARY: 184 mg/dL — AB (ref 70–99)

## 2014-03-08 MED ORDER — TRAMADOL HCL 50 MG PO TABS: 50.0000 mg | ORAL_TABLET | Freq: Four times a day (QID) | ORAL | Status: DC | PRN

## 2014-03-08 NOTE — Discharge Instructions (Signed)
Video-Assisted Thoracic Surgery °Care After °Refer to this sheet in the next few weeks. These instructions provide you with information on caring for yourself after your procedure. Your caregiver may also give you more specific instructions. Your procedure has been planned according to current medical practices, but problems sometimes occur. Call your caregiver if you have any problems or questions after your procedure. °HOME CARE INSTRUCTIONS  °· Only take over-the-counter or prescription medications as directed. °· Only take pain medications (narcotics) as directed. °· Do not drive until your caregiver approves. Driving while taking narcotics or soon after surgery can be dangerous, so discuss the specific timing with your caregiver. °· Avoid activities that use your chest muscles, such as lifting heavy objects, for at least 3-4 weeks.   °· Take deep breaths to expand the lungs and to protect against pneumonia. °· Do breathing exercises as directed by your caregiver. If you were given an incentive spirometer to help with breathing, use it as directed. °· You may resume a normal diet and activities when you feel you are able to or as directed. °· Do not take a bath until your caregiver says it is OK. Use the shower instead.   °· Keep the bandage (dressing) covering the area where the chest tube was inserted (incision site) dry for 48 hours. After 48 hours, remove the dressing unless there is new drainage. °· Remove dressings as directed by your caregiver. °· Change dressings if necessary or as directed. °· Keep all follow-up appointments. It is important for you to see your caregiver after surgery to discuss appropriate follow-up care and surveillance, if it is necessary. °SEEK MEDICAL CARE: °· You feel excessive or increasing pain at an incision site. °· You notice bleeding, skin irritation, drainage, swelling, or redness at an incision site. °· There is a bad smell coming from an incision or dressing. °· It feels  like your heart is fluttering or beating rapidly. °· Your pain medication does not relieve your pain. °SEEK IMMEDIATE MEDICAL CARE IF:  °· You have a fever.   °· You have chest pain.  °· You have a rash. °· You have shortness of breath. °· You have trouble breathing.   °· You feel weak, lightheaded, dizzy, or faint.   °MAKE SURE YOU:  °· Understand these instructions.   °· Will watch your condition.   °· Will get help right away if you are not doing well or get worse. °Document Released: 12/09/2012 Document Reviewed: 12/09/2012 °ExitCare® Patient Information ©2015 [employer]. This information is not intended to replace advice given to you by your health care provider. Make sure you discuss any questions you have with your health care provider. ° °

## 2014-03-08 NOTE — Discharge Summary (Signed)
Physician Discharge Summary  Patient ID: Marissa Wilkerson MRN: [id number] DOB/AGE: 1961-03-27 53 y.o.  Admit date: 03/05/2014 Discharge date: 03/08/2014  Admission Diagnoses:  Patient Active Problem List   Diagnosis Date Noted  . Multiple lung nodules 03/05/2014  . Obstructive lung disease 02/09/2014  . Acute respiratory failure with hypoxia 09/02/2013  . Pulmonary nodules/lesions, multiple 09/02/2013  . Hypertension   . Diabetes mellitus without complication   . Diastolic dysfunction    Discharge Diagnoses:   Patient Active Problem List   Diagnosis Date Noted  . Multiple lung nodules 03/05/2014  . Obstructive lung disease 02/09/2014  . Acute respiratory failure with hypoxia 09/02/2013  . Pulmonary nodules/lesions, multiple 09/02/2013  . Hypertension   . Diabetes mellitus without complication   . Diastolic dysfunction    Discharged Condition: good  History of Present Illness:   Marissa Wilkerson is a 53 yo obese female with known history of diabetes, hypertension, and nicotine abuse.  She also has a history a history of Reiter's Syndrome and Toxoplasmosis.  In January of 2015 the patient was admitted to [facility] with hypoxemia and shortness of breath over a 3-4 week period.  CT scan was obtained and showed numerous bilateral pulmonary nodules.  These were semisolid with a confluent mass like opacity in the left lower lung crossing the major fissure.  She was ruled out for Pulmonary Embolism.  She was later discharged home on oxygen, with continued dyspnea.  She was later treated with steroids which did not help her symptoms.  She was followed by Dr. Lamonte Sakai who performed repeat CT scan 02/06/2014 which showed recurrent development of multiple pulmonary nodules scattered pulmonary nodules, with largest in the superior segment of the left lower lobe.  She was subsequently referred to [facility] for surgical evaluation for possible VATS for diagnostic purposes.  She was evaluated by Dr. Cyndia Bent  on 02/20/2014 at which time he felt VATS with wedge resection was a reasonable treatment option.  The risks and benefits of the procedure were explained to the patient and she was agreeable to proceed.  Hospital Course:   The patient presented to [facility] on [facility]/2014.  She was taken to the operating room and underwent Left Video Assisted Thoracoscopy with Wedge Resection of the Superior Segment of the Left Lower Lobe.  The patient tolerated the procedure well, was extubated and taken to the SICU in stable condition. The patient has done well since surgery.  Her chest tube has been free from air leak.  Her CXR has been stable and has not exhibited evidence of pneumothorax.  Therefore, the chest tubes were transitioned off suction and later discontinued without difficulty.  Her diabetes have been well controlled.  She has been weaned off her PCA and her pain is well controlled with oral medication.  She is ambulating without difficulty.  She is tolerating a carb modified diet.  She is felt medically stable for discharge today 03/08/2014.  Final Pathology has not yet been reported.   Consults: None  Significant Diagnostic Studies: radiology:   CT scan:   1. Recurrent development of multiple aggressive appearing pulmonary  nodules scattered throughout the lungs bilaterally, with slight mid  to upper lung predominance, most compatible with an atypical  infectious process.  2. Reactive mediastinal and hilar adenopathy, as above.  3. No evidence of underlying interstitial lung disease at this time.  4. Air trapping re demonstrated, indicative of small airways disease.  5. Additional incidental findings, as above.  Treatments: surgery:   Left video-assisted thoracoscopy, wedge resection of superior segment of the left lower lobe  Disposition: 01-Home or Self Care  Discharge Medications:      Medication List         albuterol 108 (90 BASE) MCG/ACT inhaler  Commonly known as:   PROVENTIL HFA;VENTOLIN HFA  Inhale 2 puffs into the lungs every 6 (six) hours as needed for wheezing or shortness of breath.     aspirin EC 81 MG tablet  Take 81 mg by mouth daily.     atorvastatin 10 MG tablet  Commonly known as:  LIPITOR  Take 10 mg by mouth daily.     CENTRUM ADULTS Tabs  Take 1 tablet by mouth daily.     diphenhydrAMINE 25 mg capsule  Commonly known as:  BENADRYL  Take 25-50 mg by mouth at bedtime as needed for sleep.     furosemide 40 MG tablet  Commonly known as:  LASIX  Take 80 mg by mouth daily.     gabapentin 300 MG capsule  Commonly known as:  NEURONTIN  Take 300 mg by mouth 2 (two) times daily.     HYDROcodone-acetaminophen 5-325 MG per tablet  Commonly known as:  NORCO/VICODIN  Take 1 tablet by mouth 2 (two) times daily as needed for moderate pain.     insulin aspart 100 UNIT/ML injection  Commonly known as:  novoLOG  Inject 10-30 Units into the skin 2 (two) times daily before a meal. Sliding scale     insulin glargine 100 UNIT/ML injection  Commonly known as:  LANTUS  Inject 60 Units into the skin 2 (two) times daily.     lisinopril 10 MG tablet  Commonly known as:  PRINIVIL,ZESTRIL  Take 10 mg by mouth daily.     potassium chloride 10 MEQ tablet  Commonly known as:  K-DUR,KLOR-CON  Take 1 tablet (10 mEq total) by mouth daily.     ranitidine 300 MG tablet  Commonly known as:  ZANTAC  Take 300 mg by mouth daily.     sitaGLIPtin 100 MG tablet  Commonly known as:  JANUVIA  Take 100 mg by mouth daily.     traMADol 50 MG tablet  Commonly known as:  ULTRAM  Take 1-2 tablets (50-100 mg total) by mouth every 6 (six) hours as needed (mild pain).       Follow-up Information   Follow up with Gaye Pollack, MD In 2 weeks. (Office will contact you with appointment)    Specialty:  Cardiothoracic Surgery   Contact information:   [place] [address] [phone]       Follow up with [facility] In  2 weeks. (Please get CXR 1 hour prior to your appointment with Dr. Cyndia Bent)    Specialty:  Diagnostic Radiology   Contact information:   [phone] [place] [address] [id number]       Signed: Ellwood Handler 03/08/2014, 10:59 AM

## 2014-03-08 NOTE — Progress Notes (Signed)
      [address]       [place] [address]             [phone]      3 Days Post-Op Procedure(s) (LRB): VIDEO ASSISTED THORACOSCOPY (Left) LUNG BIOPSY (Left)  Subjective:  Ms. Molski is doing well this morning.  She has no complaints of pain since PCA was discontinued.  She is ambulating without difficulty.  She is ready to be discharged today.  Objective: Vital signs in last 24 hours: Temp:  [98 F (36.7 C)-98.6 F (37 C)] 98.6 F (37 C) (07/12 0434) Pulse Rate:  [73-82] 73 (07/12 0434) Cardiac Rhythm:  [-] Normal sinus rhythm (07/11 2100) Resp:  [14-20] 18 (07/12 0434) BP: (130-149)/(56-76) 133/71 mmHg (07/12 0434) SpO2:  [97 %-100 %] 100 % (07/12 0434)  Intake/Output from previous day: 07/11 0701 - 07/12 0700 In: 720 [P.O.:420; I.V.:300] Out: 180 [Urine:100; Chest Tube:80]  General appearance: alert, cooperative and no distress Heart: regular rate and rhythm Lungs: clear to auscultation bilaterally Wound: clean, some serous drainage present, blisters from use of tape  Lab Results:  Recent Labs  03/06/14 0345 03/08/14 0350  WBC 9.0 7.7  HGB 9.3* 9.2*  HCT 29.0* 28.5*  PLT 251 265   BMET:  Recent Labs  03/06/14 0345 03/08/14 0350  NA 137 136*  K 4.6 4.1  CL 99 97  CO2 26 26  GLUCOSE 139* 182*  BUN 16 9  CREATININE 0.60 0.49*  CALCIUM 8.7 8.9    PT/INR: No results found for this basename: LABPROT, INR,  in the last 72 hours ABG    Component Value Date/Time   PHART 7.400 03/06/2014 0358   HCO3 28.3* 03/06/2014 0358   TCO2 30 03/06/2014 0358   O2SAT 93.0 03/06/2014 0358   CBG (last 3)   Recent Labs  03/07/14 1712 03/07/14 2111 03/08/14 0623  GLUCAP 219* 222* 184*    Assessment/Plan: S/P Procedure(s) (LRB): VIDEO ASSISTED THORACOSCOPY (Left) LUNG BIOPSY (Left)  1. Chest tube- removed yesterday, CXR remains stable, no pneumothorax present 2. Pulm- patient uses oxygen at home, encouraged good use of IS 3. Pain- well  controlled with use of oral medication 4. Dispo- patient doing well, Pathology remains pending, will d/c home today   LOS: 3 days    Ahmed Prima, [facility] 03/08/2014

## 2014-03-08 NOTE — Progress Notes (Signed)
Discharged to home with family office visits in place teaching done  

## 2014-03-09 LAB — TISSUE CULTURE: Culture: NO GROWTH

## 2014-03-19 ENCOUNTER — Other Ambulatory Visit: Payer: Self-pay | Admitting: Surgery

## 2014-03-19 DIAGNOSIS — R918 Other nonspecific abnormal finding of lung field: Secondary | ICD-10-CM

## 2014-03-23 ENCOUNTER — Ambulatory Visit
Admission: RE | Admit: 2014-03-23 | Discharge: 2014-03-23 | Disposition: A | Discharge status: Home / Self Care | Payer: 59 | Source: Ambulatory Visit | Attending: Surgery | Admitting: Surgery

## 2014-03-23 ENCOUNTER — Ambulatory Visit (INDEPENDENT_AMBULATORY_CARE_PROVIDER_SITE_OTHER): Payer: 59 | Admitting: Physician Assistant

## 2014-03-23 VITALS — BP 157/74 | HR 95 | Ht 63.0 in | Wt 250.0 lb

## 2014-03-23 DIAGNOSIS — R918 Other nonspecific abnormal finding of lung field: Secondary | ICD-10-CM

## 2014-03-23 NOTE — Progress Notes (Signed)
HPI: Patient returns for routine postoperative follow-up having undergone Left Vats for Wedge Resection of Pulmonary nodules on 03/05/2014.  The patient's early postoperative recovery while in the hospital was unremarkable.  Since hospital discharge the patient reports for the most part she has done well.  She states she did have a several day history of fever associated with non purulent cough and some diarrhea.  She states her surgical incisions were healing well and she felt this was most likely a viral illness.  These symptoms have since resolved and she states that her friend is not suffering the same symptoms.  She does question her final pathology results which were given to the patient.   Current Outpatient Prescriptions  Medication Sig Dispense Refill  . albuterol (PROVENTIL HFA;VENTOLIN HFA) 108 (90 BASE) MCG/ACT inhaler Inhale 2 puffs into the lungs every 6 (six) hours as needed for wheezing or shortness of breath.  1 Inhaler  6  . aspirin EC 81 MG tablet Take 81 mg by mouth daily.      [name] atorvastatin (LIPITOR) 10 MG tablet Take 10 mg by mouth daily.       . diphenhydrAMINE (BENADRYL) 25 mg capsule Take 25-50 mg by mouth at bedtime as needed for sleep.       . furosemide (LASIX) 40 MG tablet Take 80 mg by mouth daily.      [name] gabapentin (NEURONTIN) 300 MG capsule Take 300 mg by mouth 2 (two) times daily.      [name] HYDROcodone-acetaminophen (NORCO/VICODIN) 5-325 MG per tablet Take 1 tablet by mouth 2 (two) times daily as needed for moderate pain.       [name] insulin aspart (NOVOLOG) 100 UNIT/ML injection Inject 10-30 Units into the skin 2 (two) times daily before a meal. Sliding scale      . insulin glargine (LANTUS) 100 UNIT/ML injection Inject 60 Units into the skin 2 (two) times daily.       [name] lisinopril (PRINIVIL,ZESTRIL) 10 MG tablet Take 10 mg by mouth daily.      . Multiple Vitamins-Minerals (CENTRUM ADULTS) TABS Take 1 tablet by mouth daily.      . potassium chloride (K-DUR,KLOR-CON) 10  MEQ tablet Take 1 tablet (10 mEq total) by mouth daily.  30 tablet  0  . ranitidine (ZANTAC) 300 MG tablet Take 300 mg by mouth daily.      . sitaGLIPtin (JANUVIA) 100 MG tablet Take 100 mg by mouth daily.       . traMADol (ULTRAM) 50 MG tablet Take 1-2 tablets (50-100 mg total) by mouth every 6 (six) hours as needed (mild pain).  30 tablet  0   No current facility-administered medications for this visit.    Physical Exam:  BP 157/74  Pulse 95  Ht 5\' 3"  (1.6 m)  Wt 250 lb (113.399 kg)  BMI 44.30 kg/m2  SpO2 89%  LMP 08/28/2008  Gen: no apparent distress Heart: RRR Lungs: CTA bilaterally Wound: clean and dry, well healed, chest tube suture removed without difficulty.  Diagnostic Tests:  CXR: shows post surgical changes, small pleural effusion on left, new pulmonary nodule on right, that was not present last film  A/P:  1. Multiple Pulmonary Nodules- pathology from 7/9 shows granulomatous inflammation no evidence of malignancy.  Final fungal and AFB cultures are negative to date, however final report will be a view weeks yet 2. Severe COPD- continue use of home oxygen, plan to follow up with Dr. Lamonte Sakai on 03/31/2014 3. Dispo- patient medically stable,  instructed to RTC on 4-6 weeks with CXR to follow up with Dr. Cyndia Bent.  However, patient instructed to contact our office sooner should the need arise

## 2014-03-31 ENCOUNTER — Ambulatory Visit: Payer: 59 | Admitting: Emergency Medicine

## 2014-03-31 ENCOUNTER — Encounter: Payer: Self-pay | Admitting: Emergency Medicine

## 2014-04-02 ENCOUNTER — Telehealth: Payer: Self-pay | Admitting: Emergency Medicine

## 2014-04-02 NOTE — Telephone Encounter (Signed)
Pt states she has been on medical leave since Jan for her current problem. She states Dr. Delfina Redwood originally wrote her out but she has not followed up with him as regular as Dr. Lamonte Sakai. Pt states she just had a VATS procedure done on 7-9 and is still recovering from this. She states she needs a letter faxed to the # given above stating she does not currently have a return to work date so her medical leave can be extended. Last OV with RB was on 02-09-14 and next OV is set for 04-23-14. Please advise. [place] Bing, CMA

## 2014-04-02 NOTE — Telephone Encounter (Signed)
I will fax a letter for her on 8/7. Agree with the plan out of work until our follow up.

## 2014-04-03 LAB — FUNGUS CULTURE W SMEAR: Fungal Smear: NONE SEEN

## 2014-04-07 NOTE — Telephone Encounter (Signed)
No letter done in epic Called HR and spoke with Butch Penny who reported that no work note was received and she requests this be sent to [phone] attn Arbie Cookey As RB has already [facility] for pt stay out of work until her 8.27.15 ov, will do work note and fax it to [employer]  Work note done and faxed LMOM TCB x1 for pt

## 2014-04-08 NOTE — Telephone Encounter (Signed)
lmtcb x2 

## 2014-04-09 NOTE — Telephone Encounter (Signed)
lmtcb x3 

## 2014-04-10 NOTE — Telephone Encounter (Signed)
lmtcb at both #'s listed

## 2014-04-13 NOTE — Telephone Encounter (Signed)
Pt advised. Marissa Wilkerson, CMA  

## 2014-04-18 LAB — AFB CULTURE WITH SMEAR (NOT AT ARMC): ACID FAST SMEAR: NONE SEEN

## 2014-04-23 ENCOUNTER — Encounter: Payer: Self-pay | Admitting: Emergency Medicine

## 2014-04-23 ENCOUNTER — Ambulatory Visit (INDEPENDENT_AMBULATORY_CARE_PROVIDER_SITE_OTHER): Payer: 59 | Admitting: Emergency Medicine

## 2014-04-23 VITALS — BP 138/82 | HR 84 | Ht 63.0 in | Wt 230.4 lb

## 2014-04-23 DIAGNOSIS — R918 Other nonspecific abnormal finding of lung field: Secondary | ICD-10-CM

## 2014-04-23 NOTE — Progress Notes (Signed)
Subjective:    Patient ID: Marissa Wilkerson, female    DOB: [birth year], 53 y.o.   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 I saw her initially in consultation after admission to [facility] for hypoxemia 09/02/13. She had experienced dyspnea that has been insidious over about 3-4 weeks. She denied FCS or purulent sputum production. No recent URI or unusual exposures or travels. She was treated with HCTZ in 9/'14 by Dr Delfina Redwood when she developed significant LE edema. TTE from 10/14 showed no evidence of PAH or LV dysfxn. Following her presentation to [facility] CT-PA revealed Abnormal lungs with numerous indistinct bilateral pulmonary nodules (semi-solid appearing), with superimposed confluent mass like pulmonary opacity in the left lower lung crossing the major fissure. There was no PE.  We performed serologies and FOB during the admission. She was discharged to home on O2 with plan to f/u today. She feels some dyspnea w exertion. Continues to desat with exertion on RA.   ROV 10/22/13 -- follows up for dyspnea, hypoxemia, nodular disease on her Ct chest. After negative culture data on FOB we initiated steroid trial, 40mg  daily. Returns today  > no changes in her breathing but side effects > edema, wt gain of 30 lbs. CBG's have been higher. Her lowest SpO2 with exertion on RA has been 82%.   ROV 10/30/13 -- dyspnea, hypoxemia, nodular disease on CT chest. FOB negative. Trial steroids did not change her SOB. Repeat Ct scan >>  2//25/15 . She has seen documented desats with exertion. She is still wearing O2 w exertion. No cough or wheeze. She does have low back pain.   ROV 12/31/13 -- follow up for dyspnea, hypoxemia, nodular disease on CT chest. FOB negative for organisms. Her repeat CT scan > large scale resolution of the pneumonitis and nodular disease with some residual micro-nodular disease. She has gained  40 lbs on the pred > now off. She has documented exertional desats to 80's on RA. She hasn't really benefited from albuterol. She is doing physical therapy 2x a week.   ROV 01/30/14 -- follow up for dyspnea, hypoxemia, nodular disease on CT chest. Repeat Ct scan showed significant improvement in her pneumonitis. She returns describing stable dyspnea. We did a trial of symbicort but she didn't benefit so stopped it. She is wearing 2L/min typically. She has lost 3 lbs since last time.  No cough, occasionally hears some wheeze. She is having GERD sx.   ROV 02/09/14 -- dyspnea, hypoxemia, nodular disease on CT chest. Repeat Ct scan 11/14/13 showed significant improvement in her pneumonitis. Her dyspnea has not really improved despite steroids and the improved CT. She did gain wt on pred, but hard to ascribe all her SOB to this. We repeated CT 02/06/14 as below. She has wheeze but only rarely.     Review of Systems  Constitutional: Positive for activity change and unexpected weight change. Negative for fever.  HENT: Negative for congestion, dental problem, ear pain, nosebleeds, postnasal drip, rhinorrhea, sinus pressure, sneezing, sore throat and trouble swallowing.   Eyes: Negative for redness and itching.  Respiratory: Positive for shortness of breath. Negative for cough, chest tightness and wheezing.   Cardiovascular: Positive for leg swelling. Negative for palpitations.  Gastrointestinal: Negative for nausea and vomiting.  Genitourinary: Negative for dysuria.  Musculoskeletal: Positive for back pain. Negative for joint swelling.  Skin: Negative for rash.  Neurological: Negative for headaches.  Hematological: Does not bruise/bleed easily.  Psychiatric/Behavioral: Negative for dysphoric mood. The patient is not nervous/anxious.        Objective:   Physical Exam Filed Vitals:   04/23/14 1630  BP: 138/82  Pulse: 84  Height: 5\' 3"  (1.6 m)  Weight: 230 lb 6.4 oz (104.509 kg)  SpO2: 96%    Gen:  Pleasant, overwt woman, in no distress,  normal affect  ENT: No lesions,  mouth clear,  oropharynx clear, no postnasal drip, evolving some cushingoid features  Neck: No JVD, no stridor  Lungs: No use of accessory muscles, clear without rales or rhonchi, no wheeze  Cardiovascular: RRR, heart sounds normal, no murmur or gallops, no peripheral edema  Musculoskeletal: No deformities, no cyanosis or clubbing  Neuro: alert, non focal  Skin: Warm, no lesions or rashes   Testing:  BAL fungal smear 1/8 >> negative, cx pending BAL AFB smear 1/8 >> negative, cx pending BAL cytology 1/8 >> negative Brushings cytology 1/8 >> negative TBBx's path 1/8 >> negative RF, ANA, ANCA, 1/8 >> all negative  IgE 1/7 >> 55 (normal)  Eosinophil count 1/7 >> 0.4 (normal)  Fungal hypersensitivity panel 1/7 >> all negative Quantiferon gold 1/7 >> negative HIV 1/7 >> negative   10/27/13 --  COMPARISON: CT ANGIO CHEST W/CM &/OR WO/CM dated 09/02/2013  FINDINGS:  Mediastinal lymph nodes are not enlarged by CT size criteria. Hilar  regions are difficult to definitively evaluate without IV contrast.  No axillary adenopathy. Heart is enlarged. No pericardial effusion.  Mild mosaic attenuation. Scattered linear scarring. Previously seen  areas of nodular and masslike airspace consolidation have resolved  in the interval. Scattered pulmonary nodules measure 4 mm or less in  size and are unchanged. No pleural fluid. Airway is unremarkable.  Incidental imaging of the upper abdomen shows the visualized  portions of the liver , adrenal glands, kidneys, spleen, pancreas,  stomach and bowel to be grossly unremarkable. Cholecystectomy. No  upper abdominal adenopathy. No worrisome lytic or sclerotic lesions.  IMPRESSION:  1. Interval clearing of previously seen areas of nodular and  masslike airspace consolidation, most consistent with resolved  pneumonia.  2. Additional scattered pulmonary nodules measure 4 mm or  less in  size and are unchanged. If the patient is at high risk for  bronchogenic carcinoma, follow-up chest CT at 1 year is recommended.  If the patient is at low risk, no follow-up is needed. This  recommendation follows the consensus statement: Guidelines for  Management of Small Pulmonary Nodules Detected on CT Scans: A  Statement from the [employer] as published in Radiology  2005; 237:395-400.  3. Mild mosaic pulmonary parenchymal attenuation can be seen in the  setting of small airways disease       Assessment & Plan:  Pulmonary nodules/lesions, multiple VATS bx performed by Dr Cyndia Bent, shows widespread granulomatous disease with some associated necrosis. No organisms seen. The DDx is HSP and sarcoidosis. No clear precipitating exposure identified to suggest HSP. She has a remote hx toxoplasmosis, but I do not know of any connection between toxo and HSP. At this point I would like to treat with scheduled pred, follow CT chest for resolution. She may end up needed long-term steroid-sparing agent. I will see her in 6 weeks after the CT scan   Obstructive lung disease - albuterol prn

## 2014-04-23 NOTE — Assessment & Plan Note (Signed)
albuterol prn

## 2014-04-23 NOTE — Assessment & Plan Note (Signed)
VATS bx performed by Dr Cyndia Bent, shows widespread granulomatous disease with some associated necrosis. No organisms seen. The DDx is HSP and sarcoidosis. No clear precipitating exposure identified to suggest HSP. She has a remote hx toxoplasmosis, but I do not know of any connection between toxo and HSP. At this point I would like to treat with scheduled pred, follow CT chest for resolution. She may end up needed long-term steroid-sparing agent. I will see her in 6 weeks after the CT scan

## 2014-04-23 NOTE — Patient Instructions (Signed)
We will start Prednisone 30mg  daily  We will repeat your high resolution CT scan of the chest in 6 weeks Follow with Dr Lamonte Sakai in 6 weeks after your scan to discuss your results.  Wear your oxygen at all times. Use your albuterol 2 puffs as needed for shortness of breath.

## 2014-04-24 ENCOUNTER — Encounter: Payer: Self-pay | Admitting: Emergency Medicine

## 2014-04-24 MED ORDER — PREDNISONE 10 MG PO TABS: 30.0000 mg | ORAL_TABLET | Freq: Every day | ORAL | Status: DC

## 2014-04-27 ENCOUNTER — Other Ambulatory Visit: Payer: Self-pay | Admitting: Surgery

## 2014-04-27 DIAGNOSIS — J449 Chronic obstructive pulmonary disease, unspecified: Secondary | ICD-10-CM

## 2014-04-29 ENCOUNTER — Ambulatory Visit: Payer: Self-pay | Admitting: Surgery

## 2014-04-29 ENCOUNTER — Encounter: Payer: Self-pay | Admitting: Emergency Medicine

## 2014-04-29 NOTE — Telephone Encounter (Signed)
RB, please advise on additional recs for helping patient sleep. I have taken care of the Prednisone Rx. Thanks.

## 2014-05-06 ENCOUNTER — Ambulatory Visit: Payer: Self-pay | Admitting: Surgery

## 2014-05-27 ENCOUNTER — Ambulatory Visit
Admission: RE | Admit: 2014-05-27 | Discharge: 2014-05-27 | Disposition: A | Discharge status: Home / Self Care | Payer: BC Managed Care – PPO | Source: Ambulatory Visit | Attending: Surgery | Admitting: Surgery

## 2014-05-27 ENCOUNTER — Ambulatory Visit (INDEPENDENT_AMBULATORY_CARE_PROVIDER_SITE_OTHER): Payer: 59 | Admitting: Surgery

## 2014-05-27 VITALS — BP 148/82 | HR 95 | Resp 16 | Ht 63.0 in | Wt 230.0 lb

## 2014-05-27 DIAGNOSIS — R918 Other nonspecific abnormal finding of lung field: Secondary | ICD-10-CM

## 2014-05-27 DIAGNOSIS — J449 Chronic obstructive pulmonary disease, unspecified: Secondary | ICD-10-CM

## 2014-05-27 DIAGNOSIS — Z09 Encounter for follow-up examination after completed treatment for conditions other than malignant neoplasm: Secondary | ICD-10-CM

## 2014-05-28 ENCOUNTER — Encounter: Payer: Self-pay | Admitting: Surgery

## 2014-05-28 NOTE — Progress Notes (Signed)
HPI:  Patient returns for routine postoperative follow-up having undergone left VATS and wedge resection of the superior segment for biopsy  on 03/06/2015. The patient's early postoperative recovery while in the hospital was notable for an uncomplicated postop course. Since hospital discharge the patient reports that she has been stable. Her breathing has not changed. 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 She remains on Prednisone.   Current Outpatient Prescriptions  Medication Sig Dispense Refill  . albuterol (PROVENTIL HFA;VENTOLIN HFA) 108 (90 BASE) MCG/ACT inhaler Inhale 2 puffs into the lungs every 6 (six) hours as needed for wheezing or shortness of breath.  1 Inhaler  6  . aspirin EC 81 MG tablet Take 81 mg by mouth daily.      [name] atorvastatin (LIPITOR) 10 MG tablet Take 10 mg by mouth daily.       . diphenhydrAMINE (BENADRYL) 25 mg capsule Take 25-50 mg by mouth at bedtime as needed for sleep.       . furosemide (LASIX) 40 MG tablet Take 80 mg by mouth daily.      [name] gabapentin (NEURONTIN) 300 MG capsule Take 300mg  in AM and 600mg  in PM      . HYDROcodone-acetaminophen (NORCO/VICODIN) 5-325 MG per tablet Take 1 tablet by mouth 2 (two) times daily as needed for moderate pain.       [name] insulin aspart (NOVOLOG) 100 UNIT/ML injection Inject 10-30 Units into the skin 2 (two) times daily before a meal. Sliding scale      . insulin glargine (LANTUS) 100 UNIT/ML injection Inject 60 Units into the skin 2 (two) times daily.       [name] lisinopril (PRINIVIL,ZESTRIL) 10 MG tablet Take 10 mg by mouth daily.      . Multiple Vitamins-Minerals (CENTRUM ADULTS) TABS Take 1 tablet by mouth daily.      . potassium chloride (K-DUR,KLOR-CON) 10 MEQ tablet Take 1 tablet (10 mEq total) by mouth daily.  30 tablet  0  . predniSONE (DELTASONE) 10 MG tablet Take 3 tablets (30 mg total) by mouth daily with breakfast.  90 tablet  3  . ranitidine (ZANTAC) 300 MG tablet Take 300 mg by  mouth daily.      . sitaGLIPtin (JANUVIA) 100 MG tablet Take 100 mg by mouth daily.        No current facility-administered medications for this visit.    Physical Exam: BP 148/82  Pulse 95  Resp 16  Ht 5\' 3"  (1.6 m)  Wt 230 lb (104.327 kg)  BMI 40.75 kg/m2  SpO2 95%  LMP 08/28/2008 She looks comfortable Lung exam is clear The chest incisions are healing well  Diagnostic Tests:  CLINICAL DATA: 53 year old female status post VATS 2 months ago.  Granulomatous nodules.  EXAM:  CHEST 2 VIEW  COMPARISON: 03/23/2014 and multiple prior chest radiographs  FINDINGS:  Cardiomegaly and left lung postoperative changes again identified.  There is no evidence of pneumothorax, pulmonary edema or airspace  disease.  Interval resolution of left basilar atelectasis noted.  Faint nodular opacities within both lungs are again identified.  IMPRESSION:  Faint bilateral pulmonary nodular opacities again noted.  Left lung postoperative changes without pneumothorax. Interval  resolution of left basilar atelectasis.  Electronically Signed  By: Hassan Rowan M.D.  On: 05/27/2014 13:00    Impression:  She is making a good recovery after her lung biopsy. I told her she can return to her normal activities.  Plan:  She will follow up with Dr.  Byrum.

## 2014-06-02 ENCOUNTER — Other Ambulatory Visit: Payer: Self-pay | Admitting: Internal Medicine

## 2014-06-02 ENCOUNTER — Telehealth: Payer: Self-pay | Admitting: Emergency Medicine

## 2014-06-02 DIAGNOSIS — Z1231 Encounter for screening mammogram for malignant neoplasm of breast: Secondary | ICD-10-CM

## 2014-06-02 DIAGNOSIS — R918 Other nonspecific abnormal finding of lung field: Secondary | ICD-10-CM

## 2014-06-02 NOTE — Telephone Encounter (Signed)
Per last OV note: We will repeat your high resolution CT scan of the chest in 6 weeks  Follow with Dr Lamonte Sakai in 6 weeks after your scan to discuss your results.  Pt has an appt on 06-09-14. I have placed the order for pt to have repeat scan. Pt is aware [facility] will call to schedule. [place] Bing, CMA

## 2014-06-08 ENCOUNTER — Ambulatory Visit (INDEPENDENT_AMBULATORY_CARE_PROVIDER_SITE_OTHER)
Admission: RE | Admit: 2014-06-08 | Discharge: 2014-06-08 | Disposition: A | Discharge status: Home / Self Care | Payer: BC Managed Care – PPO | Source: Ambulatory Visit | Attending: Emergency Medicine | Admitting: Emergency Medicine

## 2014-06-08 DIAGNOSIS — R918 Other nonspecific abnormal finding of lung field: Secondary | ICD-10-CM

## 2014-06-09 ENCOUNTER — Ambulatory Visit (INDEPENDENT_AMBULATORY_CARE_PROVIDER_SITE_OTHER): Payer: BC Managed Care – PPO | Admitting: Emergency Medicine

## 2014-06-09 ENCOUNTER — Encounter: Payer: Self-pay | Admitting: Emergency Medicine

## 2014-06-09 ENCOUNTER — Ambulatory Visit: Payer: BC Managed Care – PPO | Admitting: Endocrinology

## 2014-06-09 VITALS — BP 150/76 | HR 86 | Temp 97.7°F | Ht 63.0 in | Wt 262.0 lb

## 2014-06-09 DIAGNOSIS — D860 Sarcoidosis of lung: Secondary | ICD-10-CM

## 2014-06-09 NOTE — Assessment & Plan Note (Addendum)
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 Her most recent Ct scan shows resolution of her nodules after prednisone. She is very interested in stopping the pred and changing to an alternative. Will check CBC today, her LFT on 10/6 with Dr Delfina Redwood were normal (I reviewed on [employer]). Plan to start MTX

## 2014-06-09 NOTE — Patient Instructions (Signed)
Lab work today Decrease your prednisone to 20mg  daily We will plan to start methotrexate weekly  Follow with Dr Lamonte Sakai in 1 month

## 2014-06-09 NOTE — Progress Notes (Signed)
Subjective:    Patient ID: Marissa Wilkerson, female    DOB: [birth year], 53 y.o.   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 I saw her initially in consultation after admission to [facility] for hypoxemia 09/02/13. She had experienced dyspnea that has been insidious over about 3-4 weeks. She denied FCS or purulent sputum production. No recent URI or unusual exposures or travels. She was treated with HCTZ in 9/'14 by Dr Delfina Redwood when she developed significant LE edema. TTE from 10/14 showed no evidence of PAH or LV dysfxn. Following her presentation to [facility] CT-PA revealed Abnormal lungs with numerous indistinct bilateral pulmonary nodules (semi-solid appearing), with superimposed confluent mass like pulmonary opacity in the left lower lung crossing the major fissure. There was no PE.  We performed serologies and FOB during the admission. She was discharged to home on O2 with plan to f/u today. She feels some dyspnea w exertion. Continues to desat with exertion on RA.   ROV 10/22/13 -- follows up for dyspnea, hypoxemia, nodular disease on her Ct chest. After negative culture data on FOB we initiated steroid trial, 40mg  daily. Returns today  > no changes in her breathing but side effects > edema, wt gain of 30 lbs. CBG's have been higher. Her lowest SpO2 with exertion on RA has been 82%.   ROV 10/30/13 -- dyspnea, hypoxemia, nodular disease on CT chest. FOB negative. Trial steroids did not change her SOB. Repeat Ct scan >>  2//25/15 . She has seen documented desats with exertion. She is still wearing O2 w exertion. No cough or wheeze. She does have low back pain.   ROV 12/31/13 -- follow up for dyspnea, hypoxemia, nodular disease on CT chest. FOB negative for organisms. Her repeat CT scan > large scale resolution of the pneumonitis and nodular disease with some residual micro-nodular disease. She has gained  40 lbs on the pred > now off. She has documented exertional desats to 80's on RA. She hasn't really benefited from albuterol. She is doing physical therapy 2x a week.   ROV 01/30/14 -- follow up for dyspnea, hypoxemia, nodular disease on CT chest. Repeat Ct scan showed significant improvement in her pneumonitis. She returns describing stable dyspnea. We did a trial of symbicort but she didn't benefit so stopped it. She is wearing 2L/min typically. She has lost 3 lbs since last time.  No cough, occasionally hears some wheeze. She is having GERD sx.   ROV 02/09/14 -- dyspnea, hypoxemia, nodular disease on CT chest. Repeat Ct scan 11/14/13 showed significant improvement in her pneumonitis. Her dyspnea has not really improved despite steroids and the improved CT. She did gain wt on pred, but hard to ascribe all her SOB to this. We repeated CT 02/06/14 as below. She has wheeze but only rarely.   ROV 06/09/14 -- follow up for steroid-responsive pneumonitis, VATS bx in July '15 consistent with sarcoidosis. She is on pred 30mg  qd and has gained 30 lbs. Her SOB is worse likely due to wt gain.     Review of Systems  Constitutional: Positive for activity change and unexpected weight change. Negative for fever.  HENT: Negative for congestion, dental problem, ear pain, nosebleeds, postnasal drip, rhinorrhea, sinus pressure, sneezing, sore throat and trouble swallowing.   Eyes: Negative for redness and itching.  Respiratory: Positive for shortness of breath. Negative for cough, chest tightness and  wheezing.   Cardiovascular: Positive for leg swelling. Negative for palpitations.  Gastrointestinal: Negative for nausea and vomiting.  Genitourinary: Negative for dysuria.  Musculoskeletal: Positive for back pain. Negative for joint swelling.  Skin: Negative for rash.  Neurological: Negative for headaches.  Hematological: Does not bruise/bleed easily.  Psychiatric/Behavioral: Negative for dysphoric mood. The patient is  not nervous/anxious.        Objective:   Physical Exam Filed Vitals:   06/09/14 1641  BP: 150/76  Pulse: 86  Temp: 97.7 F (36.5 C)  TempSrc: Oral  Height: 5\' 3"  (1.6 m)  Weight: 262 lb (118.842 kg)  SpO2: 95%    Gen: Pleasant, overwt woman, in no distress,  normal affect  ENT: No lesions,  mouth clear,  oropharynx clear, no postnasal drip, evolving some cushingoid features  Neck: No JVD, no stridor  Lungs: No use of accessory muscles, clear without rales or rhonchi, no wheeze  Cardiovascular: RRR, heart sounds normal, no murmur or gallops, no peripheral edema  Musculoskeletal: No deformities, no cyanosis or clubbing  Neuro: alert, non focal  Skin: Warm, no lesions or rashes   Testing:  BAL fungal smear 1/8 >> negative, cx pending BAL AFB smear 1/8 >> negative, cx pending BAL cytology 1/8 >> negative Brushings cytology 1/8 >> negative TBBx's path 1/8 >> negative RF, ANA, ANCA, 1/8 >> all negative  IgE 1/7 >> 55 (normal)  Eosinophil count 1/7 >> 0.4 (normal)  Fungal hypersensitivity panel 1/7 >> all negative Quantiferon gold 1/7 >> negative HIV 1/7 >> negative   10/27/13 --  COMPARISON: CT ANGIO CHEST W/CM &/OR WO/CM dated 09/02/2013  FINDINGS:  Mediastinal lymph nodes are not enlarged by CT size criteria. Hilar  regions are difficult to definitively evaluate without IV contrast.  No axillary adenopathy. Heart is enlarged. No pericardial effusion.  Mild mosaic attenuation. Scattered linear scarring. Previously seen  areas of nodular and masslike airspace consolidation have resolved  in the interval. Scattered pulmonary nodules measure 4 mm or less in  size and are unchanged. No pleural fluid. Airway is unremarkable.  Incidental imaging of the upper abdomen shows the visualized  portions of the liver , adrenal glands, kidneys, spleen, pancreas,  stomach and bowel to be grossly unremarkable. Cholecystectomy. No  upper abdominal adenopathy. No worrisome lytic or  sclerotic lesions.  IMPRESSION:  1. Interval clearing of previously seen areas of nodular and  masslike airspace consolidation, most consistent with resolved  pneumonia.  2. Additional scattered pulmonary nodules measure 4 mm or less in  size and are unchanged. If the patient is at high risk for  bronchogenic carcinoma, follow-up chest CT at 1 year is recommended.  If the patient is at low risk, no follow-up is needed. This  recommendation follows the consensus statement: Guidelines for  Management of Small Pulmonary Nodules Detected on CT Scans: A  Statement from the [place] as published in Radiology  2005; 237:395-400.  3. 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 Her most recent Ct scan shows resolution of her nodules after prednisone. She is very interested in stopping the pred and changing to an alternative. Will check CBC today, her LFT on 10/6 with Dr Delfina Redwood were normal (I reviewed on [employer]). Plan to start MTX

## 2014-06-10 ENCOUNTER — Other Ambulatory Visit (HOSPITAL_COMMUNITY)
Admission: RE | Admit: 2014-06-10 | Discharge: 2014-06-10 | Disposition: A | Discharge status: Home / Self Care | Payer: BC Managed Care – PPO | Source: Ambulatory Visit | Attending: Obstetrics & Gynecology | Admitting: Obstetrics & Gynecology

## 2014-06-10 ENCOUNTER — Other Ambulatory Visit: Payer: Self-pay | Admitting: Obstetrics & Gynecology

## 2014-06-10 ENCOUNTER — Other Ambulatory Visit (INDEPENDENT_AMBULATORY_CARE_PROVIDER_SITE_OTHER): Payer: BC Managed Care – PPO

## 2014-06-10 DIAGNOSIS — Z1151 Encounter for screening for human papillomavirus (HPV): Secondary | ICD-10-CM | POA: Insufficient documentation

## 2014-06-10 DIAGNOSIS — Z01419 Encounter for gynecological examination (general) (routine) without abnormal findings: Secondary | ICD-10-CM | POA: Diagnosis present

## 2014-06-10 DIAGNOSIS — D860 Sarcoidosis of lung: Secondary | ICD-10-CM

## 2014-06-10 LAB — CBC WITH DIFFERENTIAL/PLATELET
BASOS ABS: 0 10*3/uL (ref 0.0–0.1)
BASOS PCT: 0.1 % (ref 0.0–3.0)
EOS ABS: 0.1 10*3/uL (ref 0.0–0.7)
Eosinophils Relative: 1 % (ref 0.0–5.0)
HCT: 32.9 % — ABNORMAL LOW (ref 36.0–46.0)
Hemoglobin: 10.9 g/dL — ABNORMAL LOW (ref 12.0–15.0)
LYMPHS PCT: 11.5 % — AB (ref 12.0–46.0)
Lymphs Abs: 1.5 10*3/uL (ref 0.7–4.0)
MCHC: 33.1 g/dL (ref 30.0–36.0)
MCV: 87 fl (ref 78.0–100.0)
MONO ABS: 0.9 10*3/uL (ref 0.1–1.0)
Monocytes Relative: 6.8 % (ref 3.0–12.0)
NEUTROS PCT: 80.6 % — AB (ref 43.0–77.0)
Neutro Abs: 10.7 10*3/uL — ABNORMAL HIGH (ref 1.4–7.7)
Platelets: 318 10*3/uL (ref 150.0–400.0)
RBC: 3.78 Mil/uL — AB (ref 3.87–5.11)
RDW: 15.5 % (ref 11.5–15.5)
WBC: 13.3 10*3/uL — ABNORMAL HIGH (ref 4.0–10.5)

## 2014-06-11 ENCOUNTER — Ambulatory Visit: Payer: 59

## 2014-06-11 LAB — CYTOLOGY - PAP

## 2014-06-12 ENCOUNTER — Telehealth: Payer: Self-pay | Admitting: Emergency Medicine

## 2014-06-12 DIAGNOSIS — D860 Sarcoidosis of lung: Secondary | ICD-10-CM

## 2014-06-12 DIAGNOSIS — J9601 Acute respiratory failure with hypoxia: Secondary | ICD-10-CM

## 2014-06-12 NOTE — Telephone Encounter (Signed)
Lmtcb. 06/12/2014

## 2014-06-12 NOTE — Telephone Encounter (Signed)
I reviewed CBC > ok to start methotrexate 7.5mg  once a week. She also will need folate 1mg  daily. Please have her come in for a CBC in 2 weeks. Thanks.,

## 2014-06-12 NOTE — Telephone Encounter (Signed)
RB please advise about the pt starting on methotrexate and stopping the prednisone.  Pt called and stated that she has not heard anything back about this change.  Please advise thanks.  Allergies  Allergen Reactions  . Metformin And Related Nausea And Vomiting  . Mobic [Meloxicam] Nausea And Vomiting  . Voltaren [Diclofenac] Nausea And Vomiting  . Sulfa Antibiotics Rash    Current Outpatient Prescriptions on File Prior to Visit  Medication Sig Dispense Refill  . albuterol (PROVENTIL HFA;VENTOLIN HFA) 108 (90 BASE) MCG/ACT inhaler Inhale 2 puffs into the lungs every 6 (six) hours as needed for wheezing or shortness of breath.  1 Inhaler  6  . aspirin EC 81 MG tablet Take 81 mg by mouth daily.      [name] atorvastatin (LIPITOR) 10 MG tablet Take 10 mg by mouth daily.       . diphenhydrAMINE (BENADRYL) 25 mg capsule Take 25-50 mg by mouth at bedtime as needed for sleep.       . furosemide (LASIX) 40 MG tablet Take 80 mg by mouth daily.      [name] gabapentin (NEURONTIN) 300 MG capsule Take 300mg  in AM and 600mg  in PM      . HYDROcodone-acetaminophen (NORCO/VICODIN) 5-325 MG per tablet Take 1 tablet by mouth every 6 (six) hours as needed for moderate pain.       [name] insulin aspart (NOVOLOG) 100 UNIT/ML injection Inject 10-30 Units into the skin 2 (two) times daily before a meal. Sliding scale      . insulin glargine (LANTUS) 100 UNIT/ML injection Inject 60 Units into the skin 2 (two) times daily.       [name] lisinopril (PRINIVIL,ZESTRIL) 10 MG tablet Take 10 mg by mouth daily.      . Multiple Vitamins-Minerals (CENTRUM ADULTS) TABS Take 1 tablet by mouth daily.      . potassium chloride (K-DUR,KLOR-CON) 10 MEQ tablet Take 1 tablet (10 mEq total) by mouth daily.  30 tablet  0  . predniSONE (DELTASONE) 10 MG tablet Take 3 tablets (30 mg total) by mouth daily with breakfast.  90 tablet  3  . ranitidine (ZANTAC) 300 MG tablet Take 300 mg by mouth daily.      . sitaGLIPtin (JANUVIA) 100 MG tablet Take 100 mg by  mouth daily.        No current facility-administered medications on file prior to visit.

## 2014-06-15 MED ORDER — FOLIC ACID 1 MG PO TABS: 1.0000 mg | ORAL_TABLET | Freq: Every day | ORAL | Status: DC

## 2014-06-15 MED ORDER — METHOTREXATE SODIUM 7.5 MG PO TABS: 7.5000 mg | ORAL_TABLET | ORAL | Status: DC

## 2014-06-15 NOTE — Telephone Encounter (Signed)
Pt is calling back from Friday [phone] pt states she screens her calls just start talking and she will pick up/

## 2014-06-15 NOTE — Telephone Encounter (Signed)
Spoke w/patient. Aware of rec's per RB. Rx's called into [facility] pharmacy.  CBCD ordered and patient aware to come back in 2 weeks to have this drawn. Pt is wanting to know if she can taper off Prednisone? Currently taking 20mg .   Please advise Dr Lamonte Sakai. Thanks.

## 2014-06-15 NOTE — Telephone Encounter (Signed)
Called, spoke with pt.  Informed her below recs re: prednisone.  She verbalized understanding and voiced no further questions or concerns at this time.

## 2014-06-15 NOTE — Telephone Encounter (Signed)
Dont change the prednisone yet. She needs to get the MTX into her system

## 2014-06-21 ENCOUNTER — Encounter: Payer: Self-pay | Admitting: Emergency Medicine

## 2014-06-22 DIAGNOSIS — Z0271 Encounter for disability determination: Secondary | ICD-10-CM

## 2014-06-23 ENCOUNTER — Ambulatory Visit: Payer: BC Managed Care – PPO | Admitting: Internal Medicine

## 2014-06-23 NOTE — Telephone Encounter (Signed)
Please let her know that I will be glad to fill out excuse for jury duty.  Also pleas efind out when she has an ROV, when she is to have repeat labwork for her methotrexate. Thanks.

## 2014-06-24 ENCOUNTER — Ambulatory Visit: Payer: BC Managed Care – PPO

## 2014-06-25 ENCOUNTER — Telehealth: Payer: Self-pay | Admitting: Emergency Medicine

## 2014-06-25 NOTE — Telephone Encounter (Signed)
Closed TE in error... Per Dr. Lamonte Sakai, will completed jury letter, pt needs to bring in letter.  Also, Dr. Lamonte Sakai wants to know if she has a ROV and when she is to have her labs drawn for her Methotrexate?  I left a message for patient to return my call.  Awaiting call back.

## 2014-06-26 ENCOUNTER — Telehealth: Payer: Self-pay | Admitting: Emergency Medicine

## 2014-06-26 NOTE — Telephone Encounter (Signed)
RB had no appts available for mid November. appt scheduled for 07/28/14 w/ RB

## 2014-06-26 NOTE — Telephone Encounter (Signed)
Lmtcb. 10/30

## 2014-06-26 NOTE — Telephone Encounter (Signed)
Pt returned call - [phone]

## 2014-06-26 NOTE — Telephone Encounter (Signed)
Pt due in mid Nov  Called to set up rov Had to [facility]

## 2014-06-26 NOTE — Telephone Encounter (Signed)
I couldn't make and appt please help with this

## 2014-06-29 ENCOUNTER — Other Ambulatory Visit: Payer: Self-pay | Admitting: *Deleted

## 2014-06-29 MED ORDER — METHOTREXATE SODIUM 7.5 MG PO TABS: 7.5000 mg | ORAL_TABLET | ORAL | Status: DC

## 2014-06-29 NOTE — Telephone Encounter (Signed)
Spoke to patient, she will bring [employer] today.  Pt requesting refill on Methotrexate, she has ROV on 07/29/14, she is having her bloodwork drawn in the morning for Methotrexate.  To Dr. Lamonte Sakai.. Will need to send enough Methotrexate to last patient until Dec 2nd after her bloodwork returns.

## 2014-06-29 NOTE — Telephone Encounter (Signed)
OK to refill her MTX

## 2014-06-29 NOTE — Telephone Encounter (Signed)
Rx sent 

## 2014-07-01 ENCOUNTER — Other Ambulatory Visit (INDEPENDENT_AMBULATORY_CARE_PROVIDER_SITE_OTHER): Payer: BC Managed Care – PPO

## 2014-07-01 ENCOUNTER — Telehealth: Payer: Self-pay | Admitting: Emergency Medicine

## 2014-07-01 ENCOUNTER — Encounter: Payer: Self-pay | Admitting: Emergency Medicine

## 2014-07-01 DIAGNOSIS — J9601 Acute respiratory failure with hypoxia: Secondary | ICD-10-CM

## 2014-07-01 DIAGNOSIS — D860 Sarcoidosis of lung: Secondary | ICD-10-CM

## 2014-07-01 LAB — CBC WITH DIFFERENTIAL/PLATELET
Basophils Absolute: 0 10*3/uL (ref 0.0–0.1)
Basophils Relative: 0.3 % (ref 0.0–3.0)
EOS ABS: 0.2 10*3/uL (ref 0.0–0.7)
Eosinophils Relative: 1.9 % (ref 0.0–5.0)
HCT: 29 % — ABNORMAL LOW (ref 36.0–46.0)
Hemoglobin: 9.9 g/dL — ABNORMAL LOW (ref 12.0–15.0)
LYMPHS PCT: 18.2 % (ref 12.0–46.0)
Lymphs Abs: 1.6 10*3/uL (ref 0.7–4.0)
MCHC: 34 g/dL (ref 30.0–36.0)
MCV: 88.2 fl (ref 78.0–100.0)
Monocytes Absolute: 0.8 10*3/uL (ref 0.1–1.0)
Monocytes Relative: 8.9 % (ref 3.0–12.0)
NEUTROS PCT: 70.7 % (ref 43.0–77.0)
Neutro Abs: 6.4 10*3/uL (ref 1.4–7.7)
PLATELETS: 325 10*3/uL (ref 150.0–400.0)
RBC: 3.29 Mil/uL — ABNORMAL LOW (ref 3.87–5.11)
RDW: 16.7 % — ABNORMAL HIGH (ref 11.5–15.5)
WBC: 9 10*3/uL (ref 4.0–10.5)

## 2014-07-01 NOTE — Telephone Encounter (Signed)
Form completed.

## 2014-07-01 NOTE — Telephone Encounter (Signed)
Form placed in RB look at. Please advise thanks

## 2014-07-06 ENCOUNTER — Ambulatory Visit: Payer: BC Managed Care – PPO

## 2014-07-07 DIAGNOSIS — Z0271 Encounter for disability determination: Secondary | ICD-10-CM

## 2014-07-08 ENCOUNTER — Encounter: Payer: Self-pay | Admitting: Emergency Medicine

## 2014-07-09 NOTE — Telephone Encounter (Signed)
Dr Lamonte Sakai is scheduled here Wednesday afternoon. Suggest she reduce her prednisone now to 20 mg daily( 2 tabs), the call Wednesday to speak to him for further recommendations and the name of the eye doctor.

## 2014-07-09 NOTE — Telephone Encounter (Signed)
Per 06/12/14 OV note w/ RB:  Marissa Gobble, MD at 06/15/2014 4:11 PM     Status: Signed       Expand All Collapse All   Dont change the prednisone yet. She needs to get the MTX into her system     RB is ll pm link tonight. Will forward to DOD for recs regarding prednisone. Please advise Dr. Annamaria Boots thanks  Allergies  Allergen Reactions  . Metformin And Related Nausea And Vomiting  . Mobic [Meloxicam] Nausea And Vomiting  . Voltaren [Diclofenac] Nausea And Vomiting  . Sulfa Antibiotics Rash     Current Outpatient Prescriptions on File Prior to Visit  Medication Sig Dispense Refill  . albuterol (PROVENTIL HFA;VENTOLIN HFA) 108 (90 BASE) MCG/ACT inhaler Inhale 2 puffs into the lungs every 6 (six) hours as needed for wheezing or shortness of breath. 1 Inhaler 6  . aspirin EC 81 MG tablet Take 81 mg by mouth daily.    [name] atorvastatin (LIPITOR) 10 MG tablet Take 10 mg by mouth daily.     . diphenhydrAMINE (BENADRYL) 25 mg capsule Take 25-50 mg by mouth at bedtime as needed for sleep.     . folic acid (FOLVITE) 1 MG tablet Take 1 tablet (1 mg total) by mouth daily. 30 tablet 1  . furosemide (LASIX) 40 MG tablet Take 80 mg by mouth daily.    [name] gabapentin (NEURONTIN) 300 MG capsule Take 300mg  in AM and 600mg  in PM    . HYDROcodone-acetaminophen (NORCO/VICODIN) 5-325 MG per tablet Take 1 tablet by mouth every 6 (six) hours as needed for moderate pain.     [name] insulin aspart (NOVOLOG) 100 UNIT/ML injection Inject 10-30 Units into the skin 2 (two) times daily before a meal. Sliding scale    . insulin glargine (LANTUS) 100 UNIT/ML injection Inject 60 Units into the skin 2 (two) times daily.     [name] lisinopril (PRINIVIL,ZESTRIL) 10 MG tablet Take 10 mg by mouth daily.    . methotrexate (RHEUMATREX) 7.5 MG tablet Take 1 tablet (7.5 mg total) by mouth once a week. Caution" Chemotherapy. Protect from light. 4 tablet 1  . Multiple Vitamins-Minerals (CENTRUM ADULTS) TABS Take 1 tablet by mouth daily.     . potassium chloride (K-DUR,KLOR-CON) 10 MEQ tablet Take 1 tablet (10 mEq total) by mouth daily. 30 tablet 0  . predniSONE (DELTASONE) 10 MG tablet Take 3 tablets (30 mg total) by mouth daily with breakfast. 90 tablet 3  . ranitidine (ZANTAC) 300 MG tablet Take 300 mg by mouth daily.    . sitaGLIPtin (JANUVIA) 100 MG tablet Take 100 mg by mouth daily.      No current facility-administered medications on file prior to visit.

## 2014-07-10 ENCOUNTER — Ambulatory Visit: Payer: BC Managed Care – PPO | Admitting: Internal Medicine

## 2014-07-28 ENCOUNTER — Ambulatory Visit (INDEPENDENT_AMBULATORY_CARE_PROVIDER_SITE_OTHER): Payer: BC Managed Care – PPO | Admitting: Emergency Medicine

## 2014-07-28 ENCOUNTER — Other Ambulatory Visit (INDEPENDENT_AMBULATORY_CARE_PROVIDER_SITE_OTHER): Payer: BC Managed Care – PPO

## 2014-07-28 ENCOUNTER — Encounter: Payer: Self-pay | Admitting: Emergency Medicine

## 2014-07-28 VITALS — BP 126/62 | HR 91 | Ht 63.0 in | Wt 274.8 lb

## 2014-07-28 DIAGNOSIS — D860 Sarcoidosis of lung: Secondary | ICD-10-CM

## 2014-07-28 LAB — CBC
HCT: 30.1 % — ABNORMAL LOW (ref 36.0–46.0)
Hemoglobin: 10.1 g/dL — ABNORMAL LOW (ref 12.0–15.0)
MCHC: 33.7 g/dL (ref 30.0–36.0)
MCV: 91.9 fl (ref 78.0–100.0)
PLATELETS: 351 10*3/uL (ref 150.0–400.0)
RBC: 3.27 Mil/uL — AB (ref 3.87–5.11)
RDW: 17.1 % — ABNORMAL HIGH (ref 11.5–15.5)
WBC: 10.4 10*3/uL (ref 4.0–10.5)

## 2014-07-28 MED ORDER — METHOTREXATE SODIUM 5 MG PO TABS: 10.0000 mg | ORAL_TABLET | ORAL | Status: DC

## 2014-07-28 NOTE — Patient Instructions (Signed)
Please decreased your prednisone to 5 mg daily now We will increase her methotrexate to 10 mg weekly beginning next Monday. Our plan will be to increase to a goal of 12.5-15 mg a week over a few months.  After you have been on the new dose of methotrexate for one week (2 Mondays from now) please stop your prednisone Lab work today Follow with Dr Lamonte Sakai in 2 months or sooner if you have any problems.

## 2014-07-28 NOTE — Assessment & Plan Note (Signed)
Her infiltrates were improved in October by CT scan. We started methotrexate because she's had a terrible time tolerating prednisone. She would very much like to wean prednisone off completely as soon as possible. I believe that her hypoxemia and many of her breathing symptoms can now be ascribed to her weight gain and deconditioning. We will plan to up titrate her methotrexate, wean her prednisone off quickly and then consider timing of a repeat CT scan of the chest. She may also need an echocardiogram to assess for pulmonary hypertension. She also needs a referral to an ophthalmologist. I will figure out which ophthalmologist is best and then notify her

## 2014-07-28 NOTE — Progress Notes (Signed)
Subjective:    Patient ID: Marissa Wilkerson, female    DOB: 1961-01-27, 53 y.o.   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 I saw her initially in consultation after admission to [facility] for hypoxemia 09/02/13. She had experienced dyspnea that has been insidious over about 3-4 weeks. She denied FCS or purulent sputum production. No recent URI or unusual exposures or travels. She was treated with HCTZ in 9/'14 by Dr Delfina Redwood when she developed significant LE edema. TTE from 10/14 showed no evidence of PAH or LV dysfxn. Following her presentation to [facility] CT-PA revealed Abnormal lungs with numerous indistinct bilateral pulmonary nodules (semi-solid appearing), with superimposed confluent mass like pulmonary opacity in the left lower lung crossing the major fissure. There was no PE.  We performed serologies and FOB during the admission. She was discharged to home on O2 with plan to f/u today. She feels some dyspnea w exertion. Continues to desat with exertion on RA.   ROV 10/22/13 -- follows up for dyspnea, hypoxemia, nodular disease on her Ct chest. After negative culture data on FOB we initiated steroid trial, 40mg  daily. Returns today  > no changes in her breathing but side effects > edema, wt gain of 30 lbs. CBG's have been higher. Her lowest SpO2 with exertion on RA has been 82%.   ROV 10/30/13 -- dyspnea, hypoxemia, nodular disease on CT chest. FOB negative. Trial steroids did not change her SOB. Repeat Ct scan >>  2//25/15 . She has seen documented desats with exertion. She is still wearing O2 w exertion. No cough or wheeze. She does have low back pain.   ROV 12/31/13 -- follow up for dyspnea, hypoxemia, nodular disease on CT chest. FOB negative for organisms. Her repeat CT scan > large scale resolution of the pneumonitis and nodular disease with some residual micro-nodular disease. She has gained  40 lbs on the pred > now off. She has documented exertional desats to 80's on RA. She hasn't really benefited from albuterol. She is doing physical therapy 2x a week.   ROV 01/30/14 -- follow up for dyspnea, hypoxemia, nodular disease on CT chest. Repeat Ct scan showed significant improvement in her pneumonitis. She returns describing stable dyspnea. We did a trial of symbicort but she didn't benefit so stopped it. She is wearing 2L/min typically. She has lost 3 lbs since last time.  No cough, occasionally hears some wheeze. She is having GERD sx.   ROV 02/09/14 -- dyspnea, hypoxemia, nodular disease on CT chest. Repeat Ct scan 11/14/13 showed significant improvement in her pneumonitis. Her dyspnea has not really improved despite steroids and the improved CT. She did gain wt on pred, but hard to ascribe all her SOB to this. We repeated CT 02/06/14 as below. She has wheeze but only rarely.   ROV 06/09/14 -- follow up for steroid-responsive pneumonitis, VATS bx in July '15 consistent with sarcoidosis. She is on pred 30mg  qd and has gained 30 lbs. Her SOB is worse likely due to wt gain.   ROV 07/28/14 -- she is having terrible side effects on the prednisone, labile mood, sadness, wt gain. Down now to 10mg . MTX at 7.5mg . Breathing has been stable.     Review of Systems  Constitutional: Positive for activity change and unexpected weight change. Negative for fever.  HENT: Negative for congestion, dental problem, ear pain, nosebleeds, postnasal drip,  rhinorrhea, sinus pressure, sneezing, sore throat and trouble swallowing.   Eyes: Negative for redness and itching.  Respiratory: Positive for shortness of breath. Negative for cough, chest tightness and wheezing.   Cardiovascular: Positive for leg swelling. Negative for palpitations.  Gastrointestinal: Negative for nausea and vomiting.  Genitourinary: Negative for dysuria.  Musculoskeletal: Positive for back pain. Negative for joint swelling.  Skin: Negative  for rash.  Neurological: Negative for headaches.  Hematological: Does not bruise/bleed easily.  Psychiatric/Behavioral: Negative for dysphoric mood. The patient is not nervous/anxious.        Objective:   Physical Exam Filed Vitals:   07/28/14 1424  BP: 126/62  Pulse: 91  Height: 5\' 3"  (1.6 m)  Weight: 274 lb 12.8 oz (124.648 kg)  SpO2: 98%    Gen: Pleasant, overwt woman, in no distress,  normal affect  ENT: No lesions,  mouth clear,  oropharynx clear, no postnasal drip, evolving some cushingoid features  Neck: No JVD, no stridor  Lungs: No use of accessory muscles, clear without rales or rhonchi, no wheeze  Cardiovascular: RRR, heart sounds normal, no murmur or gallops, no peripheral edema  Musculoskeletal: No deformities, no cyanosis or clubbing  Neuro: alert, non focal  Skin: Warm, no lesions or rashes   Testing:  BAL fungal smear 1/8 >> negative, cx pending BAL AFB smear 1/8 >> negative, cx pending BAL cytology 1/8 >> negative Brushings cytology 1/8 >> negative TBBx's path 1/8 >> negative RF, ANA, ANCA, 1/8 >> all negative  IgE 1/7 >> 55 (normal)  Eosinophil count 1/7 >> 0.4 (normal)  Fungal hypersensitivity panel 1/7 >> all negative Quantiferon gold 1/7 >> negative HIV 1/7 >> negative   10/27/13 --  COMPARISON: CT ANGIO CHEST W/CM &/OR WO/CM dated 09/02/2013  FINDINGS:  Mediastinal lymph nodes are not enlarged by CT size criteria. Hilar  regions are difficult to definitively evaluate without IV contrast.  No axillary adenopathy. Heart is enlarged. No pericardial effusion.  Mild mosaic attenuation. Scattered linear scarring. Previously seen  areas of nodular and masslike airspace consolidation have resolved  in the interval. Scattered pulmonary nodules measure 4 mm or less in  size and are unchanged. No pleural fluid. Airway is unremarkable.  Incidental imaging of the upper abdomen shows the visualized  portions of the liver , adrenal glands, kidneys,  spleen, pancreas,  stomach and bowel to be grossly unremarkable. Cholecystectomy. No  upper abdominal adenopathy. No worrisome lytic or sclerotic lesions.  IMPRESSION:  1. Interval clearing of previously seen areas of nodular and  masslike airspace consolidation, most consistent with resolved  pneumonia.  2. Additional scattered pulmonary nodules measure 4 mm or less in  size and are unchanged. If the patient is at high risk for  bronchogenic carcinoma, follow-up chest CT at 1 year is recommended.  If the patient is at low risk, no follow-up is needed. This  recommendation follows the consensus statement: Guidelines for  Management of Small Pulmonary Nodules Detected on CT Scans: A  Statement from the [place] as published in Radiology  2005; 237:395-400.  3. Mild mosaic pulmonary parenchymal attenuation can be seen in the  setting of small airways disease      Assessment & Plan:  Sarcoidosis of lung Her infiltrates were improved in October by CT scan. We started methotrexate because she's had a terrible time tolerating prednisone. She would very much like to wean prednisone off completely as soon as possible. I believe that her hypoxemia and many of her breathing symptoms can  now be ascribed to her weight gain and deconditioning. We will plan to up titrate her methotrexate, wean her prednisone off quickly and then consider timing of a repeat CT scan of the chest. She may also need an echocardiogram to assess for pulmonary hypertension. She also needs a referral to an ophthalmologist. I will figure out which ophthalmologist is best and then notify her

## 2014-07-29 LAB — HEPATIC FUNCTION PANEL
ALK PHOS: 73 U/L (ref 39–117)
ALT: 45 U/L — AB (ref 0–35)
AST: 32 U/L (ref 0–37)
Albumin: 3.7 g/dL (ref 3.5–5.2)
BILIRUBIN DIRECT: 0.1 mg/dL (ref 0.0–0.3)
BILIRUBIN TOTAL: 0.7 mg/dL (ref 0.2–1.2)
Total Protein: 7.4 g/dL (ref 6.0–8.3)

## 2014-08-08 ENCOUNTER — Encounter: Payer: Self-pay | Admitting: Emergency Medicine

## 2014-08-24 ENCOUNTER — Encounter: Payer: Self-pay | Admitting: Emergency Medicine

## 2014-08-24 DIAGNOSIS — D860 Sarcoidosis of lung: Secondary | ICD-10-CM

## 2014-08-24 MED ORDER — FOLIC ACID 1 MG PO TABS: 1.0000 mg | ORAL_TABLET | Freq: Every day | ORAL | Status: DC

## 2014-09-11 ENCOUNTER — Other Ambulatory Visit (INDEPENDENT_AMBULATORY_CARE_PROVIDER_SITE_OTHER): Payer: Self-pay

## 2014-09-11 DIAGNOSIS — D860 Sarcoidosis of lung: Secondary | ICD-10-CM

## 2014-09-11 LAB — CBC WITH DIFFERENTIAL/PLATELET
Basophils Absolute: 0 10*3/uL (ref 0.0–0.1)
Basophils Relative: 0.3 % (ref 0.0–3.0)
EOS ABS: 0.4 10*3/uL (ref 0.0–0.7)
EOS PCT: 3.6 % (ref 0.0–5.0)
HCT: 31 % — ABNORMAL LOW (ref 36.0–46.0)
Hemoglobin: 10.3 g/dL — ABNORMAL LOW (ref 12.0–15.0)
Lymphocytes Relative: 13.1 % (ref 12.0–46.0)
Lymphs Abs: 1.3 10*3/uL (ref 0.7–4.0)
MCHC: 33.1 g/dL (ref 30.0–36.0)
MCV: 96.7 fl (ref 78.0–100.0)
MONOS PCT: 8.1 % (ref 3.0–12.0)
Monocytes Absolute: 0.8 10*3/uL (ref 0.1–1.0)
NEUTROS ABS: 7.7 10*3/uL (ref 1.4–7.7)
NEUTROS PCT: 74.9 % (ref 43.0–77.0)
PLATELETS: 373 10*3/uL (ref 150.0–400.0)
RBC: 3.21 Mil/uL — AB (ref 3.87–5.11)
RDW: 16.4 % — ABNORMAL HIGH (ref 11.5–15.5)
WBC: 10.2 10*3/uL (ref 4.0–10.5)

## 2014-09-14 ENCOUNTER — Encounter: Payer: Self-pay | Admitting: Emergency Medicine

## 2014-09-14 NOTE — Telephone Encounter (Signed)
Per pt email request:  He and I had discussed increasing my methotrexate to 12.5mg  if labs were okay. Need to know if I can increase dosage. Thanks   Please advise RB thanks

## 2014-10-06 ENCOUNTER — Encounter: Payer: Self-pay | Admitting: Emergency Medicine

## 2014-10-06 ENCOUNTER — Other Ambulatory Visit (INDEPENDENT_AMBULATORY_CARE_PROVIDER_SITE_OTHER): Payer: BLUE CROSS/BLUE SHIELD

## 2014-10-06 ENCOUNTER — Ambulatory Visit (INDEPENDENT_AMBULATORY_CARE_PROVIDER_SITE_OTHER): Payer: BLUE CROSS/BLUE SHIELD | Admitting: Emergency Medicine

## 2014-10-06 VITALS — BP 128/60 | HR 89 | Temp 98.4°F | Ht 63.0 in | Wt 269.0 lb

## 2014-10-06 DIAGNOSIS — D860 Sarcoidosis of lung: Secondary | ICD-10-CM

## 2014-10-06 LAB — CBC
HEMATOCRIT: 29.7 % — AB (ref 36.0–46.0)
Hemoglobin: 10.2 g/dL — ABNORMAL LOW (ref 12.0–15.0)
MCHC: 34.2 g/dL (ref 30.0–36.0)
MCV: 94.2 fl (ref 78.0–100.0)
PLATELETS: 352 10*3/uL (ref 150.0–400.0)
RBC: 3.15 Mil/uL — AB (ref 3.87–5.11)
RDW: 16.3 % — ABNORMAL HIGH (ref 11.5–15.5)
WBC: 7.3 10*3/uL (ref 4.0–10.5)

## 2014-10-06 LAB — HEPATIC FUNCTION PANEL
ALT: 36 U/L — AB (ref 0–35)
AST: 31 U/L (ref 0–37)
Albumin: 3.7 g/dL (ref 3.5–5.2)
Alkaline Phosphatase: 120 U/L — ABNORMAL HIGH (ref 39–117)
BILIRUBIN DIRECT: 0.1 mg/dL (ref 0.0–0.3)
Total Bilirubin: 0.4 mg/dL (ref 0.2–1.2)
Total Protein: 7.3 g/dL (ref 6.0–8.3)

## 2014-10-06 NOTE — Patient Instructions (Signed)
We will perform lab work today and you will be called with the results We will plan to increase your methotrexate to 15 mg weekly if your lab work is acceptable Please slowly increase your exercise Wear oxygen with exertion Follow with Dr Lamonte Sakai in 2 months or sooner if you have any problems.

## 2014-10-06 NOTE — Assessment & Plan Note (Signed)
We will perform lab work today and you will be called with the results We will plan to increase your methotrexate to 15 mg weekly if your lab work is acceptable Please slowly increase your exercise Wear oxygen with exertion Follow with Dr Lamonte Sakai in 2 months or sooner if you have any problems.

## 2014-10-06 NOTE — Progress Notes (Signed)
Subjective:    Patient ID: Marissa Wilkerson, female    DOB: December 24, 1960, 54 y.o.   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 I saw her initially in consultation after admission to [facility] for hypoxemia 09/02/13. She had experienced dyspnea that has been insidious over about 3-4 weeks. She denied FCS or purulent sputum production. No recent URI or unusual exposures or travels. She was treated with HCTZ in 9/'14 by Dr Delfina Redwood when she developed significant LE edema. TTE from 10/14 showed no evidence of PAH or LV dysfxn. Following her presentation to [facility] CT-PA revealed Abnormal lungs with numerous indistinct bilateral pulmonary nodules (semi-solid appearing), with superimposed confluent mass like pulmonary opacity in the left lower lung crossing the major fissure. There was no PE.  We performed serologies and FOB during the admission. She was discharged to home on O2 with plan to f/u today. She feels some dyspnea w exertion. Continues to desat with exertion on RA.   ROV 10/22/13 -- follows up for dyspnea, hypoxemia, nodular disease on her Ct chest. After negative culture data on FOB we initiated steroid trial, 40mg  daily. Returns today  > no changes in her breathing but side effects > edema, wt gain of 30 lbs. CBG's have been higher. Her lowest SpO2 with exertion on RA has been 82%.   ROV 10/30/13 -- dyspnea, hypoxemia, nodular disease on CT chest. FOB negative. Trial steroids did not change her SOB. Repeat Ct scan >>  2//25/15 . She has seen documented desats with exertion. She is still wearing O2 w exertion. No cough or wheeze. She does have low back pain.   ROV 12/31/13 -- follow up for dyspnea, hypoxemia, nodular disease on CT chest. FOB negative for organisms. Her repeat CT scan > large scale resolution of the pneumonitis and nodular disease with some residual micro-nodular disease. She has gained  40 lbs on the pred > now off. She has documented exertional desats to 80's on RA. She hasn't really benefited from albuterol. She is doing physical therapy 2x a week.   ROV 01/30/14 -- follow up for dyspnea, hypoxemia, nodular disease on CT chest. Repeat Ct scan showed significant improvement in her pneumonitis. She returns describing stable dyspnea. We did a trial of symbicort but she didn't benefit so stopped it. She is wearing 2L/min typically. She has lost 3 lbs since last time.  No cough, occasionally hears some wheeze. She is having GERD sx.   ROV 02/09/14 -- dyspnea, hypoxemia, nodular disease on CT chest. Repeat Ct scan 11/14/13 showed significant improvement in her pneumonitis. Her dyspnea has not really improved despite steroids and the improved CT. She did gain wt on pred, but hard to ascribe all her SOB to this. We repeated CT 02/06/14 as below. She has wheeze but only rarely.   ROV 06/09/14 -- follow up for steroid-responsive pneumonitis, VATS bx in July '15 consistent with sarcoidosis. She is on pred 30mg  qd and has gained 30 lbs. Her SOB is worse likely due to wt gain.   ROV 07/28/14 -- she is having terrible side effects on the prednisone, labile mood, sadness, wt gain. Down now to 10mg . MTX at 7.5mg . Breathing has been stable.   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 We have been transitioning prednisone to methotrexate. She is now off prednisone  for the last 7 weeks. She feels fewer side effects, same breathing.     Review of Systems  Constitutional: Positive for activity change and unexpected weight change. Negative for fever.  HENT: Negative for congestion, dental problem, ear pain, nosebleeds, postnasal drip, rhinorrhea, sinus pressure, sneezing, sore throat and trouble swallowing.   Eyes: Negative for redness and itching.  Respiratory: Positive for shortness of breath. Negative for cough,  chest tightness and wheezing.   Cardiovascular: Positive for leg swelling. Negative for palpitations.  Gastrointestinal: Negative for nausea and vomiting.  Genitourinary: Negative for dysuria.  Musculoskeletal: Positive for back pain. Negative for joint swelling.  Skin: Negative for rash.  Neurological: Negative for headaches.  Hematological: Does not bruise/bleed easily.  Psychiatric/Behavioral: Negative for dysphoric mood. The patient is not nervous/anxious.        Objective:   Physical Exam Filed Vitals:   10/06/14 1041  BP: 128/60  Pulse: 89  Temp: 98.4 F (36.9 C)  TempSrc: Oral  Height: 5\' 3"  (1.6 m)  Weight: 269 lb (122.018 kg)  SpO2: 96%    Gen: Pleasant, overwt woman, in no distress,  normal affect  ENT: No lesions,  mouth clear,  oropharynx clear, no postnasal drip, evolving some cushingoid features  Neck: No JVD, no stridor  Lungs: No use of accessory muscles, clear without rales or rhonchi, no wheeze  Cardiovascular: RRR, heart sounds normal, no murmur or gallops, no peripheral edema  Musculoskeletal: No deformities, no cyanosis or clubbing  Neuro: alert, non focal  Skin: Warm, no lesions or rashes   Testing:  BAL fungal smear 1/8 >> negative, cx pending BAL AFB smear 1/8 >> negative, cx pending BAL cytology 1/8 >> negative Brushings cytology 1/8 >> negative TBBx's path 1/8 >> negative RF, ANA, ANCA, 1/8 >> all negative  IgE 1/7 >> 55 (normal)  Eosinophil count 1/7 >> 0.4 (normal)  Fungal hypersensitivity panel 1/7 >> all negative Quantiferon gold 1/7 >> negative HIV 1/7 >> negative   10/27/13 --  COMPARISON: CT ANGIO CHEST W/CM &/OR WO/CM dated 09/02/2013  FINDINGS:  Mediastinal lymph nodes are not enlarged by CT size criteria. Hilar  regions are difficult to definitively evaluate without IV contrast.  No axillary adenopathy. Heart is enlarged. No pericardial effusion.  Mild mosaic attenuation. Scattered linear scarring. Previously seen  areas  of nodular and masslike airspace consolidation have resolved  in the interval. Scattered pulmonary nodules measure 4 mm or less in  size and are unchanged. No pleural fluid. Airway is unremarkable.  Incidental imaging of the upper abdomen shows the visualized  portions of the liver , adrenal glands, kidneys, spleen, pancreas,  stomach and bowel to be grossly unremarkable. Cholecystectomy. No  upper abdominal adenopathy. No worrisome lytic or sclerotic lesions.  IMPRESSION:  1. Interval clearing of previously seen areas of nodular and  masslike airspace consolidation, most consistent with resolved  pneumonia.  2. Additional scattered pulmonary nodules measure 4 mm or less in  size and are unchanged. If the patient is at high risk for  bronchogenic carcinoma, follow-up chest CT at 1 year is recommended.  If the patient is at low risk, no follow-up is needed. This  recommendation follows the consensus statement: Guidelines for  Management of Small Pulmonary Nodules Detected on CT Scans: A  Statement from the [employer] as published in Radiology  2005; 237:395-400.  3. Mild mosaic pulmonary parenchymal attenuation can be seen in the  setting of small airways disease      Assessment &  Plan:  Sarcoidosis of lung We will perform lab work today and you will be called with the results We will plan to increase your methotrexate to 15 mg weekly if your lab work is acceptable Please slowly increase your exercise Wear oxygen with exertion Follow with Dr Lamonte Sakai in 2 months or sooner if you have any problems.

## 2014-10-09 ENCOUNTER — Encounter: Payer: Self-pay | Admitting: Emergency Medicine

## 2014-10-09 DIAGNOSIS — D860 Sarcoidosis of lung: Secondary | ICD-10-CM

## 2014-10-09 NOTE — Telephone Encounter (Signed)
RB can you take a look at the pt's lab results. She is requesting them. Thanks!

## 2014-10-12 MED ORDER — FOLIC ACID 1 MG PO TABS: 1.0000 mg | ORAL_TABLET | Freq: Every day | ORAL | Status: DC

## 2014-10-12 MED ORDER — METHOTREXATE SODIUM 5 MG PO TABS: 10.0000 mg | ORAL_TABLET | ORAL | Status: DC

## 2014-10-12 NOTE — Telephone Encounter (Signed)
Pt called for refill of Methotrexate and Folic Acid. These have been sent to [facility]. Pt aware that Methotrexate dose is remaining the same (5mg  - 10mg  weekly) until Dr Lamonte Sakai lets up know further.   Will send to Dr Lamonte Sakai to advise on lab results.

## 2014-10-14 NOTE — Telephone Encounter (Signed)
Labs reviewed; we can go ahead and increase the MTX to 15mg  once a week She needs to have the labs repeated on the same monthly schedule.

## 2014-10-15 MED ORDER — METHOTREXATE SODIUM 5 MG PO TABS: 15.0000 mg | ORAL_TABLET | ORAL | Status: DC

## 2014-10-15 NOTE — Addendum Note (Signed)
Addended by: Desmond Dike C on: 10/15/2014 10:28 AM   Modules accepted: Orders

## 2014-11-10 ENCOUNTER — Encounter: Payer: Self-pay | Admitting: Emergency Medicine

## 2014-11-10 DIAGNOSIS — D869 Sarcoidosis, unspecified: Secondary | ICD-10-CM

## 2014-11-10 NOTE — Telephone Encounter (Signed)
RB Pt  Sent an email needing an rx for the wheelchair and will need a letter to take with her to the doctor for the disability appt.  Please advise. thanks

## 2014-11-13 ENCOUNTER — Encounter: Payer: Self-pay | Admitting: *Deleted

## 2014-11-13 NOTE — Telephone Encounter (Signed)
Will prepare letter detailing pt's lung disease Ok to write script for her wheelchair

## 2014-12-23 ENCOUNTER — Telehealth: Payer: Self-pay | Admitting: Emergency Medicine

## 2014-12-23 NOTE — Telephone Encounter (Signed)
Rec'd from [facility] forward 8 pages to Dr. Lamonte Sakai

## 2014-12-24 ENCOUNTER — Encounter: Payer: Self-pay | Admitting: Emergency Medicine

## 2014-12-24 ENCOUNTER — Ambulatory Visit (INDEPENDENT_AMBULATORY_CARE_PROVIDER_SITE_OTHER): Payer: BLUE CROSS/BLUE SHIELD | Admitting: Emergency Medicine

## 2014-12-24 ENCOUNTER — Other Ambulatory Visit (INDEPENDENT_AMBULATORY_CARE_PROVIDER_SITE_OTHER): Payer: BLUE CROSS/BLUE SHIELD

## 2014-12-24 VITALS — BP 98/52 | HR 87 | Ht 63.0 in | Wt 248.0 lb

## 2014-12-24 DIAGNOSIS — D860 Sarcoidosis of lung: Secondary | ICD-10-CM

## 2014-12-24 LAB — CBC WITH DIFFERENTIAL/PLATELET
BASOS PCT: 0.3 % (ref 0.0–3.0)
Basophils Absolute: 0 10*3/uL (ref 0.0–0.1)
EOS ABS: 0.4 10*3/uL (ref 0.0–0.7)
Eosinophils Relative: 3.4 % (ref 0.0–5.0)
HCT: 29.8 % — ABNORMAL LOW (ref 36.0–46.0)
Hemoglobin: 10.3 g/dL — ABNORMAL LOW (ref 12.0–15.0)
LYMPHS PCT: 11.1 % — AB (ref 12.0–46.0)
Lymphs Abs: 1.2 10*3/uL (ref 0.7–4.0)
MCHC: 34.5 g/dL (ref 30.0–36.0)
MCV: 94.9 fl (ref 78.0–100.0)
MONOS PCT: 5.9 % (ref 3.0–12.0)
Monocytes Absolute: 0.7 10*3/uL (ref 0.1–1.0)
NEUTROS PCT: 79.3 % — AB (ref 43.0–77.0)
Neutro Abs: 8.8 10*3/uL — ABNORMAL HIGH (ref 1.4–7.7)
Platelets: 364 10*3/uL (ref 150.0–400.0)
RBC: 3.14 Mil/uL — ABNORMAL LOW (ref 3.87–5.11)
RDW: 16 % — ABNORMAL HIGH (ref 11.5–15.5)
WBC: 11.1 10*3/uL — ABNORMAL HIGH (ref 4.0–10.5)

## 2014-12-24 LAB — HEPATIC FUNCTION PANEL
ALT: 33 U/L (ref 0–35)
AST: 24 U/L (ref 0–37)
Albumin: 3.9 g/dL (ref 3.5–5.2)
Alkaline Phosphatase: 113 U/L (ref 39–117)
Bilirubin, Direct: 0.1 mg/dL (ref 0.0–0.3)
TOTAL PROTEIN: 7.3 g/dL (ref 6.0–8.3)
Total Bilirubin: 0.4 mg/dL (ref 0.2–1.2)

## 2014-12-24 MED ORDER — METHOTREXATE SODIUM 5 MG PO TABS: 15.0000 mg | ORAL_TABLET | ORAL | Status: DC

## 2014-12-24 MED ORDER — FOLIC ACID 1 MG PO TABS: 1.0000 mg | ORAL_TABLET | Freq: Every day | ORAL | Status: DC

## 2014-12-24 NOTE — Progress Notes (Signed)
qd  Subjective:    Patient ID: Marissa Wilkerson, female    DOB: [birth year], 54 y.o.   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 I saw her initially in consultation after admission to [facility] for hypoxemia 09/02/13. She had experienced dyspnea that has been insidious over about 3-4 weeks. She denied FCS or purulent sputum production. No recent URI or unusual exposures or travels. She was treated with HCTZ in 9/'14 by Dr Delfina Redwood when she developed significant LE edema. TTE from 10/14 showed no evidence of PAH or LV dysfxn. Following her presentation to [facility] CT-PA revealed Abnormal lungs with numerous indistinct bilateral pulmonary nodules (semi-solid appearing), with superimposed confluent mass like pulmonary opacity in the left lower lung crossing the major fissure. There was no PE.  We performed serologies and FOB during the admission. She was discharged to home on O2 with plan to f/u today. She feels some dyspnea w exertion. Continues to desat with exertion on RA.   ROV 10/22/13 -- follows up for dyspnea, hypoxemia, nodular disease on her Ct chest. After negative culture data on FOB we initiated steroid trial, 40mg  daily. Returns today  > no changes in her breathing but side effects > edema, wt gain of 30 lbs. CBG's have been higher. Her lowest SpO2 with exertion on RA has been 82%.   ROV 10/30/13 -- dyspnea, hypoxemia, nodular disease on CT chest. FOB negative. Trial steroids did not change her SOB. Repeat Ct scan >>  2//25/15 . She has seen documented desats with exertion. She is still wearing O2 w exertion. No cough or wheeze. She does have low back pain.   ROV 12/31/13 -- follow up for dyspnea, hypoxemia, nodular disease on CT chest. FOB negative for organisms. Her repeat CT scan > large scale resolution of the pneumonitis and nodular disease with some residual micro-nodular disease. She has  gained 40 lbs on the pred > now off. She has documented exertional desats to 80's on RA. She hasn't really benefited from albuterol. She is doing physical therapy 2x a week.   ROV 01/30/14 -- follow up for dyspnea, hypoxemia, nodular disease on CT chest. Repeat Ct scan showed significant improvement in her pneumonitis. She returns describing stable dyspnea. We did a trial of symbicort but she didn't benefit so stopped it. She is wearing 2L/min typically. She has lost 3 lbs since last time.  No cough, occasionally hears some wheeze. She is having GERD sx.   ROV 02/09/14 -- dyspnea, hypoxemia, nodular disease on CT chest. Repeat Ct scan 11/14/13 showed significant improvement in her pneumonitis. Her dyspnea has not really improved despite steroids and the improved CT. She did gain wt on pred, but hard to ascribe all her SOB to this. We repeated CT 02/06/14 as below. She has wheeze but only rarely.   ROV 06/09/14 -- follow up for steroid-responsive pneumonitis, VATS bx in July '15 consistent with sarcoidosis. She is on pred 30mg  qd and has gained 30 lbs. Her SOB is worse likely due to wt gain.   ROV 07/28/14 -- she is having terrible side effects on the prednisone, labile mood, sadness, wt gain. Down now to 10mg . MTX at 7.5mg . Breathing has been stable.   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 We have been transitioning prednisone to methotrexate. She is now off  prednisone for the last 7 weeks. She feels fewer side effects, same breathing.   ROV 4 /28/16 -- follow-up visit for sarcoidosis status post biopsy. She is on methotrexate, her current dose is 15mg  x 3 months. She is off pred and has lost 30 lbs. She is supposed to start aquatic therapy soon.  She notes that she has some desats w exertion on 3L/min to the 80s percent.     Review of Systems  Constitutional: Positive for activity change and unexpected weight  change. Negative for fever.  HENT: Negative for congestion, dental problem, ear pain, nosebleeds, postnasal drip, rhinorrhea, sinus pressure, sneezing, sore throat and trouble swallowing.   Eyes: Negative for redness and itching.  Respiratory: Positive for shortness of breath. Negative for cough, chest tightness and wheezing.   Cardiovascular: Positive for leg swelling. Negative for palpitations.  Gastrointestinal: Negative for nausea and vomiting.  Genitourinary: Negative for dysuria.  Musculoskeletal: Positive for back pain. Negative for joint swelling.  Skin: Negative for rash.  Neurological: Negative for headaches.  Hematological: Does not bruise/bleed easily.  Psychiatric/Behavioral: Negative for dysphoric mood. The patient is not nervous/anxious.        Objective:   Physical Exam Filed Vitals:   12/24/14 0900  BP: 98/52  Pulse: 87  Height: 5\' 3"  (1.6 m)  Weight: 248 lb (112.492 kg)  SpO2: 96%     Gen: Pleasant, overwt woman, in no distress,  normal affect  ENT: No lesions,  mouth clear,  oropharynx clear, no postnasal drip, evolving some cushingoid features  Neck: No JVD, no stridor  Lungs: No use of accessory muscles, clear without rales or rhonchi, no wheeze  Cardiovascular: RRR, early systolic murmur best heard at the right sternal border no peripheral edema  Musculoskeletal: No deformities, no cyanosis or clubbing  Neuro: alert, non focal  Skin: Warm, no lesions or rashes   Testing:  BAL fungal smear 1/8 >> negative, cx pending BAL AFB smear 1/8 >> negative, cx pending BAL cytology 1/8 >> negative Brushings cytology 1/8 >> negative TBBx's path 1/8 >> negative RF, ANA, ANCA, 1/8 >> all negative  IgE 1/7 >> 55 (normal)  Eosinophil count 1/7 >> 0.4 (normal)  Fungal hypersensitivity panel 1/7 >> all negative Quantiferon gold 1/7 >> negative HIV 1/7 >> negative   10/27/13 --  COMPARISON: CT ANGIO CHEST W/CM &/OR WO/CM dated 09/02/2013  FINDINGS:   Mediastinal lymph nodes are not enlarged by CT size criteria. Hilar  regions are difficult to definitively evaluate without IV contrast.  No axillary adenopathy. Heart is enlarged. No pericardial effusion.  Mild mosaic attenuation. Scattered linear scarring. Previously seen  areas of nodular and masslike airspace consolidation have resolved  in the interval. Scattered pulmonary nodules measure 4 mm or less in  size and are unchanged. No pleural fluid. Airway is unremarkable.  Incidental imaging of the upper abdomen shows the visualized  portions of the liver , adrenal glands, kidneys, spleen, pancreas,  stomach and bowel to be grossly unremarkable. Cholecystectomy. No  upper abdominal adenopathy. No worrisome lytic or sclerotic lesions.  IMPRESSION:  1. Interval clearing of previously seen areas of nodular and  masslike airspace consolidation, most consistent with resolved  pneumonia.  2. Additional scattered pulmonary nodules measure 4 mm or less in  size and are unchanged. If the patient is at high risk for  bronchogenic carcinoma, follow-up chest CT at 1 year is recommended.  If the patient is at low risk, no follow-up is needed. This  recommendation follows  the consensus statement: Guidelines for  Management of Small Pulmonary Nodules Detected on CT Scans: A  Statement from the [employer] as published in Radiology  2005; 237:395-400.  3. Mild mosaic pulmonary parenchymal attenuation can be seen in the  setting of small airways disease      Assessment & Plan:  Sarcoidosis of lung With presence of her murmur I would like to rule out and evaluate for secondary pulmonary hypertension. We'll continue her current methotrexate, up titrate her oxygen. We will check labs today and a chest x-ray at the next visit  Please continue methotrexate as you have been taking it We'll check blood work today Please increase her oxygen to 4 L/m pulse when you are exerting herself. Our goal  be to keep your SpO2 greater than 90% We will check an echocardiogram We'll repeat your chest x-ray at our next visit Follow with Dr. Lamonte Sakai in 3 months

## 2014-12-24 NOTE — Patient Instructions (Signed)
Please continue methotrexate as you have been taking it We'll check blood work today Please increase her oxygen to 4 L/m pulse when you are exerting herself. Our goal be to keep your SpO2 greater than 90% We will check an echocardiogram We'll repeat your chest x-ray at our next visit Follow with Dr. Lamonte Sakai in 3 months

## 2014-12-24 NOTE — Assessment & Plan Note (Addendum)
With presence of her murmur I would like to rule out and evaluate for secondary pulmonary hypertension. We'll continue her current methotrexate, up titrate her oxygen. We will check labs today and a chest x-ray at the next visit  Please continue methotrexate as you have been taking it We'll check blood work today Please increase her oxygen to 4 L/m pulse when you are exerting herself. Our goal be to keep your SpO2 greater than 90% We will check an echocardiogram We'll repeat your chest x-ray at our next visit Follow with Dr. Lamonte Sakai in 3 months

## 2014-12-29 ENCOUNTER — Other Ambulatory Visit (HOSPITAL_COMMUNITY): Payer: BLUE CROSS/BLUE SHIELD

## 2015-01-04 ENCOUNTER — Encounter: Payer: Self-pay | Admitting: Emergency Medicine

## 2015-01-04 ENCOUNTER — Encounter: Payer: Self-pay | Admitting: *Deleted

## 2015-01-05 NOTE — Telephone Encounter (Signed)
There were no other comments for this patient.

## 2015-01-05 NOTE — Telephone Encounter (Signed)
Ria Comment, were there any other concerns/comments for patient? If not we can just close this message out. Thanks.

## 2015-01-06 ENCOUNTER — Other Ambulatory Visit (HOSPITAL_COMMUNITY): Payer: BLUE CROSS/BLUE SHIELD

## 2015-01-12 ENCOUNTER — Other Ambulatory Visit (HOSPITAL_COMMUNITY): Payer: BLUE CROSS/BLUE SHIELD

## 2015-01-21 ENCOUNTER — Ambulatory Visit (HOSPITAL_COMMUNITY): Payer: BLUE CROSS/BLUE SHIELD | Attending: Internal Medicine

## 2015-01-21 ENCOUNTER — Other Ambulatory Visit: Payer: Self-pay

## 2015-01-21 DIAGNOSIS — D860 Sarcoidosis of lung: Secondary | ICD-10-CM

## 2015-01-21 DIAGNOSIS — D869 Sarcoidosis, unspecified: Secondary | ICD-10-CM | POA: Insufficient documentation

## 2015-02-22 ENCOUNTER — Other Ambulatory Visit: Payer: Self-pay

## 2015-02-26 ENCOUNTER — Ambulatory Visit
Admission: RE | Admit: 2015-02-26 | Discharge: 2015-02-26 | Disposition: A | Discharge status: Home / Self Care | Payer: BLUE CROSS/BLUE SHIELD | Source: Ambulatory Visit | Attending: Internal Medicine | Admitting: Internal Medicine

## 2015-02-26 ENCOUNTER — Other Ambulatory Visit: Payer: Self-pay | Admitting: Internal Medicine

## 2015-02-26 DIAGNOSIS — R112 Nausea with vomiting, unspecified: Secondary | ICD-10-CM

## 2015-02-26 DIAGNOSIS — R110 Nausea: Secondary | ICD-10-CM

## 2015-03-05 ENCOUNTER — Other Ambulatory Visit (HOSPITAL_COMMUNITY): Payer: Self-pay | Admitting: Internal Medicine

## 2015-03-05 DIAGNOSIS — R112 Nausea with vomiting, unspecified: Secondary | ICD-10-CM

## 2015-03-18 ENCOUNTER — Encounter: Payer: Self-pay | Admitting: Emergency Medicine

## 2015-03-19 ENCOUNTER — Other Ambulatory Visit: Payer: Self-pay | Admitting: *Deleted

## 2015-03-19 DIAGNOSIS — D860 Sarcoidosis of lung: Secondary | ICD-10-CM

## 2015-03-19 MED ORDER — FOLIC ACID 1 MG PO TABS: 1.0000 mg | ORAL_TABLET | Freq: Every day | ORAL | Status: DC

## 2015-03-19 MED ORDER — METHOTREXATE SODIUM 5 MG PO TABS: 15.0000 mg | ORAL_TABLET | ORAL | Status: DC

## 2015-03-25 ENCOUNTER — Encounter (HOSPITAL_COMMUNITY)
Admission: RE | Admit: 2015-03-25 | Discharge: 2015-03-25 | Disposition: A | Discharge status: Home / Self Care | Payer: BLUE CROSS/BLUE SHIELD | Source: Ambulatory Visit | Attending: Internal Medicine | Admitting: Internal Medicine

## 2015-03-25 DIAGNOSIS — R112 Nausea with vomiting, unspecified: Secondary | ICD-10-CM | POA: Diagnosis not present

## 2015-03-25 MED ORDER — TECHNETIUM TC 99M SULFUR COLLOID: 2.0000 | Freq: Once | INTRAVENOUS | Status: AC | PRN

## 2015-04-26 ENCOUNTER — Encounter: Payer: Self-pay | Admitting: Emergency Medicine

## 2015-04-26 ENCOUNTER — Other Ambulatory Visit: Payer: Self-pay | Admitting: Emergency Medicine

## 2015-05-04 ENCOUNTER — Ambulatory Visit (INDEPENDENT_AMBULATORY_CARE_PROVIDER_SITE_OTHER)
Admission: RE | Admit: 2015-05-04 | Discharge: 2015-05-04 | Disposition: A | Discharge status: Home / Self Care | Payer: BLUE CROSS/BLUE SHIELD | Source: Ambulatory Visit | Attending: Emergency Medicine | Admitting: Emergency Medicine

## 2015-05-04 ENCOUNTER — Ambulatory Visit (INDEPENDENT_AMBULATORY_CARE_PROVIDER_SITE_OTHER): Payer: BLUE CROSS/BLUE SHIELD | Admitting: Emergency Medicine

## 2015-05-04 ENCOUNTER — Encounter: Payer: Self-pay | Admitting: Emergency Medicine

## 2015-05-04 ENCOUNTER — Other Ambulatory Visit (INDEPENDENT_AMBULATORY_CARE_PROVIDER_SITE_OTHER): Payer: BLUE CROSS/BLUE SHIELD

## 2015-05-04 VITALS — BP 110/62 | HR 76 | Ht 63.0 in | Wt 237.0 lb

## 2015-05-04 DIAGNOSIS — J9610 Chronic respiratory failure, unspecified whether with hypoxia or hypercapnia: Secondary | ICD-10-CM | POA: Diagnosis not present

## 2015-05-04 DIAGNOSIS — D860 Sarcoidosis of lung: Secondary | ICD-10-CM

## 2015-05-04 LAB — CBC
HCT: 26 % — ABNORMAL LOW (ref 36.0–46.0)
MCHC: 33.8 g/dL (ref 30.0–36.0)
MCV: 97.3 fl (ref 78.0–100.0)
Platelets: 255 10*3/uL (ref 150.0–400.0)
RBC: 2.68 Mil/uL — ABNORMAL LOW (ref 3.87–5.11)
RDW: 17.3 % — AB (ref 11.5–15.5)
WBC: 10.7 10*3/uL — ABNORMAL HIGH (ref 4.0–10.5)

## 2015-05-04 LAB — HEPATIC FUNCTION PANEL
ALBUMIN: 3.7 g/dL (ref 3.5–5.2)
ALK PHOS: 81 U/L (ref 39–117)
ALT: 54 U/L — ABNORMAL HIGH (ref 0–35)
AST: 27 U/L (ref 0–37)
Bilirubin, Direct: 0 mg/dL (ref 0.0–0.3)
Total Bilirubin: 0.4 mg/dL (ref 0.2–1.2)
Total Protein: 6.8 g/dL (ref 6.0–8.3)

## 2015-05-04 NOTE — Assessment & Plan Note (Signed)
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 Continue current O2.

## 2015-05-04 NOTE — Progress Notes (Signed)
qd  Subjective:    Patient ID: Marissa Wilkerson, female    DOB: [birth year], 54 y.o.   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 I saw her initially in consultation after admission to [facility] for hypoxemia 09/02/13. She had experienced dyspnea that has been insidious over about 3-4 weeks. She denied FCS or purulent sputum production. No recent URI or unusual exposures or travels. She was treated with HCTZ in 9/'14 by Dr Delfina Redwood when she developed significant LE edema. TTE from 10/14 showed no evidence of PAH or LV dysfxn. Following her presentation to [facility] CT-PA revealed Abnormal lungs with numerous indistinct bilateral pulmonary nodules (semi-solid appearing), with superimposed confluent mass like pulmonary opacity in the left lower lung crossing the major fissure. There was no PE.  We performed serologies and FOB during the admission. She was discharged to home on O2 with plan to f/u today. She feels some dyspnea w exertion. Continues to desat with exertion on RA.   ROV 10/22/13 -- follows up for dyspnea, hypoxemia, nodular disease on her Ct chest. After negative culture data on FOB we initiated steroid trial, 40mg  daily. Returns today  > no changes in her breathing but side effects > edema, wt gain of 30 lbs. CBG's have been higher. Her lowest SpO2 with exertion on RA has been 82%.   ROV 10/30/13 -- dyspnea, hypoxemia, nodular disease on CT chest. FOB negative. Trial steroids did not change her SOB. Repeat Ct scan >>  2//25/15 . She has seen documented desats with exertion. She is still wearing O2 w exertion. No cough or wheeze. She does have low back pain.   ROV 12/31/13 -- follow up for dyspnea, hypoxemia, nodular disease on CT chest. FOB negative for organisms. Her repeat CT scan > large scale resolution of the pneumonitis and nodular disease with some residual micro-nodular disease. She has  gained 40 lbs on the pred > now off. She has documented exertional desats to 80's on RA. She hasn't really benefited from albuterol. She is doing physical therapy 2x a week.   ROV 01/30/14 -- follow up for dyspnea, hypoxemia, nodular disease on CT chest. Repeat Ct scan showed significant improvement in her pneumonitis. She returns describing stable dyspnea. We did a trial of symbicort but she didn't benefit so stopped it. She is wearing 2L/min typically. She has lost 3 lbs since last time.  No cough, occasionally hears some wheeze. She is having GERD sx.   ROV 02/09/14 -- dyspnea, hypoxemia, nodular disease on CT chest. Repeat Ct scan 11/14/13 showed significant improvement in her pneumonitis. Her dyspnea has not really improved despite steroids and the improved CT. She did gain wt on pred, but hard to ascribe all her SOB to this. We repeated CT 02/06/14 as below. She has wheeze but only rarely.   ROV 06/09/14 -- follow up for steroid-responsive pneumonitis, VATS bx in July '15 consistent with sarcoidosis. She is on pred 30mg  qd and has gained 30 lbs. Her SOB is worse likely due to wt gain.   ROV 07/28/14 -- she is having terrible side effects on the prednisone, labile mood, sadness, wt gain. Down now to 10mg . MTX at 7.5mg . Breathing has been stable.   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 We have been transitioning prednisone to methotrexate. She is now off  prednisone for the last 7 weeks. She feels fewer side effects, same breathing.   ROV 4 /28/16 -- follow-up visit for sarcoidosis status post biopsy. She is on methotrexate, her current dose is 15mg  x 3 months. She is off pred and has lost 30 lbs. She is supposed to start aquatic therapy soon.  She notes that she has some desats w exertion on 3L/min to the 80s percent.   ROV 05/04/15 -- follow-up visit for history of biopsy-proven sarcoidosis. Her most recent CT  scan of the chest was October 2015 that showed improved chronic granulomatous disease. She is being managed on methotrexate. Her last labs were at Dr Polite's office but her last LFT were done with me. Her breathing is stable. She has noticed some nose bleeding over the last few week. She has also been having some emesis and nausea. She has gastroparesis on an emptying scan.  She has lost 10 more pounds since last time.    Review of Systems  Constitutional: Negative for fever, activity change and unexpected weight change.  HENT: Positive for nosebleeds. Negative for congestion, dental problem, ear pain, postnasal drip, rhinorrhea, sinus pressure, sneezing, sore throat and trouble swallowing.   Eyes: Negative for redness and itching.  Respiratory: Positive for shortness of breath. Negative for cough, chest tightness and wheezing.   Cardiovascular: Positive for leg swelling. Negative for palpitations.  Gastrointestinal: Positive for nausea and vomiting.  Genitourinary: Negative for dysuria.  Musculoskeletal: Negative for back pain and joint swelling.  Skin: Negative for rash.  Neurological: Negative for headaches.  Hematological: Does not bruise/bleed easily.  Psychiatric/Behavioral: Negative for dysphoric mood. The patient is not nervous/anxious.        Objective:   Physical Exam Filed Vitals:   05/04/15 1132  BP: 110/62  Pulse: 76  Height: 5\' 3"  (1.6 m)  Weight: 237 lb (107.502 kg)  SpO2: 95%     Gen: Pleasant, overwt woman, in no distress,  normal affect  ENT: No lesions,  mouth clear,  oropharynx clear, no postnasal drip, evolving some cushingoid features  Neck: No JVD, no stridor  Lungs: No use of accessory muscles, clear without rales or rhonchi, no wheeze  Cardiovascular: RRR, early systolic murmur best heard at the right sternal border no peripheral edema  Musculoskeletal: No deformities, no cyanosis or clubbing  Neuro: alert, non focal  Skin: Warm, no lesions or  rashes   Testing:  BAL fungal smear 1/8 >> negative, cx pending BAL AFB smear 1/8 >> negative, cx pending BAL cytology 1/8 >> negative Brushings cytology 1/8 >> negative TBBx's path 1/8 >> negative RF, ANA, ANCA, 1/8 >> all negative  IgE 1/7 >> 55 (normal)  Eosinophil count 1/7 >> 0.4 (normal)  Fungal hypersensitivity panel 1/7 >> all negative Quantiferon gold 1/7 >> negative HIV 1/7 >> negative   10/27/13 --  COMPARISON: CT ANGIO CHEST W/CM &/OR WO/CM dated 09/02/2013  FINDINGS:  Mediastinal lymph nodes are not enlarged by CT size criteria. Hilar  regions are difficult to definitively evaluate without IV contrast.  No axillary adenopathy. Heart is enlarged. No pericardial effusion.  Mild mosaic attenuation. Scattered linear scarring. Previously seen  areas of nodular and masslike airspace consolidation have resolved  in the interval. Scattered pulmonary nodules measure 4 mm or less in  size and are unchanged. No pleural fluid. Airway is unremarkable.  Incidental imaging of the upper abdomen shows the visualized  portions of the liver , adrenal glands, kidneys, spleen, pancreas,  stomach and bowel to  be grossly unremarkable. Cholecystectomy. No  upper abdominal adenopathy. No worrisome lytic or sclerotic lesions.  IMPRESSION:  1. Interval clearing of previously seen areas of nodular and  masslike airspace consolidation, most consistent with resolved  pneumonia.  2. Additional scattered pulmonary nodules measure 4 mm or less in  size and are unchanged. If the patient is at high risk for  bronchogenic carcinoma, follow-up chest CT at 1 year is recommended.  If the patient is at low risk, no follow-up is needed. This  recommendation follows the consensus statement: Guidelines for  Management of Small Pulmonary Nodules Detected on CT Scans: A  Statement from the [employer] as published in Radiology  2005; 237:395-400.  3. Mild mosaic pulmonary parenchymal attenuation can be  seen in the  setting of small airways disease      Assessment & Plan:  Sarcoidosis of lung Sarcoidosis that appears to be clinically stable compared with last visit. She is maintained on methotrexate at a stable dose.[name] Her overdue for her lab work and will arrange for scheduled monthly CBC, LFT every 3 months. Chest x-ray today to ensure no interval change compared with her CT scan from one year ago.   Chronic respiratory failure Factorial with chronic rotatory failure and hypoxemia due to sarcoidosis and likely also some hypoventilation from obesity. Continue current O2.

## 2015-05-04 NOTE — Patient Instructions (Signed)
We will schedule blood work today Continue your methotrexate as you are using it Wear O2 at all times.  We will check a CXR today  Follow with Dr Lamonte Sakai in 4 months or sooner if you have any problems.

## 2015-05-04 NOTE — Assessment & Plan Note (Signed)
Sarcoidosis that appears to be clinically stable compared with last visit. She is maintained on methotrexate at a stable dose.[name] Her overdue for her lab work and will arrange for scheduled monthly CBC, LFT every 3 months. Chest x-ray today to ensure no interval change compared with her CT scan from one year ago.

## 2015-05-06 ENCOUNTER — Telehealth: Payer: Self-pay | Admitting: Emergency Medicine

## 2015-05-06 DIAGNOSIS — D860 Sarcoidosis of lung: Secondary | ICD-10-CM

## 2015-05-06 NOTE — Telephone Encounter (Signed)
Called pt and is aware of results. Orders placed. Nothing further needed

## 2015-05-06 NOTE — Telephone Encounter (Signed)
Patient returned call and can be reached at [phone].

## 2015-05-06 NOTE — Telephone Encounter (Signed)
Per lab results: Result Note     Please inform the patient that Marissa Wilkerson red blood cell count is decreased compared with Marissa Wilkerson prior, and Marissa Wilkerson AST (one of Marissa Wilkerson liver tests) is elevated. Neither of these findings make it necessary to change Marissa Wilkerson methotrexate at this time but we do need to recheck both Marissa Wilkerson liver function tests, CBC and iron panel in 1 month. Depending on these results we will decide whether Marissa Wilkerson medication dose needs to be adjusted   Per CXR: Result Note     Please let Marissa Wilkerson know that Marissa Wilkerson chest x-ray is stable compared with Marissa Wilkerson previous films  ---- [id number] x1

## 2015-05-27 ENCOUNTER — Other Ambulatory Visit: Payer: Self-pay | Admitting: Gastroenterology

## 2015-05-28 ENCOUNTER — Encounter (HOSPITAL_COMMUNITY): Payer: Self-pay | Admitting: *Deleted

## 2015-05-31 ENCOUNTER — Ambulatory Visit (HOSPITAL_COMMUNITY)
Admission: RE | Admit: 2015-05-31 | Discharge: 2015-05-31 | Disposition: A | Discharge status: Home / Self Care | Payer: BLUE CROSS/BLUE SHIELD | Source: Ambulatory Visit | Attending: Gastroenterology | Admitting: Gastroenterology

## 2015-05-31 ENCOUNTER — Ambulatory Visit (HOSPITAL_COMMUNITY): Payer: BLUE CROSS/BLUE SHIELD | Admitting: Anesthesiology

## 2015-05-31 ENCOUNTER — Encounter (HOSPITAL_COMMUNITY)
Admission: RE | Disposition: A | Discharge status: Home / Self Care | Payer: Self-pay | Source: Ambulatory Visit | Attending: Gastroenterology

## 2015-05-31 ENCOUNTER — Encounter (HOSPITAL_COMMUNITY): Payer: Self-pay | Admitting: *Deleted

## 2015-05-31 DIAGNOSIS — Z87891 Personal history of nicotine dependence: Secondary | ICD-10-CM | POA: Insufficient documentation

## 2015-05-31 DIAGNOSIS — K2950 Unspecified chronic gastritis without bleeding: Secondary | ICD-10-CM | POA: Insufficient documentation

## 2015-05-31 DIAGNOSIS — K30 Functional dyspepsia: Secondary | ICD-10-CM | POA: Diagnosis not present

## 2015-05-31 DIAGNOSIS — D860 Sarcoidosis of lung: Secondary | ICD-10-CM | POA: Insufficient documentation

## 2015-05-31 DIAGNOSIS — Z79899 Other long term (current) drug therapy: Secondary | ICD-10-CM | POA: Diagnosis not present

## 2015-05-31 DIAGNOSIS — Z9981 Dependence on supplemental oxygen: Secondary | ICD-10-CM | POA: Insufficient documentation

## 2015-05-31 DIAGNOSIS — I10 Essential (primary) hypertension: Secondary | ICD-10-CM | POA: Insufficient documentation

## 2015-05-31 DIAGNOSIS — E119 Type 2 diabetes mellitus without complications: Secondary | ICD-10-CM | POA: Insufficient documentation

## 2015-05-31 DIAGNOSIS — K259 Gastric ulcer, unspecified as acute or chronic, without hemorrhage or perforation: Secondary | ICD-10-CM | POA: Diagnosis not present

## 2015-05-31 HISTORY — DX: Sarcoidosis of lung: D86.0

## 2015-05-31 HISTORY — DX: Anemia, unspecified: D64.9

## 2015-05-31 HISTORY — PX: ESOPHAGOGASTRODUODENOSCOPY (EGD) WITH PROPOFOL: SHX5813

## 2015-05-31 LAB — GLUCOSE, CAPILLARY
GLUCOSE-CAPILLARY: 246 mg/dL — AB (ref 65–99)
Glucose-Capillary: 300 mg/dL — ABNORMAL HIGH (ref 65–99)
Glucose-Capillary: 328 mg/dL — ABNORMAL HIGH (ref 65–99)

## 2015-05-31 SURGERY — ESOPHAGOGASTRODUODENOSCOPY (EGD) WITH PROPOFOL
Anesthesia: Monitor Anesthesia Care

## 2015-05-31 MED ORDER — INSULIN ASPART 100 UNIT/ML ~~LOC~~ SOLN
5.0000 [IU] | Freq: Once | SUBCUTANEOUS | Status: AC
  Administered 2015-05-31: 5 [IU] via SUBCUTANEOUS
  Filled 2015-05-31: qty 0.05

## 2015-05-31 MED ORDER — PROPOFOL 10 MG/ML IV BOLUS
INTRAVENOUS | Status: DC | PRN
  Administered 2015-05-31: 40 mg via INTRAVENOUS
  Administered 2015-05-31: 20 mg via INTRAVENOUS
  Administered 2015-05-31: 40 mg via INTRAVENOUS

## 2015-05-31 MED ORDER — ONDANSETRON HCL 4 MG/2ML IJ SOLN
INTRAMUSCULAR | Status: DC | PRN
  Administered 2015-05-31: 4 mg via INTRAVENOUS

## 2015-05-31 MED ORDER — LACTATED RINGERS IV SOLN
INTRAVENOUS | Status: DC
  Administered 2015-05-31: 1000 mL via INTRAVENOUS

## 2015-05-31 MED ORDER — BUTAMBEN-TETRACAINE-BENZOCAINE 2-2-14 % EX AERO
INHALATION_SPRAY | CUTANEOUS | Status: DC | PRN
Start: 2015-05-31 — End: 2015-05-31
  Administered 2015-05-31: 1 via TOPICAL

## 2015-05-31 MED ORDER — METOCLOPRAMIDE HCL 5 MG/ML IJ SOLN
INTRAMUSCULAR | Status: DC | PRN
  Administered 2015-05-31: 10 mg via INTRAVENOUS

## 2015-05-31 MED ORDER — PROPOFOL 10 MG/ML IV BOLUS
INTRAVENOUS | Status: AC
  Filled 2015-05-31: qty 20

## 2015-05-31 MED ORDER — LACTATED RINGERS IV SOLN
INTRAVENOUS | Status: DC | PRN
  Administered 2015-05-31: 12:00:00 via INTRAVENOUS

## 2015-05-31 MED ORDER — SODIUM CHLORIDE 0.9 % IV SOLN: INTRAVENOUS | Status: DC

## 2015-05-31 SURGICAL SUPPLY — 15 items

## 2015-05-31 NOTE — H&P (Signed)
  Problem: Delayed gastric emptying. Pulmonary sarcoidosis.  History: The patient is a 54 year old female born 25-Aug-1961. She requires continuous nasal oxygen therapy to treat chronic pulmonary sarcoidosis. She has type 2 diabetes mellitus. Her nuclear medicine gastric emptying study showed delayed gastric emptying (60% gastric emptying at 4 hours).  She is scheduled to undergo diagnostic esophagogastroduodenoscopy to rule out mechanical gastric outlet obstruction.  Past medical history: Type 2 diabetes mellitus. Pulmonary sarcoidosis. Diastolic heart dysfunction. Cholecystectomy. Bronchoscopy.  Medication allergies: Sulfa drugs cause skin rash. Metformin caused diarrhea.  Exam: The patient is alert and lying comfortably on the endoscopy stretcher. Abdomen is soft and nontender to palpation. Lungs are clear to auscultation. Cardiac exam reveals a regular rhythm.  Plan: Proceed with diagnostic esophagogastroduodenoscopy to rule out mechanical gastric outlet obstruction.

## 2015-05-31 NOTE — Anesthesia Preprocedure Evaluation (Signed)
Anesthesia Evaluation  Patient identified by MRN, date of birth, ID band Patient awake    Reviewed: Allergy & Precautions, NPO status , Patient's Chart, lab work & pertinent test results  Airway Mallampati: II  TM Distance: >3 FB Neck ROM: Full    Dental no notable dental hx. (+) Upper Dentures, Lower Dentures   Pulmonary former smoker,  sarcoidosis   Pulmonary exam normal breath sounds clear to auscultation       Cardiovascular hypertension, Pt. on medications Normal cardiovascular exam Rhythm:Regular Rate:Normal     Neuro/Psych negative neurological ROS  negative psych ROS   GI/Hepatic negative GI ROS, Neg liver ROS,   Endo/Other  diabetes, Type 2, Oral Hypoglycemic Agents  Renal/GU negative Renal ROS  negative genitourinary   Musculoskeletal negative musculoskeletal ROS (+)   Abdominal   Peds negative pediatric ROS (+)  Hematology negative hematology ROS (+)   Anesthesia Other Findings   Reproductive/Obstetrics negative OB ROS                             Anesthesia Physical Anesthesia Plan  ASA: III  Anesthesia Plan: MAC   Post-op Pain Management:    Induction:   Airway Management Planned: Natural Airway  Additional Equipment:   Intra-op Plan:   Post-operative Plan:   Informed Consent: I have reviewed the patients History and Physical, chart, labs and discussed the procedure including the risks, benefits and alternatives for the proposed anesthesia with the patient or authorized representative who has indicated his/her understanding and acceptance.   Dental advisory given  Plan Discussed with: CRNA  Anesthesia Plan Comments:         Anesthesia Quick Evaluation

## 2015-05-31 NOTE — Transfer of Care (Signed)
Immediate Anesthesia Transfer of Care Note  Patient: Marissa Wilkerson  Procedure(s) Performed: Procedure(s): ESOPHAGOGASTRODUODENOSCOPY (EGD) WITH PROPOFOL (N/A)  Patient Location: PACU and Endoscopy Unit  Anesthesia Type:MAC  Level of Consciousness: awake, alert , oriented and patient cooperative  Airway & Oxygen Therapy: Patient Spontanous Breathing and Patient connected to nasal cannula oxygen  Post-op Assessment: Report given to RN, Post -op Vital signs reviewed and stable and Patient moving all extremities  Post vital signs: Reviewed and stable  Last Vitals:  Filed Vitals:   05/31/15 1122  BP:   Temp: 36.9 C  Resp:     Complications: No apparent anesthesia complications

## 2015-05-31 NOTE — Op Note (Signed)
Problem: Delayed gastric emptying (03/25/2015 nuclear medicine gastric emptying study showed 60% gastric emptying at 4 hours). Pulmonary sarcoidosis. Type 2 diabetes mellitus. Chronic aspirin 81 mg daily.  Endoscopist: Earle Gell  Premedication: Propofol administered by anesthesia  Procedure: Diagnostic esophagogastroduodenoscopy The patient was placed in the left lateral decubitus position. The Pentax gastroscope was passed through the posterior hypopharynx into the proximal esophagus without difficulty. The hypopharynx, larynx, and vocal cords appeared normal.  Esophagoscopy: The proximal, mid, and lower segments of the esophageal mucosa appeared normal. The squamocolumnar junction was regular in appearance and noted at 40 cm from the incisor teeth.  Gastroscopy: Retroflex view of the gastric cardia and fundus was normal. The gastric body appeared normal. There were three ulcers in the gastric antrum with exudative bases and no stigmata of gastric bleeding. The pylorus was patent. There was no sign of gastric outlet obstruction. Gastric antral ulcer biopsies were performed.  Duodenoscopy: The duodenal bulb and descending duodenum appeared normal.  Assessment: Esophagogastroduodenoscopy showed no evidence of mechanical gastric outlet obstruction. Three ulcers with exudative basis measuring 3 mm in diameter were present in the gastric antrum; biopsies were performed.  Recommendation: The patient should stop taking aspirin. Gastric sarcoidosis is quite rare but non-steroidal anti-inflammatory medication induced gastric ulcers are common. Screen for H. pylori gastritis is pending.

## 2015-05-31 NOTE — Anesthesia Postprocedure Evaluation (Signed)
  Anesthesia Post-op Note  Patient: Marissa Wilkerson  Procedure(s) Performed: Procedure(s) (LRB): ESOPHAGOGASTRODUODENOSCOPY (EGD) WITH PROPOFOL (N/A)  Patient Location: PACU  Anesthesia Type: MAC  Level of Consciousness: awake and alert   Airway and Oxygen Therapy: Patient Spontanous Breathing  Post-op Pain: mild  Post-op Assessment: Post-op Vital signs reviewed, Patient's Cardiovascular Status Stable, Respiratory Function Stable, Patent Airway and No signs of Nausea or vomiting  Last Vitals:  Filed Vitals:   05/31/15 1320  BP: 128/57  Pulse: 79  Temp:   Resp: 14    Post-op Vital Signs: stable   Complications: No apparent anesthesia complications

## 2015-05-31 NOTE — Discharge Instructions (Signed)
Esophagogastroduodenoscopy °Care After °Refer to this sheet in the next few weeks. These instructions provide you with information on caring for yourself after your procedure. Your caregiver may also give you more specific instructions. Your treatment has been planned according to current medical practices, but problems sometimes occur. Call your caregiver if you have any problems or questions after your procedure.  °HOME CARE INSTRUCTIONS °· Do not eat or drink anything until the numbing medicine (local anesthetic) has worn off and your gag reflex has returned. You will know that the local anesthetic has worn off when you can swallow comfortably. °· Do not drive for 12 hours after the procedure or as directed by your caregiver. °· Only take medicines as directed by your caregiver. °SEEK MEDICAL CARE IF:  °· You cannot stop coughing. °· You are not urinating at all or less than usual. °SEEK IMMEDIATE MEDICAL CARE IF: °· You have difficulty swallowing. °· You cannot eat or drink. °· You have worsening throat or chest pain. °· You have dizziness, lightheadedness, or you faint. °· You have nausea or vomiting. °· You have chills. °· You have a fever. °· You have severe abdominal pain. °· You have black, tarry, or bloody stools. °Document Released: 07/31/2012 Document Reviewed: 07/31/2012 °[employer]® Patient Information ©2015 [employer]. This information is not intended to replace advice given to you by your health care provider. Make sure you discuss any questions you have with your health care provider. ° °

## 2015-06-01 ENCOUNTER — Encounter (HOSPITAL_COMMUNITY): Payer: Self-pay | Admitting: Gastroenterology

## 2015-06-14 ENCOUNTER — Encounter: Payer: Self-pay | Admitting: Emergency Medicine

## 2015-06-16 ENCOUNTER — Encounter: Payer: Self-pay | Admitting: Emergency Medicine

## 2015-06-16 DIAGNOSIS — D860 Sarcoidosis of lung: Secondary | ICD-10-CM

## 2015-06-17 MED ORDER — FOLIC ACID 1 MG PO TABS: 1.0000 mg | ORAL_TABLET | Freq: Every day | ORAL | Status: DC

## 2015-06-17 MED ORDER — METHOTREXATE 2.5 MG PO TABS: ORAL_TABLET | ORAL | Status: DC

## 2015-06-17 NOTE — Addendum Note (Signed)
Addended by: Desmond Dike C on: 06/17/2015 04:14 PM   Modules accepted: Orders

## 2015-09-02 MED FILL — raNITIdine HCL 150 MG TABS: 150 | 90 days supply | Qty: 180 | Fill #0

## 2015-09-02 MED FILL — PROMETHAZINE 25 MG TABLET: 25 | 22 days supply | Qty: 90 | Fill #0

## 2015-09-10 ENCOUNTER — Encounter: Payer: Self-pay | Admitting: Emergency Medicine

## 2015-09-10 MED ORDER — METHOTREXATE 2.5 MG PO TABS: ORAL_TABLET | ORAL | Status: DC

## 2015-09-10 MED FILL — METHOTREXATE 2.5 MG TABLET: 2.5 | 28 days supply | Qty: 24 | Fill #0

## 2015-09-14 ENCOUNTER — Other Ambulatory Visit (INDEPENDENT_AMBULATORY_CARE_PROVIDER_SITE_OTHER): Payer: BLUE CROSS/BLUE SHIELD

## 2015-09-14 ENCOUNTER — Encounter: Payer: Self-pay | Admitting: Emergency Medicine

## 2015-09-14 ENCOUNTER — Ambulatory Visit (INDEPENDENT_AMBULATORY_CARE_PROVIDER_SITE_OTHER): Payer: BLUE CROSS/BLUE SHIELD | Admitting: Emergency Medicine

## 2015-09-14 VITALS — BP 136/92 | HR 72 | Wt 231.0 lb

## 2015-09-14 DIAGNOSIS — D869 Sarcoidosis, unspecified: Secondary | ICD-10-CM

## 2015-09-14 DIAGNOSIS — J449 Chronic obstructive pulmonary disease, unspecified: Secondary | ICD-10-CM

## 2015-09-14 LAB — HEPATIC FUNCTION PANEL
ALBUMIN: 3.4 g/dL — AB (ref 3.5–5.2)
ALT: 14 U/L (ref 0–35)
AST: 14 U/L (ref 0–37)
Alkaline Phosphatase: 122 U/L — ABNORMAL HIGH (ref 39–117)
Bilirubin, Direct: 0 mg/dL (ref 0.0–0.3)
Total Bilirubin: 0.3 mg/dL (ref 0.2–1.2)
Total Protein: 6.9 g/dL (ref 6.0–8.3)

## 2015-09-14 LAB — CBC WITH DIFFERENTIAL/PLATELET
BASOS ABS: 0.1 10*3/uL (ref 0.0–0.1)
BASOS PCT: 0.7 % (ref 0.0–3.0)
EOS ABS: 0.3 10*3/uL (ref 0.0–0.7)
Eosinophils Relative: 3.2 % (ref 0.0–5.0)
HCT: 32.7 % — ABNORMAL LOW (ref 36.0–46.0)
HEMOGLOBIN: 11.2 g/dL — AB (ref 12.0–15.0)
Lymphocytes Relative: 9.7 % — ABNORMAL LOW (ref 12.0–46.0)
Lymphs Abs: 0.9 10*3/uL (ref 0.7–4.0)
MCHC: 34.2 g/dL (ref 30.0–36.0)
MCV: 89.9 fl (ref 78.0–100.0)
MONO ABS: 0.5 10*3/uL (ref 0.1–1.0)
Monocytes Relative: 5.6 % (ref 3.0–12.0)
Neutro Abs: 7.7 10*3/uL (ref 1.4–7.7)
Neutrophils Relative %: 80.8 % — ABNORMAL HIGH (ref 43.0–77.0)
Platelets: 264 10*3/uL (ref 150.0–400.0)
RBC: 3.64 Mil/uL — AB (ref 3.87–5.11)
RDW: 15.4 % (ref 11.5–15.5)
WBC: 9.5 10*3/uL (ref 4.0–10.5)

## 2015-09-14 NOTE — Assessment & Plan Note (Signed)
Albuterol when necessary

## 2015-09-14 NOTE — Assessment & Plan Note (Signed)
Be clinically stable at this time. Exam is stable. We will repeat her high-resolution CT scan of the chest to look for interval change over the last year and a half. We will continue methotrexate at the same dose and follow her CBC and LFT.

## 2015-09-14 NOTE — Progress Notes (Signed)
Subjective:    Patient ID: Marissa Wilkerson, female    DOB: 21-Jul-1961, 55 y.o.   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 I saw her initially in consultation after admission to [facility] for hypoxemia 09/02/13. She had experienced dyspnea that has been insidious over about 3-4 weeks. She denied FCS or purulent sputum production. No recent URI or unusual exposures or travels. She was treated with HCTZ in 9/'14 by Dr Delfina Redwood when she developed significant LE edema. TTE from 10/14 showed no evidence of PAH or LV dysfxn. Following her presentation to [facility] CT-PA revealed Abnormal lungs with numerous indistinct bilateral pulmonary nodules (semi-solid appearing), with superimposed confluent mass like pulmonary opacity in the left lower lung crossing the major fissure. There was no PE.  We performed serologies and FOB during the admission. She was discharged to home on O2 with plan to f/u today. She feels some dyspnea w exertion. Continues to desat with exertion on RA.     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 We have been transitioning prednisone to methotrexate. She is now off prednisone for the last 7 weeks. She feels fewer side effects, same breathing.   ROV 4 /28/16 -- follow-up visit for sarcoidosis status post biopsy. She is on methotrexate, her current dose is 15mg  x 3 months. She is off pred and has lost 30 lbs. She is supposed to start aquatic therapy soon.  She notes that she has some desats w exertion on 3L/min to the 80s percent.   ROV 05/04/15 -- follow-up visit for history of biopsy-proven sarcoidosis. Her most recent CT scan of the chest was October 2015 that showed improved chronic granulomatous disease. She is being managed on methotrexate. Her last labs were at Dr Polite's office but her  last LFT were done with me. Her breathing is stable. She has noticed some nose bleeding over the last few week. She has also been having some emesis and nausea. She has gastroparesis on an emptying scan.  She has lost 10 more pounds since last time.   ROV1/17/17--Marissa Wilkerson has a history of sarcoidosis documented by biopsy. Currently managed on methotrexate 15 mg weekly. No real clinical change since last time. She is trying to walk more, exert more. She has lost some wt - about 50lbs over 1 year.    Review of Systems  Constitutional: Negative for fever, activity change and unexpected weight change.  HENT: Positive for nosebleeds. Negative for congestion, dental problem, ear pain, postnasal drip, rhinorrhea, sinus pressure, sneezing, sore throat and trouble swallowing.   Eyes: Negative for redness and itching.  Respiratory: Positive for shortness of breath. Negative for cough, chest tightness and wheezing.   Cardiovascular: Positive for leg swelling. Negative for palpitations.  Gastrointestinal: Positive for nausea and vomiting.  Genitourinary: Negative for dysuria.  Musculoskeletal: Negative for back pain and joint swelling.  Skin: Negative for rash.  Neurological: Negative for headaches.  Hematological: Does not bruise/bleed easily.  Psychiatric/Behavioral: Negative for dysphoric mood. The patient is not nervous/anxious.        Objective:   Physical Exam Filed Vitals:   09/14/15 1601  BP: 136/92  Pulse: 72  Weight: 231 lb (104.781 kg)  SpO2: 98%     Gen: Pleasant, overwt woman, in no distress,  normal affect  ENT: No lesions,  mouth  clear,  oropharynx clear, no postnasal drip, some cushingoid features  Neck: No JVD, no stridor  Lungs: No use of accessory muscles, clear without rales or rhonchi, no wheeze  Cardiovascular: RRR, early systolic murmur best heard at the right sternal border, 1+ pitting edema lower extremities  Musculoskeletal: No deformities, no cyanosis or  clubbing  Neuro: alert, non focal  Skin: Warm,  Some cat scratches on her right forearm                                                                                   Testing:  BAL fungal smear 1/8 >> negative, cx pending BAL AFB smear 1/8 >> negative, cx pending BAL cytology 1/8 >> negative Brushings cytology 1/8 >> negative TBBx's path 1/8 >> negative RF, ANA, ANCA, 1/8 >> all negative  IgE 1/7 >> 55 (normal)  Eosinophil count 1/7 >> 0.4 (normal)  Fungal hypersensitivity panel 1/7 >> all negative Quantiferon gold 1/7 >> negative HIV 1/7 >> negative   CBC    Component Value Date/Time   WBC 10.7* 05/04/2015 1203   RBC 2.68* 05/04/2015 1203   HGB 8.8 Repeated and verified X2.* 05/04/2015 1203   HCT 26.0 Repeated and verified X2.* 05/04/2015 1203   PLT 255.0 05/04/2015 1203   MCV 97.3 05/04/2015 1203   MCH 29.4 03/08/2014 0350   MCHC 33.8 05/04/2015 1203   RDW 17.3* 05/04/2015 1203   LYMPHSABS 1.2 12/24/2014 0943   MONOABS 0.7 12/24/2014 0943   EOSABS 0.4 12/24/2014 0943   EOSABS 0.4 09/03/2013 1331   BASOSABS 0.0 12/24/2014 0943   BMP Latest Ref Rng 03/08/2014 03/06/2014 03/03/2014  Glucose 70 - 99 mg/dL 182(H) 139(H) 183(H)  BUN 6 - 23 mg/dL 9 16 15   Creatinine 0.50 - 1.10 mg/dL 0.49(L) 0.60 0.64  Sodium 137 - 147 mEq/L 136(L) 137 138  Potassium 3.7 - 5.3 mEq/L 4.1 4.6 4.2  Chloride 96 - 112 mEq/L 97 99 99  CO2 19 - 32 mEq/L 26 26 23   Calcium 8.4 - 10.5 mg/dL 8.9 8.7 9.4       10/27/13 --  COMPARISON: CT ANGIO CHEST W/CM &/OR WO/CM dated 09/02/2013  FINDINGS:  Mediastinal lymph nodes are not enlarged by CT size criteria. Hilar  regions are difficult to definitively evaluate without IV contrast.  No axillary adenopathy. Heart is enlarged. No pericardial effusion.  Mild mosaic attenuation. Scattered linear scarring. Previously seen  areas of nodular and masslike airspace consolidation have resolved  in the interval. Scattered pulmonary nodules measure 4 mm or  less in  size and are unchanged. No pleural fluid. Airway is unremarkable.  Incidental imaging of the upper abdomen shows the visualized  portions of the liver , adrenal glands, kidneys, spleen, pancreas,  stomach and bowel to be grossly unremarkable. Cholecystectomy. No  upper abdominal adenopathy. No worrisome lytic or sclerotic lesions.  IMPRESSION:  1. Interval clearing of previously seen areas of nodular and  masslike airspace consolidation, most consistent with resolved  pneumonia.  2. Additional scattered pulmonary nodules measure 4 mm or less in  size and are unchanged. If the patient is at high risk for  bronchogenic carcinoma, follow-up chest CT at 1  year is recommended.  If the patient is at low risk, no follow-up is needed. This  recommendation follows the consensus statement: Guidelines for  Management of Small Pulmonary Nodules Detected on CT Scans: A  Statement from the [employer] as published in Radiology  2005; 237:395-400.  3. Mild mosaic pulmonary parenchymal attenuation can be seen in the  setting of small airways disease    05/04/15 COMPARISON: PA and lateral chest x-ray of May 27, 2014  FINDINGS: The lungs are adequately inflated. There is stable scarring lateral to the left heart border. There is no pleural effusion. The cardiac silhouette is mildly enlarged but stable. The pulmonary vascularity is not engorged. The mediastinum is normal in width. The bony thorax exhibits no acute abnormality.  IMPRESSION: Stable scarring in the low left lower lung field. Stable mild cardiac enlargement. There is no active cardiopulmonary disease     Assessment & Plan:  Sarcoidosis of lung Be clinically stable at this time. Exam is stable. We will repeat her high-resolution CT scan of the chest to look for interval change over the last year and a half. We will continue methotrexate at the same dose and follow her CBC and LFT.   Obstructive lung  disease Albuterol when necessary

## 2015-09-14 NOTE — Patient Instructions (Signed)
Please continue your methotrexate 15mg  weekly Blood work (LFT and CBC) next to be done monthly We will perform a high resolution CT chest, no contrast.  Follow with Dr Lamonte Sakai in 3 months or sooner if you have any problems.

## 2015-09-17 MED FILL — KLOR-CON M10 TABLET: 10 | 90 days supply | Qty: 90 | Fill #1

## 2015-09-17 MED FILL — ATORVASTATIN 10 MG TABLET: 10 | 90 days supply | Qty: 90 | Fill #1

## 2015-09-17 MED FILL — HYDROCODON-APAP 10-325: 10-325 | 20 days supply | Qty: 120 | Fill #0

## 2015-09-17 MED FILL — tiZANidine HCL 4 MG TABS: 4 | 30 days supply | Qty: 90 | Fill #0

## 2015-09-17 MED FILL — LISINOPRIL 10 MG TABLET: 10 | 90 days supply | Qty: 90 | Fill #1

## 2015-09-17 MED FILL — FUROSEMIDE 40 MG TABLET: 40 | 90 days supply | Qty: 180 | Fill #1

## 2015-09-17 MED FILL — GABAPENTIN 300 MG CAPSULE: 300 | 90 days supply | Qty: 270 | Fill #1

## 2015-09-17 MED FILL — JANUVIA 100 MG TABLET: 100 | 90 days supply | Qty: 90 | Fill #1

## 2015-09-21 ENCOUNTER — Inpatient Hospital Stay: Admission: RE | Admit: 2015-09-21 | Payer: BLUE CROSS/BLUE SHIELD | Source: Ambulatory Visit

## 2015-09-22 ENCOUNTER — Inpatient Hospital Stay: Admission: RE | Admit: 2015-09-22 | Payer: BLUE CROSS/BLUE SHIELD | Source: Ambulatory Visit

## 2015-09-27 ENCOUNTER — Inpatient Hospital Stay: Admission: RE | Admit: 2015-09-27 | Payer: BLUE CROSS/BLUE SHIELD | Source: Ambulatory Visit

## 2015-10-01 ENCOUNTER — Ambulatory Visit (INDEPENDENT_AMBULATORY_CARE_PROVIDER_SITE_OTHER)
Admission: RE | Admit: 2015-10-01 | Discharge: 2015-10-01 | Disposition: A | Discharge status: Home / Self Care | Payer: BLUE CROSS/BLUE SHIELD | Source: Ambulatory Visit | Attending: Emergency Medicine | Admitting: Emergency Medicine

## 2015-10-01 DIAGNOSIS — D869 Sarcoidosis, unspecified: Secondary | ICD-10-CM

## 2015-10-06 MED FILL — METHOTREXATE 2.5 MG TABLET: 2.5 | 28 days supply | Qty: 24 | Fill #1

## 2015-10-06 MED FILL — PROMETHAZINE 25 MG TABLET: 25 | 22 days supply | Qty: 90 | Fill #1

## 2015-10-11 MED FILL — tiZANidine HCL 4 MG TABS: 4 | 30 days supply | Qty: 90 | Fill #1

## 2015-10-27 MED FILL — METHOTREXATE 2.5 MG TABLET: 2.5 | 28 days supply | Qty: 24 | Fill #2

## 2015-10-29 MED FILL — PROMETHAZINE 25 MG TABLET: 25 | 22 days supply | Qty: 90 | Fill #2

## 2015-11-10 MED FILL — tiZANidine HCL 4 MG TABS: 4 | 30 days supply | Qty: 90 | Fill #2

## 2015-11-26 ENCOUNTER — Other Ambulatory Visit (INDEPENDENT_AMBULATORY_CARE_PROVIDER_SITE_OTHER): Payer: BLUE CROSS/BLUE SHIELD

## 2015-11-26 DIAGNOSIS — D869 Sarcoidosis, unspecified: Secondary | ICD-10-CM

## 2015-11-26 LAB — HEPATIC FUNCTION PANEL
ALBUMIN: 3.7 g/dL (ref 3.5–5.2)
ALK PHOS: 105 U/L (ref 39–117)
ALT: 19 U/L (ref 0–35)
AST: 18 U/L (ref 0–37)
Bilirubin, Direct: 0.1 mg/dL (ref 0.0–0.3)
TOTAL PROTEIN: 7.5 g/dL (ref 6.0–8.3)
Total Bilirubin: 0.3 mg/dL (ref 0.2–1.2)

## 2015-11-26 LAB — CBC WITH DIFFERENTIAL/PLATELET
BASOS ABS: 0.1 10*3/uL (ref 0.0–0.1)
Basophils Relative: 0.7 % (ref 0.0–3.0)
EOS ABS: 0.2 10*3/uL (ref 0.0–0.7)
Eosinophils Relative: 1.8 % (ref 0.0–5.0)
HCT: 28.4 % — ABNORMAL LOW (ref 36.0–46.0)
Hemoglobin: 9.6 g/dL — ABNORMAL LOW (ref 12.0–15.0)
LYMPHS ABS: 1.2 10*3/uL (ref 0.7–4.0)
Lymphocytes Relative: 10 % — ABNORMAL LOW (ref 12.0–46.0)
MCHC: 33.7 g/dL (ref 30.0–36.0)
MCV: 91.2 fl (ref 78.0–100.0)
MONO ABS: 0.2 10*3/uL (ref 0.1–1.0)
MONOS PCT: 1.4 % — AB (ref 3.0–12.0)
NEUTROS ABS: 10.4 10*3/uL — AB (ref 1.4–7.7)
NEUTROS PCT: 86.1 % — AB (ref 43.0–77.0)
PLATELETS: 348 10*3/uL (ref 150.0–400.0)
RBC: 3.11 Mil/uL — AB (ref 3.87–5.11)
RDW: 14.8 % (ref 11.5–15.5)
WBC: 12.1 10*3/uL — ABNORMAL HIGH (ref 4.0–10.5)

## 2015-11-26 MED FILL — HYDROCODON-APAP 10-325: 10-325 | 20 days supply | Qty: 120 | Fill #0

## 2015-11-29 MED FILL — CEPHALEXIN 500 MG CAPSULE: 500 | 7 days supply | Qty: 21 | Fill #0

## 2015-11-29 MED FILL — DULoxetine HCL 30 MG CPEP: 30 | 30 days supply | Qty: 30 | Fill #0

## 2015-12-03 MED FILL — PROMETHAZINE 25 MG TABLET: 25 | 22 days supply | Qty: 90 | Fill #3

## 2015-12-07 ENCOUNTER — Encounter: Payer: Self-pay | Admitting: Emergency Medicine

## 2015-12-07 DIAGNOSIS — D860 Sarcoidosis of lung: Secondary | ICD-10-CM

## 2015-12-08 MED ORDER — METHOTREXATE 2.5 MG PO TABS: ORAL_TABLET | ORAL | Status: DC

## 2015-12-08 MED ORDER — FOLIC ACID 1 MG PO TABS: 1.0000 mg | ORAL_TABLET | Freq: Every day | ORAL | Status: DC

## 2015-12-08 MED FILL — METHOTREXATE 2.5 MG TABLET: 2.5 | 28 days supply | Qty: 24 | Fill #0

## 2015-12-08 MED FILL — FOLIC ACID 1 MG TABLET: 1 | 30 days supply | Qty: 30 | Fill #0

## 2015-12-10 MED FILL — ATORVASTATIN 10 MG TABLET: 10 | 90 days supply | Qty: 90 | Fill #2

## 2015-12-10 MED FILL — raNITIdine HCL 150 MG TABS: 150 | 90 days supply | Qty: 180 | Fill #1

## 2015-12-10 MED FILL — FUROSEMIDE 40 MG TABLET: 40 | 90 days supply | Qty: 180 | Fill #2

## 2015-12-10 MED FILL — GABAPENTIN 300 MG CAPSULE: 300 | 90 days supply | Qty: 270 | Fill #2

## 2015-12-10 MED FILL — KLOR-CON M10 TABLET: 10 | 90 days supply | Qty: 90 | Fill #2

## 2015-12-10 MED FILL — LISINOPRIL 10 MG TABLET: 10 | 90 days supply | Qty: 90 | Fill #2

## 2015-12-10 MED FILL — JANUVIA 100 MG TABLET: 100 | 90 days supply | Qty: 90 | Fill #2

## 2015-12-13 ENCOUNTER — Ambulatory Visit: Payer: BLUE CROSS/BLUE SHIELD | Admitting: Emergency Medicine

## 2015-12-31 MED FILL — DULoxetine HCL 30 MG CPEP: 30 | 30 days supply | Qty: 30 | Fill #1

## 2016-01-03 ENCOUNTER — Encounter: Payer: Self-pay | Admitting: Emergency Medicine

## 2016-01-03 DIAGNOSIS — D860 Sarcoidosis of lung: Secondary | ICD-10-CM

## 2016-01-03 MED FILL — METHOTREXATE 2.5 MG TABLET: 2.5 | 28 days supply | Qty: 24 | Fill #1

## 2016-01-03 MED FILL — FOLIC ACID 1 MG TABLET: 1 | 30 days supply | Qty: 30 | Fill #1

## 2016-01-03 MED FILL — PROMETHAZINE 25 MG TABLET: 25 | 22 days supply | Qty: 90 | Fill #0

## 2016-01-04 MED FILL — tiZANidine HCL 4 MG TABS: 4 | 30 days supply | Qty: 90 | Fill #0

## 2016-01-12 ENCOUNTER — Ambulatory Visit: Payer: BLUE CROSS/BLUE SHIELD | Admitting: Emergency Medicine

## 2016-01-31 MED FILL — DULoxetine HCL 30 MG CPEP: 30 | 30 days supply | Qty: 30 | Fill #2

## 2016-01-31 MED FILL — PROMETHAZINE 25 MG TABLET: 25 | 22 days supply | Qty: 90 | Fill #1

## 2016-01-31 MED FILL — FOLIC ACID 1 MG TABLET: 1 | 30 days supply | Qty: 30 | Fill #2

## 2016-01-31 MED FILL — METHOTREXATE 2.5 MG TABLET: 2.5 | 28 days supply | Qty: 24 | Fill #2

## 2016-01-31 MED FILL — tiZANidine HCL 4 MG TABS: 4 | 30 days supply | Qty: 90 | Fill #1

## 2016-02-22 ENCOUNTER — Ambulatory Visit (INDEPENDENT_AMBULATORY_CARE_PROVIDER_SITE_OTHER): Payer: BLUE CROSS/BLUE SHIELD | Admitting: Emergency Medicine

## 2016-02-22 ENCOUNTER — Encounter: Payer: Self-pay | Admitting: Emergency Medicine

## 2016-02-22 ENCOUNTER — Other Ambulatory Visit (INDEPENDENT_AMBULATORY_CARE_PROVIDER_SITE_OTHER): Payer: BLUE CROSS/BLUE SHIELD

## 2016-02-22 VITALS — BP 122/70 | HR 91 | Ht 63.0 in | Wt 217.0 lb

## 2016-02-22 DIAGNOSIS — R29898 Other symptoms and signs involving the musculoskeletal system: Secondary | ICD-10-CM | POA: Diagnosis not present

## 2016-02-22 DIAGNOSIS — R531 Weakness: Secondary | ICD-10-CM

## 2016-02-22 DIAGNOSIS — D860 Sarcoidosis of lung: Secondary | ICD-10-CM | POA: Diagnosis not present

## 2016-02-22 DIAGNOSIS — D869 Sarcoidosis, unspecified: Secondary | ICD-10-CM

## 2016-02-22 DIAGNOSIS — R296 Repeated falls: Secondary | ICD-10-CM | POA: Diagnosis not present

## 2016-02-22 LAB — HEPATIC FUNCTION PANEL
ALT: 16 U/L (ref 0–35)
AST: 12 U/L (ref 0–37)
Albumin: 3.5 g/dL (ref 3.5–5.2)
Alkaline Phosphatase: 110 U/L (ref 39–117)
BILIRUBIN DIRECT: 0.1 mg/dL (ref 0.0–0.3)
BILIRUBIN TOTAL: 0.3 mg/dL (ref 0.2–1.2)
Total Protein: 7.2 g/dL (ref 6.0–8.3)

## 2016-02-22 LAB — CBC WITH DIFFERENTIAL/PLATELET
BASOS PCT: 0.3 % (ref 0.0–3.0)
Basophils Absolute: 0 10*3/uL (ref 0.0–0.1)
EOS ABS: 0.4 10*3/uL (ref 0.0–0.7)
Eosinophils Relative: 3.3 % (ref 0.0–5.0)
HCT: 31 % — ABNORMAL LOW (ref 36.0–46.0)
Hemoglobin: 10.4 g/dL — ABNORMAL LOW (ref 12.0–15.0)
LYMPHS ABS: 1.1 10*3/uL (ref 0.7–4.0)
Lymphocytes Relative: 9.4 % — ABNORMAL LOW (ref 12.0–46.0)
MCHC: 33.7 g/dL (ref 30.0–36.0)
MCV: 90.6 fl (ref 78.0–100.0)
MONO ABS: 0.6 10*3/uL (ref 0.1–1.0)
Monocytes Relative: 5 % (ref 3.0–12.0)
NEUTROS ABS: 9.5 10*3/uL — AB (ref 1.4–7.7)
NEUTROS PCT: 82 % — AB (ref 43.0–77.0)
PLATELETS: 283 10*3/uL (ref 150.0–400.0)
RBC: 3.43 Mil/uL — ABNORMAL LOW (ref 3.87–5.11)
RDW: 15.4 % (ref 11.5–15.5)
WBC: 11.5 10*3/uL — ABNORMAL HIGH (ref 4.0–10.5)

## 2016-02-22 NOTE — Patient Instructions (Signed)
Labwork today and monthly We will refer you to see Neurology regarding your falls, weakness, altered cognitive function. This could be due to medications, but I would like to insure we do not believe there is a contribution of Sarcoidosis.   Please continue MTX as you are taking it  Continue oxygen as you are using it at 3L/min.  Follow with Dr Lamonte Sakai next available after your Neurology appointment

## 2016-02-22 NOTE — Assessment & Plan Note (Signed)
To be overall fairly clinically stable with regard to exertional tolerance. Her CT scan of the chest from February was stable as well. She's been on methotrexate without labs and this needs to be checked now with better compliance in the future. Discussed this today. We will ensure that she not desaturating with exercise on 3 L/m.

## 2016-02-22 NOTE — Progress Notes (Signed)
Subjective:    Patient ID: Marissa Wilkerson, female    DOB: 1960-10-20, 55 y.o.   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 I saw her initially in consultation after admission to [facility] for hypoxemia 09/02/13. She had experienced dyspnea that has been insidious over about 3-4 weeks. She denied FCS or purulent sputum production. No recent URI or unusual exposures or travels. She was treated with HCTZ in 9/'14 by Dr Delfina Redwood when she developed significant LE edema. TTE from 10/14 showed no evidence of PAH or LV dysfxn. Following her presentation to [facility] CT-PA revealed Abnormal lungs with numerous indistinct bilateral pulmonary nodules (semi-solid appearing), with superimposed confluent mass like pulmonary opacity in the left lower lung crossing the major fissure. There was no PE.  We performed serologies and FOB during the admission. She was discharged to home on O2 with plan to f/u today. She feels some dyspnea w exertion. Continues to desat with exertion on RA.     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 We have been transitioning prednisone to methotrexate. She is now off prednisone for the last 7 weeks. She feels fewer side effects, same breathing.   ROV 4 /28/16 -- follow-up visit for sarcoidosis status post biopsy. She is on methotrexate, her current dose is 15mg  x 3 months. She is off pred and has lost 30 lbs. She is supposed to start aquatic therapy soon.  She notes that she has some desats w exertion on 3L/min to the 80s percent.   ROV 05/04/15 -- follow-up visit for history of biopsy-proven sarcoidosis. Her most recent CT scan of the chest was October 2015 that showed improved chronic granulomatous disease. She is being managed on methotrexate. Her last labs were at Dr Polite's office but her  last LFT were done with me. Her breathing is stable. She has noticed some nose bleeding over the last few week. She has also been having some emesis and nausea. She has gastroparesis on an emptying scan.  She has lost 10 more pounds since last time.   ROV1/17/17--Marissa Wilkerson has a history of sarcoidosis documented by biopsy. Currently managed on methotrexate 15 mg weekly. No real clinical change since last time. She is trying to walk more, exert more. She has lost some wt - about 50lbs over 1 year.   ROV 02/22/16 -- patient follows up for history of biopsy-proven sarcoidosis.  On MTX 15mg  qweek. Her most recent CT scan of the chest was 10/01/15 which showed stable nodular disease, no significant change in interstitial findings, no bronchiectasis or Honeycombing present. She has not gotten her labs as she is supposed to - last was in March '17. She has had 2 falls, dizzy and then falls. No numbness or tingling but feels that she loses leg power. No syncope. She was started on cymbalta - this was the only medication change. Her memory has worsened some. Needs labs today. Wearing 3L/min. Dyspnea stable. She has noticed a caugh lately, some increased rhinorrhea   Review of Systems  Constitutional: Negative for fever, activity change and unexpected weight change.  HENT: Positive for nosebleeds. Negative for congestion, dental problem, ear pain, postnasal drip, rhinorrhea, sinus pressure, sneezing, sore throat and trouble swallowing.   Eyes: Negative for redness and itching.  Respiratory: Positive for shortness of breath. Negative for cough,  chest tightness and wheezing.   Cardiovascular: Negative for palpitations and leg swelling.  Gastrointestinal: Positive for vomiting. Negative for nausea.  Genitourinary: Negative for dysuria.  Musculoskeletal: Negative for back pain and joint swelling.  Skin: Negative for rash.  Neurological: Negative for headaches.  Hematological: Does not bruise/bleed easily.   Psychiatric/Behavioral: Negative for dysphoric mood. The patient is not nervous/anxious.        Objective:   Physical Exam Filed Vitals:   02/22/16 1147  BP: 122/70  Pulse: 91  Height: 5\' 3"  (1.6 m)  Weight: 217 lb (98.431 kg)  SpO2: 95%     Gen: Pleasant, overwt woman, in no distress,  normal affect  ENT: No lesions,  mouth clear,  oropharynx clear, no postnasal drip, some cushingoid features  Neck: No JVD, no stridor  Lungs: No use of accessory muscles, clear without rales or rhonchi, no wheeze  Cardiovascular: RRR, early systolic murmur best heard at the right sternal border, 1+ pitting edema lower extremities  Musculoskeletal: No deformities, no cyanosis or clubbing  Neuro: alert, non focal  Skin: Warm,  Some cat scratches on her right forearm                                                                                   Testing:  BAL fungal smear 1/8 >> negative, cx pending BAL AFB smear 1/8 >> negative, cx pending BAL cytology 1/8 >> negative Brushings cytology 1/8 >> negative TBBx's path 1/8 >> negative RF, ANA, ANCA, 1/8 >> all negative  IgE 1/7 >> 55 (normal)  Eosinophil count 1/7 >> 0.4 (normal)  Fungal hypersensitivity panel 1/7 >> all negative Quantiferon gold 1/7 >> negative HIV 1/7 >> negative   CBC    Component Value Date/Time   WBC 12.1* 11/26/2015 1349   RBC 3.11* 11/26/2015 1349   HGB 9.6* 11/26/2015 1349   HCT 28.4* 11/26/2015 1349   PLT 348.0 11/26/2015 1349   MCV 91.2 11/26/2015 1349   MCH 29.4 03/08/2014 0350   MCHC 33.7 11/26/2015 1349   RDW 14.8 11/26/2015 1349   LYMPHSABS 1.2 11/26/2015 1349   MONOABS 0.2 11/26/2015 1349   EOSABS 0.2 11/26/2015 1349   EOSABS 0.4 09/03/2013 1331   BASOSABS 0.1 11/26/2015 1349   BMP Latest Ref Rng 03/08/2014 03/06/2014 03/03/2014  Glucose 70 - 99 mg/dL 182(H) 139(H) 183(H)  BUN 6 - 23 mg/dL 9 16 15   Creatinine 0.50 - 1.10 mg/dL 0.49(L) 0.60 0.64  Sodium 137 - 147 mEq/L 136(L) 137 138   Potassium 3.7 - 5.3 mEq/L 4.1 4.6 4.2  Chloride 96 - 112 mEq/L 97 99 99  CO2 19 - 32 mEq/L 26 26 23   Calcium 8.4 - 10.5 mg/dL 8.9 8.7 9.4    CT chest 10/01/15 --  COMPARISON: Chest CT 06/08/2014.  FINDINGS: Mediastinum/Lymph Nodes: Heart size is normal. Small amount of pericardial fluid and/or thickening anteriorly. There may be a tiny focus of calcification in the anterior aspect of the pericardium as well (image 37 of series 4), but this is uncertain. No gross morphologic changes are noted in the heart at this time to strongly suggest constrictive physiology. There are numerous borderline enlarged mediastinal lymph nodes  which are nonspecific. No pathologically enlarged mediastinal or hilar lymph nodes. Please note that accurate exclusion of hilar adenopathy is limited on noncontrast CT scans. Esophagus is unremarkable in appearance. Numerous borderline enlarged and mildly enlarged axillary lymph nodes are noted bilaterally measuring up to 12 mm in short axis on the right side.  Lungs/Pleura: Postoperative changes of excisional lung biopsy are again noted in the superior segment of the left lower lobe. Several tiny pulmonary nodules are noted throughout the lungs bilaterally, unchanged in size, number and distribution compared to the prior study, the largest of which measures 4 mm in the left upper lobe. These are considered benign at this time. In addition, there is a chronic area of nodular architectural distortion in the apex of the left upper lobe (image 11 12 of series 5) which is unchanged, presumably an area of chronic scarring. No acute consolidative airspace disease. No pleural effusions. High-resolution images demonstrate no significant regions of ground-glass attenuation, subpleural reticulation, parenchymal banding, traction bronchiectasis or honeycombing. Inspiratory and expiratory imaging demonstrates some mild air trapping, indicative of small  airways disease.  Upper abdomen: Diffuse low attenuation throughout the hepatic parenchyma, compatible with hepatic steatosis. Status post cholecystectomy.  Musculoskeletal: There are no aggressive appearing lytic or blastic lesions noted in the visualized portions of the skeleton.  IMPRESSION: 1. There multiple tiny pulmonary nodules scattered throughout the lungs bilaterally which are unchanged, considered benign. Additional findings, including multiple borderline enlarged mediastinal lymph nodes are also similar to the prior examination. These findings could be a manifestation of sarcoidosis, but are highly nonspecific. 2. There are multiple borderline enlarged and minimally enlarged axillary lymph nodes bilaterally measuring up to 12 mm on the right side. Correlation with nonemergent mammography is suggested in the near future. 3. Mild air trapping, indicative of small airways disease. 4. Hepatic steatosis.       Assessment & Plan:  Sarcoidosis of lung To be overall fairly clinically stable with regard to exertional tolerance. Her CT scan of the chest from February was stable as well. She's been on methotrexate without labs and this needs to be checked now with better compliance in the future. Discussed this today. We will ensure that she not desaturating with exercise on 3 L/m.  Lower extremity weakness With some associated change in cognition and with falls. We will rule out exertional hypoxemia. I suspect that this is largely due to medications she has several on her list that we will affect cognition and may cause imbalance, dizziness, etc. I do believe we need to have her seen by neurology not only to evaluate this possible cause but especially to rule out any evidence for neurosarcoid.   Baltazar Apo, MD, PhD 02/22/2016, 12:19 PM [facility] [phone] or if no answer [phone]

## 2016-02-22 NOTE — Assessment & Plan Note (Signed)
With some associated change in cognition and with falls. We will rule out exertional hypoxemia. I suspect that this is largely due to medications she has several on her list that we will affect cognition and may cause imbalance, dizziness, etc. I do believe we need to have her seen by neurology not only to evaluate this possible cause but especially to rule out any evidence for neurosarcoid.

## 2016-02-24 MED FILL — METHOTREXATE 2.5 MG TABLET: 2.5 | 28 days supply | Qty: 24 | Fill #3

## 2016-02-24 MED FILL — PROMETHAZINE 25 MG TABLET: 25 | 22 days supply | Qty: 90 | Fill #2

## 2016-02-24 MED FILL — DULoxetine HCL 30 MG CPEP: 30 | 30 days supply | Qty: 30 | Fill #3

## 2016-02-24 MED FILL — tiZANidine HCL 4 MG TABS: 4 | 30 days supply | Qty: 90 | Fill #2

## 2016-02-24 MED FILL — FOLIC ACID 1 MG TABLET: 1 | 30 days supply | Qty: 30 | Fill #3

## 2016-02-25 ENCOUNTER — Encounter: Payer: Self-pay | Admitting: Emergency Medicine

## 2016-02-25 ENCOUNTER — Encounter: Payer: Self-pay | Admitting: *Deleted

## 2016-02-25 MED FILL — HYDROCODON-APAP 10-325: 10-325 | 20 days supply | Qty: 120 | Fill #0

## 2016-02-25 NOTE — Telephone Encounter (Signed)
Per pt email:  As you can see in epic my neurology referral won't be until 05/02/2016 and I was just wondering if it wouldn't be prudent to go ahead and get a head CT prior to my first visit with them? Not trying to get the cart before the horse but I'm sure one of the next steps would be a head CT....wouldn't it? Thanks!! --  Please advise RB thanks

## 2016-03-19 ENCOUNTER — Encounter: Payer: Self-pay | Admitting: Emergency Medicine

## 2016-03-19 MED FILL — raNITIdine HCL 150 MG TABS: 150 | 90 days supply | Qty: 180 | Fill #2

## 2016-03-19 MED FILL — ATORVASTATIN 10 MG TABLET: 10 | 90 days supply | Qty: 90 | Fill #3

## 2016-03-19 MED FILL — GABAPENTIN 300 MG CAPSULE: 300 | 90 days supply | Qty: 270 | Fill #3

## 2016-03-19 MED FILL — PROMETHAZINE 25 MG TABLET: 25 | 22 days supply | Qty: 90 | Fill #3

## 2016-03-19 MED FILL — LISINOPRIL 10 MG TABLET: 10 | 90 days supply | Qty: 90 | Fill #3

## 2016-03-20 MED FILL — DULoxetine HCL 30 MG CPEP: 30 | 30 days supply | Qty: 30 | Fill #4

## 2016-03-20 MED FILL — METHOTREXATE 2.5 MG TABLET: 2.5 | 28 days supply | Qty: 24 | Fill #4

## 2016-03-20 MED FILL — JANUVIA 100 MG TABLET: 100 | 90 days supply | Qty: 90 | Fill #3

## 2016-03-20 MED FILL — POTASSIUM CL ER 10 MEQ TAB: 10 | 90 days supply | Qty: 90 | Fill #3

## 2016-03-20 MED FILL — FUROSEMIDE 40 MG TABLET: 40 | 90 days supply | Qty: 180 | Fill #3

## 2016-03-20 MED FILL — FOLIC ACID 1 MG TABLET: 1 | 30 days supply | Qty: 30 | Fill #4

## 2016-03-20 NOTE — Telephone Encounter (Signed)
Per pt email: I sent a message back on 6/30 requesting that I might get a head CT prior to my neuro referral which isn't until 05/02/16. I was told that the info would be sent to Dr. Malvin Johns but I've not received an answer as yet. It's okay of the answer is no.Marland KitchenMarland KitchenMarland KitchenTruly! I was just hoping we could get that done since there's still so much "down time" in waiting for my appointment. Thanks!!  ---  Please advise Dr. Lamonte Sakai thanks

## 2016-04-04 MED FILL — tiZANidine HCL 4 MG TABS: 4 | 30 days supply | Qty: 90 | Fill #0

## 2016-04-12 ENCOUNTER — Encounter: Payer: Self-pay | Admitting: Emergency Medicine

## 2016-04-13 MED ORDER — FIRST-DUKES MOUTHWASH MT SUSP: OROMUCOSAL | 0 refills | Status: DC

## 2016-04-13 MED FILL — DULoxetine HCL 30 MG CPEP: 30 | 30 days supply | Qty: 30 | Fill #5

## 2016-04-13 MED FILL — METHOTREXATE 2.5 MG TABLET: 2.5 | 28 days supply | Qty: 24 | Fill #5

## 2016-04-13 MED FILL — MAGIC MOUTHWASH BOP FORM: 8 days supply | Qty: 120 | Fill #0

## 2016-04-13 MED FILL — FOLIC ACID 1 MG TABLET: 1 | 30 days supply | Qty: 30 | Fill #5

## 2016-04-24 ENCOUNTER — Encounter: Payer: Self-pay | Admitting: Emergency Medicine

## 2016-04-25 MED FILL — PROMETHAZINE 25 MG TABLET: 25 | 22 days supply | Qty: 90 | Fill #0

## 2016-04-28 ENCOUNTER — Other Ambulatory Visit (INDEPENDENT_AMBULATORY_CARE_PROVIDER_SITE_OTHER): Payer: BLUE CROSS/BLUE SHIELD

## 2016-04-28 DIAGNOSIS — D869 Sarcoidosis, unspecified: Secondary | ICD-10-CM | POA: Diagnosis not present

## 2016-04-28 LAB — HEPATIC FUNCTION PANEL
ALT: 9 U/L (ref 0–35)
AST: 10 U/L (ref 0–37)
Albumin: 3 g/dL — ABNORMAL LOW (ref 3.5–5.2)
Alkaline Phosphatase: 94 U/L (ref 39–117)
BILIRUBIN DIRECT: 0 mg/dL (ref 0.0–0.3)
BILIRUBIN TOTAL: 0.4 mg/dL (ref 0.2–1.2)
Total Protein: 6.4 g/dL (ref 6.0–8.3)

## 2016-04-28 LAB — CBC WITH DIFFERENTIAL/PLATELET
BASOS PCT: 0.9 % (ref 0.0–3.0)
Basophils Absolute: 0.1 10*3/uL (ref 0.0–0.1)
EOS PCT: 1.8 % (ref 0.0–5.0)
Eosinophils Absolute: 0.2 10*3/uL (ref 0.0–0.7)
HEMATOCRIT: 29.1 % — AB (ref 36.0–46.0)
HEMOGLOBIN: 9.9 g/dL — AB (ref 12.0–15.0)
LYMPHS PCT: 16.6 % (ref 12.0–46.0)
Lymphs Abs: 1.6 10*3/uL (ref 0.7–4.0)
MCHC: 34 g/dL (ref 30.0–36.0)
MCV: 90.2 fl (ref 78.0–100.0)
MONOS PCT: 4.9 % (ref 3.0–12.0)
Monocytes Absolute: 0.5 10*3/uL (ref 0.1–1.0)
NEUTROS ABS: 7.4 10*3/uL (ref 1.4–7.7)
Neutrophils Relative %: 75.8 % (ref 43.0–77.0)
PLATELETS: 361 10*3/uL (ref 150.0–400.0)
RBC: 3.22 Mil/uL — ABNORMAL LOW (ref 3.87–5.11)
RDW: 16.9 % — AB (ref 11.5–15.5)
WBC: 9.8 10*3/uL (ref 4.0–10.5)

## 2016-04-28 MED FILL — HYDROCODON-APAP 10-325: 10-325 | 20 days supply | Qty: 120 | Fill #0

## 2016-05-02 ENCOUNTER — Ambulatory Visit: Payer: BLUE CROSS/BLUE SHIELD | Admitting: Neurology

## 2016-05-02 MED FILL — tiZANidine HCL 4 MG TABS: 4 | 30 days supply | Qty: 90 | Fill #1

## 2016-05-04 ENCOUNTER — Encounter: Payer: Self-pay | Admitting: Emergency Medicine

## 2016-05-14 MED FILL — DULoxetine HCL 30 MG CPEP: 30 | 30 days supply | Qty: 30 | Fill #6

## 2016-05-15 MED FILL — PROMETHAZINE 25 MG TABLET: 25 | 22 days supply | Qty: 90 | Fill #1

## 2016-05-22 ENCOUNTER — Encounter: Payer: Self-pay | Admitting: Emergency Medicine

## 2016-05-23 MED ORDER — METHOTREXATE 2.5 MG PO TABS: ORAL_TABLET | ORAL | 5 refills | Status: DC

## 2016-05-23 MED FILL — METHOTREXATE 2.5 MG TABLET: 2.5 | 28 days supply | Qty: 24 | Fill #0

## 2016-05-25 ENCOUNTER — Other Ambulatory Visit (INDEPENDENT_AMBULATORY_CARE_PROVIDER_SITE_OTHER): Payer: BLUE CROSS/BLUE SHIELD

## 2016-05-25 DIAGNOSIS — D869 Sarcoidosis, unspecified: Secondary | ICD-10-CM

## 2016-05-25 LAB — CBC WITH DIFFERENTIAL/PLATELET
BASOS ABS: 0.1 10*3/uL (ref 0.0–0.1)
BASOS PCT: 0.5 % (ref 0.0–3.0)
EOS PCT: 1.7 % (ref 0.0–5.0)
Eosinophils Absolute: 0.2 10*3/uL (ref 0.0–0.7)
HEMATOCRIT: 30.2 % — AB (ref 36.0–46.0)
Hemoglobin: 10.2 g/dL — ABNORMAL LOW (ref 12.0–15.0)
LYMPHS ABS: 1.5 10*3/uL (ref 0.7–4.0)
LYMPHS PCT: 14.3 % (ref 12.0–46.0)
MCHC: 33.9 g/dL (ref 30.0–36.0)
MCV: 90.2 fl (ref 78.0–100.0)
MONOS PCT: 3.8 % (ref 3.0–12.0)
Monocytes Absolute: 0.4 10*3/uL (ref 0.1–1.0)
NEUTROS ABS: 8.3 10*3/uL — AB (ref 1.4–7.7)
Neutrophils Relative %: 79.7 % — ABNORMAL HIGH (ref 43.0–77.0)
PLATELETS: 404 10*3/uL — AB (ref 150.0–400.0)
RBC: 3.35 Mil/uL — ABNORMAL LOW (ref 3.87–5.11)
RDW: 16 % — ABNORMAL HIGH (ref 11.5–15.5)
WBC: 10.4 10*3/uL (ref 4.0–10.5)

## 2016-05-25 LAB — HEPATIC FUNCTION PANEL
ALK PHOS: 106 U/L (ref 39–117)
ALT: 10 U/L (ref 0–35)
AST: 13 U/L (ref 0–37)
Albumin: 2.9 g/dL — ABNORMAL LOW (ref 3.5–5.2)
BILIRUBIN DIRECT: 0 mg/dL (ref 0.0–0.3)
BILIRUBIN TOTAL: 0.5 mg/dL (ref 0.2–1.2)
TOTAL PROTEIN: 6.5 g/dL (ref 6.0–8.3)

## 2016-05-26 ENCOUNTER — Encounter: Payer: Self-pay | Admitting: Emergency Medicine

## 2016-05-29 MED FILL — tiZANidine HCL 4 MG TABS: 4 | 30 days supply | Qty: 90 | Fill #2

## 2016-05-29 NOTE — Telephone Encounter (Signed)
Patient already viewed lab results on MyChart.  Replied to the e-mail and pasted results in response to [employer].  Nothing further needed.

## 2016-06-03 ENCOUNTER — Encounter: Payer: Self-pay | Admitting: Emergency Medicine

## 2016-06-05 MED FILL — PROMETHAZINE 25 MG TABLET: 25 | 22 days supply | Qty: 90 | Fill #2

## 2016-06-06 MED FILL — DULoxetine HCL 30 MG CPEP: 30 | 30 days supply | Qty: 30 | Fill #7

## 2016-06-09 ENCOUNTER — Ambulatory Visit: Payer: BLUE CROSS/BLUE SHIELD | Admitting: Emergency Medicine

## 2016-06-15 MED FILL — ATORVASTATIN 10 MG TABLET: 10 | 90 days supply | Qty: 90 | Fill #0

## 2016-06-15 MED FILL — UNIFINE PENTIPS 31GX3/16: 31G X 5 MM | 90 days supply | Qty: 100 | Fill #0

## 2016-06-15 MED FILL — raNITIdine HCL 150 MG TABS: 150 | 90 days supply | Qty: 180 | Fill #0

## 2016-06-15 MED FILL — METHOTREXATE 2.5 MG TABLET: 2.5 | 28 days supply | Qty: 24 | Fill #1

## 2016-06-15 MED FILL — FUROSEMIDE 40 MG TABLET: 40 | 90 days supply | Qty: 180 | Fill #0

## 2016-06-15 MED FILL — POTASSIUM CL 10 MEQ TAB SA: 10 | 90 days supply | Qty: 90 | Fill #0

## 2016-06-15 MED FILL — ONE TOUCH DELICA 33G LANCET: 90 days supply | Qty: 200 | Fill #0

## 2016-06-15 MED FILL — NovoLOG 100 UNIT/ML SOLN: 100 | 90 days supply | Qty: 60 | Fill #0

## 2016-06-15 MED FILL — JANUVIA 100 MG TABLET: 100 | 90 days supply | Qty: 90 | Fill #0

## 2016-06-15 MED FILL — LISINOPRIL 10 MG TABLET: 10 | 90 days supply | Qty: 90 | Fill #0

## 2016-06-15 MED FILL — GABAPENTIN 300 MG CAPSULE: 300 | 90 days supply | Qty: 270 | Fill #0

## 2016-06-15 MED FILL — UNIFINE PENTIPS 31GX3/16": 31G X 5 MM | 90 days supply | Qty: 100 | Fill #0

## 2016-06-16 MED FILL — HYDROCODON-APAP 10-325: 10-325 | 15 days supply | Qty: 90 | Fill #0

## 2016-06-27 ENCOUNTER — Ambulatory Visit: Payer: BLUE CROSS/BLUE SHIELD | Admitting: Neurology

## 2016-06-28 ENCOUNTER — Ambulatory Visit: Payer: BLUE CROSS/BLUE SHIELD | Admitting: Endocrinology

## 2016-07-03 ENCOUNTER — Ambulatory Visit: Payer: BLUE CROSS/BLUE SHIELD | Admitting: Emergency Medicine

## 2016-07-06 MED FILL — tiZANidine HCL 4 MG TABS: 4 | 30 days supply | Qty: 90 | Fill #0

## 2016-07-06 MED FILL — SURE COMFORT 0.5 ML SYRINGE: 29G X 1/2" | 66 days supply | Qty: 200 | Fill #0

## 2016-07-06 MED FILL — AMLODIPINE BESYLATE 10 MG T: 10 | 30 days supply | Qty: 30 | Fill #0

## 2016-07-06 MED FILL — predniSONE 10 MG TABS: 10 | 30 days supply | Qty: 30 | Fill #0

## 2016-07-06 MED FILL — VENTOLIN HFA 90 MCG INHALER: 108 (90 BAS | 25 days supply | Qty: 18 | Fill #0

## 2016-07-06 MED FILL — PHENYTOIN SOD EXT 200 MG CA: 200 | 30 days supply | Qty: 60 | Fill #0

## 2016-07-06 MED FILL — LANTUS SOLOSTAR 100 UNITS/M: 100 | 56 days supply | Qty: 90 | Fill #0

## 2016-07-06 MED FILL — FOLIC ACID 1 MG TABLET: 1 | 30 days supply | Qty: 30 | Fill #0

## 2016-07-25 MED FILL — METHOTREXATE 2.5 MG TABLET: 2.5 | 28 days supply | Qty: 24 | Fill #2

## 2016-07-25 MED FILL — PROMETHAZINE 25 MG TABLET: 25 | 22 days supply | Qty: 90 | Fill #3

## 2016-07-28 ENCOUNTER — Ambulatory Visit (INDEPENDENT_AMBULATORY_CARE_PROVIDER_SITE_OTHER): Payer: BLUE CROSS/BLUE SHIELD | Admitting: Emergency Medicine

## 2016-07-28 ENCOUNTER — Encounter: Payer: Self-pay | Admitting: Emergency Medicine

## 2016-07-28 DIAGNOSIS — D860 Sarcoidosis of lung: Secondary | ICD-10-CM | POA: Diagnosis not present

## 2016-07-28 NOTE — Patient Instructions (Signed)
Please continue your methotrexate as you are taking it.  Continue your inhaled medications as you are taking them  Continue your oxygen as you are using it  Get your monthly labs We will consider further workup at your follow up visit in 3 months.

## 2016-07-28 NOTE — Progress Notes (Signed)
Subjective:    Patient ID: Marissa Wilkerson, female    DOB: [birth year], 55 y.o.   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 I saw her initially in consultation after admission to [facility] for hypoxemia 09/02/13. She had experienced dyspnea that has been insidious over about 3-4 weeks. She denied FCS or purulent sputum production. No recent URI or unusual exposures or travels. She was treated with HCTZ in 9/'14 by [facility] when she developed significant LE edema. TTE from 10/14 showed no evidence of PAH or LV dysfxn. Following her presentation to [facility]PA revealed Abnormal lungs with numerous indistinct bilateral pulmonary nodules (semi-solid appearing), with superimposed confluent mass like pulmonary opacity in the left lower lung crossing the major fissure. There was no PE.  We performed serologies and FOB during the admission. She was discharged to home on O2 with plan to f/u today. She feels some dyspnea w exertion. Continues to desat with exertion on RA.     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 We have been transitioning prednisone to methotrexate. She is now off prednisone for the last 7 weeks. She feels fewer side effects, same breathing.   ROV 4 /28/16 -- follow-up visit for sarcoidosis status post biopsy. She is on methotrexate, her current dose is 15mg  x 3 months. She is off pred and has lost 30 lbs. She is supposed to start aquatic therapy soon.  She notes that she has some desats w exertion on 3L/min to the 80s percent.   ROV 05/04/15 -- follow-up visit for history of biopsy-proven sarcoidosis. Her most recent CT scan of the chest was October 2015 that showed improved chronic granulomatous disease. She is being managed on methotrexate. Her last labs were at [facility] but her  last LFT were done with me. Her breathing is stable. She has noticed some nose bleeding over the last few week. She has also been having some emesis and nausea. She has gastroparesis on an emptying scan.  She has lost 10 more pounds since last time.   ROV1/17/17--[name] has a history of sarcoidosis documented by biopsy. Currently managed on methotrexate 15 mg weekly. No real clinical change since last time. She is trying to walk more, exert more. She has lost some wt - about 50lbs over 1 year.   ROV 02/22/16 -- patient follows up for history of biopsy-proven sarcoidosis.  On MTX 15mg  qweek. Her most recent CT scan of the chest was 10/01/15 which showed stable nodular disease, no significant change in interstitial findings, no bronchiectasis or Honeycombing present. She has not gotten her labs as she is supposed to - last was in March '17. She has had 2 falls, dizzy and then falls. No numbness or tingling but feels that she loses leg power. No syncope. She was started on cymbalta - this was the only medication change. Her memory has worsened some. Needs labs today. Wearing 3L/min. Dyspnea stable. She has noticed a caugh lately, some increased rhinorrhea   ROV 07/28/16 -- this follow-up visit for sarcoidosis. She has been managed on methotrexate 15 mg weekly. Last time in June she had some imbalance. Then in October she was found seizing (new). She was admitted to [facility] with acute encephalopathy. 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 ACE level in CSF was negative. She was tapered off steroids, no new neuro sx. Back on MTX now. She is having some UA noise, wheeze.     Review of Systems  Constitutional: Negative for activity change, fever and unexpected weight change.  HENT: Negative for congestion, dental problem, ear pain, nosebleeds, postnasal drip, rhinorrhea, sinus pressure, sneezing, sore throat and trouble swallowing.   Eyes:  Negative for redness and itching.  Respiratory: Positive for shortness of breath. Negative for cough, chest tightness and wheezing.   Cardiovascular: Negative for palpitations and leg swelling.  Gastrointestinal: Positive for vomiting. Negative for nausea.  Genitourinary: Negative for dysuria.  Musculoskeletal: Negative for back pain and joint swelling.  Skin: Negative for rash.  Neurological: Negative for headaches.  Hematological: Does not bruise/bleed easily.  Psychiatric/Behavioral: Negative for dysphoric mood. The patient is not nervous/anxious.        Objective:   Physical Exam Vitals:   07/28/16 1611 07/28/16 1612  BP:  (!) 124/56  Pulse:  82  SpO2:  99%  Weight: 242 lb (109.8 kg)   Height: 5\' 3"  (1.6 m)      Gen: Pleasant, overwt woman, in no distress,  normal affect  ENT: No lesions,  mouth clear,  oropharynx clear, no postnasal drip, some cushingoid features  Neck: No JVD, no stridor  Lungs: No use of accessory muscles, clear without rales or rhonchi, no wheeze  Cardiovascular: RRR, early systolic murmur best heard at the right sternal border, 1+ pitting edema lower extremities  Musculoskeletal: No deformities, no cyanosis or clubbing  Neuro: alert, non focal  Skin: Warm,  Some cat scratches on her right forearm                                                                                   Testing:  BAL fungal smear 1/8 >> negative, cx pending BAL AFB smear 1/8 >> negative, cx pending BAL cytology 1/8 >> negative Brushings cytology 1/8 >> negative TBBx's path 1/8 >> negative RF, ANA, ANCA, 1/8 >> all negative  IgE 1/7 >> 55 (normal)  Eosinophil count 1/7 >> 0.4 (normal)  Fungal hypersensitivity panel 1/7 >> all negative Quantiferon gold 1/7 >> negative HIV 1/7 >> negative   CBC    Component Value Date/Time   WBC 10.4 05/25/2016 1346   RBC 3.35 (L) 05/25/2016 1346   HGB 10.2 (L) 05/25/2016 1346   HCT 30.2 (L) 05/25/2016 1346   PLT 404.0 (H)  05/25/2016 1346   MCV 90.2 05/25/2016 1346   MCH 29.4 03/08/2014 0350   MCHC 33.9 05/25/2016 1346   RDW 16.0 (H) 05/25/2016 1346   LYMPHSABS 1.5 05/25/2016 1346   MONOABS 0.4 05/25/2016 1346   EOSABS 0.2 05/25/2016 1346   EOSABS 0.4 09/03/2013 1331   BASOSABS 0.1 05/25/2016 1346   BMP Latest Ref Rng & Units 03/08/2014 03/06/2014 03/03/2014  Glucose 70 - 99 mg/dL 182(H) 139(H) 183(H)  BUN 6 - 23 mg/dL 9 16 15   Creatinine 0.50 - 1.10 mg/dL 0.49(L) 0.60 0.64  Sodium 137 - 147 mEq/L 136(L) 137 138  Potassium 3.7 - 5.3 mEq/L 4.1 4.6 4.2  Chloride 96 - 112 mEq/L 97  99 99  CO2 19 - 32 mEq/L 26 26 23   Calcium 8.4 - 10.5 mg/dL 8.9 8.7 9.4    CT chest 10/01/15 --  COMPARISON: Chest CT 06/08/2014.  FINDINGS: Mediastinum/Lymph Nodes: Heart size is normal. Small amount of pericardial fluid and/or thickening anteriorly. There may be a tiny focus of calcification in the anterior aspect of the pericardium as well (image 37 of series 4), but this is uncertain. No gross morphologic changes are noted in the heart at this time to strongly suggest constrictive physiology. There are numerous borderline enlarged mediastinal lymph nodes which are nonspecific. No pathologically enlarged mediastinal or hilar lymph nodes. Please note that accurate exclusion of hilar adenopathy is limited on noncontrast CT scans. Esophagus is unremarkable in appearance. Numerous borderline enlarged and mildly enlarged axillary lymph nodes are noted bilaterally measuring up to 12 mm in short axis on the right side.  Lungs/Pleura: Postoperative changes of excisional lung biopsy are again noted in the superior segment of the left lower lobe. Several tiny pulmonary nodules are noted throughout the lungs bilaterally, unchanged in size, number and distribution compared to the prior study, the largest of which measures 4 mm in the left upper lobe. These are considered benign at this time. In addition, there is a chronic area  of nodular architectural distortion in the apex of the left upper lobe (image 11 12 of series 5) which is unchanged, presumably an area of chronic scarring. No acute consolidative airspace disease. No pleural effusions. High-resolution images demonstrate no significant regions of ground-glass attenuation, subpleural reticulation, parenchymal banding, traction bronchiectasis or honeycombing. Inspiratory and expiratory imaging demonstrates some mild air trapping, indicative of small airways disease.  Upper abdomen: Diffuse low attenuation throughout the hepatic parenchyma, compatible with hepatic steatosis. Status post cholecystectomy.  Musculoskeletal: There are no aggressive appearing lytic or blastic lesions noted in the visualized portions of the skeleton.  IMPRESSION: 1. There multiple tiny pulmonary nodules scattered throughout the lungs bilaterally which are unchanged, considered benign. Additional findings, including multiple borderline enlarged mediastinal lymph nodes are also similar to the prior examination. These findings could be a manifestation of sarcoidosis, but are highly nonspecific. 2. There are multiple borderline enlarged and minimally enlarged axillary lymph nodes bilaterally measuring up to 12 mm on the right side. Correlation with nonemergent mammography is suggested in the near future. 3. Mild air trapping, indicative of small airways disease. 4. Hepatic steatosis.       Assessment & Plan:  Sarcoidosis of lung Please continue your methotrexate as you are taking it.  Continue your inhaled medications as you are taking them  Continue your oxygen as you are using it  Get your monthly labs We will consider further workup at your follow up visit in 3 months.   Baltazar Apo, MD, PhD 07/28/2016, 5:12 PM [facility] [phone] or if no answer [phone]

## 2016-07-28 NOTE — Assessment & Plan Note (Signed)
Please continue your methotrexate as you are taking it.  Continue your inhaled medications as you are taking them  Continue your oxygen as you are using it  Get your monthly labs We will consider further workup at your follow up visit in 3 months.

## 2016-07-31 ENCOUNTER — Encounter: Payer: Self-pay | Admitting: Internal Medicine

## 2016-07-31 MED FILL — FOLIC ACID 1 MG TABLET: 1 | 30 days supply | Qty: 30 | Fill #1

## 2016-07-31 MED FILL — tiZANidine HCL 4 MG TABS: 4 | 30 days supply | Qty: 90 | Fill #1

## 2016-08-01 ENCOUNTER — Telehealth: Payer: Self-pay | Admitting: Cardiovascular Disease

## 2016-08-01 MED FILL — PHENYTOIN SOD EXT 200 MG CA: 200 | 30 days supply | Qty: 60 | Fill #0

## 2016-08-01 NOTE — Telephone Encounter (Signed)
Received records from [facility] for appointment on 08/08/16 with Dr Gwenlyn Found.  Records given to [facility] (medical records) for Dr Kennon Holter schedule on 08/08/16. lp

## 2016-08-04 MED FILL — HYDROCODON-APAP 10-325: 10-325 | 15 days supply | Qty: 90 | Fill #0

## 2016-08-08 ENCOUNTER — Ambulatory Visit (INDEPENDENT_AMBULATORY_CARE_PROVIDER_SITE_OTHER): Payer: BLUE CROSS/BLUE SHIELD | Admitting: Cardiovascular Disease

## 2016-08-08 ENCOUNTER — Encounter: Payer: Self-pay | Admitting: Cardiovascular Disease

## 2016-08-08 VITALS — BP 142/68 | HR 88 | Ht 63.0 in | Wt 236.0 lb

## 2016-08-08 DIAGNOSIS — I739 Peripheral vascular disease, unspecified: Secondary | ICD-10-CM

## 2016-08-08 DIAGNOSIS — I779 Disorder of arteries and arterioles, unspecified: Secondary | ICD-10-CM | POA: Diagnosis not present

## 2016-08-08 DIAGNOSIS — E785 Hyperlipidemia, unspecified: Secondary | ICD-10-CM | POA: Insufficient documentation

## 2016-08-08 DIAGNOSIS — I10 Essential (primary) hypertension: Secondary | ICD-10-CM | POA: Diagnosis not present

## 2016-08-08 NOTE — Assessment & Plan Note (Signed)
History of pulmonary sarcoidosis on methotrexate and steroids in the past now on continuous home oxygen.

## 2016-08-08 NOTE — Progress Notes (Signed)
08/08/2016 [facility]   1961/02/19  [id number]  Primary Physician Kandice Hams, MD Primary Cardiologist: Lorretta Harp MD Renae Gloss  HPI:  Ms Bruso is a delightful 55 year old mildly overweight married Caucasian female no children who is accompanied by her husband Fulton Reek.  She was referred by Dr. Maudie Mercury, her PCP, for evaluation of moderate bilateral carotid artery stenosis. I took care of both her parents, Ruthann Cancer and Bunnie Domino in the past. She has a history of hypertension, diabetes and hyperlipidemia all medically treated. 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 She did does have 15-20 pack years of tobacco abuse having quit 4 years ago. She's never had a protection stroke. She has occasional atypical chest pain are chronically short of breath. She currently had admission at [facility] for seizure activity encephalopathy 06/18/16 where workup revealed bilateral moderate ICA stenosis.   Current Outpatient Prescriptions  Medication Sig Dispense Refill  . albuterol (PROVENTIL HFA;VENTOLIN HFA) 108 (90 BASE) MCG/ACT inhaler Inhale 2 puffs into the lungs every 6 (six) hours as needed for wheezing or shortness of breath. 1 Inhaler 6  . atorvastatin (LIPITOR) 10 MG tablet Take 10 mg by mouth daily at 3 pm.     . diphenhydrAMINE (BENADRYL) 25 mg capsule Take 25-50 mg by mouth at bedtime as needed for sleep.     . folic acid (FOLVITE) 1 MG tablet Take 1 tablet (1 mg total) by mouth daily. 30 tablet 5  . furosemide (LASIX) 40 MG tablet Take 80 mg by mouth daily at 3 pm.     . gabapentin (NEURONTIN) 300 MG capsule Take 300mg  in AM and 600mg  in PM    . HYDROcodone-acetaminophen (NORCO) 7.5-325 MG tablet Take 1 tablet by mouth every 6 (six) hours as needed for moderate pain.    [name] insulin aspart (NOVOLOG) 100 UNIT/ML injection Inject 20-40 Units into the skin 2 (two) times daily  before a meal. Sliding scale    . insulin glargine (LANTUS) 100 UNIT/ML injection Inject 20 Units into the skin 2 (two) times daily.     [name] lisinopril (PRINIVIL,ZESTRIL) 10 MG tablet Take 10 mg by mouth daily.    . methotrexate (RHEUMATREX) 2.5 MG tablet TAKE 6 TABLETS BY MOUTH ONCE A WEEK ON MONDAYS. 24 tablet 5  . phenytoin (DILANTIN) 200 MG ER capsule Take 200 mg by mouth 2 (two) times daily.    . potassium chloride (K-DUR,KLOR-CON) 10 MEQ tablet Take 1 tablet (10 mEq total) by mouth daily. (Patient taking differently: Take 10 mEq by mouth daily at 3 pm. ) 30 tablet 0  . promethazine (PHENERGAN) 25 MG tablet Take 25 mg by mouth every 4 (four) hours as needed for nausea or vomiting.    . sitaGLIPtin (JANUVIA) 100 MG tablet Take 100 mg by mouth daily.     [name] tiZANidine (ZANAFLEX) 4 MG tablet Take 4 mg by mouth every 8 (eight) hours as needed for muscle spasms.   0   No current facility-administered medications for this visit.     Allergies  Allergen Reactions  . Fentanyl Other (See Comments)    Patches. Blistered skin.   . Metformin And Related Nausea And Vomiting  . Mobic [Meloxicam] Nausea And Vomiting  . Sulfa Antibiotics Rash  . Voltaren [Diclofenac] Nausea And Vomiting    Social History   Social History  . Marital status: Married  Spouse name: N/A  . Number of children: N/A  . Years of education: N/A   Occupational History  . Not on file.   Social History Main Topics  . Smoking status: Former Smoker    Packs/day: 0.50    Years: 25.00    Types: Cigarettes    Quit date: 08/21/2012  . Smokeless tobacco: Never Used  . Alcohol use Yes     Comment: very rare  . Drug use: No  . Sexual activity: Not Currently   Other Topics Concern  . Not on file   Social History Narrative  . No narrative on file     Review of Systems: General: negative for chills, fever, night sweats or weight changes.  Cardiovascular: negative for chest pain, dyspnea on exertion, edema,  orthopnea, palpitations, paroxysmal nocturnal dyspnea or shortness of breath Dermatological: negative for rash Respiratory: negative for cough or wheezing Urologic: negative for hematuria Abdominal: negative for nausea, vomiting, diarrhea, bright red blood per rectum, melena, or hematemesis Neurologic: negative for visual changes, syncope, or dizziness All other systems reviewed and are otherwise negative except as noted above.    Blood pressure (!) 142/68, pulse 88, height 5\' 3"  (1.6 m), weight 236 lb (107 kg), last menstrual period 08/28/2008.  General appearance: alert and no distress Neck: no adenopathy, no JVD, supple, symmetrical, trachea midline, thyroid not enlarged, symmetric, no tenderness/mass/nodules and Soft bilateral carotid bruits Lungs: no adenopathy, no JVD, supple, symmetrical, trachea midline, thyroid not enlarged, symmetric, no tenderness/mass/nodules and Soft bilateral carotid bruits Heart: regular rate and rhythm, S1, S2 normal, no murmur, click, rub or gallop Extremities: 2-3+ pitting edema bilaterally  EKG sinus rhythm at 88 with reverse R-wave progression. I personally reviewed this EKG  ASSESSMENT AND PLAN:   Hypertension history of hypertension blood pressures pressures 142/68. She is on lisinopril. Continue current meds at current dosing  Diastolic dysfunction History of diastolic heart failure with echo performed 01/21/15 revealing EF of 65-70% with grade 1 diastolic dysfunction.  Hyperlipidemia History of hyperlipidemia on statin therapy followed by her PCP  Sarcoidosis of lung ([place]) History of pulmonary sarcoidosis on methotrexate and steroids in the past now on continuous home oxygen.  Bilateral carotid artery disease ([place]) This Kinch recently had an episode of seizure activity with encephalopathy at [facility] where workup revealed bilateral moderate internal carotid artery stenosis. We will get baseline carotid Doppler  studies.      Lorretta Harp MD FACP,FACC,FAHA, [facility] 08/08/2016 12:06 PM

## 2016-08-08 NOTE — Assessment & Plan Note (Signed)
History of diastolic heart failure with echo performed 01/21/15 revealing EF of 65-70% with grade 1 diastolic dysfunction.

## 2016-08-08 NOTE — Patient Instructions (Signed)
Medication Instructions: Your physician recommends that you continue on your current medications as directed. Please refer to the Current Medication list given to you today.  Testing/Procedures: Your physician has requested that you have a carotid duplex. This test is an ultrasound of the carotid arteries in your neck. It looks at blood flow through these arteries that supply the brain with blood. Allow one hour for this exam. There are no restrictions or special instructions.  Follow-Up: Your physician wants you to follow-up in: 1 year with Dr. Berry. You will receive a reminder letter in the mail two months in advance. If you don't receive a letter, please call our office to schedule the follow-up appointment.  If you need a refill on your cardiac medications before your next appointment, please call your pharmacy.  

## 2016-08-08 NOTE — Assessment & Plan Note (Signed)
history of hypertension blood pressures pressures 142/68. She is on lisinopril. Continue current meds at current dosing

## 2016-08-08 NOTE — Assessment & Plan Note (Signed)
This Skilton recently had an episode of seizure activity with encephalopathy at [facility] where workup revealed bilateral moderate internal carotid artery stenosis. We will get baseline carotid Doppler studies.

## 2016-08-08 NOTE — Assessment & Plan Note (Signed)
History of hyperlipidemia on statin therapy followed by her PCP. 

## 2016-08-15 ENCOUNTER — Ambulatory Visit (HOSPITAL_COMMUNITY)
Admission: RE | Admit: 2016-08-15 | Discharge: 2016-08-15 | Disposition: A | Discharge status: Home / Self Care | Payer: BLUE CROSS/BLUE SHIELD | Source: Ambulatory Visit | Attending: Cardiology | Admitting: Cardiology

## 2016-08-15 DIAGNOSIS — I6523 Occlusion and stenosis of bilateral carotid arteries: Secondary | ICD-10-CM | POA: Diagnosis not present

## 2016-08-15 DIAGNOSIS — I739 Peripheral vascular disease, unspecified: Secondary | ICD-10-CM

## 2016-08-15 DIAGNOSIS — I779 Disorder of arteries and arterioles, unspecified: Secondary | ICD-10-CM

## 2016-08-15 DIAGNOSIS — I10 Essential (primary) hypertension: Secondary | ICD-10-CM

## 2016-08-15 DIAGNOSIS — E785 Hyperlipidemia, unspecified: Secondary | ICD-10-CM | POA: Diagnosis not present

## 2016-08-15 DIAGNOSIS — Z87891 Personal history of nicotine dependence: Secondary | ICD-10-CM | POA: Diagnosis not present

## 2016-08-15 DIAGNOSIS — E119 Type 2 diabetes mellitus without complications: Secondary | ICD-10-CM | POA: Diagnosis not present

## 2016-08-17 ENCOUNTER — Other Ambulatory Visit: Payer: Self-pay | Admitting: Internal Medicine

## 2016-08-17 DIAGNOSIS — R569 Unspecified convulsions: Secondary | ICD-10-CM

## 2016-08-17 DIAGNOSIS — G9341 Metabolic encephalopathy: Secondary | ICD-10-CM

## 2016-08-23 ENCOUNTER — Telehealth: Payer: Self-pay | Admitting: *Deleted

## 2016-08-23 NOTE — Telephone Encounter (Signed)
Left message for pt to call.

## 2016-08-23 NOTE — Telephone Encounter (Signed)
-----   Message from Lorretta Harp, MD sent at 08/18/2016  4:56 PM EST ----- Mild right ICA stenosis. Repeat 12 months

## 2016-08-24 MED FILL — PHENYTOIN SOD EXT 200 MG CA: 200 | 30 days supply | Qty: 60 | Fill #1

## 2016-08-24 MED FILL — tiZANidine HCL 4 MG TABS: 4 | 30 days supply | Qty: 90 | Fill #2

## 2016-08-24 MED FILL — FOLIC ACID 1 MG TABLET: 1 | 30 days supply | Qty: 30 | Fill #2

## 2016-08-24 MED FILL — METHOTREXATE 2.5 MG TABLET: 2.5 | 28 days supply | Qty: 24 | Fill #3

## 2016-08-30 ENCOUNTER — Ambulatory Visit
Admission: RE | Admit: 2016-08-30 | Discharge: 2016-08-30 | Disposition: A | Discharge status: Home / Self Care | Payer: BLUE CROSS/BLUE SHIELD | Source: Ambulatory Visit | Attending: Internal Medicine | Admitting: Internal Medicine

## 2016-08-30 DIAGNOSIS — G9341 Metabolic encephalopathy: Secondary | ICD-10-CM

## 2016-08-30 DIAGNOSIS — R569 Unspecified convulsions: Secondary | ICD-10-CM

## 2016-09-01 ENCOUNTER — Other Ambulatory Visit: Payer: Self-pay | Admitting: Cardiovascular Disease

## 2016-09-01 DIAGNOSIS — I739 Peripheral vascular disease, unspecified: Principal | ICD-10-CM

## 2016-09-01 DIAGNOSIS — I779 Disorder of arteries and arterioles, unspecified: Secondary | ICD-10-CM

## 2016-09-04 MED FILL — HYDROCODON-APAP 10-325: 10-325 | 15 days supply | Qty: 90 | Fill #0

## 2016-09-05 NOTE — Telephone Encounter (Signed)
Results released to mychart

## 2016-09-26 MED FILL — FUROSEMIDE 40 MG TABLET: 40 | 90 days supply | Qty: 180 | Fill #1

## 2016-09-26 MED FILL — LANTUS SOLOSTAR 100 UNITS/M: 100 | 56 days supply | Qty: 90 | Fill #1

## 2016-09-26 MED FILL — FOLIC ACID 1 MG TABLET: 1 | 30 days supply | Qty: 30 | Fill #3

## 2016-09-26 MED FILL — ATORVASTATIN 10 MG TABLET: 10 | 90 days supply | Qty: 90 | Fill #1

## 2016-09-26 MED FILL — NovoLOG 100 UNIT/ML SOLN: 100 | 90 days supply | Qty: 60 | Fill #1

## 2016-09-26 MED FILL — PROMETHAZINE 25 MG TABLET: 25 | 30 days supply | Qty: 60 | Fill #0

## 2016-09-26 MED FILL — SURE COMFORT 0.5 ML SYRINGE: 29G X 1/2" | 66 days supply | Qty: 200 | Fill #1

## 2016-09-26 MED FILL — METHOTREXATE 2.5 MG TABLET: 2.5 | 28 days supply | Qty: 24 | Fill #4

## 2016-09-26 MED FILL — PHENYTOIN SOD EXT 200 MG CA: 200 | 30 days supply | Qty: 60 | Fill #2

## 2016-09-26 MED FILL — UNIFINE PENTIPS 31GX3/16": 31G X 5 MM | 90 days supply | Qty: 100 | Fill #1

## 2016-09-26 MED FILL — ONE TOUCH DELICA 33G LANCET: 90 days supply | Qty: 200 | Fill #1

## 2016-09-26 MED FILL — UNIFINE PENTIPS 31GX3/16: 31G X 5 MM | 90 days supply | Qty: 100 | Fill #1

## 2016-09-26 MED FILL — LISINOPRIL 10 MG TABLET: 10 | 90 days supply | Qty: 90 | Fill #1

## 2016-09-26 MED FILL — GABAPENTIN 300 MG CAPSULE: 300 | 90 days supply | Qty: 270 | Fill #1

## 2016-09-26 MED FILL — JANUVIA 100 MG TABLET: 100 | 90 days supply | Qty: 90 | Fill #1

## 2016-09-29 MED FILL — tiZANidine HCL 4 MG TABS: 4 | 30 days supply | Qty: 90 | Fill #0

## 2016-10-03 IMAGING — CT CT CHEST HIGH RESOLUTION W/O CM
2 of 5 series · 14 of 36 positions shown, 17 images · non-contrast
Comparison: Chest CT 06/08/2014.

CLINICAL DATA: 54-year-old female with clinical history of
sarcoidosis. On methotrexate therapy for 1 year.

EXAM:
CT CHEST WITHOUT CONTRAST
TECHNIQUE: Multidetector CT imaging of the chest was performed following the
standard protocol without intravenous contrast. High resolution
imaging of the lungs, as well as inspiratory and expiratory imaging,
was performed.

[Series 4: high resolution · axial · 0.67mm/px · z∈[-218,+38]mm · 11 of 59 slices shown, 14 images]
[im 4/59  mediastinal]
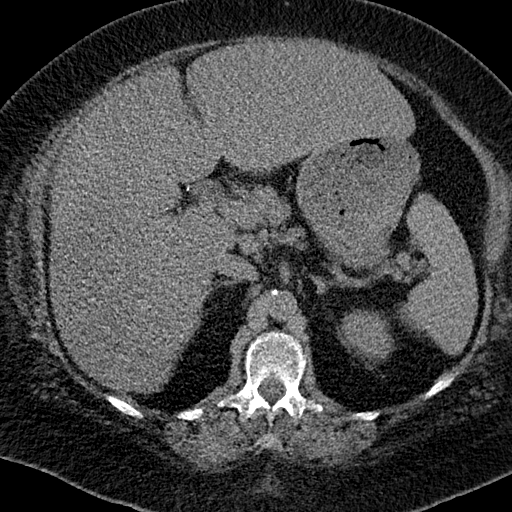
[im 4/59  lung]
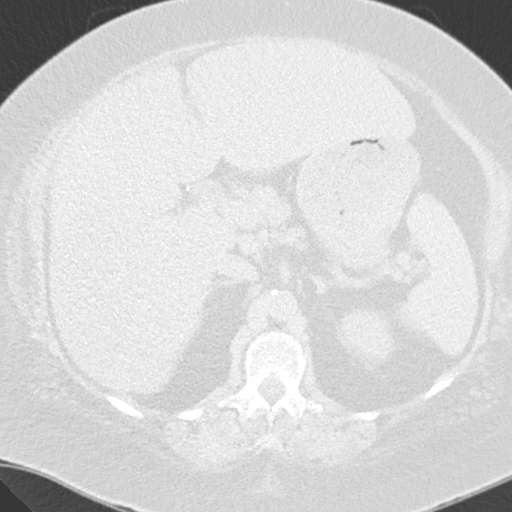
[im 10/59  lung]
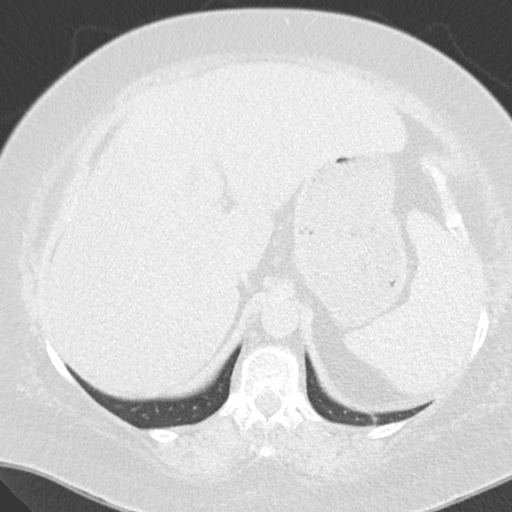
[im 13/59  lung]
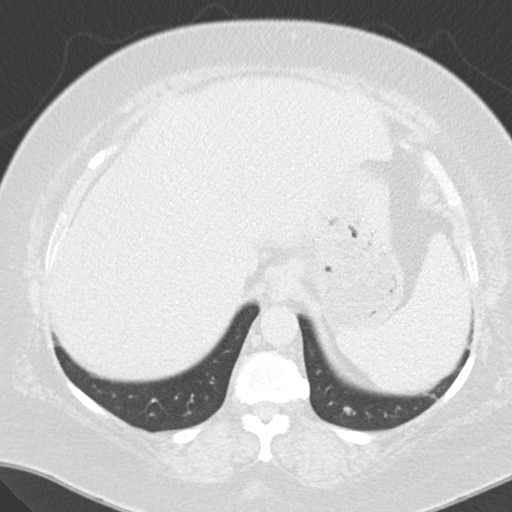
[im 20/59  lung]
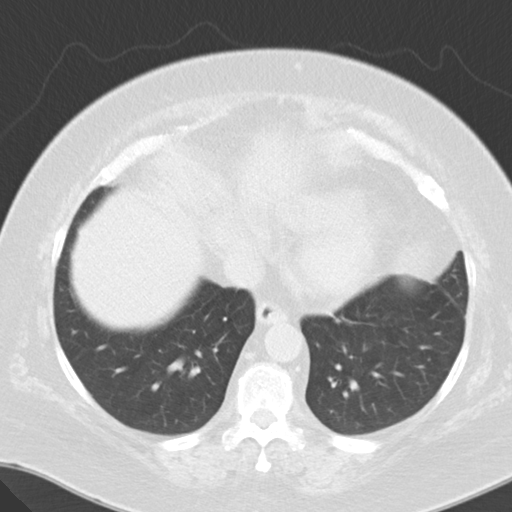
[im 23/59  mediastinal]
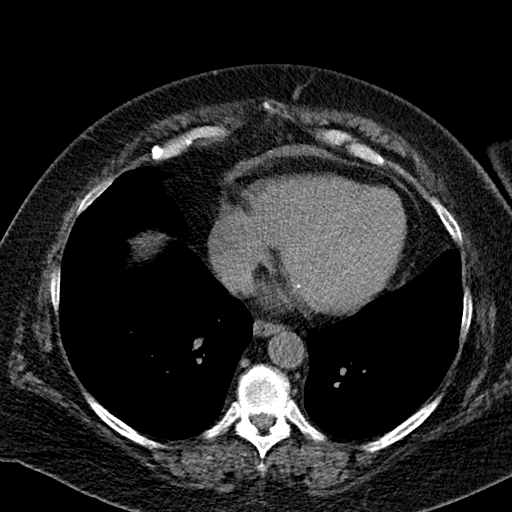
[im 23/59  lung]
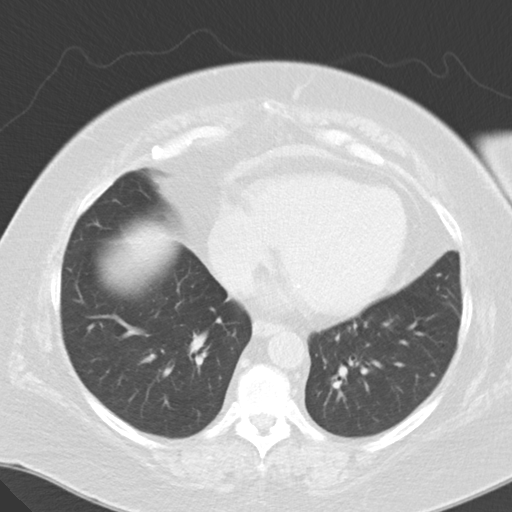
[im 30/59  lung]
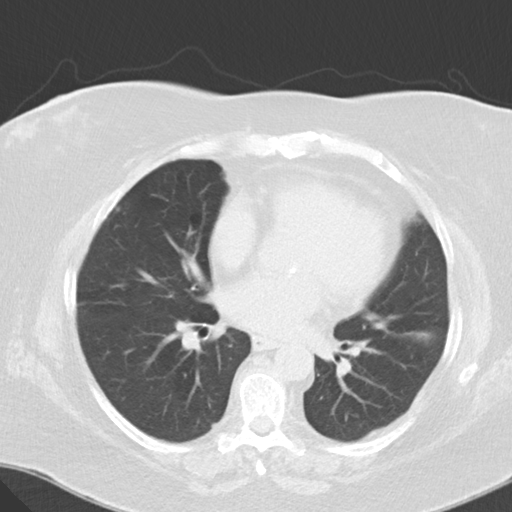
[im 36/59  lung]
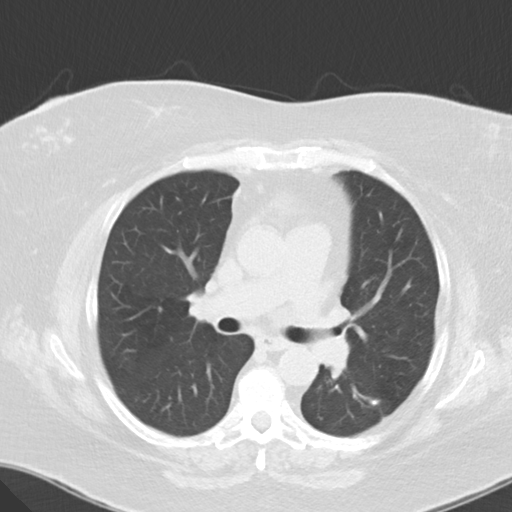
[im 39/59  lung]
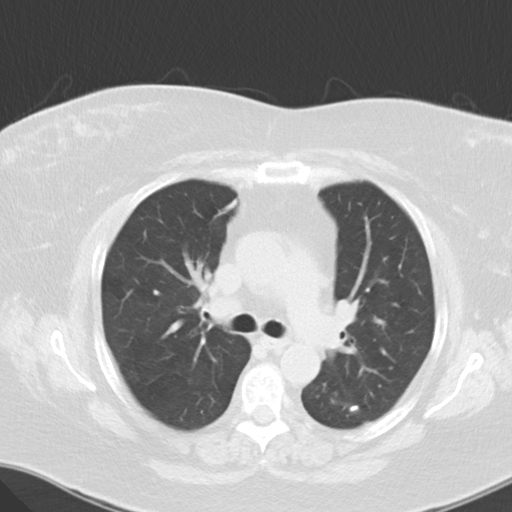
[im 46/59  mediastinal]
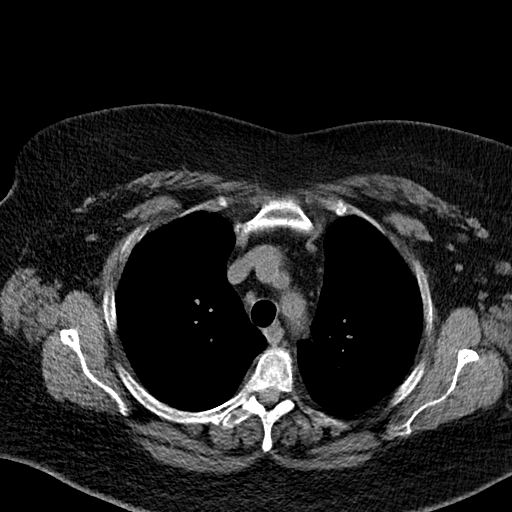
[im 46/59  lung]
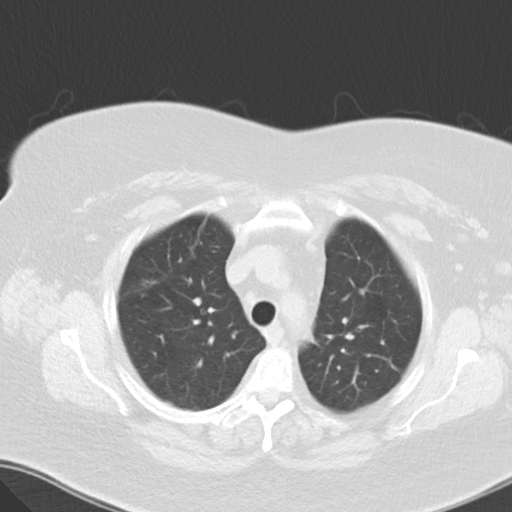
[im 49/59  lung]
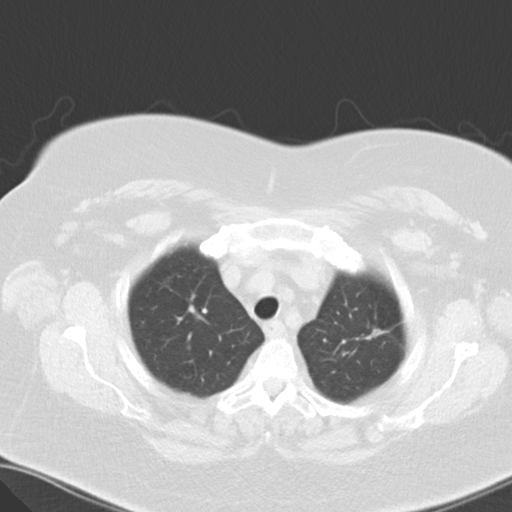
[im 55/59  lung]
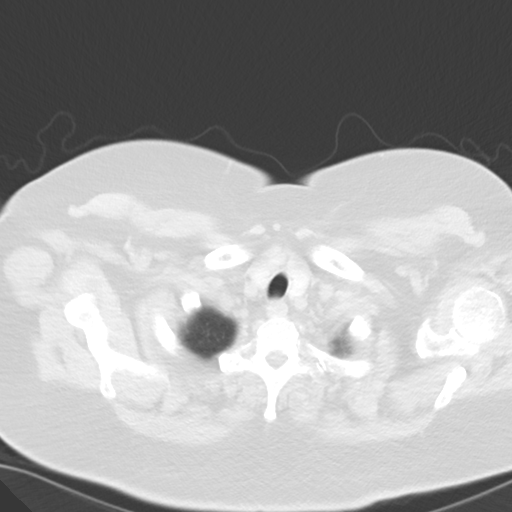

[Series 8: coronal · coronal · 0.59mm/px · 3 of 152 slices shown]
[im 31/152  lung]
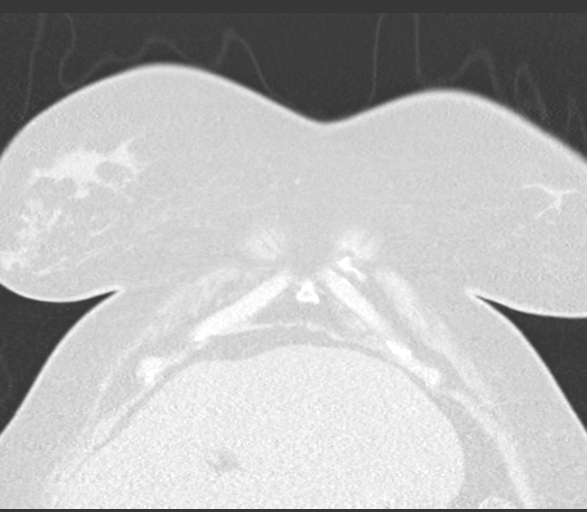
[im 61/152  lung]
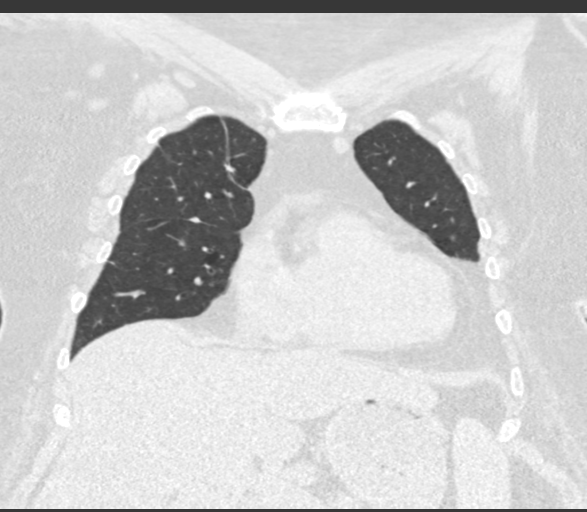
[im 91/152  lung]
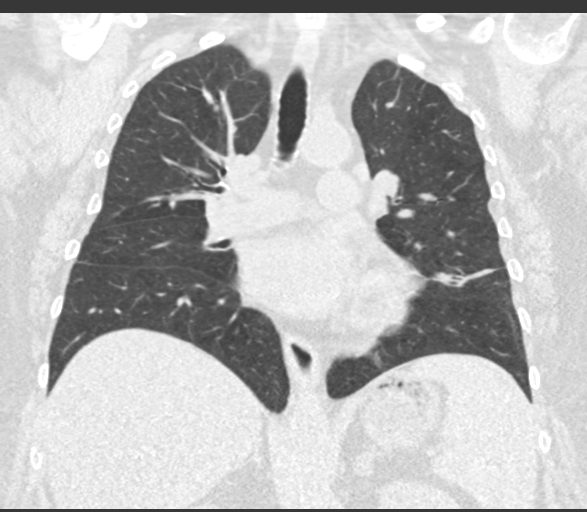

[14 of 36 positions shown; findings below may reference images not displayed]

FINDINGS: Mediastinum/Lymph Nodes: Heart size is normal. Small amount of
pericardial fluid and/or thickening anteriorly. There may be a tiny
focus of calcification in the anterior aspect of the pericardium as
well (image 37 of series 4), but this is uncertain. No gross
morphologic changes are noted in the heart at this time to strongly
suggest constrictive physiology. There are numerous borderline
enlarged mediastinal lymph nodes which are nonspecific. No
pathologically enlarged mediastinal or hilar lymph nodes. Please
note that accurate exclusion of hilar adenopathy is limited on
noncontrast CT scans. Esophagus is unremarkable in appearance.
Numerous borderline enlarged and mildly enlarged axillary lymph
nodes are noted bilaterally measuring up to 12 mm in short axis on
the right side.

Lungs/Pleura: Postoperative changes of excisional lung biopsy are
again noted in the superior segment of the left lower lobe. Several
tiny pulmonary nodules are noted throughout the lungs bilaterally,
unchanged in size, number and distribution compared to the prior
study, the largest of which measures 4 mm in the left upper lobe.
These are considered benign at this time. In addition, there is a
chronic area of nodular architectural distortion in the apex of the
left upper lobe (image 11 12 of series 5) which is unchanged,
presumably an area of chronic scarring. No acute consolidative
airspace disease. No pleural effusions. High-resolution images
demonstrate no significant regions of ground-glass attenuation,
subpleural reticulation, parenchymal banding, traction
bronchiectasis or honeycombing. Inspiratory and expiratory imaging
demonstrates some mild air trapping, indicative of small airways
disease.

Upper abdomen: Diffuse low attenuation throughout the hepatic
parenchyma, compatible with hepatic steatosis. Status post
cholecystectomy.

Musculoskeletal: There are no aggressive appearing lytic or blastic
lesions noted in the visualized portions of the skeleton.
IMPRESSION: 1. There multiple tiny pulmonary nodules scattered throughout the
lungs bilaterally which are unchanged, considered benign. Additional
findings, including multiple borderline enlarged mediastinal lymph
nodes are also similar to the prior examination. These findings
could be a manifestation of sarcoidosis, but are highly nonspecific.
2. There are multiple borderline enlarged and minimally enlarged
axillary lymph nodes bilaterally measuring up to 12 mm on the right
side. Correlation with nonemergent mammography is suggested in the
near future.
3. Mild air trapping, indicative of small airways disease.
4. Hepatic steatosis.

## 2016-10-03 MED FILL — HYDROCODON-APAP 10-325: 10-325 | 15 days supply | Qty: 90 | Fill #0

## 2016-10-16 ENCOUNTER — Other Ambulatory Visit: Payer: Self-pay | Admitting: Rehabilitation

## 2016-10-16 DIAGNOSIS — S46092A Other injury of muscle(s) and tendon(s) of the rotator cuff of left shoulder, initial encounter: Secondary | ICD-10-CM

## 2016-10-17 ENCOUNTER — Telehealth: Payer: Self-pay | Admitting: *Deleted

## 2016-10-17 ENCOUNTER — Other Ambulatory Visit (INDEPENDENT_AMBULATORY_CARE_PROVIDER_SITE_OTHER): Payer: BLUE CROSS/BLUE SHIELD

## 2016-10-17 DIAGNOSIS — D860 Sarcoidosis of lung: Secondary | ICD-10-CM

## 2016-10-17 LAB — HEPATIC FUNCTION PANEL
ALBUMIN: 3.2 g/dL — AB (ref 3.5–5.2)
ALK PHOS: 96 U/L (ref 39–117)
ALT: 13 U/L (ref 0–35)
AST: 13 U/L (ref 0–37)
Bilirubin, Direct: 0.1 mg/dL (ref 0.0–0.3)
TOTAL PROTEIN: 6.7 g/dL (ref 6.0–8.3)
Total Bilirubin: 0.2 mg/dL (ref 0.2–1.2)

## 2016-10-17 LAB — CBC WITH DIFFERENTIAL/PLATELET
Basophils Absolute: 0.1 10*3/uL (ref 0.0–0.1)
Basophils Relative: 0.8 % (ref 0.0–3.0)
Eosinophils Absolute: 0.2 10*3/uL (ref 0.0–0.7)
Eosinophils Relative: 2.7 % (ref 0.0–5.0)
HEMATOCRIT: 28.7 % — AB (ref 36.0–46.0)
HEMOGLOBIN: 10 g/dL — AB (ref 12.0–15.0)
LYMPHS PCT: 15 % (ref 12.0–46.0)
Lymphs Abs: 1.3 10*3/uL (ref 0.7–4.0)
MCHC: 34.8 g/dL (ref 30.0–36.0)
MCV: 88.5 fl (ref 78.0–100.0)
MONOS PCT: 9.1 % (ref 3.0–12.0)
Monocytes Absolute: 0.8 10*3/uL (ref 0.1–1.0)
Neutro Abs: 6.1 10*3/uL (ref 1.4–7.7)
Neutrophils Relative %: 72.4 % (ref 43.0–77.0)
Platelets: 248 10*3/uL (ref 150.0–400.0)
RBC: 3.25 Mil/uL — AB (ref 3.87–5.11)
RDW: 14.5 % (ref 11.5–15.5)
WBC: 8.4 10*3/uL (ref 4.0–10.5)

## 2016-10-17 NOTE — Telephone Encounter (Signed)
Received call from Cassie in the lab Pt there now for standing labs - CBCD and Hepatic Per 12.1.17 ov w/ RB:  Patient Instructions  Please continue your methotrexate as you are taking it.  Continue your inhaled medications as you are taking them  Continue your oxygen as you are using it  Get your monthly labs We will consider further workup at your follow up visit in 3 months.    Monthly standing orders placed for CBCD and Hepatic x6 months Will sign off

## 2016-10-25 MED FILL — ATORVASTATIN 20 MG TABLET: 20 | 90 days supply | Qty: 90 | Fill #0

## 2016-10-27 ENCOUNTER — Ambulatory Visit: Payer: BLUE CROSS/BLUE SHIELD | Admitting: Emergency Medicine

## 2016-10-30 MED FILL — PROMETHAZINE 25 MG TABLET: 25 | 30 days supply | Qty: 60 | Fill #1

## 2016-10-30 MED FILL — FOLIC ACID 1 MG TABLET: 1 | 30 days supply | Qty: 30 | Fill #4

## 2016-10-30 MED FILL — PHENYTOIN SOD EXT 200 MG CA: 200 | 30 days supply | Qty: 60 | Fill #3

## 2016-10-30 MED FILL — METHOTREXATE 2.5 MG TABLET: 2.5 | 28 days supply | Qty: 24 | Fill #5

## 2016-10-30 MED FILL — tiZANidine HCL 4 MG TABS: 4 | 30 days supply | Qty: 90 | Fill #1

## 2016-10-31 ENCOUNTER — Ambulatory Visit
Admission: RE | Admit: 2016-10-31 | Discharge: 2016-10-31 | Disposition: A | Discharge status: Home / Self Care | Payer: BLUE CROSS/BLUE SHIELD | Source: Ambulatory Visit | Attending: Rehabilitation | Admitting: Rehabilitation

## 2016-10-31 DIAGNOSIS — S46092A Other injury of muscle(s) and tendon(s) of the rotator cuff of left shoulder, initial encounter: Secondary | ICD-10-CM

## 2016-10-31 MED ORDER — IOPAMIDOL (ISOVUE-M 200) INJECTION 41%
1.0000 mL | Freq: Once | INTRAMUSCULAR | Status: AC
  Administered 2016-10-31: 1 mL via INTRA_ARTICULAR

## 2016-10-31 MED FILL — HYDROCODON-APAP 10-325: 10-325 | 15 days supply | Qty: 90 | Fill #0

## 2016-11-27 ENCOUNTER — Encounter: Payer: Self-pay | Admitting: Emergency Medicine

## 2016-11-27 MED ORDER — METHOTREXATE 2.5 MG PO TABS: ORAL_TABLET | ORAL | 5 refills | Status: DC

## 2016-11-27 MED FILL — METHOTREXATE 2.5 MG TABLET: 2.5 | 28 days supply | Qty: 24 | Fill #0

## 2016-11-29 MED FILL — PROMETHAZINE 25 MG TABLET: 25 | 30 days supply | Qty: 60 | Fill #2

## 2016-11-29 MED FILL — SURE COMFORT 0.5 ML SYRINGE: 29G X 1/2" | 66 days supply | Qty: 200 | Fill #2

## 2016-11-29 MED FILL — PHENYTOIN SOD EXT 200 MG CA: 200 | 30 days supply | Qty: 60 | Fill #4

## 2016-11-29 MED FILL — POTASSIUM CL 10 MEQ TAB SA: 10 | 90 days supply | Qty: 90 | Fill #1

## 2016-11-29 MED FILL — LANTUS SOLOSTAR 100 UNITS/M: 100 | 56 days supply | Qty: 90 | Fill #2

## 2016-11-29 MED FILL — FOLIC ACID 1 MG TABLET: 1 | 30 days supply | Qty: 30 | Fill #5

## 2016-11-29 MED FILL — tiZANidine HCL 4 MG TABS: 4 | 30 days supply | Qty: 90 | Fill #2

## 2016-11-30 MED FILL — HYDROCODON-APAP 10-325: 10-325 | 15 days supply | Qty: 90 | Fill #0

## 2016-12-19 ENCOUNTER — Ambulatory Visit: Payer: BLUE CROSS/BLUE SHIELD | Admitting: Emergency Medicine

## 2016-12-23 MED FILL — JANUVIA 100 MG TABLET: 100 | 90 days supply | Qty: 90 | Fill #2

## 2016-12-23 MED FILL — METHOTREXATE 2.5 MG TABLET: 2.5 | 28 days supply | Qty: 24 | Fill #1

## 2016-12-23 MED FILL — GABAPENTIN 300 MG CAPSULE: 300 | 90 days supply | Qty: 270 | Fill #2

## 2016-12-25 MED FILL — FUROSEMIDE 40 MG TABLET: 40 | 90 days supply | Qty: 180 | Fill #2

## 2016-12-25 MED FILL — LISINOPRIL 10 MG TABLET: 10 | 90 days supply | Qty: 90 | Fill #2

## 2016-12-25 MED FILL — PROMETHAZINE 25 MG TABLET: 25 | 30 days supply | Qty: 60 | Fill #3

## 2016-12-25 MED FILL — PHENYTOIN SOD EXT 200 MG CA: 200 | 30 days supply | Qty: 60 | Fill #5

## 2016-12-25 MED FILL — FOLIC ACID 1 MG TABLET: 1 | 30 days supply | Qty: 30 | Fill #6

## 2016-12-29 MED FILL — HYDROCODON-APAP 10-325: 10-325 | 20 days supply | Qty: 120 | Fill #0

## 2017-01-18 MED FILL — UNIFINE PENTIPS 31GX3/16": 31G X 5 MM | 90 days supply | Qty: 100 | Fill #2

## 2017-01-18 MED FILL — FOLIC ACID 1 MG TABLET: 1 | 30 days supply | Qty: 30 | Fill #7

## 2017-01-18 MED FILL — tiZANidine HCL 4 MG TABS: 4 | 30 days supply | Qty: 90 | Fill #0

## 2017-01-18 MED FILL — PHENYTOIN SOD EXT 200 MG CA: 200 | 30 days supply | Qty: 60 | Fill #6

## 2017-01-18 MED FILL — UNIFINE PENTIPS 31GX3/16: 31G X 5 MM | 90 days supply | Qty: 100 | Fill #2

## 2017-01-18 MED FILL — ONE TOUCH DELICA 33G LANCET: 90 days supply | Qty: 200 | Fill #2

## 2017-01-18 MED FILL — PROMETHAZINE 25 MG TABLET: 25 | 30 days supply | Qty: 60 | Fill #4

## 2017-01-18 MED FILL — METHOTREXATE 2.5 MG TABLET: 2.5 | 28 days supply | Qty: 24 | Fill #2

## 2017-01-18 MED FILL — ONE TOUCH ULTRA TEST STRIPS: 90 days supply | Qty: 300 | Fill #0

## 2017-01-30 ENCOUNTER — Ambulatory Visit: Payer: BLUE CROSS/BLUE SHIELD | Admitting: Emergency Medicine

## 2017-01-30 MED FILL — HYDROCODON-APAP 10-325: 10-325 | 20 days supply | Qty: 120 | Fill #0

## 2017-02-12 MED FILL — PHENYTOIN SOD EXT 200 MG CA: 200 | 30 days supply | Qty: 60 | Fill #7

## 2017-02-12 MED FILL — PROMETHAZINE 25 MG TABLET: 25 | 30 days supply | Qty: 60 | Fill #5

## 2017-02-12 MED FILL — tiZANidine HCL 4 MG TABS: 4 | 30 days supply | Qty: 90 | Fill #1

## 2017-02-12 MED FILL — METHOTREXATE 2.5 MG TABLET: 2.5 | 28 days supply | Qty: 24 | Fill #3

## 2017-02-12 MED FILL — FOLIC ACID 1 MG TABLET: 1 | 30 days supply | Qty: 30 | Fill #8

## 2017-03-09 MED FILL — HYDROCODON-APAP 10-325: 10-325 | 20 days supply | Qty: 120 | Fill #0

## 2017-03-09 MED FILL — SURE COMFORT 0.5 ML SYRINGE: 29G X 1/2" | 66 days supply | Qty: 200 | Fill #3

## 2017-03-09 MED FILL — POTASSIUM CL 10 MEQ TAB SA: 10 | 90 days supply | Qty: 90 | Fill #2

## 2017-03-12 MED FILL — FOLIC ACID 1 MG TABLET: 1 | 30 days supply | Qty: 30 | Fill #9

## 2017-03-12 MED FILL — ATORVASTATIN 20 MG TABLET: 20 | 90 days supply | Qty: 90 | Fill #1

## 2017-03-12 MED FILL — PHENYTOIN SOD EXT 200 MG CA: 200 | 30 days supply | Qty: 60 | Fill #8

## 2017-03-12 MED FILL — tiZANidine HCL 4 MG TABS: 4 | 30 days supply | Qty: 90 | Fill #2

## 2017-03-12 MED FILL — PROMETHAZINE 25 MG TABLET: 25 | 30 days supply | Qty: 60 | Fill #6

## 2017-03-12 MED FILL — METHOTREXATE 2.5 MG TABLET: 2.5 | 28 days supply | Qty: 24 | Fill #4

## 2017-04-02 MED FILL — METHOTREXATE 2.5 MG TABLET: 2.5 | 28 days supply | Qty: 24 | Fill #5

## 2017-04-02 MED FILL — GABAPENTIN 300 MG CAPSULE: 300 | 90 days supply | Qty: 270 | Fill #3

## 2017-04-02 MED FILL — LISINOPRIL 10 MG TABLET: 10 | 90 days supply | Qty: 90 | Fill #3

## 2017-04-02 MED FILL — JANUVIA 100 MG TABLET: 100 | 90 days supply | Qty: 90 | Fill #3

## 2017-04-02 MED FILL — FUROSEMIDE 40 MG TAB: 40 | 90 days supply | Qty: 180 | Fill #3

## 2017-04-09 MED FILL — HYDROCODON-APAP 10-325: 10-325 | 20 days supply | Qty: 120 | Fill #0

## 2017-04-10 MED FILL — PHENYTOIN SOD EXT 200 MG CA: 200 | 30 days supply | Qty: 60 | Fill #9

## 2017-04-10 MED FILL — FOLIC ACID 1 MG TABLET: 1 | 30 days supply | Qty: 30 | Fill #10

## 2017-04-12 MED FILL — PROMETHAZINE 25 MG TABLET: 25 | 30 days supply | Qty: 60 | Fill #0

## 2017-04-26 ENCOUNTER — Ambulatory Visit: Payer: BLUE CROSS/BLUE SHIELD | Admitting: Emergency Medicine

## 2017-05-05 MED FILL — ONE TOUCH ULTRA TEST STRIPS: 90 days supply | Qty: 300 | Fill #1

## 2017-05-05 MED FILL — UNIFINE PENTIPS 31GX3/16: 31G X 5 MM | 40 days supply | Qty: 200 | Fill #3

## 2017-05-05 MED FILL — LANTUS SOLOSTAR 100 UNITS/M: 100 | 56 days supply | Qty: 90 | Fill #3

## 2017-05-05 MED FILL — UNIFINE PENTIPS 31GX3/16": 31G X 5 MM | 40 days supply | Qty: 200 | Fill #3

## 2017-05-07 MED FILL — tiZANidine HCL 4 MG TABS: 4 | 30 days supply | Qty: 90 | Fill #0

## 2017-05-07 MED FILL — NovoLOG 100 UNIT/ML SOLN: 100 | 90 days supply | Qty: 60 | Fill #2

## 2017-05-07 MED FILL — PHENYTOIN SOD EXT 200 MG CA: 200 | 30 days supply | Qty: 60 | Fill #10

## 2017-05-07 MED FILL — PROMETHAZINE 25 MG TABLET: 25 | 30 days supply | Qty: 60 | Fill #1

## 2017-05-07 MED FILL — FOLIC ACID 1 MG TABLET: 1 | 30 days supply | Qty: 30 | Fill #11

## 2017-05-11 MED FILL — HYDROCODON-APAP 10-325: 10-325 | 20 days supply | Qty: 120 | Fill #0

## 2017-05-21 ENCOUNTER — Other Ambulatory Visit: Payer: Self-pay | Admitting: Emergency Medicine

## 2017-05-21 MED FILL — METHOTREXATE 2.5 MG TABLET: 2.5 | 28 days supply | Qty: 24 | Fill #0

## 2017-05-21 NOTE — Telephone Encounter (Signed)
Pt has an appt on 10/5 with RB. Please advise on refill.

## 2017-05-22 NOTE — Telephone Encounter (Signed)
Methotrexate was already refilled on 05/21/17. Pt has a pending appt on 06/01/2017 with RB. ATC pt, VM is full. Pt needs to be sure to keep appt on 06/01/2017.

## 2017-05-23 NOTE — Telephone Encounter (Signed)
Spoke with pt's husband. He is aware that pt's medication has been refilled. Reminded him of pt's appointment. Nothing further was needed.

## 2017-06-01 ENCOUNTER — Encounter: Payer: Self-pay | Admitting: Emergency Medicine

## 2017-06-01 ENCOUNTER — Ambulatory Visit (INDEPENDENT_AMBULATORY_CARE_PROVIDER_SITE_OTHER): Payer: BLUE CROSS/BLUE SHIELD | Admitting: Emergency Medicine

## 2017-06-01 ENCOUNTER — Other Ambulatory Visit (INDEPENDENT_AMBULATORY_CARE_PROVIDER_SITE_OTHER): Payer: BLUE CROSS/BLUE SHIELD

## 2017-06-01 VITALS — BP 120/76 | HR 79 | Ht 63.0 in | Wt 293.0 lb

## 2017-06-01 DIAGNOSIS — R569 Unspecified convulsions: Secondary | ICD-10-CM

## 2017-06-01 DIAGNOSIS — D869 Sarcoidosis, unspecified: Secondary | ICD-10-CM | POA: Diagnosis not present

## 2017-06-01 DIAGNOSIS — D860 Sarcoidosis of lung: Secondary | ICD-10-CM

## 2017-06-01 LAB — BASIC METABOLIC PANEL
BUN: 24 mg/dL — AB (ref 6–23)
CO2: 29 mEq/L (ref 19–32)
Calcium: 8.5 mg/dL (ref 8.4–10.5)
Chloride: 103 mEq/L (ref 96–112)
Creatinine, Ser: 0.91 mg/dL (ref 0.40–1.20)
GFR: 68.21 mL/min (ref >60.00)
Glucose, Bld: 144 mg/dL — ABNORMAL HIGH (ref 70–99)
POTASSIUM: 5.8 meq/L — AB (ref 3.5–5.1)
SODIUM: 136 meq/L (ref 135–145)

## 2017-06-01 LAB — CBC WITH DIFFERENTIAL/PLATELET
BASOS PCT: 1 % (ref 0.0–3.0)
Basophils Absolute: 0.1 10*3/uL (ref 0.0–0.1)
EOS ABS: 0.2 10*3/uL (ref 0.0–0.7)
Eosinophils Relative: 2.9 % (ref 0.0–5.0)
HEMATOCRIT: 26.2 % — AB (ref 36.0–46.0)
Hemoglobin: 8.7 g/dL — ABNORMAL LOW (ref 12.0–15.0)
Lymphocytes Relative: 17.7 % (ref 12.0–46.0)
Lymphs Abs: 1.2 10*3/uL (ref 0.7–4.0)
MCHC: 33.3 g/dL (ref 30.0–36.0)
MCV: 101.5 fl — ABNORMAL HIGH (ref 78.0–100.0)
Monocytes Absolute: 0.6 10*3/uL (ref 0.1–1.0)
Monocytes Relative: 8.1 % (ref 3.0–12.0)
NEUTROS ABS: 5 10*3/uL (ref 1.4–7.7)
Neutrophils Relative %: 70.3 % (ref 43.0–77.0)
PLATELETS: 213 10*3/uL (ref 150.0–400.0)
RBC: 2.58 Mil/uL — ABNORMAL LOW (ref 3.87–5.11)
RDW: 14.4 % (ref 11.5–15.5)
WBC: 7 10*3/uL (ref 4.0–10.5)

## 2017-06-01 LAB — HEPATIC FUNCTION PANEL
ALT: 10 U/L (ref 0–35)
AST: 12 U/L (ref 0–37)
Albumin: 3 g/dL — ABNORMAL LOW (ref 3.5–5.2)
Alkaline Phosphatase: 106 U/L (ref 39–117)
BILIRUBIN TOTAL: 0.2 mg/dL (ref 0.2–1.2)
Bilirubin, Direct: 0 mg/dL (ref 0.0–0.3)
Total Protein: 6.4 g/dL (ref 6.0–8.3)

## 2017-06-01 NOTE — Progress Notes (Signed)
Subjective:    Patient ID: Marissa Wilkerson, female    DOB: 1961/01/25, 56 y.o.   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 I saw her initially in consultation after admission to [facility] for hypoxemia 09/02/13. She had experienced dyspnea that has been insidious over about 3-4 weeks. She denied FCS or purulent sputum production. No recent URI or unusual exposures or travels. She was treated with HCTZ in 9/'14 by Dr Delfina Redwood when she developed significant LE edema. TTE from 10/14 showed no evidence of PAH or LV dysfxn. Following her presentation to [facility] CT-PA revealed Abnormal lungs with numerous indistinct bilateral pulmonary nodules (semi-solid appearing), with superimposed confluent mass like pulmonary opacity in the left lower lung crossing the major fissure. There was no PE.  We performed serologies and FOB during the admission. She was discharged to home on O2 with plan to f/u today. She feels some dyspnea w exertion. Continues to desat with exertion on RA.    ROV 07/28/16 -- this follow-up visit for sarcoidosis. She has been managed on methotrexate 15 mg weekly. Last time in June she had some imbalance. Then in October she was found seizing (new). She was admitted to [facility] with acute encephalopathy. 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 ACE level in CSF was negative. She was tapered off steroids, no new neuro sx. Back on MTX now. She is having some UA noise, wheeze.   ROV 06/01/17 -- This is a follow-up visit for patient with a history of tobacco use, diabetes, hypertension and pulmonary sarcoidosis. Also note a remote history of Reiter's syndrome, toxoplasmosis. As above at her last visit she had been dealing with new onset seizures, seizure free now. Needs a new Neurologist. Currently managed on methotrexate 15mg  daily.  She tells me that her dyspnea is worse, she has gained 50 lbs in the last year. She is using pulsed O2 at 3-4L/min.                                      Review of Systems  Constitutional: Negative for activity change, fever and unexpected weight change.  HENT: Negative for congestion, dental problem, ear pain, nosebleeds, postnasal drip, rhinorrhea, sinus pressure, sneezing, sore throat and trouble swallowing.   Eyes: Negative for redness and itching.  Respiratory: Positive for shortness of breath. Negative for cough, chest tightness and wheezing.   Cardiovascular: Negative for palpitations and leg swelling.  Gastrointestinal: Positive for vomiting. Negative for nausea.  Genitourinary: Negative for dysuria.  Musculoskeletal: Negative for back pain and joint swelling.  Skin: Negative for rash.  Neurological: Negative for headaches.  Hematological: Does not bruise/bleed easily.  Psychiatric/Behavioral: Negative for dysphoric mood. The patient is not nervous/anxious.        Objective:   Physical Exam Vitals:   06/01/17 1624 06/01/17 1625  BP:  120/76  Pulse:  79  SpO2:  95%  Weight: 293 lb (132.9 kg)   Height: 5\' 3"  (1.6 m)      Gen: Pleasant, overwt woman, in no distress,  normal affect  ENT: No lesions,  mouth clear,  oropharynx clear, no postnasal drip, some cushingoid features  Neck: No JVD, no stridor  Lungs: No use of accessory muscles, clear without rales or rhonchi,  no wheeze  Cardiovascular: RRR, early systolic murmur best heard at the right sternal border, 2+ pitting edema lower extremities  Musculoskeletal: No deformities, no cyanosis or clubbing  Neuro: alert, non focal  Skin: Warm,  No rash                                                                                 Testing:  BAL fungal smear 1/8 >> negative, cx pending BAL AFB smear 1/8 >> negative, cx pending BAL cytology 1/8 >> negative Brushings cytology 1/8 >> negative TBBx's path 1/8 >>  negative RF, ANA, ANCA, 1/8 >> all negative  IgE 1/7 >> 55 (normal)  Eosinophil count 1/7 >> 0.4 (normal)  Fungal hypersensitivity panel 1/7 >> all negative Quantiferon gold 1/7 >> negative HIV 1/7 >> negative   CBC    Component Value Date/Time   WBC 8.4 10/17/2016 1445   RBC 3.25 (L) 10/17/2016 1445   HGB 10.0 (L) 10/17/2016 1445   HCT 28.7 (L) 10/17/2016 1445   PLT 248.0 10/17/2016 1445   MCV 88.5 10/17/2016 1445   MCH 29.4 03/08/2014 0350   MCHC 34.8 10/17/2016 1445   RDW 14.5 10/17/2016 1445   LYMPHSABS 1.3 10/17/2016 1445   MONOABS 0.8 10/17/2016 1445   EOSABS 0.2 10/17/2016 1445   EOSABS 0.4 09/03/2013 1331   BASOSABS 0.1 10/17/2016 1445   BMP Latest Ref Rng & Units 03/08/2014 03/06/2014 03/03/2014  Glucose 70 - 99 mg/dL 182(H) 139(H) 183(H)  BUN 6 - 23 mg/dL 9 16 15   Creatinine 0.50 - 1.10 mg/dL 0.49(L) 0.60 0.64  Sodium 137 - 147 mEq/L 136(L) 137 138  Potassium 3.7 - 5.3 mEq/L 4.1 4.6 4.2  Chloride 96 - 112 mEq/L 97 99 99  CO2 19 - 32 mEq/L 26 26 23   Calcium 8.4 - 10.5 mg/dL 8.9 8.7 9.4    CT chest 10/01/15 --  COMPARISON: Chest CT 06/08/2014.  FINDINGS: Mediastinum/Lymph Nodes: Heart size is normal. Small amount of pericardial fluid and/or thickening anteriorly. There may be a tiny focus of calcification in the anterior aspect of the pericardium as well (image 37 of series 4), but this is uncertain. No gross morphologic changes are noted in the heart at this time to strongly suggest constrictive physiology. There are numerous borderline enlarged mediastinal lymph nodes which are nonspecific. No pathologically enlarged mediastinal or hilar lymph nodes. Please note that accurate exclusion of hilar adenopathy is limited on noncontrast CT scans. Esophagus is unremarkable in appearance. Numerous borderline enlarged and mildly enlarged axillary lymph nodes are noted bilaterally measuring up to 12 mm in short axis on the right side.  Lungs/Pleura: Postoperative  changes of excisional lung biopsy are again noted in the superior segment of the left lower lobe. Several tiny pulmonary nodules are noted throughout the lungs bilaterally, unchanged in size, number and distribution compared to the prior study, the largest of which measures 4 mm in the left upper lobe. These are considered benign at this time. In addition, there is a chronic area of nodular architectural distortion in the apex of the left upper lobe (image 11 12 of series 5) which is unchanged, presumably an area of chronic scarring. No acute consolidative  airspace disease. No pleural effusions. High-resolution images demonstrate no significant regions of ground-glass attenuation, subpleural reticulation, parenchymal banding, traction bronchiectasis or honeycombing. Inspiratory and expiratory imaging demonstrates some mild air trapping, indicative of small airways disease.  Upper abdomen: Diffuse low attenuation throughout the hepatic parenchyma, compatible with hepatic steatosis. Status post cholecystectomy.  Musculoskeletal: There are no aggressive appearing lytic or blastic lesions noted in the visualized portions of the skeleton.  IMPRESSION: 1. There multiple tiny pulmonary nodules scattered throughout the lungs bilaterally which are unchanged, considered benign. Additional findings, including multiple borderline enlarged mediastinal lymph nodes are also similar to the prior examination. These findings could be a manifestation of sarcoidosis, but are highly nonspecific. 2. There are multiple borderline enlarged and minimally enlarged axillary lymph nodes bilaterally measuring up to 12 mm on the right side. Correlation with nonemergent mammography is suggested in the near future. 3. Mild air trapping, indicative of small airways disease. 4. Hepatic steatosis.       Assessment & Plan:  Sarcoidosis of lung ([place]) We will perform lab work today We will repeat your CT  scan of the chest We will plan to stop methotrexate as of now. Stop folate also.  Continue your oxygen as you are using it Take albuterol 2 puffs up to every 4 hours if needed for shortness of breath.  Depending on you symptoms and your imaging we may decide to start an alternative to methotrexate Follow with Dr Lamonte Sakai next available to review your CT scan  Baltazar Apo, MD, PhD 06/01/2017, 5:07 PM [facility] [phone] or if no answer [phone]

## 2017-06-01 NOTE — Patient Instructions (Addendum)
We will perform lab work today We will repeat your CT scan of the chest We will plan to stop methotrexate as of now. Stop folate also.  Continue your oxygen as you are using it Take albuterol 2 puffs up to every 4 hours if needed for shortness of breath.  Depending on you symptoms and your imaging we may decide to start an alternative to methotrexate Follow with Dr Lamonte Sakai next available to review your CT scan

## 2017-06-01 NOTE — Assessment & Plan Note (Signed)
We will perform lab work today We will repeat your CT scan of the chest We will plan to stop methotrexate as of now. Stop folate also.  Continue your oxygen as you are using it Take albuterol 2 puffs up to every 4 hours if needed for shortness of breath.  Depending on you symptoms and your imaging we may decide to start an alternative to methotrexate Follow with Dr Lamonte Sakai next available to review your CT scan

## 2017-06-05 ENCOUNTER — Encounter: Payer: Self-pay | Admitting: Neurology

## 2017-06-11 MED FILL — UNIFINE PENTIPS 31GX3/16": 31G X 5 MM | 40 days supply | Qty: 200 | Fill #4

## 2017-06-12 ENCOUNTER — Other Ambulatory Visit: Payer: BLUE CROSS/BLUE SHIELD

## 2017-06-12 MED FILL — UNIFINE PENTIPS 31GX3/16: 31G X 5 MM | 40 days supply | Qty: 200 | Fill #4

## 2017-06-12 MED FILL — tiZANidine HCL 4 MG TABS: 4 | 30 days supply | Qty: 90 | Fill #1

## 2017-06-12 MED FILL — PROMETHAZINE 25 MG TABLET: 25 | 30 days supply | Qty: 60 | Fill #2

## 2017-06-12 MED FILL — PHENYTOIN SOD EXT 200 MG CA: 200 | 30 days supply | Qty: 60 | Fill #11

## 2017-06-12 MED FILL — ONE TOUCH DELICA 33G LANCET: 90 days supply | Qty: 200 | Fill #3

## 2017-06-12 MED FILL — HYDROCODON-APAP 10-325: 10-325 | 20 days supply | Qty: 120 | Fill #0

## 2017-06-12 MED FILL — POTASSIUM CL ER 10 MEQ TABL: 10 | 90 days supply | Qty: 90 | Fill #3

## 2017-06-12 MED FILL — ATORVASTATIN 20 MG TABLET: 20 | 90 days supply | Qty: 90 | Fill #2

## 2017-06-13 ENCOUNTER — Ambulatory Visit (INDEPENDENT_AMBULATORY_CARE_PROVIDER_SITE_OTHER)
Admission: RE | Admit: 2017-06-13 | Discharge: 2017-06-13 | Disposition: A | Discharge status: Home / Self Care | Payer: BLUE CROSS/BLUE SHIELD | Source: Ambulatory Visit | Attending: Emergency Medicine | Admitting: Emergency Medicine

## 2017-06-13 DIAGNOSIS — D860 Sarcoidosis of lung: Secondary | ICD-10-CM

## 2017-06-13 DIAGNOSIS — D869 Sarcoidosis, unspecified: Secondary | ICD-10-CM | POA: Diagnosis not present

## 2017-06-13 MED ORDER — IOPAMIDOL (ISOVUE-300) INJECTION 61%
80.0000 mL | Freq: Once | INTRAVENOUS | Status: AC | PRN
  Administered 2017-06-13: 80 mL via INTRAVENOUS

## 2017-06-20 ENCOUNTER — Ambulatory Visit: Payer: BLUE CROSS/BLUE SHIELD | Admitting: Emergency Medicine

## 2017-06-28 ENCOUNTER — Ambulatory Visit: Payer: BLUE CROSS/BLUE SHIELD | Admitting: Emergency Medicine

## 2017-07-05 MED FILL — tiZANidine HCL 4 MG TABS: 4 | 30 days supply | Qty: 90 | Fill #2

## 2017-07-09 MED FILL — TOUJEO SOLOSTAR 300 UNITS/M: 300 | 90 days supply | Qty: 15 | Fill #0

## 2017-07-09 MED FILL — JANUVIA 100 MG TABLET: 100 | 90 days supply | Qty: 90 | Fill #0

## 2017-07-09 MED FILL — NOVOLOG FLEXPEN SYRINGE: 100 | 30 days supply | Qty: 15 | Fill #0

## 2017-07-09 MED FILL — PROMETHAZINE 25 MG TABLET: 25 | 90 days supply | Qty: 360 | Fill #0

## 2017-07-09 MED FILL — PHENYTOIN SOD EXT 200 MG CA: 200 | 90 days supply | Qty: 180 | Fill #0

## 2017-07-09 MED FILL — GABAPENTIN 300 MG CAPSULE: 300 | 30 days supply | Qty: 180 | Fill #0

## 2017-07-09 MED FILL — FUROSEMIDE 40 MG TABS: 40 | 90 days supply | Qty: 180 | Fill #0

## 2017-07-09 MED FILL — LISINOPRIL 10 MG TABS: 10 | 90 days supply | Qty: 90 | Fill #0

## 2017-07-24 MED FILL — HYDROCODON-APAP 10-325: 10-325 | 20 days supply | Qty: 120 | Fill #0

## 2017-07-26 ENCOUNTER — Ambulatory Visit: Payer: BLUE CROSS/BLUE SHIELD | Admitting: Emergency Medicine

## 2017-08-10 ENCOUNTER — Ambulatory Visit: Payer: BLUE CROSS/BLUE SHIELD | Admitting: Emergency Medicine

## 2017-08-15 MED FILL — ONE TOUCH ULTRA TEST STRIPS: 90 days supply | Qty: 300 | Fill #2

## 2017-08-15 MED FILL — NOVOLOG FLEXPEN SYRINGE: 100 | 30 days supply | Qty: 15 | Fill #1

## 2017-08-15 MED FILL — GABAPENTIN 300 MG CAPSULE: 300 | 30 days supply | Qty: 180 | Fill #1

## 2017-08-20 MED FILL — HYDROCODON-APAP 10-325: 10-325 | 30 days supply | Qty: 180 | Fill #0

## 2017-08-24 ENCOUNTER — Ambulatory Visit: Payer: BLUE CROSS/BLUE SHIELD | Admitting: Neurology

## 2017-09-06 ENCOUNTER — Ambulatory Visit: Payer: BLUE CROSS/BLUE SHIELD | Admitting: Emergency Medicine

## 2017-09-10 MED FILL — ATORVASTATIN 20 MG TABLET: 20 | 90 days supply | Qty: 90 | Fill #3

## 2017-09-10 MED FILL — PROMETHAZINE 25 MG TABLET: 25 | 30 days supply | Qty: 60 | Fill #3

## 2017-09-10 MED FILL — GABAPENTIN 300 MG CAPS: 300 | 30 days supply | Qty: 180 | Fill #2

## 2017-09-15 ENCOUNTER — Inpatient Hospital Stay (HOSPITAL_COMMUNITY)
Admission: EM | Admit: 2017-09-15 | Discharge: 2017-09-28 | DRG: 291 | Disposition: A | Discharge status: Home Health | Payer: 59 | Attending: Internal Medicine | Admitting: Internal Medicine

## 2017-09-15 ENCOUNTER — Other Ambulatory Visit: Payer: Self-pay

## 2017-09-15 ENCOUNTER — Emergency Department (HOSPITAL_COMMUNITY): Payer: 59

## 2017-09-15 ENCOUNTER — Encounter (HOSPITAL_COMMUNITY): Payer: Self-pay | Admitting: Nurse Practitioner

## 2017-09-15 DIAGNOSIS — Z8249 Family history of ischemic heart disease and other diseases of the circulatory system: Secondary | ICD-10-CM | POA: Diagnosis not present

## 2017-09-15 DIAGNOSIS — I509 Heart failure, unspecified: Secondary | ICD-10-CM

## 2017-09-15 DIAGNOSIS — Z79899 Other long term (current) drug therapy: Secondary | ICD-10-CM | POA: Diagnosis not present

## 2017-09-15 DIAGNOSIS — Z881 Allergy status to other antibiotic agents status: Secondary | ICD-10-CM

## 2017-09-15 DIAGNOSIS — Z885 Allergy status to narcotic agent status: Secondary | ICD-10-CM

## 2017-09-15 DIAGNOSIS — Z888 Allergy status to other drugs, medicaments and biological substances status: Secondary | ICD-10-CM

## 2017-09-15 DIAGNOSIS — E1165 Type 2 diabetes mellitus with hyperglycemia: Secondary | ICD-10-CM | POA: Diagnosis present

## 2017-09-15 DIAGNOSIS — E119 Type 2 diabetes mellitus without complications: Secondary | ICD-10-CM | POA: Diagnosis not present

## 2017-09-15 DIAGNOSIS — Z87891 Personal history of nicotine dependence: Secondary | ICD-10-CM | POA: Diagnosis not present

## 2017-09-15 DIAGNOSIS — I5033 Acute on chronic diastolic (congestive) heart failure: Secondary | ICD-10-CM | POA: Diagnosis present

## 2017-09-15 DIAGNOSIS — D649 Anemia, unspecified: Secondary | ICD-10-CM | POA: Diagnosis present

## 2017-09-15 DIAGNOSIS — Z794 Long term (current) use of insulin: Secondary | ICD-10-CM

## 2017-09-15 DIAGNOSIS — E785 Hyperlipidemia, unspecified: Secondary | ICD-10-CM | POA: Diagnosis present

## 2017-09-15 DIAGNOSIS — I5032 Chronic diastolic (congestive) heart failure: Secondary | ICD-10-CM

## 2017-09-15 DIAGNOSIS — N049 Nephrotic syndrome with unspecified morphologic changes: Secondary | ICD-10-CM | POA: Diagnosis not present

## 2017-09-15 DIAGNOSIS — I5030 Unspecified diastolic (congestive) heart failure: Secondary | ICD-10-CM | POA: Diagnosis not present

## 2017-09-15 DIAGNOSIS — I5189 Other ill-defined heart diseases: Secondary | ICD-10-CM | POA: Diagnosis present

## 2017-09-15 DIAGNOSIS — J9610 Chronic respiratory failure, unspecified whether with hypoxia or hypercapnia: Secondary | ICD-10-CM | POA: Diagnosis not present

## 2017-09-15 DIAGNOSIS — K219 Gastro-esophageal reflux disease without esophagitis: Secondary | ICD-10-CM | POA: Diagnosis present

## 2017-09-15 DIAGNOSIS — Z713 Dietary counseling and surveillance: Secondary | ICD-10-CM

## 2017-09-15 DIAGNOSIS — N179 Acute kidney failure, unspecified: Secondary | ICD-10-CM | POA: Diagnosis present

## 2017-09-15 DIAGNOSIS — J9621 Acute and chronic respiratory failure with hypoxia: Secondary | ICD-10-CM | POA: Diagnosis present

## 2017-09-15 DIAGNOSIS — M7989 Other specified soft tissue disorders: Secondary | ICD-10-CM | POA: Diagnosis not present

## 2017-09-15 DIAGNOSIS — Z833 Family history of diabetes mellitus: Secondary | ICD-10-CM | POA: Diagnosis not present

## 2017-09-15 DIAGNOSIS — I350 Nonrheumatic aortic (valve) stenosis: Secondary | ICD-10-CM | POA: Diagnosis not present

## 2017-09-15 DIAGNOSIS — D860 Sarcoidosis of lung: Secondary | ICD-10-CM | POA: Diagnosis present

## 2017-09-15 DIAGNOSIS — Z9981 Dependence on supplemental oxygen: Secondary | ICD-10-CM | POA: Diagnosis not present

## 2017-09-15 DIAGNOSIS — R601 Generalized edema: Secondary | ICD-10-CM | POA: Diagnosis not present

## 2017-09-15 DIAGNOSIS — I10 Essential (primary) hypertension: Secondary | ICD-10-CM | POA: Diagnosis present

## 2017-09-15 DIAGNOSIS — E662 Morbid (severe) obesity with alveolar hypoventilation: Secondary | ICD-10-CM | POA: Diagnosis present

## 2017-09-15 DIAGNOSIS — E875 Hyperkalemia: Secondary | ICD-10-CM | POA: Diagnosis present

## 2017-09-15 DIAGNOSIS — Z9049 Acquired absence of other specified parts of digestive tract: Secondary | ICD-10-CM

## 2017-09-15 DIAGNOSIS — I110 Hypertensive heart disease with heart failure: Principal | ICD-10-CM | POA: Diagnosis present

## 2017-09-15 DIAGNOSIS — R234 Changes in skin texture: Secondary | ICD-10-CM | POA: Diagnosis present

## 2017-09-15 DIAGNOSIS — R918 Other nonspecific abnormal finding of lung field: Secondary | ICD-10-CM | POA: Diagnosis present

## 2017-09-15 DIAGNOSIS — Z6841 Body Mass Index (BMI) 40.0 and over, adult: Secondary | ICD-10-CM | POA: Diagnosis not present

## 2017-09-15 DIAGNOSIS — N19 Unspecified kidney failure: Secondary | ICD-10-CM

## 2017-09-15 DIAGNOSIS — I779 Disorder of arteries and arterioles, unspecified: Secondary | ICD-10-CM | POA: Diagnosis present

## 2017-09-15 DIAGNOSIS — I739 Peripheral vascular disease, unspecified: Secondary | ICD-10-CM

## 2017-09-15 DIAGNOSIS — R0602 Shortness of breath: Secondary | ICD-10-CM

## 2017-09-15 LAB — COMPREHENSIVE METABOLIC PANEL
ALBUMIN: 2.7 g/dL — AB (ref 3.5–5.0)
ALT: 15 U/L (ref 14–54)
ANION GAP: 3 — AB (ref 5–15)
AST: 25 U/L (ref 15–41)
Alkaline Phosphatase: 118 U/L (ref 38–126)
BUN: 39 mg/dL — AB (ref 6–20)
CHLORIDE: 110 mmol/L (ref 101–111)
CO2: 26 mmol/L (ref 22–32)
Calcium: 8.5 mg/dL — ABNORMAL LOW (ref 8.9–10.3)
Creatinine, Ser: 1.29 mg/dL — ABNORMAL HIGH (ref 0.44–1.00)
GFR calc Af Amer: 53 mL/min — ABNORMAL LOW (ref >60)
GFR calc non Af Amer: 46 mL/min — ABNORMAL LOW (ref >60)
GLUCOSE: 138 mg/dL — AB (ref 65–99)
POTASSIUM: 6.7 mmol/L — AB (ref 3.5–5.1)
Sodium: 139 mmol/L (ref 135–145)
Total Bilirubin: 0.5 mg/dL (ref 0.3–1.2)
Total Protein: 7.1 g/dL (ref 6.5–8.1)

## 2017-09-15 LAB — CBC WITH DIFFERENTIAL/PLATELET
BASOS ABS: 0 10*3/uL (ref 0.0–0.1)
Basophils Relative: 1 %
EOS PCT: 2 %
Eosinophils Absolute: 0.1 10*3/uL (ref 0.0–0.7)
HEMATOCRIT: 27.1 % — AB (ref 36.0–46.0)
Hemoglobin: 8.3 g/dL — ABNORMAL LOW (ref 12.0–15.0)
LYMPHS ABS: 0.9 10*3/uL (ref 0.7–4.0)
LYMPHS PCT: 17 %
MCH: 30.3 pg (ref 26.0–34.0)
MCHC: 30.6 g/dL (ref 30.0–36.0)
MCV: 98.9 fL (ref 78.0–100.0)
MONO ABS: 0.7 10*3/uL (ref 0.1–1.0)
Monocytes Relative: 13 %
NEUTROS ABS: 3.7 10*3/uL (ref 1.7–7.7)
Neutrophils Relative %: 67 %
PLATELETS: 198 10*3/uL (ref 150–400)
RBC: 2.74 MIL/uL — AB (ref 3.87–5.11)
RDW: 14.5 % (ref 11.5–15.5)
WBC: 5.5 10*3/uL (ref 4.0–10.5)

## 2017-09-15 LAB — I-STAT CHEM 8, ED
BUN: 45 mg/dL — ABNORMAL HIGH (ref 6–20)
CREATININE: 1.3 mg/dL — AB (ref 0.44–1.00)
Calcium, Ion: 1.16 mmol/L (ref 1.15–1.40)
Chloride: 109 mmol/L (ref 101–111)
GLUCOSE: 136 mg/dL — AB (ref 65–99)
HCT: 25 % — ABNORMAL LOW (ref 36.0–46.0)
Hemoglobin: 8.5 g/dL — ABNORMAL LOW (ref 12.0–15.0)
POTASSIUM: 6.6 mmol/L — AB (ref 3.5–5.1)
Sodium: 139 mmol/L (ref 135–145)
TCO2: 27 mmol/L (ref 22–32)

## 2017-09-15 LAB — I-STAT TROPONIN, ED: Troponin i, poc: 0.01 ng/mL (ref 0.00–0.08)

## 2017-09-15 LAB — BRAIN NATRIURETIC PEPTIDE: B NATRIURETIC PEPTIDE 5: 299.7 pg/mL — AB (ref 0.0–100.0)

## 2017-09-15 LAB — CBG MONITORING, ED
GLUCOSE-CAPILLARY: 114 mg/dL — AB (ref 65–99)
GLUCOSE-CAPILLARY: 138 mg/dL — AB (ref 65–99)

## 2017-09-15 LAB — GLUCOSE, CAPILLARY: Glucose-Capillary: 150 mg/dL — ABNORMAL HIGH (ref 65–99)

## 2017-09-15 MED ORDER — INSULIN GLARGINE 100 UNIT/ML ~~LOC~~ SOLN
20.0000 [IU] | Freq: Two times a day (BID) | SUBCUTANEOUS | Status: DC
  Administered 2017-09-15 – 2017-09-28 (×25): 20 [IU] via SUBCUTANEOUS
  Filled 2017-09-15 (×27): qty 0.2

## 2017-09-15 MED ORDER — SODIUM CHLORIDE 0.9 % IV SOLN: 250.0000 mL | INTRAVENOUS | Status: DC | PRN

## 2017-09-15 MED ORDER — ALBUTEROL SULFATE (2.5 MG/3ML) 0.083% IN NEBU
10.0000 mg | INHALATION_SOLUTION | Freq: Once | RESPIRATORY_TRACT | Status: AC
  Administered 2017-09-15: 10 mg via RESPIRATORY_TRACT
  Filled 2017-09-15: qty 12

## 2017-09-15 MED ORDER — ATORVASTATIN CALCIUM 20 MG PO TABS
20.0000 mg | ORAL_TABLET | Freq: Every day | ORAL | Status: DC
  Administered 2017-09-16 – 2017-09-28 (×13): 20 mg via ORAL
  Filled 2017-09-15 (×13): qty 1

## 2017-09-15 MED ORDER — ALPRAZOLAM 0.25 MG PO TABS
0.2500 mg | ORAL_TABLET | Freq: Two times a day (BID) | ORAL | Status: DC | PRN
  Administered 2017-09-15 – 2017-09-20 (×4): 0.25 mg via ORAL
  Filled 2017-09-15 (×4): qty 1

## 2017-09-15 MED ORDER — FOLIC ACID 1 MG PO TABS
1.0000 mg | ORAL_TABLET | Freq: Every day | ORAL | Status: DC
  Administered 2017-09-16 – 2017-09-28 (×13): 1 mg via ORAL
  Filled 2017-09-15 (×13): qty 1

## 2017-09-15 MED ORDER — PROMETHAZINE HCL 25 MG PO TABS: 25.0000 mg | ORAL_TABLET | ORAL | Status: DC | PRN

## 2017-09-15 MED ORDER — HYDROCODONE-ACETAMINOPHEN 7.5-325 MG PO TABS
1.0000 | ORAL_TABLET | Freq: Four times a day (QID) | ORAL | Status: DC | PRN
  Administered 2017-09-16 – 2017-09-27 (×20): 1 via ORAL
  Filled 2017-09-15 (×20): qty 1

## 2017-09-15 MED ORDER — FUROSEMIDE 10 MG/ML IJ SOLN
80.0000 mg | Freq: Two times a day (BID) | INTRAMUSCULAR | Status: DC
  Administered 2017-09-16: 80 mg via INTRAVENOUS
  Filled 2017-09-15: qty 8

## 2017-09-15 MED ORDER — PHENYTOIN SODIUM EXTENDED 100 MG PO CAPS
200.0000 mg | ORAL_CAPSULE | Freq: Two times a day (BID) | ORAL | Status: DC
  Administered 2017-09-15 – 2017-09-28 (×26): 200 mg via ORAL
  Filled 2017-09-15 (×26): qty 2

## 2017-09-15 MED ORDER — FUROSEMIDE 10 MG/ML IJ SOLN
80.0000 mg | Freq: Once | INTRAMUSCULAR | Status: AC
  Administered 2017-09-15: 80 mg via INTRAVENOUS
  Filled 2017-09-15: qty 8

## 2017-09-15 MED ORDER — SODIUM CHLORIDE 0.9% FLUSH
3.0000 mL | Freq: Two times a day (BID) | INTRAVENOUS | Status: DC
  Administered 2017-09-17 – 2017-09-28 (×19): 3 mL via INTRAVENOUS

## 2017-09-15 MED ORDER — GABAPENTIN 300 MG PO CAPS
300.0000 mg | ORAL_CAPSULE | Freq: Three times a day (TID) | ORAL | Status: DC
  Administered 2017-09-15 – 2017-09-28 (×38): 300 mg via ORAL
  Filled 2017-09-15 (×38): qty 1

## 2017-09-15 MED ORDER — IOPAMIDOL (ISOVUE-300) INJECTION 61%
80.0000 mL | Freq: Once | INTRAVENOUS | Status: AC | PRN
  Administered 2017-09-15: 80 mL via INTRAVENOUS

## 2017-09-15 MED ORDER — DEXTROSE 10 % IV SOLN
Freq: Once | INTRAVENOUS | Status: AC
  Administered 2017-09-15: 22:00:00 via INTRAVENOUS
  Filled 2017-09-15: qty 1000

## 2017-09-15 MED ORDER — LINAGLIPTIN 5 MG PO TABS
5.0000 mg | ORAL_TABLET | Freq: Every day | ORAL | Status: DC
  Administered 2017-09-16: 5 mg via ORAL
  Filled 2017-09-15: qty 1

## 2017-09-15 MED ORDER — TIZANIDINE HCL 4 MG PO TABS
4.0000 mg | ORAL_TABLET | Freq: Three times a day (TID) | ORAL | Status: DC | PRN
  Administered 2017-09-16 – 2017-09-27 (×14): 4 mg via ORAL
  Filled 2017-09-15 (×17): qty 1

## 2017-09-15 MED ORDER — HEPARIN SODIUM (PORCINE) 5000 UNIT/ML IJ SOLN
5000.0000 [IU] | Freq: Three times a day (TID) | INTRAMUSCULAR | Status: DC
  Administered 2017-09-15 – 2017-09-28 (×38): 5000 [IU] via SUBCUTANEOUS
  Filled 2017-09-15 (×38): qty 1

## 2017-09-15 MED ORDER — INSULIN ASPART 100 UNIT/ML IV SOLN
10.0000 [IU] | Freq: Once | INTRAVENOUS | Status: AC
  Administered 2017-09-15: 10 [IU] via INTRAVENOUS
  Filled 2017-09-15: qty 0.1

## 2017-09-15 MED ORDER — IOPAMIDOL (ISOVUE-300) INJECTION 61%
INTRAVENOUS | Status: AC
  Filled 2017-09-15: qty 100

## 2017-09-15 MED ORDER — ALBUTEROL SULFATE (2.5 MG/3ML) 0.083% IN NEBU
5.0000 mg | INHALATION_SOLUTION | Freq: Once | RESPIRATORY_TRACT | Status: AC
  Administered 2017-09-15: 5 mg via RESPIRATORY_TRACT
  Filled 2017-09-15: qty 6

## 2017-09-15 MED ORDER — ONDANSETRON HCL 4 MG/2ML IJ SOLN: 4.0000 mg | Freq: Four times a day (QID) | INTRAMUSCULAR | Status: DC | PRN

## 2017-09-15 MED ORDER — SODIUM CHLORIDE 0.9% FLUSH
3.0000 mL | INTRAVENOUS | Status: DC | PRN
  Administered 2017-09-18: 3 mL via INTRAVENOUS
  Filled 2017-09-15: qty 3

## 2017-09-15 MED ORDER — ACETAMINOPHEN 325 MG PO TABS: 650.0000 mg | ORAL_TABLET | ORAL | Status: DC | PRN

## 2017-09-15 MED ORDER — DIPHENHYDRAMINE HCL 25 MG PO CAPS: 25.0000 mg | ORAL_CAPSULE | Freq: Every evening | ORAL | Status: DC | PRN

## 2017-09-15 MED ORDER — ASPIRIN EC 81 MG PO TBEC
81.0000 mg | DELAYED_RELEASE_TABLET | Freq: Every day | ORAL | Status: DC
  Administered 2017-09-16 – 2017-09-20 (×5): 81 mg via ORAL
  Filled 2017-09-15 (×5): qty 1

## 2017-09-15 MED ORDER — LISINOPRIL 10 MG PO TABS
10.0000 mg | ORAL_TABLET | Freq: Every day | ORAL | Status: DC
  Administered 2017-09-16 – 2017-09-20 (×5): 10 mg via ORAL
  Filled 2017-09-15 (×5): qty 1

## 2017-09-15 MED ORDER — IPRATROPIUM BROMIDE 0.02 % IN SOLN
0.5000 mg | Freq: Once | RESPIRATORY_TRACT | Status: AC
  Administered 2017-09-15: 0.5 mg via RESPIRATORY_TRACT
  Filled 2017-09-15: qty 2.5

## 2017-09-15 MED ORDER — ZOLPIDEM TARTRATE 5 MG PO TABS
5.0000 mg | ORAL_TABLET | Freq: Every evening | ORAL | Status: DC | PRN
  Filled 2017-09-15: qty 1

## 2017-09-15 NOTE — ED Notes (Signed)
Jonelle Sidle, MD notified about Insulin given IM. Dr. Jonelle Sidle stated to start dextrose as ordered and to check CBG before pt goes upstairs to make sure her sugar does not drop.

## 2017-09-15 NOTE — ED Triage Notes (Signed)
Per EMS, patient comes from home. C/o SOB and fluid build up. Hx sarcoidosis, diabetes, seizures and dependent edema. Patient takes 80 lasix and has bumped up her doses on her own. On home oxygen 2-4L. Alert and oriented. She also has past "cognitive issues".

## 2017-09-15 NOTE — ED Notes (Signed)
Bed: VL31 Expected date: 09/15/17 Expected time: 3:57 PM Means of arrival: Ambulance Comments: Hold 23 edema and shob

## 2017-09-15 NOTE — ED Provider Notes (Signed)
[facility] DEPT Provider Note   CSN: [id number] Arrival date & time: 09/15/17  1608     History   Chief Complaint Chief Complaint  Patient presents with  . Shortness of Breath    HPI Marissa Wilkerson is a 57 y.o. female.  HPI   Patient is a 57 year old female with a history of sarcoidosis of the lungs, diabetes mellitus (on Toujeo and subQ insulin), hypertension presenting for shortness of breath progressively worsening for 1 week.  Patient reports that it is most acute with exertion, however patient reports her exertion is minimal because shortness of breath occurs with just sitting upright, getting out of the bed, or any minimal activity.  Patient denies any chest pain with the shortness of breath.  Patient reports she has not been able to lie flat in several months, and this is chronic for patient.  Patient does have chronic lower extremity edema for which she takes 80 mg of Lasix daily, however she has not noticed any significant increase in lower extremity edema.  Patient does report that a couple times over the last week, she has increased her Lasix dose to 2-80 mg doses, equaling 160 mg.  Patient has noted that her abdomen feels increasingly "swollen".  Patient reports that her abdomen feels tense to her.  Patient denies any history of liver disease.  Patient denies fevers, chills, congestion, rhinorrhea, or sore throat.  Patient denies any history of DVT/PE, recent immobilization, recent hospitalization, recent surgery, estrogen use.  Patient is followed by internal medicine for her sarcoidosis.  Up until 6 months ago, she was on methotrexate, but was unable to continue to keep coming to the office monthly for laboratory analysis, so she terminated this.  Patient is not currently on any immunosuppressive therapy for sarcoidosis.  Patient followed by cardiology 2 years ago for echocardiography, however she does not see cardiology currently.  No history of  exercise stress test.  No history of cardiac catheterization.  Patient does have a history of early MI in her father in his 58s.  Past Medical History:  Diagnosis Date  . Anemia    05/04/15 hemglobin 8.8  . Arthritis   . Diabetes mellitus without complication (HCC)    insulin dependent, po meds  . Diastolic dysfunction    followed by dr polite found on echo 06-12-13 , grade 1 per pt  . Dyslipidemia   . Edema of extremities    PITTING EDEMA     . GERD (gastroesophageal reflux disease)   . Hypertension   . Sarcoidosis of lung [facility])    pulmonologist Dr.Byrum, [id number]   . Shortness of breath    ON EXERTION , O2 2-3 l/min  . 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 Vertigo, benign positional   . Wheezing    OCC    Patient Active Problem List   Diagnosis Date Noted  . Hyperlipidemia 08/08/2016  . Bilateral carotid artery disease ([place]) 08/08/2016  . Lower extremity weakness 02/22/2016  . Multiple lung nodules 03/05/2014  . Obstructive lung disease ([place]) 02/09/2014  . Chronic respiratory failure ([place]) 09/02/2013  . Sarcoidosis of lung ([place]) 09/02/2013  . Hypertension   . Diabetes mellitus without complication ([place])   . Diastolic dysfunction     Past Surgical History:  Procedure Laterality Date  . all teeth extraction  7-8 yrs ago  . CHOLECYSTECTOMY  1996  . COLONOSCOPY WITH PROPOFOL N/A 08/05/2013   Procedure: COLONOSCOPY  WITH PROPOFOL;  Surgeon: Garlan Fair, MD;  Location: [facility];  Service: Endoscopy;  Laterality: N/A;  . ESOPHAGOGASTRODUODENOSCOPY (EGD) WITH PROPOFOL N/A 05/31/2015   Procedure: ESOPHAGOGASTRODUODENOSCOPY (EGD) WITH PROPOFOL;  Surgeon: Garlan Fair, MD;  Location: [facility];  Service: Endoscopy;  Laterality: N/A;  . LUNG BIOPSY Left 03/05/2014   Procedure: LUNG BIOPSY;  Surgeon: Gaye Pollack, MD;  Location: [facility];  Service: Thoracic;  Laterality: Left;  [name] VIDEO ASSISTED THORACOSCOPY Left 03/05/2014   Procedure:  VIDEO ASSISTED THORACOSCOPY;  Surgeon: Gaye Pollack, MD;  Location: [facility];  Service: Thoracic;  Laterality: Left;  [name] VIDEO BRONCHOSCOPY Bilateral 09/04/2013   Procedure: VIDEO BRONCHOSCOPY WITH FLUORO;  Surgeon: Collene Gobble, MD;  Location: [facility];  Service: Cardiopulmonary;  Laterality: Bilateral;  . WISDOM TOOTH EXTRACTION  yrs ago    OB History    No data available       Home Medications    Prior to Admission medications   Medication Sig Start Date End Date Taking? Authorizing Provider  albuterol (PROVENTIL HFA;VENTOLIN HFA) 108 (90 BASE) MCG/ACT inhaler Inhale 2 puffs into the lungs every 6 (six) hours as needed for wheezing or shortness of breath. 10/30/13   Collene Gobble, MD  atorvastatin (LIPITOR) 20 MG tablet Take 20 mg by mouth daily.    [provider]  diphenhydrAMINE (BENADRYL) 25 mg capsule Take 25-50 mg by mouth at bedtime as needed for sleep.     [provider]  folic acid (FOLVITE) 1 MG tablet Take 1 tablet (1 mg total) by mouth daily. 12/08/15   Collene Gobble, MD  furosemide (LASIX) 40 MG tablet Take 80 mg by mouth daily at 3 pm.  09/05/13   Seward Carol, MD  gabapentin (NEURONTIN) 300 MG capsule Take 300mg  in AM and 600mg  in PM    [provider]  HYDROcodone-acetaminophen (NORCO) 7.5-325 MG tablet Take 1 tablet by mouth every 6 (six) hours as needed for moderate pain.    [provider]  insulin aspart (NOVOLOG) 100 UNIT/ML injection Inject 20-40 Units into the skin 2 (two) times daily before a meal. Sliding scale    [provider]  insulin glargine (LANTUS) 100 UNIT/ML injection Inject 20 Units into the skin 2 (two) times daily.     [provider]  lisinopril (PRINIVIL,ZESTRIL) 10 MG tablet Take 10 mg by mouth daily.    [provider]  methotrexate (RHEUMATREX) 2.5 MG tablet TAKE 6 TABLETS BY MOUTH ONCE A WEEK ON MONDAYS. 05/21/17   Collene Gobble, MD  phenytoin (DILANTIN) 200 MG ER capsule Take  200 mg by mouth 2 (two) times daily. 07/28/16 08/27/16  [provider]  potassium chloride (K-DUR,KLOR-CON) 10 MEQ tablet Take 1 tablet (10 mEq total) by mouth daily. Patient taking differently: Take 10 mEq by mouth daily at 3 pm.  09/05/13   Seward Carol, MD  promethazine (PHENERGAN) 25 MG tablet Take 25 mg by mouth every 4 (four) hours as needed for nausea or vomiting.    [provider]  sitaGLIPtin (JANUVIA) 100 MG tablet Take 100 mg by mouth daily.     [provider]  tiZANidine (ZANAFLEX) 4 MG tablet Take 4 mg by mouth every 8 (eight) hours as needed for muscle spasms.  10/01/14   [provider]    Family History Family History  Problem Relation Age of Onset  . Heart failure Mother   . Diabetes Father   . Hypertension Father   .  Diabetes Brother   . Hypertension Brother     Social History Social History   Tobacco Use  . Smoking status: Former Smoker    Packs/day: 0.50    Years: 25.00    Pack years: 12.50    Types: Cigarettes    Last attempt to quit: 08/21/2012    Years since quitting: 5.0  . Smokeless tobacco: Never Used  Substance Use Topics  . Alcohol use: Yes    Comment: very rare  . Drug use: No     Allergies   Fentanyl; Metformin and related; Mobic [meloxicam]; Sulfa antibiotics; and Voltaren [diclofenac]   Review of Systems Review of Systems  Constitutional: Negative for chills and fever.  HENT: Negative for congestion, rhinorrhea, sinus pain and sore throat.   Eyes: Negative for visual disturbance.  Respiratory: Positive for shortness of breath. Negative for cough and chest tightness.   Cardiovascular: Positive for leg swelling. Negative for chest pain.  Gastrointestinal: Positive for abdominal distention. Negative for constipation, diarrhea, nausea and vomiting.  Genitourinary: Negative for dysuria.  Musculoskeletal: Negative for back pain and myalgias.  Skin: Negative for rash.  Neurological: Negative for  headaches.     Physical Exam Updated Vital Signs BP (!) 160/66 (BP Location: Left Arm)   Pulse 65   Temp 98.6 F (37 C) (Oral)   Resp 20   Ht 5\' 3"  (1.6 m)   Wt 136.1 kg (300 lb)   LMP 08/28/2008   SpO2 98%   BMI 53.14 kg/m   Physical Exam  Constitutional: She appears well-developed and well-nourished. No distress.  Obese female sitting upright and conversing comfortably.  HENT:  Head: Normocephalic and atraumatic.  Mouth/Throat: Oropharynx is clear and moist.  Eyes: Conjunctivae and EOM are normal. Pupils are equal, round, and reactive to light.  Neck: Normal range of motion. Neck supple.  Cardiovascular: Normal rate, regular rhythm, S1 normal and S2 normal.  Murmur heard. Soft Grade I systolic murmur. There is 2+ and equal pitting lower extremity edema.  Pulmonary/Chest: Effort normal. No stridor. No respiratory distress. She has no wheezes.  Lung sounds are diminished in bilateral lung bases.  Abdominal: Soft. She exhibits distension. There is no guarding.  Abdomen is distended and dull to percussion.  Abdomen diffusely uncomfortable to palpation, but no focal tenderness of the abdomen.  Musculoskeletal: Normal range of motion. She exhibits no edema or deformity.  Lymphadenopathy:    She has no cervical adenopathy.  Neurological: She is alert.  Cranial nerves grossly intact. Patient moves extremities symmetrically and with good coordination.  Skin: Skin is warm and dry. No rash noted. No erythema.  Psychiatric: She has a normal mood and affect. Her behavior is normal. Judgment and thought content normal.  Nursing note and vitals reviewed.    ED Treatments / Results  Labs (all labs ordered are listed, but only abnormal results are displayed) Labs Reviewed - No data to display  EKG  EKG Interpretation None       Radiology No results found.  Procedures Procedures (including critical care time)  Medications Ordered in ED Medications  albuterol  (PROVENTIL) (2.5 MG/3ML) 0.083% nebulizer solution 5 mg (not administered)  ipratropium (ATROVENT) nebulizer solution 0.5 mg (not administered)     Initial Impression / Assessment and Plan / ED Course  I have reviewed the triage vital signs and the nursing notes.  Pertinent labs & imaging results that were available during my care of the patient were reviewed by me and considered in my medical  decision making (see chart for details).      Final Clinical Impressions(s) / ED Diagnoses   Final diagnoses:  Hyperkalemia  SOB (shortness of breath)  Acute kidney injury ([place])   Patient is nontoxic-appearing, afebrile, and conversing comfortably at rest.  Patient is on 2.5 L of nasal cannula per her baseline.  Differential diagnosis includes advancement of sarcoidosis due to lack of immunosuppressive therapy, new onset congestive heart failure, ACS, pneumonia.  Lower suspicion for PE at this time due to other diagnosis more likely at this.  Given chronic lung disease, patient is a high risk patient, however as well as negative, and only PERC  point is for age greater than 65.  Will order chest x-ray, EKG, troponin, CMP, and CBC.  Patient breathing comfortable on 2.5 L and 100% saturation.  Will do one trial of DuoNeb.  Potassium noted at 6.7.  EKG without acute changes from previous.  Will initiate Lasix, albuterol, insulin with dextrose.  Patient also noted to have significant cardiomegaly and fluid overload and pulmonary x-ray.  Chronic scarring from sarcoidosis.  Will admit to hospital medicine for diuresis and potassium management.  Patient did note to me that she takes 10 mEq of potassium daily.  Patient does have an elevated creatinine above her baseline which is approximately 0.8.  It is 1.29 today.  Troponin is negative.  CT of the abdomen and pelvis ordered due to abdominal distention.  Results of CT abdomen and pelvis pending at the time of patient's admission to the hospital and speaking  with Dr. Jonelle Sidle.   Dr. Jonelle Sidle of hospital medicine consulted and patient to be admitted for hyperkalemia, shortness of breath, and acute kidney injury.  This is a shared visit with Dr. Malvin Johns. Patient was independently evaluated by this attending physician. Attending physician consulted in evaluation and admission management.  ED Discharge Orders    None       Tamala Julian 09/16/17 0140    Malvin Johns, MD 09/16/17 1500

## 2017-09-15 NOTE — ED Notes (Signed)
Respiratory called regarding nebs

## 2017-09-15 NOTE — H&P (Addendum)
History and Physical    Marissa Wilkerson ESP:[id number] DOB: Jan 13, 1961 DOA: 09/15/2017  Referring MD/NP/PA: Langston Masker, PA  PCP: Jani Gravel, MD   Outpatient Specialists: Dr Gwenlyn Found, Cardiology None   Patient coming from: Home  Chief Complaint: Shortness of breath  HPI: Marissa Wilkerson is a 57 y.o. female with medical history significant of sarcoidosis and hypertension with no prior diagnosis of congestive heart failure that came from home with significant shortness of breath generalized swelling increasing lower extremity edema especially. Patient had an echocardiogram 2 years ago as a follow-up for her sarcoidosis. She has been on Lasix but not diagnosed with any CHF. She takes insulin for her diabetes as well as morbidly obese. She's been having progressive exertional dyspnea. Associated with cough. She has been unable to lie flat and had PND and orthopnea. Patient was notably fluid overloaded. She has been on medication for her sarcoidosis including methotrexate. She is having close follow-up with her pulmonologist. No known history of cardiac disease. Patient also was hyperkalemic  ED Course: Patient was seen and evaluated. IV Lasix was given. CT chest abdomen as well as chest x-ray ordered. Potassium was 6.7 on admission  Review of Systems: As per HPI otherwise 10 point review of systems negative.    Past Medical History:  Diagnosis Date  . Anemia    05/04/15 hemglobin 8.8  . Arthritis   . Diabetes mellitus without complication (HCC)    insulin dependent, po meds  . Diastolic dysfunction    followed by dr polite found on echo 06-12-13 , grade 1 per pt  . Dyslipidemia   . Edema of extremities    PITTING EDEMA     . GERD (gastroesophageal reflux disease)   . Hypertension   . Sarcoidosis of lung [facility])    pulmonologist Dr.Byrum, [id number]   . Shortness of breath    ON EXERTION , O2 2-3 l/min  . 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 Vertigo, benign  positional   . Wheezing    OCC    Past Surgical History:  Procedure Laterality Date  . all teeth extraction  7-8 yrs ago  . CHOLECYSTECTOMY  1996  . COLONOSCOPY WITH PROPOFOL N/A 08/05/2013   Procedure: COLONOSCOPY WITH PROPOFOL;  Surgeon: Garlan Fair, MD;  Location: [facility];  Service: Endoscopy;  Laterality: N/A;  . ESOPHAGOGASTRODUODENOSCOPY (EGD) WITH PROPOFOL N/A 05/31/2015   Procedure: ESOPHAGOGASTRODUODENOSCOPY (EGD) WITH PROPOFOL;  Surgeon: Garlan Fair, MD;  Location: [facility];  Service: Endoscopy;  Laterality: N/A;  . LUNG BIOPSY Left 03/05/2014   Procedure: LUNG BIOPSY;  Surgeon: Gaye Pollack, MD;  Location: [place];  Service: Thoracic;  Laterality: Left;  [name] VIDEO ASSISTED THORACOSCOPY Left 03/05/2014   Procedure: VIDEO ASSISTED THORACOSCOPY;  Surgeon: Gaye Pollack, MD;  Location: [place];  Service: Thoracic;  Laterality: Left;  [name] VIDEO BRONCHOSCOPY Bilateral 09/04/2013   Procedure: VIDEO BRONCHOSCOPY WITH FLUORO;  Surgeon: Collene Gobble, MD;  Location: [facility];  Service: Cardiopulmonary;  Laterality: Bilateral;  . WISDOM TOOTH EXTRACTION  yrs ago     reports that she quit smoking about 5 years ago. Her smoking use included cigarettes. She has a 12.50 pack-year smoking history. she has never used smokeless tobacco. She reports that she drinks alcohol. She reports that she does not use drugs.  Allergies  Allergen Reactions  . Fentanyl Other (See Comments)    Patches. Blistered skin.   . Metformin And  Related Nausea And Vomiting  . Mobic [Meloxicam] Nausea And Vomiting  . Sulfa Antibiotics Rash  . Voltaren [Diclofenac] Nausea And Vomiting    Family History  Problem Relation Age of Onset  . Heart failure Mother   . Diabetes Father   . Hypertension Father   . Diabetes Brother   . Hypertension Brother      Prior to Admission medications   Medication Sig Start Date End Date Taking? Authorizing Provider  albuterol (PROVENTIL HFA;VENTOLIN HFA) 108 (90  BASE) MCG/ACT inhaler Inhale 2 puffs into the lungs every 6 (six) hours as needed for wheezing or shortness of breath. 10/30/13   Collene Gobble, MD  atorvastatin (LIPITOR) 20 MG tablet Take 20 mg by mouth daily.    [provider]  diphenhydrAMINE (BENADRYL) 25 mg capsule Take 25-50 mg by mouth at bedtime as needed for sleep.     [provider]  folic acid (FOLVITE) 1 MG tablet Take 1 tablet (1 mg total) by mouth daily. 12/08/15   Collene Gobble, MD  furosemide (LASIX) 40 MG tablet Take 80 mg by mouth daily at 3 pm.  09/05/13   Seward Carol, MD  gabapentin (NEURONTIN) 300 MG capsule Take 300mg  in AM and 600mg  in PM    [provider]  HYDROcodone-acetaminophen (NORCO) 7.5-325 MG tablet Take 1 tablet by mouth every 6 (six) hours as needed for moderate pain.    [provider]  insulin aspart (NOVOLOG) 100 UNIT/ML injection Inject 20-40 Units into the skin 2 (two) times daily before a meal. Sliding scale    [provider]  insulin glargine (LANTUS) 100 UNIT/ML injection Inject 20 Units into the skin 2 (two) times daily.     [provider]  lisinopril (PRINIVIL,ZESTRIL) 10 MG tablet Take 10 mg by mouth daily.    [provider]  methotrexate (RHEUMATREX) 2.5 MG tablet TAKE 6 TABLETS BY MOUTH ONCE A WEEK ON MONDAYS. 05/21/17   Collene Gobble, MD  phenytoin (DILANTIN) 200 MG ER capsule Take 200 mg by mouth 2 (two) times daily. 07/28/16 08/27/16  [provider]  potassium chloride (K-DUR,KLOR-CON) 10 MEQ tablet Take 1 tablet (10 mEq total) by mouth daily. Patient taking differently: Take 10 mEq by mouth daily at 3 pm.  09/05/13   Seward Carol, MD  promethazine (PHENERGAN) 25 MG tablet Take 25 mg by mouth every 4 (four) hours as needed for nausea or vomiting.    [provider]  sitaGLIPtin (JANUVIA) 100 MG tablet Take 100 mg by mouth daily.     [provider]  tiZANidine (ZANAFLEX) 4 MG tablet Take 4 mg by mouth  every 8 (eight) hours as needed for muscle spasms.  10/01/14   [provider]    Physical Exam: Vitals:   09/15/17 1631 09/15/17 1635 09/15/17 1800 09/15/17 1959  BP:  (!) 160/66 (!) 157/65   Pulse:  65 63   Resp:  20 20   Temp:  98.6 F (37 C)    TempSrc:  Oral    SpO2: 98% 98% 100% 100%  Weight:  136.1 kg (300 lb)    Height:  5\' 3"  (1.6 m)        Constitutional: NAD, calm, comfortable Vitals:   09/15/17 1631 09/15/17 1635 09/15/17 1800 09/15/17 1959  BP:  (!) 160/66 (!) 157/65   Pulse:  65 63   Resp:  20 20   Temp:  98.6 F (37 C)    TempSrc:  Oral  SpO2: 98% 98% 100% 100%  Weight:  136.1 kg (300 lb)    Height:  5\' 3"  (1.6 m)     Eyes: PERRL, lids and conjunctivae normal ENMT: Mucous membranes are moist. Posterior pharynx clear of any exudate or lesions.Normal dentition.  Neck: normal, supple, no masses, no thyromegaly Respiratory: clear to auscultation bilaterally, no wheezing, no crackles. Normal respiratory effort. No accessory muscle use.  Cardiovascular: Regular rate and rhythm, no murmurs / rubs / gallops. No extremity edema. 2+ pedal pulses. No carotid bruits.  Abdomen: no tenderness, no masses palpated. No hepatosplenomegaly. Bowel sounds positive.  Musculoskeletal: no clubbing / cyanosis. No joint deformity upper and lower extremities. Good ROM, no contractures. Normal muscle tone.  Skin: no rashes, lesions, ulcers. No induration Neurologic: CN 2-12 grossly intact. Sensation intact, DTR normal. Strength 5/5 in all 4.  Psychiatric: Normal judgment and insight. Alert and oriented x 3. Normal mood.   Labs on Admission: I have personally reviewed following labs and imaging studies  CBC: Recent Labs  Lab 09/15/17 1844 09/15/17 1905  WBC 5.5  --   NEUTROABS 3.7  --   HGB 8.3* 8.5*  HCT 27.1* 25.0*  MCV 98.9  --   PLT 198  --    Basic Metabolic Panel: Recent Labs  Lab 09/15/17 1844 09/15/17 1905  NA 139 139  K 6.7* 6.6*  CL 110 109  CO2  26  --   GLUCOSE 138* 136*  BUN 39* 45*  CREATININE 1.29* 1.30*  CALCIUM 8.5*  --    GFR: Estimated Creatinine Clearance: 66.3 mL/min (A) (by C-G formula based on SCr of 1.3 mg/dL (H)). Liver Function Tests: Recent Labs  Lab 09/15/17 1844  AST 25  ALT 15  ALKPHOS 118  BILITOT 0.5  PROT 7.1  ALBUMIN 2.7*   No results for input(s): LIPASE, AMYLASE in the last 168 hours. No results for input(s): AMMONIA in the last 168 hours. Coagulation Profile: No results for input(s): INR, PROTIME in the last 168 hours. Cardiac Enzymes: No results for input(s): CKTOTAL, CKMB, CKMBINDEX, TROPONINI in the last 168 hours. BNP (last 3 results) No results for input(s): PROBNP in the last 8760 hours. HbA1C: No results for input(s): HGBA1C in the last 72 hours. CBG: Recent Labs  Lab 09/15/17 1946  GLUCAP 114*   Lipid Profile: No results for input(s): CHOL, HDL, LDLCALC, TRIG, CHOLHDL, LDLDIRECT in the last 72 hours. Thyroid Function Tests: No results for input(s): TSH, T4TOTAL, FREET4, T3FREE, THYROIDAB in the last 72 hours. Anemia Panel: No results for input(s): VITAMINB12, FOLATE, FERRITIN, TIBC, IRON, RETICCTPCT in the last 72 hours. Urine analysis:    Component Value Date/Time   COLORURINE YELLOW 03/03/2014 0942   APPEARANCEUR CLOUDY (A) 03/03/2014 0942   LABSPEC 1.024 03/03/2014 0942   PHURINE 6.0 03/03/2014 0942   GLUCOSEU 500 (A) 03/03/2014 0942   HGBUR MODERATE (A) 03/03/2014 0942   BILIRUBINUR NEGATIVE 03/03/2014 0942   KETONESUR NEGATIVE 03/03/2014 0942   PROTEINUR 100 (A) 03/03/2014 0942   UROBILINOGEN 1.0 03/03/2014 0942   NITRITE POSITIVE (A) 03/03/2014 0942   LEUKOCYTESUR SMALL (A) 03/03/2014 0942   Sepsis Labs: @LABRCNTIP (procalcitonin:4,lacticidven:4) )No results found for this or any previous visit (from the past 240 hour(s)).   Radiological Exams on Admission: Dg Chest 2 View  Result Date: 09/15/2017 CLINICAL DATA:  Shortness of breath EXAM: CHEST  2 VIEW  COMPARISON:  Chest CT 06/13/2017 FINDINGS: Cardiomegaly and vascular pedicle widening that is chronic. Congested appearance of vessels with superimposed mild  streaky density. Trace pleural effusions. No pneumothorax. IMPRESSION: 1. Cardiomegaly with vascular congestion and trace effusions. 2. History of sarcoidosis with mild scarring. Electronically Signed   By: Monte Fantasia M.D.   On: 09/15/2017 18:41    EKG: Independently reviewed.   Assessment/Plan Principal Problem:   CHF (congestive heart failure), NYHA class I, unspecified failure chronicity, diastolic (HCC) Active Problems:   Chronic respiratory failure (HCC)   Hypertension   Diabetes mellitus without complication (HCC)   Diastolic dysfunction   Sarcoidosis of lung (HCC)   Hyperlipidemia   Bilateral carotid artery disease (HCC)   Hyperkalemia   CHF (congestive heart failure) (HCC)    #1 congestive heart failure: More than likely this would be diastolic dysfunction. Patient has obvious fluid overload. We will admit her and start IV Lasix 80 mg twice a day. Get echocardiogram and then consult cardiology. Mobilize patient closely. She is already on listening program. Beta blockers as well as other cardiac medications can be gradually started. Causes could be long-standing hypertension or sarcoid Cardiomyopathy  #2 acute on chronic respiratory failure second to CHF. Patient most likely has pickwickian as well.  #3 diabetes: Patient is onTuojeo at home. Continue his insulin with sliding scale insulin here.  #4 morbid obesity: Dietary counseling once patient improves  #5 sarcoidosis: Methotrexate*weekly on Mondays. Patient will be initiated while in the hospital  #6 hyperkalemia: Patient has elevated potassium. Receive IV insulin with dextrose as well as repeat potassium in the ER showing improvement.   DVT prophylaxis: Lovenox   Code Status: Full code   Family Communication: Husband   Disposition Plan: home   Consults  called: None   Admission status: In patient  Severity of Illness: The appropriate patient status for this patient is INPATIENT. Inpatient status is judged to be reasonable and necessary in order to provide the required intensity of service to ensure the patient's safety. The patient's presenting symptoms, physical exam findings, and initial radiographic and laboratory data in the context of their chronic comorbidities is felt to place them at high risk for further clinical deterioration. Furthermore, it is not anticipated that the patient will be medically stable for discharge from the hospital within 2 midnights of admission. The following factors support the patient status of inpatient.   " The patient's presenting symptoms include shortness of breath. " The worrisome physical exam findings include generalized anasarca. " The initial radiographic and laboratory data are worrisome because of evidence of fluid overload. " The chronic co-morbidities include sarcoidosis.   * I certify that at the point of admission it is my clinical judgment that the patient will require inpatient hospital care spanning beyond 2 midnights from the point of admission due to high intensity of service, high risk for further deterioration and high frequency of surveillance required.Barbette Merino MD [facility] Pager [phone]  If 7PM-7AM, please contact night-coverage [email or web address] Password [facility]  09/15/2017, 8:44 PM

## 2017-09-16 ENCOUNTER — Inpatient Hospital Stay (HOSPITAL_COMMUNITY): Payer: 59

## 2017-09-16 DIAGNOSIS — I350 Nonrheumatic aortic (valve) stenosis: Secondary | ICD-10-CM

## 2017-09-16 LAB — CBC WITH DIFFERENTIAL/PLATELET
BASOS ABS: 0 10*3/uL (ref 0.0–0.1)
Basophils Relative: 1 %
Eosinophils Absolute: 0.1 10*3/uL (ref 0.0–0.7)
Eosinophils Relative: 2 %
HEMATOCRIT: 25.3 % — AB (ref 36.0–46.0)
HEMOGLOBIN: 7.6 g/dL — AB (ref 12.0–15.0)
LYMPHS ABS: 1.1 10*3/uL (ref 0.7–4.0)
LYMPHS PCT: 19 %
MCH: 29.9 pg (ref 26.0–34.0)
MCHC: 30 g/dL (ref 30.0–36.0)
MCV: 99.6 fL (ref 78.0–100.0)
Monocytes Absolute: 0.8 10*3/uL (ref 0.1–1.0)
Monocytes Relative: 14 %
NEUTROS ABS: 3.6 10*3/uL (ref 1.7–7.7)
NEUTROS PCT: 64 %
Platelets: 193 10*3/uL (ref 150–400)
RBC: 2.54 MIL/uL — AB (ref 3.87–5.11)
RDW: 14.4 % (ref 11.5–15.5)
WBC: 5.6 10*3/uL (ref 4.0–10.5)

## 2017-09-16 LAB — MAGNESIUM: MAGNESIUM: 1.9 mg/dL (ref 1.7–2.4)

## 2017-09-16 LAB — COMPREHENSIVE METABOLIC PANEL
ALK PHOS: 105 U/L (ref 38–126)
ALT: 12 U/L — AB (ref 14–54)
AST: 17 U/L (ref 15–41)
Albumin: 2.4 g/dL — ABNORMAL LOW (ref 3.5–5.0)
Anion gap: 4 — ABNORMAL LOW (ref 5–15)
BUN: 34 mg/dL — AB (ref 6–20)
CALCIUM: 8.1 mg/dL — AB (ref 8.9–10.3)
CHLORIDE: 109 mmol/L (ref 101–111)
CO2: 24 mmol/L (ref 22–32)
CREATININE: 1.2 mg/dL — AB (ref 0.44–1.00)
GFR, EST AFRICAN AMERICAN: 58 mL/min — AB (ref >60)
GFR, EST NON AFRICAN AMERICAN: 50 mL/min — AB (ref >60)
Glucose, Bld: 100 mg/dL — ABNORMAL HIGH (ref 65–99)
Potassium: 5.7 mmol/L — ABNORMAL HIGH (ref 3.5–5.1)
Sodium: 137 mmol/L (ref 135–145)
Total Bilirubin: 0.2 mg/dL — ABNORMAL LOW (ref 0.3–1.2)
Total Protein: 6.4 g/dL — ABNORMAL LOW (ref 6.5–8.1)

## 2017-09-16 LAB — BASIC METABOLIC PANEL
Anion gap: 3 — ABNORMAL LOW (ref 5–15)
BUN: 33 mg/dL — AB (ref 6–20)
CALCIUM: 8.2 mg/dL — AB (ref 8.9–10.3)
CHLORIDE: 107 mmol/L (ref 101–111)
CO2: 27 mmol/L (ref 22–32)
CREATININE: 1.14 mg/dL — AB (ref 0.44–1.00)
GFR calc non Af Amer: 53 mL/min — ABNORMAL LOW (ref >60)
Glucose, Bld: 150 mg/dL — ABNORMAL HIGH (ref 65–99)
Potassium: 5.8 mmol/L — ABNORMAL HIGH (ref 3.5–5.1)
SODIUM: 137 mmol/L (ref 135–145)

## 2017-09-16 LAB — TROPONIN I
Troponin I: <0.03 ng/mL (ref <0.03)
Troponin I: <0.03 ng/mL (ref <0.03)

## 2017-09-16 LAB — ECHOCARDIOGRAM COMPLETE
Height: 63 in
WEIGHTICAEL: 5047.65 [oz_av]

## 2017-09-16 LAB — HIV ANTIBODY (ROUTINE TESTING W REFLEX): HIV SCREEN 4TH GENERATION: NONREACTIVE

## 2017-09-16 LAB — HEMOGLOBIN AND HEMATOCRIT, BLOOD
HCT: 25.3 % — ABNORMAL LOW (ref 36.0–46.0)
HEMOGLOBIN: 8 g/dL — AB (ref 12.0–15.0)

## 2017-09-16 LAB — TSH: TSH: 2.222 u[IU]/mL (ref 0.350–4.500)

## 2017-09-16 LAB — GLUCOSE, CAPILLARY: GLUCOSE-CAPILLARY: 89 mg/dL (ref 65–99)

## 2017-09-16 MED ORDER — ORAL CARE MOUTH RINSE
15.0000 mL | Freq: Two times a day (BID) | OROMUCOSAL | Status: DC
  Administered 2017-09-18 – 2017-09-27 (×7): 15 mL via OROMUCOSAL

## 2017-09-16 MED ORDER — FLUTICASONE PROPIONATE 50 MCG/ACT NA SUSP
1.0000 | Freq: Every day | NASAL | Status: DC
  Administered 2017-09-16 – 2017-09-28 (×13): 1 via NASAL
  Filled 2017-09-16: qty 16

## 2017-09-16 MED ORDER — FUROSEMIDE 10 MG/ML IJ SOLN
80.0000 mg | Freq: Two times a day (BID) | INTRAMUSCULAR | Status: DC
  Administered 2017-09-16 – 2017-09-18 (×5): 80 mg via INTRAVENOUS
  Filled 2017-09-16 (×5): qty 8

## 2017-09-16 NOTE — Progress Notes (Signed)
PROGRESS NOTE    Marissa Wilkerson  OZH:[id number] DOB: Oct 11, 1960 DOA: 09/15/2017 PCP: Jani Gravel, MD   Brief Narrative:  Marissa Wilkerson is a 57 y.o. female with medical history significant of sarcoidosis and hypertension with no prior diagnosis of congestive heart failure that came from home with significant shortness of breath generalized swelling increasing lower extremity edema especially. Patient had an echocardiogram 2 years ago as a follow-up for her sarcoidosis. She has been on Lasix but not diagnosed with any CHF. She takes insulin for her diabetes as well as morbidly obese. She's been having progressive exertional dyspnea. Associated with cough. She has been unable to lie flat and had PND and orthopnea. Patient was notably fluid overloaded. She has been on medication for her sarcoidosis including methotrexate. She is having close follow-up with her pulmonologist. No known history of cardiac disease. Patient also was hyperkalemic  Assessment & Plan:   Principal Problem:   CHF (congestive heart failure), NYHA class I, unspecified failure chronicity, diastolic (HCC) Active Problems:   Chronic respiratory failure ([place])   Hypertension   Diabetes mellitus without complication (HCC)   Diastolic dysfunction   Sarcoidosis of lung (HCC)   Hyperlipidemia   Bilateral carotid artery disease (HCC)   Hyperkalemia   CHF (congestive heart failure) ([place])   # HFpEF Exacerbation: Echo from 2016  With normal EF, diastolic dysfunction.  Sounds like she's had progressive swelling in her abdomen and lower extremities over the past few weeks.  She's on ~4L of O2 at home related to sarcoid which has been stable.  Taking lasix 80 mg daily at home.  She had abdominal imaging which showed diffuse subcutaneous edema, small amount of ascites.   Continue lasix 80 IV BID Follow repeat echo Strict I/O, daily weights Will hold off on cardiology c/s right now pending results of echo HTN and sarcoid could be  contributing   #2 acute on chronic respiratory failure secondary to sarcoidosis.  Likely contribution from CHF above.  She notes she's on 4 L at home and is currently on 4 L.  Continue to monitor with diuresis. [ ]  she had several new nodules at the R lung base, possible related to sarcoid, attention recommended on short term follow up  # Acute Kidney Injury: creatinine at 1.3 on presentation, baseline less than 1.  Improving with diuresis.  Continue to monitor.     #3 diabetes: Patient is on Tuojeo at home.  Continue insulin with sliding scale insulin here. Hold oral meds  #4 morbid obesity: Dietary counseling once patient improves  #5 sarcoidosis: Methotrexate*weekly on Mondays. Patient will be initiated while in the hospital.  Follows with Dr. Lamonte Sakai for pulm manifestations.   #6 hyperkalemia: Patient has elevated potassium. S/p IV insulin with dextrose as well as repeat potassium in the ER showing improvement.  Lasix BID ordered.  Improving.  # Anemia: downtrending, but chronically low around 8.5.  No bleeding.  Follow iron, b12, folate, ferritin.   # L breast skin thickening: with subcutaneous edema, likely due to volume overload, but will need to be followed up  DVT prophylaxis: heparin Code Status: full  Family Communication: none at bedside Disposition Plan: pending improvement in resp status  Consultants:   none  Procedures:   Echo pending  Antimicrobials:   nne    Subjective: Progressive SOB over past few weeks. Progressive LE and abdominal swelling over past few weeks. SOB with activity. No blood in stool.  Objective: Vitals:   09/15/17 1959 09/15/17  2131 09/15/17 2233 09/16/17 0542  BP:  (!) 163/50 (!) 156/50 (!) 152/54  Pulse:  89 86 74  Resp:  19 20 20   Temp:   99.7 F (37.6 C) 98.8 F (37.1 C)  TempSrc:   Oral Oral  SpO2: 100% 97% 98% 99%  Weight:   (!) 143.3 kg (315 lb 14.7 oz) (!) 143.1 kg (315 lb 7.7 oz)  Height:   5\' 3"  (1.6 m)      Intake/Output Summary (Last 24 hours) at 09/16/2017 0900 Last data filed at 09/16/2017 0545 Gross per 24 hour  Intake 240 ml  Output 1100 ml  Net -860 ml   Filed Weights   09/15/17 1635 09/15/17 2233 09/16/17 0542  Weight: 136.1 kg (300 lb) (!) 143.3 kg (315 lb 14.7 oz) (!) 143.1 kg (315 lb 7.7 oz)    Examination:  General exam: Appears calm and comfortable  Respiratory system: Clear to auscultation. Respiratory effort normal. Cardiovascular system: S1 & S2 heard, RRR. No JVD, murmurs, rubs, gallops or clicks. No pedal edema. Gastrointestinal system: Abdomen is protuberant, soft and nontender. No organomegaly or masses felt. Normal bowel sounds heard. Central nervous system: Alert and oriented. No focal neurological deficits. Extremities: 2+ LEE bilaterally Skin: No rashes, lesions or ulcers Psychiatry: Judgement and insight appear normal. Mood & affect appropriate.     Data Reviewed: I have personally reviewed following labs and imaging studies  CBC: Recent Labs  Lab 09/15/17 1844 09/15/17 1905 09/16/17 0514  WBC 5.5  --  5.6  NEUTROABS 3.7  --  3.6  HGB 8.3* 8.5* 7.6*  HCT 27.1* 25.0* 25.3*  MCV 98.9  --  99.6  PLT 198  --  644   Basic Metabolic Panel: Recent Labs  Lab 09/15/17 1844 09/15/17 1905 09/15/17 2333  NA 139 139  --   K 6.7* 6.6*  --   CL 110 109  --   CO2 26  --   --   GLUCOSE 138* 136*  --   BUN 39* 45*  --   CREATININE 1.29* 1.30*  --   CALCIUM 8.5*  --   --   MG  --   --  1.9   GFR: Estimated Creatinine Clearance: 68.5 mL/min (A) (by C-G formula based on SCr of 1.3 mg/dL (H)). Liver Function Tests: Recent Labs  Lab 09/15/17 1844  AST 25  ALT 15  ALKPHOS 118  BILITOT 0.5  PROT 7.1  ALBUMIN 2.7*   No results for input(s): LIPASE, AMYLASE in the last 168 hours. No results for input(s): AMMONIA in the last 168 hours. Coagulation Profile: No results for input(s): INR, PROTIME in the last 168 hours. Cardiac Enzymes: Recent Labs   Lab 09/15/17 2333  TROPONINI <0.03   BNP (last 3 results) No results for input(s): PROBNP in the last 8760 hours. HbA1C: No results for input(s): HGBA1C in the last 72 hours. CBG: Recent Labs  Lab 09/15/17 1946 09/15/17 2200 09/15/17 2241  GLUCAP 114* 138* 150*   Lipid Profile: No results for input(s): CHOL, HDL, LDLCALC, TRIG, CHOLHDL, LDLDIRECT in the last 72 hours. Thyroid Function Tests: Recent Labs    09/15/17 2333  TSH 2.222   Anemia Panel: No results for input(s): VITAMINB12, FOLATE, FERRITIN, TIBC, IRON, RETICCTPCT in the last 72 hours. Sepsis Labs: No results for input(s): PROCALCITON, LATICACIDVEN in the last 168 hours.  No results found for this or any previous visit (from the past 240 hour(s)).       Radiology Studies:  Dg Chest 2 View  Result Date: 09/15/2017 CLINICAL DATA:  Shortness of breath EXAM: CHEST  2 VIEW COMPARISON:  Chest CT 06/13/2017 FINDINGS: Cardiomegaly and vascular pedicle widening that is chronic. Congested appearance of vessels with superimposed mild streaky density. Trace pleural effusions. No pneumothorax. IMPRESSION: 1. Cardiomegaly with vascular congestion and trace effusions. 2. History of sarcoidosis with mild scarring. Electronically Signed   By: Monte Fantasia M.D.   On: 09/15/2017 18:41   Ct Abdomen Pelvis W Contrast  Result Date: 09/15/2017 CLINICAL DATA:  Abdominal distension with pain for 2 weeks. EXAM: CT ABDOMEN AND PELVIS WITH CONTRAST TECHNIQUE: Multidetector CT imaging of the abdomen and pelvis was performed using the standard protocol following bolus administration of intravenous contrast. CONTRAST:  54mL ISOVUE-300 IOPAMIDOL (ISOVUE-300) INJECTION 61% COMPARISON:  Chest CT June 13, 2017 FINDINGS: Lower chest: There is nodularity in the lung bases. While a few tiny nodules in the left base are stable. There are a few new nodules in the right base including 11 mm nodule on series 6, image 3, a new nodule measuring 13 mm  on series 6, image 18, and a new nodule measuring 12 mm on series 6, image 31. A mosaic pattern of lung attenuation is identified and similar in the interval. Small pleural effusions are seen. There is skin thickening associated with the left breast there is increased attenuation in the subcutaneous fat of the abdomen, particularly in the left flank as seen on series 2, image 12. This extends through the level the pelvis. Hepatobiliary: The patient is status post cholecystectomy. No masses are seen in the liver. The portal vein is patent. The portal vein remains somewhat dilated but similar in caliber since the previous study, likely due to previous cholecystectomy. Pancreas: Unremarkable. No pancreatic ductal dilatation or surrounding inflammatory changes. Spleen: Normal in size without focal abnormality. Adrenals/Urinary Tract: Left adrenal nodularity is stable since October 2018, possibly small adenomas a nonspecific. No renal stones or hydronephrosis. No ureterectasis or ureteral stones. The bladder is normal. Stomach/Bowel: The stomach is normal in appearance as is the small bowel. The colon and appendix are normal. Vascular/Lymphatic: Mild atherosclerotic changes are seen in the abdominal aorta without aneurysm or dissection. Adenopathy is identified in the porta hepatis such as a node on series 2, image 28 measuring 17 mm. This node measured 18 mm on the comparison study. The nodes in the porta hepatis are essentially stable. There are prominent retroperitoneal nodes which are also stable within visualize limits. Reproductive: Uterus and bilateral adnexa are unremarkable. Other: There is increased attenuation in the fat of the abdomen as well as a small amount of fluid which may be due to volume overload as associated with the subcutaneous edema and small pleural effusions. Musculoskeletal: No acute or significant osseous findings. IMPRESSION: 1. There is nodularity in the lung bases. The nodularity in the  left base is stable. Several new nodules are seen in the right base. It is possible these nodules are due to the patient's known sarcoidosis but an infectious or neoplastic process is not excluded on this single study. Recommend attention on short-term follow-up. 2. Diffuse subcutaneous edema. Small pleural effusions. There is a tiny amount of ascites and increased attenuation in the fat of the abdomen and pelvis. The findings suggest volume overload. 3. Skin thickening associated with the left breast. The subcutaneous edema is worse on the left than the right and I suspect the breast finding is secondary to the diffuse process of edema. Recommend close clinical  follow-up. If the left breast skin thickening persists, recommend consultation in the breast Center. 4. Mild atherosclerotic change in the abdominal aorta. 5. Adenopathy in the abdomen is stable within visualized limits since October 2018 and may be either reactive or secondary to the patient's known sarcoidosis. Electronically Signed   By: Dorise Bullion III M.D   On: 09/15/2017 21:26        Scheduled Meds: . aspirin EC  81 mg Oral Daily  . atorvastatin  20 mg Oral Daily  . folic acid  1 mg Oral Daily  . furosemide  80 mg Intravenous BID  . gabapentin  300 mg Oral TID  . heparin  5,000 Units Subcutaneous Q8H  . insulin glargine  20 Units Subcutaneous BID  . linagliptin  5 mg Oral Daily  . lisinopril  10 mg Oral Daily  . mouth rinse  15 mL Mouth Rinse BID  . phenytoin  200 mg Oral BID  . sodium chloride flush  3 mL Intravenous Q12H   Continuous Infusions: . sodium chloride       LOS: 1 day    Time spent: over 30 min    Fayrene Helper, MD [facility] Pager [phone]  If 7PM-7AM, please contact night-coverage [email or web address] Password [email or web address] 09/16/2017, 9:00 AM

## 2017-09-16 NOTE — Progress Notes (Signed)
  Echocardiogram 2D Echocardiogram has been performed.  Jarmar Rousseau T Meliana Canner 09/16/2017, 12:28 PM

## 2017-09-17 ENCOUNTER — Encounter (HOSPITAL_COMMUNITY): Payer: Self-pay

## 2017-09-17 LAB — FOLATE: Folate: 44.4 ng/mL (ref >5.9)

## 2017-09-17 LAB — URINALYSIS, ROUTINE W REFLEX MICROSCOPIC
BILIRUBIN URINE: NEGATIVE
GLUCOSE, UA: 50 mg/dL — AB
KETONES UR: 5 mg/dL — AB
Nitrite: POSITIVE — AB
PH: 6 (ref 5.0–8.0)
Protein, ur: >=300 mg/dL — AB
SPECIFIC GRAVITY, URINE: 1.015 (ref 1.005–1.030)

## 2017-09-17 LAB — CBC
HEMATOCRIT: 26.8 % — AB (ref 36.0–46.0)
Hemoglobin: 8.3 g/dL — ABNORMAL LOW (ref 12.0–15.0)
MCH: 30.6 pg (ref 26.0–34.0)
MCHC: 31 g/dL (ref 30.0–36.0)
MCV: 98.9 fL (ref 78.0–100.0)
PLATELETS: 213 10*3/uL (ref 150–400)
RBC: 2.71 MIL/uL — AB (ref 3.87–5.11)
RDW: 14.4 % (ref 11.5–15.5)
WBC: 6.2 10*3/uL (ref 4.0–10.5)

## 2017-09-17 LAB — HEPATIC FUNCTION PANEL
ALBUMIN: 2.8 g/dL — AB (ref 3.5–5.0)
ALT: 14 U/L (ref 14–54)
AST: 19 U/L (ref 15–41)
Alkaline Phosphatase: 113 U/L (ref 38–126)
BILIRUBIN TOTAL: 0.2 mg/dL — AB (ref 0.3–1.2)
Bilirubin, Direct: <0.1 mg/dL — ABNORMAL LOW (ref 0.1–0.5)
TOTAL PROTEIN: 7 g/dL (ref 6.5–8.1)

## 2017-09-17 LAB — BASIC METABOLIC PANEL
Anion gap: 4 — ABNORMAL LOW (ref 5–15)
BUN: 26 mg/dL — ABNORMAL HIGH (ref 6–20)
CHLORIDE: 107 mmol/L (ref 101–111)
CO2: 26 mmol/L (ref 22–32)
CREATININE: 0.91 mg/dL (ref 0.44–1.00)
Calcium: 8.2 mg/dL — ABNORMAL LOW (ref 8.9–10.3)
Glucose, Bld: 107 mg/dL — ABNORMAL HIGH (ref 65–99)
POTASSIUM: 5.1 mmol/L (ref 3.5–5.1)
Sodium: 137 mmol/L (ref 135–145)

## 2017-09-17 LAB — GLUCOSE, CAPILLARY
GLUCOSE-CAPILLARY: 100 mg/dL — AB (ref 65–99)
Glucose-Capillary: 94 mg/dL (ref 65–99)
Glucose-Capillary: 99 mg/dL (ref 65–99)
Glucose-Capillary: 125 mg/dL — ABNORMAL HIGH (ref 65–99)

## 2017-09-17 LAB — FERRITIN: Ferritin: 93 ng/mL (ref 11–307)

## 2017-09-17 LAB — IRON AND TIBC
IRON: 46 ug/dL (ref 28–170)
Saturation Ratios: 18 % (ref 10.4–31.8)
TIBC: 253 ug/dL (ref 250–450)
UIBC: 207 ug/dL

## 2017-09-17 LAB — VITAMIN B12: Vitamin B-12: 1409 pg/mL — ABNORMAL HIGH (ref 180–914)

## 2017-09-17 MED ORDER — CARVEDILOL 6.25 MG PO TABS: 6.2500 mg | ORAL_TABLET | Freq: Two times a day (BID) | ORAL | Status: DC

## 2017-09-17 MED ORDER — DIPHENHYDRAMINE HCL 25 MG PO CAPS
25.0000 mg | ORAL_CAPSULE | Freq: Every evening | ORAL | Status: DC | PRN
  Administered 2017-09-23 – 2017-09-26 (×4): 50 mg via ORAL
  Filled 2017-09-17 (×4): qty 2

## 2017-09-17 MED ORDER — IPRATROPIUM-ALBUTEROL 0.5-2.5 (3) MG/3ML IN SOLN
3.0000 mL | Freq: Four times a day (QID) | RESPIRATORY_TRACT | Status: AC | PRN
Start: 2017-09-17 — End: 2017-09-17
  Administered 2017-09-17 (×2): 3 mL via RESPIRATORY_TRACT
  Filled 2017-09-17 (×2): qty 3

## 2017-09-17 MED ORDER — CARVEDILOL 6.25 MG PO TABS
6.2500 mg | ORAL_TABLET | Freq: Two times a day (BID) | ORAL | Status: DC
  Administered 2017-09-17 – 2017-09-28 (×22): 6.25 mg via ORAL
  Filled 2017-09-17 (×22): qty 1

## 2017-09-17 NOTE — Progress Notes (Addendum)
PROGRESS NOTE    Marissa Wilkerson  YKD:[id number] DOB: 1960-09-13 DOA: 09/15/2017 PCP: Jani Gravel, MD   Brief Narrative:  Marissa Wilkerson is a 57 y.o. female with medical history significant of sarcoidosis and hypertension with no prior diagnosis of congestive heart failure that came from home with significant shortness of breath generalized swelling increasing lower extremity edema especially. Patient had an echocardiogram 2 years ago as a follow-up for her sarcoidosis. She has been on Lasix but not diagnosed with any CHF. She takes insulin for her diabetes as well as morbidly obese. She's been having progressive exertional dyspnea. Associated with cough. She has been unable to lie flat and had PND and orthopnea. Patient was notably fluid overloaded. She has been on medication for her sarcoidosis including methotrexate. She is having close follow-up with her pulmonologist. No known history of cardiac disease. Patient also was hyperkalemic  Assessment & Plan:   Principal Problem:   CHF (congestive heart failure), NYHA class I, unspecified failure chronicity, diastolic (HCC) Active Problems:   Chronic respiratory failure ([place])   Hypertension   Diabetes mellitus without complication (HCC)   Diastolic dysfunction   Sarcoidosis of lung (HCC)   Hyperlipidemia   Bilateral carotid artery disease (HCC)   Hyperkalemia   CHF (congestive heart failure) ([place])   # HFpEF Exacerbation  Anasarca: Echo from 2016  With normal EF, diastolic dysfunction.  Sounds like she's had progressive swelling in her abdomen and lower extremities over the past few weeks.  She's on ~4L of O2 at home related to sarcoid which has been stable.  Taking lasix 80 mg daily at home.  She had abdominal imaging which showed diffuse subcutaneous edema, small amount of ascites.   Repeat echo from 09/16/17 shows moderate LVH.  Normal EF.  Normal diastolic dysfunction.  With elevated BNP and edema as well as subjective improvement with  diuresis, likely CHF exacerbation, but with anasarca and low albumin will check urine for protein.  She has good synthetic function of liver and no evidence of cirrhosis on imaging.   Weight at Dr. Agustina Caroli office visit in 05/2017 was 293 lbs (she's 315 lbs at presentation)   Continue lasix 80 IV BID Follow repeat echo Strict I/O, daily weights hold off on cardiology c/s for now HTN and sarcoid could be contributing   Filed Weights   09/15/17 2233 09/16/17 0542 09/17/17 0628  Weight: (!) 143.3 kg (315 lb 14.7 oz) (!) 143.1 kg (315 lb 7.7 oz) (!) 138.5 kg (305 lb 5.4 oz)    #2 acute on chronic respiratory failure secondary to sarcoidosis.  Likely contribution from CHF above.  She notes she's on 4 L at home and is currently on 4 L.  Continue to monitor with diuresis. [ ]  she had several new nodules at the R lung base, possible related to sarcoid, attention recommended on short term follow up  # Acute Kidney Injury: creatinine at 1.3 on presentation, baseline less than 1.  Improving with diuresis.  Continue to monitor.     # Hypertension: continue lisinopril for now, attention to potassium will be needed on follow up, but hopefully better with increased diuresis.  Adding carvedilol 6.25 BID for elevated BP's.  Continue IV lasix.   #3 diabetes: Patient is on Tuojeo at home.  Continue insulin with sliding scale insulin here. Hold oral meds  #4 morbid obesity: Dietary counseling once patient improves  #5 sarcoidosis: Her methotrexate has been discontinued.  Follows with Dr. Lamonte Sakai for pulm manifestations.   #  6 hyperkalemia: Patient has elevated potassium. S/p IV insulin with dextrose as well as repeat potassium in the ER showing improvement.  Lasix BID ordered.  Improving.  Likely related to lisinopril and AKI, ctm with diuresis.   # Anemia: downtrending, but chronically low around 8.5.  No bleeding.  Follow iron, b12, folate, ferritin.  Labs suggestive of AOCD.   # L breast skin  thickening: with subcutaneous edema, likely due to volume overload, but will need to be followed up.  No obvious abnormality on my exam today.  She notes she lays on her L side often.   DVT prophylaxis: heparin Code Status: full  Family Communication: none at bedside Disposition Plan: pending improvement in resp status  Consultants:   none  Procedures:  09/16/17 Study Conclusions  - Left ventricle: The cavity size was normal. Wall thickness was   increased in a pattern of moderate LVH. Systolic function was   normal. The estimated ejection fraction was in the range of 60%   to 65%. Wall motion was normal; there were no regional wall   motion abnormalities. Left ventricular diastolic function   parameters were normal. - Aortic valve: There was mild stenosis. Valve area (VTI): 2.36   cm^2. Valve area (Vmax): 2.34 cm^2. Valve area (Vmean): 2.24   cm^2. - Left atrium: The atrium was moderately dilated. - Atrial septum: No defect or patent foramen ovale was identified. - Pulmonary arteries: PA peak pressure: 38 mm Hg (S).   Antimicrobials:   nne    Subjective: Feels a bit better. Abdomen still feels swollen.   Objective: Vitals:   09/16/17 1448 09/16/17 2219 09/17/17 0300 09/17/17 0628  BP: (!) 146/41 (!) 175/72  (!) 141/46  Pulse: 70 77  76  Resp: 18 (!) 22  20  Temp: 97.9 F (36.6 C) 98.2 F (36.8 C)  98 F (36.7 C)  TempSrc: Oral Oral  Oral  SpO2: 100% 96% 97% 100%  Weight:    (!) 138.5 kg (305 lb 5.4 oz)  Height:        Intake/Output Summary (Last 24 hours) at 09/17/2017 1225 Last data filed at 09/17/2017 1100 Gross per 24 hour  Intake 120 ml  Output 2950 ml  Net -2830 ml   Filed Weights   09/15/17 2233 09/16/17 0542 09/17/17 0628  Weight: (!) 143.3 kg (315 lb 14.7 oz) (!) 143.1 kg (315 lb 7.7 oz) (!) 138.5 kg (305 lb 5.4 oz)    Examination:  General: No acute distress. Obese.  Cardiovascular: Heart sounds show a regular rate, and rhythm. No gallops  or rubs. No murmurs. No JVD. Lungs: Clear to auscultation bilaterally with good air movement. No rales, rhonchi or wheezes. Abdomen: Soft, protuberant, nondistended with normal active bowel sounds. Pitting edema.  Neurological: Alert and oriented 3. Moves all extremities 4 with equal strength. Cranial nerves II through XII grossly intact. Skin: Warm and dry. No rashes or lesions. Extremities: No clubbing or cyanosis. 2+ edema.  Psychiatric: Mood and affect are normal. Insight and judgment are appropriate.    Data Reviewed: I have personally reviewed following labs and imaging studies  CBC: Recent Labs  Lab 09/15/17 1844 09/15/17 1905 09/16/17 0514 09/16/17 1122 09/17/17 0523  WBC 5.5  --  5.6  --  6.2  NEUTROABS 3.7  --  3.6  --   --   HGB 8.3* 8.5* 7.6* 8.0* 8.3*  HCT 27.1* 25.0* 25.3* 25.3* 26.8*  MCV 98.9  --  99.6  --  98.9  PLT 198  --  193  --  660   Basic Metabolic Panel: Recent Labs  Lab 09/15/17 1844 09/15/17 1905 09/15/17 2333 09/16/17 0514 09/16/17 1530 09/17/17 0523  NA 139 139  --  137 137 137  K 6.7* 6.6*  --  5.7* 5.8* 5.1  CL 110 109  --  109 107 107  CO2 26  --   --  24 27 26   GLUCOSE 138* 136*  --  100* 150* 107*  BUN 39* 45*  --  34* 33* 26*  CREATININE 1.29* 1.30*  --  1.20* 1.14* 0.91  CALCIUM 8.5*  --   --  8.1* 8.2* 8.2*  MG  --   --  1.9  --   --   --    GFR: Estimated Creatinine Clearance: 95.7 mL/min (by C-G formula based on SCr of 0.91 mg/dL). Liver Function Tests: Recent Labs  Lab 09/15/17 1844 09/16/17 0514 09/17/17 0523  AST 25 17 19   ALT 15 12* 14  ALKPHOS 118 105 113  BILITOT 0.5 0.2* 0.2*  PROT 7.1 6.4* 7.0  ALBUMIN 2.7* 2.4* 2.8*   No results for input(s): LIPASE, AMYLASE in the last 168 hours. No results for input(s): AMMONIA in the last 168 hours. Coagulation Profile: No results for input(s): INR, PROTIME in the last 168 hours. Cardiac Enzymes: Recent Labs  Lab 09/15/17 2333 09/16/17 0514 09/16/17 1122    TROPONINI <0.03 <0.03 <0.03   BNP (last 3 results) No results for input(s): PROBNP in the last 8760 hours. HbA1C: No results for input(s): HGBA1C in the last 72 hours. CBG: Recent Labs  Lab 09/15/17 2200 09/15/17 2241 09/16/17 2226 09/17/17 0751 09/17/17 1131  GLUCAP 138* 150* 89 94 99   Lipid Profile: No results for input(s): CHOL, HDL, LDLCALC, TRIG, CHOLHDL, LDLDIRECT in the last 72 hours. Thyroid Function Tests: Recent Labs    09/15/17 2333  TSH 2.222   Anemia Panel: Recent Labs    09/16/17 1530  VITAMINB12 1,409*  FOLATE 44.4  FERRITIN 93  TIBC 253  IRON 46   Sepsis Labs: No results for input(s): PROCALCITON, LATICACIDVEN in the last 168 hours.  No results found for this or any previous visit (from the past 240 hour(s)).       Radiology Studies: Dg Chest 2 View  Result Date: 09/15/2017 CLINICAL DATA:  Shortness of breath EXAM: CHEST  2 VIEW COMPARISON:  Chest CT 06/13/2017 FINDINGS: Cardiomegaly and vascular pedicle widening that is chronic. Congested appearance of vessels with superimposed mild streaky density. Trace pleural effusions. No pneumothorax. IMPRESSION: 1. Cardiomegaly with vascular congestion and trace effusions. 2. History of sarcoidosis with mild scarring. Electronically Signed   By: Monte Fantasia M.D.   On: 09/15/2017 18:41   Ct Abdomen Pelvis W Contrast  Result Date: 09/15/2017 CLINICAL DATA:  Abdominal distension with pain for 2 weeks. EXAM: CT ABDOMEN AND PELVIS WITH CONTRAST TECHNIQUE: Multidetector CT imaging of the abdomen and pelvis was performed using the standard protocol following bolus administration of intravenous contrast. CONTRAST:  66mL ISOVUE-300 IOPAMIDOL (ISOVUE-300) INJECTION 61% COMPARISON:  Chest CT June 13, 2017 FINDINGS: Lower chest: There is nodularity in the lung bases. While a few tiny nodules in the left base are stable. There are a few new nodules in the right base including 11 mm nodule on series 6, image 3, a  new nodule measuring 13 mm on series 6, image 18, and a new nodule measuring 12 mm on series 6, image 31. A mosaic  pattern of lung attenuation is identified and similar in the interval. Small pleural effusions are seen. There is skin thickening associated with the left breast there is increased attenuation in the subcutaneous fat of the abdomen, particularly in the left flank as seen on series 2, image 12. This extends through the level the pelvis. Hepatobiliary: The patient is status post cholecystectomy. No masses are seen in the liver. The portal vein is patent. The portal vein remains somewhat dilated but similar in caliber since the previous study, likely due to previous cholecystectomy. Pancreas: Unremarkable. No pancreatic ductal dilatation or surrounding inflammatory changes. Spleen: Normal in size without focal abnormality. Adrenals/Urinary Tract: Left adrenal nodularity is stable since October 2018, possibly small adenomas a nonspecific. No renal stones or hydronephrosis. No ureterectasis or ureteral stones. The bladder is normal. Stomach/Bowel: The stomach is normal in appearance as is the small bowel. The colon and appendix are normal. Vascular/Lymphatic: Mild atherosclerotic changes are seen in the abdominal aorta without aneurysm or dissection. Adenopathy is identified in the porta hepatis such as a node on series 2, image 28 measuring 17 mm. This node measured 18 mm on the comparison study. The nodes in the porta hepatis are essentially stable. There are prominent retroperitoneal nodes which are also stable within visualize limits. Reproductive: Uterus and bilateral adnexa are unremarkable. Other: There is increased attenuation in the fat of the abdomen as well as a small amount of fluid which may be due to volume overload as associated with the subcutaneous edema and small pleural effusions. Musculoskeletal: No acute or significant osseous findings. IMPRESSION: 1. There is nodularity in the lung  bases. The nodularity in the left base is stable. Several new nodules are seen in the right base. It is possible these nodules are due to the patient's known sarcoidosis but an infectious or neoplastic process is not excluded on this single study. Recommend attention on short-term follow-up. 2. Diffuse subcutaneous edema. Small pleural effusions. There is a tiny amount of ascites and increased attenuation in the fat of the abdomen and pelvis. The findings suggest volume overload. 3. Skin thickening associated with the left breast. The subcutaneous edema is worse on the left than the right and I suspect the breast finding is secondary to the diffuse process of edema. Recommend close clinical follow-up. If the left breast skin thickening persists, recommend consultation in the breast Center. 4. Mild atherosclerotic change in the abdominal aorta. 5. Adenopathy in the abdomen is stable within visualized limits since October 2018 and may be either reactive or secondary to the patient's known sarcoidosis. Electronically Signed   By: Dorise Bullion III M.D   On: 09/15/2017 21:26        Scheduled Meds: . aspirin EC  81 mg Oral Daily  . atorvastatin  20 mg Oral Daily  . fluticasone  1 spray Each Nare Daily  . folic acid  1 mg Oral Daily  . furosemide  80 mg Intravenous BID  . gabapentin  300 mg Oral TID  . heparin  5,000 Units Subcutaneous Q8H  . insulin glargine  20 Units Subcutaneous BID  . lisinopril  10 mg Oral Daily  . mouth rinse  15 mL Mouth Rinse BID  . phenytoin  200 mg Oral BID  . sodium chloride flush  3 mL Intravenous Q12H   Continuous Infusions: . sodium chloride       LOS: 2 days    Time spent: over 30 min    Fayrene Helper, MD [facility] Pager  [phone]  If 7PM-7AM, please contact night-coverage [email or web address] Password [facility] 09/17/2017, 12:25 PM

## 2017-09-17 NOTE — Evaluation (Signed)
Physical Therapy Evaluation Patient Details Name: Marissa Wilkerson MRN: [id number] DOB: [birth year] Today's Date: 09/17/2017   History of Present Illness  Marissa Wilkerson is a 57 y.o. female with medical history significant of sarcoidosis and hypertension with no prior diagnosis of congestive heart failure that came from home with significant shortness of breath generalized swelling increasing lower extremity edema   Clinical Impression  The patient is quite compromised with DOE and desaturation with minimal activity. Pt admitted with above diagnosis. Pt currently with functional limitations due to the deficits listed below (see PT Problem List).  Pt will benefit from skilled PT to increase their independence and safety with mobility to allow discharge to the venue listed below.       Follow Up Recommendations Home health PT    Equipment Recommendations  None recommended by PT    Recommendations for Other Services OT consult     Precautions / Restrictions Precautions Precaution Comments: DOE, on 4 L.      Mobility  Bed Mobility Overal bed mobility: Independent                Transfers Overall transfer level: Needs assistance Equipment used: Rolling walker (2 wheeled);None Transfers: Sit to/from Stand Sit to Stand: Min guard         General transfer comment: extra time due to SOB,   Ambulation/Gait Ambulation/Gait assistance: Min guard Ambulation Distance (Feet): 10 Feet Assistive device: Rolling walker (2 wheeled) Gait Pattern/deviations: Step-through pattern     General Gait Details: very limited in distance, noted wheezing, sats  dropped to 83% on 4L/, HR 91.  Stairs            Wheelchair Mobility    Modified Rankin (Stroke Patients Only)       Balance                                             Pertinent Vitals/Pain Pain Assessment: No/denies pain    Home Living Family/patient expects to be discharged to:: Private  residence Living Arrangements: Spouse/significant other Available Help at Discharge: Family Type of Home: House Home Access: Stairs to enter   Technical brewer of Steps: 1 Home Layout: One level Home Equipment: Environmental consultant - 2 wheels;Bedside commode;Tub bench;Wheelchair - manual      Prior Function Level of Independence: Needs assistance   Gait / Transfers Assistance Needed: uses WC to get o car, limited distance.  ADL's / Homemaking Assistance Needed: reports bath is exhausting, even sitting        Hand Dominance        Extremity/Trunk Assessment   Upper Extremity Assessment Upper Extremity Assessment: Defer to OT evaluation    Lower Extremity Assessment Lower Extremity Assessment: Generalized weakness    Cervical / Trunk Assessment Cervical / Trunk Assessment: Normal  Communication   Communication: No difficulties  Cognition Arousal/Alertness: Awake/alert Behavior During Therapy: WFL for tasks assessed/performed Overall Cognitive Status: Within Functional Limits for tasks assessed                                        General Comments      Exercises     Assessment/Plan    PT Assessment Patient needs continued PT services  PT Problem List Decreased activity tolerance;Cardiopulmonary status limiting activity  PT Treatment Interventions DME instruction;Gait training;Functional mobility training;Patient/family education    PT Goals (Current goals can be found in the Care Plan section)  Acute Rehab PT Goals Patient Stated Goal: to be able to do for myself PT Goal Formulation: With patient Time For Goal Achievement: 10/01/17 Potential to Achieve Goals: Good    Frequency Min 3X/week   Barriers to discharge        Co-evaluation               AM-PAC PT "6 Clicks" Daily Activity  Outcome Measure Difficulty turning over in bed (including adjusting bedclothes, sheets and blankets)?: None Difficulty moving from lying on  back to sitting on the side of the bed? : None Difficulty sitting down on and standing up from a chair with arms (e.g., wheelchair, bedside commode, etc,.)?: A Little Help needed moving to and from a bed to chair (including a wheelchair)?: A Little Help needed walking in hospital room?: A Little Help needed climbing 3-5 steps with a railing? : A Little 6 Click Score: 20    End of Session   Activity Tolerance: Treatment limited secondary to medical complications (Comment) Patient left: in bed Nurse Communication: Mobility status PT Visit Diagnosis: Unsteadiness on feet (R26.81)    Time: 4628-6381 PT Time Calculation (min) (ACUTE ONLY): 14 min   Charges:   PT Evaluation $PT Eval Low Complexity: 1 Low     PT G CodesTresa Endo PT [phone]    Claretha Cooper 09/17/2017, 9:09 AM

## 2017-09-18 LAB — PROTEIN / CREATININE RATIO, URINE
CREATININE, URINE: 78.93 mg/dL
PROTEIN CREATININE RATIO: 7.91 mg/mg{creat} — AB (ref 0.00–0.15)
Total Protein, Urine: 624 mg/dL

## 2017-09-18 LAB — CBC
HCT: 26.3 % — ABNORMAL LOW (ref 36.0–46.0)
Hemoglobin: 8.1 g/dL — ABNORMAL LOW (ref 12.0–15.0)
MCH: 30.3 pg (ref 26.0–34.0)
MCHC: 30.8 g/dL (ref 30.0–36.0)
MCV: 98.5 fL (ref 78.0–100.0)
PLATELETS: 190 10*3/uL (ref 150–400)
RBC: 2.67 MIL/uL — AB (ref 3.87–5.11)
RDW: 14.3 % (ref 11.5–15.5)
WBC: 5.7 10*3/uL (ref 4.0–10.5)

## 2017-09-18 LAB — BASIC METABOLIC PANEL
Anion gap: 3 — ABNORMAL LOW (ref 5–15)
BUN: 23 mg/dL — AB (ref 6–20)
CALCIUM: 7.8 mg/dL — AB (ref 8.9–10.3)
CHLORIDE: 105 mmol/L (ref 101–111)
CO2: 29 mmol/L (ref 22–32)
CREATININE: 0.95 mg/dL (ref 0.44–1.00)
Glucose, Bld: 107 mg/dL — ABNORMAL HIGH (ref 65–99)
Potassium: 4.7 mmol/L (ref 3.5–5.1)
SODIUM: 137 mmol/L (ref 135–145)

## 2017-09-18 LAB — MAGNESIUM: Magnesium: 1.9 mg/dL (ref 1.7–2.4)

## 2017-09-18 LAB — GLUCOSE, CAPILLARY
GLUCOSE-CAPILLARY: 119 mg/dL — AB (ref 65–99)
GLUCOSE-CAPILLARY: 122 mg/dL — AB (ref 65–99)
Glucose-Capillary: 88 mg/dL (ref 65–99)
Glucose-Capillary: 109 mg/dL — ABNORMAL HIGH (ref 65–99)

## 2017-09-18 MED ORDER — FUROSEMIDE 40 MG PO TABS
80.0000 mg | ORAL_TABLET | Freq: Two times a day (BID) | ORAL | Status: DC
  Administered 2017-09-19: 80 mg via ORAL
  Filled 2017-09-18: qty 2

## 2017-09-18 MED ORDER — CLONIDINE HCL 0.1 MG PO TABS
0.1000 mg | ORAL_TABLET | Freq: Once | ORAL | Status: AC
  Administered 2017-09-18: 0.1 mg via ORAL
  Filled 2017-09-18: qty 1

## 2017-09-18 NOTE — Progress Notes (Signed)
OT Cancellation Note  Patient Details Name: SANTASIA REW MRN: [id number] DOB: [birth year]   Cancelled Treatment:    Reason Eval/Treat Not Completed: Other (comment). Another staff member is with pt. Will check back either later today or tomorrow.  Drayson Dorko 09/18/2017, 2:46 PM  Lesle Chris, OTR/L [phone] 09/18/2017

## 2017-09-18 NOTE — Progress Notes (Signed)
Patient's B/P was 180/71.  PCP was notified.

## 2017-09-18 NOTE — Care Management Important Message (Signed)
Important Message  Patient Details  Name: Marissa Wilkerson MRN: [id number] Date of Birth: Jan 07, 1961   Medicare Important Message Given:  Yes    Kerin Salen 09/18/2017, 11:26 AMImportant Message  Patient Details  Name: Marissa Wilkerson MRN: [id number] Date of Birth: [birth year]   Medicare Important Message Given:  Yes    Kerin Salen 09/18/2017, 11:26 AM

## 2017-09-18 NOTE — Progress Notes (Signed)
PROGRESS NOTE    [name]  YNW:[id number] DOB: [birth year] DOA: 09/15/2017 PCP: Jani Gravel, MD   Brief Narrative:  Marissa Wilkerson is Marissa Wilkerson 57 y.o. female with medical history significant of sarcoidosis and hypertension with no prior diagnosis of congestive heart failure that came from home with significant shortness of breath generalized swelling increasing lower extremity edema especially. Patient had an echocardiogram 2 years ago as [facility] follow-up for her sarcoidosis. She has been on Lasix but not diagnosed with any CHF. She takes insulin for her diabetes as well as morbidly obese. She's been having progressive exertional dyspnea. Associated with cough. She has been unable to lie flat and had PND and orthopnea. Patient was notably fluid overloaded. She has been on medication for her sarcoidosis including methotrexate. She is having close follow-up with her pulmonologist. No known history of cardiac disease. Patient also was hyperkalemic  Assessment & Plan:   Principal Problem:   CHF (congestive heart failure), NYHA class I, unspecified failure chronicity, diastolic (HCC) Active Problems:   Chronic respiratory failure ([place])   Hypertension   Diabetes mellitus without complication (HCC)   Diastolic dysfunction   Sarcoidosis of lung (HCC)   Hyperlipidemia   Bilateral carotid artery disease (HCC)   Hyperkalemia   CHF (congestive heart failure) ([place])   # HFpEF Exacerbation  Anasarca  Nephrotic Syndrome: Echo from 2016  With normal EF, diastolic dysfunction.  Sounds like she's had progressive swelling in her abdomen and lower extremities over the past few weeks.  She's on ~4L of O2 at home related to sarcoid which has been stable.  Taking lasix 80 mg daily at home.  She had abdominal imaging which showed diffuse subcutaneous edema, small amount of ascites.   Repeat echo from 09/16/17 shows moderate LVH.  Normal EF.  Normal diastolic dysfunction.  With elevated BNP and edema, likely component of CHF  exacerbation, but with anasarca and low albumin checked urine for protein which was notable for UP/C of 7.91 which is suggestive of nephrotic syndrome.  She has good synthetic function of liver and no evidence of cirrhosis on imaging.   Weight at Dr. Agustina Caroli office visit in 05/2017 was 293 lbs (she's 315 lbs at presentation)   Continue lasix 80 IV BID -> transition to PO BID tomorrow and see how she does on this Follow repeat echo Strict I/O, daily weights hold off on cardiology c/s for now Will need outpatient nephrology follow up HTN and sarcoid could be contributing   Filed Weights   09/16/17 0542 09/17/17 0628 09/18/17 0511  Weight: (!) 143.1 kg (315 lb 7.7 oz) (!) 138.5 kg (305 lb 5.4 oz) (!) 137.9 kg (304 lb 0.2 oz)    #2 acute on chronic respiratory failure secondary to sarcoidosis.  Likely contribution from CHF above.  She notes she's on 4 L at home and is currently on 4 L.  Continue to monitor with diuresis. [ ]  she had several new nodules at the R lung base, possible related to sarcoid, attention recommended on short term follow up  # Acute Kidney Injury: creatinine at 1.3 on presentation, baseline less than 1.  Improving with diuresis.  Continue to monitor.     # Hypertension: continue lisinopril for now, attention to potassium will be needed on follow up, but hopefully better with increased diuresis.  Adding carvedilol 6.25 BID for elevated BP's.  Continue IV lasix.   #3 diabetes: Patient is on [place] at home.  Continue insulin with sliding scale insulin here.  Hold oral meds  #4 morbid obesity: dietician c/s  #5 sarcoidosis: Her methotrexate has been discontinued.  Follows with Dr. Lamonte Sakai for pulm manifestations.   #6 hyperkalemia: Patient has elevated potassium. S/p IV insulin with dextrose as well as repeat potassium in the ER showing improvement.  Lasix BID ordered.  Improving.  Likely related to lisinopril and AKI, ctm with diuresis.  She'll need follow up as  outpatient, attention to lisinopril on follow up.   # Anemia: stable.  No bleeding.  Follow iron, b12, folate, ferritin.  Labs suggestive of AOCD.   # L breast skin thickening: with subcutaneous edema, likely due to volume overload, but will need to be followed up.  No obvious abnormality on my exam today.  She notes she lays on her L side often.   DVT prophylaxis: heparin Code Status: full  Family Communication: none at bedside Disposition Plan: pending improvement in resp status  Consultants:   none  Procedures:  09/16/17 Study Conclusions  - Left ventricle: The cavity size was normal. Wall thickness was   increased in Caralynn Gelber pattern of moderate LVH. Systolic function was   normal. The estimated ejection fraction was in the range of 60%   to 65%. Wall motion was normal; there were no regional wall   motion abnormalities. Left ventricular diastolic function   parameters were normal. - Aortic valve: There was mild stenosis. Valve area (VTI): 2.36   cm^2. Valve area (Vmax): 2.34 cm^2. Valve area (Vmean): 2.24   cm^2. - Left atrium: The atrium was moderately dilated. - Atrial septum: No defect or patent foramen ovale was identified. - Pulmonary arteries: PA peak pressure: 38 mm Hg (S).   Antimicrobials:   nne    Subjective: Feels Aerie Donica bit better. Abdomen still feels swollen.   Objective: Vitals:   09/17/17 2103 09/18/17 0511 09/18/17 0800 09/18/17 1300  BP: (!) 154/43 (!) 128/50 (!) 134/43 (!) 157/64  Pulse: 65 69 70 71  Resp: 20 20 20 20   Temp: 98.3 F (36.8 C) 98.2 F (36.8 C) 98.9 F (37.2 C) 98.2 F (36.8 C)  TempSrc: Oral Oral Oral Oral  SpO2: 98% 99% 100% 99%  Weight:  (!) 137.9 kg (304 lb 0.2 oz)    Height:        Intake/Output Summary (Last 24 hours) at 09/18/2017 1522 Last data filed at 09/18/2017 1500 Gross per 24 hour  Intake 1040 ml  Output 2250 ml  Net -1210 ml   Filed Weights   09/16/17 0542 09/17/17 0628 09/18/17 0511  Weight: (!) 143.1 kg (315  lb 7.7 oz) (!) 138.5 kg (305 lb 5.4 oz) (!) 137.9 kg (304 lb 0.2 oz)    Examination:  General: No acute distress. Cardiovascular: Heart sounds show Ronny Ruddell regular rate, and rhythm. No gallops or rubs. No murmurs. No JVD. Lungs: Clear to auscultation bilaterally with good air movement. No rales, rhonchi or wheezes. Abdomen: Soft, protuberant, nondistended with normal active bowel sounds. No masses. No hepatosplenomegaly.  Pitting edema Neurological: Alert and oriented 3. Moves all extremities 4 with equal strength. Cranial nerves II through XII grossly intact. Skin: Warm and dry. No rashes or lesions. Extremities: No clubbing or cyanosis. 2+ LEE edema.  Psychiatric: Mood and affect are normal. Insight and judgment are appropriate.   Data Reviewed: I have personally reviewed following labs and imaging studies  CBC: Recent Labs  Lab 09/15/17 1844 09/15/17 1905 09/16/17 0514 09/16/17 1122 09/17/17 0523 09/18/17 0613  WBC 5.5  --  5.6  --  6.2 5.7  NEUTROABS 3.7  --  3.6  --   --   --   HGB 8.3* 8.5* 7.6* 8.0* 8.3* 8.1*  HCT 27.1* 25.0* 25.3* 25.3* 26.8* 26.3*  MCV 98.9  --  99.6  --  98.9 98.5  PLT 198  --  193  --  213 423   Basic Metabolic Panel: Recent Labs  Lab 09/15/17 1844 09/15/17 1905 09/15/17 2333 09/16/17 0514 09/16/17 1530 09/17/17 0523 09/18/17 0613  NA 139 139  --  137 137 137 137  K 6.7* 6.6*  --  5.7* 5.8* 5.1 4.7  CL 110 109  --  109 107 107 105  CO2 26  --   --  24 27 26 29   GLUCOSE 138* 136*  --  100* 150* 107* 107*  BUN 39* 45*  --  34* 33* 26* 23*  CREATININE 1.29* 1.30*  --  1.20* 1.14* 0.91 0.95  CALCIUM 8.5*  --   --  8.1* 8.2* 8.2* 7.8*  MG  --   --  1.9  --   --   --  1.9   GFR: Estimated Creatinine Clearance: 91.5 mL/min (by C-G formula based on SCr of 0.95 mg/dL). Liver Function Tests: Recent Labs  Lab 09/15/17 1844 09/16/17 0514 09/17/17 0523  AST 25 17 19   ALT 15 12* 14  ALKPHOS 118 105 113  BILITOT 0.5 0.2* 0.2*  PROT 7.1 6.4*  7.0  ALBUMIN 2.7* 2.4* 2.8*   No results for input(s): LIPASE, AMYLASE in the last 168 hours. No results for input(s): AMMONIA in the last 168 hours. Coagulation Profile: No results for input(s): INR, PROTIME in the last 168 hours. Cardiac Enzymes: Recent Labs  Lab 09/15/17 2333 09/16/17 0514 09/16/17 1122  TROPONINI <0.03 <0.03 <0.03   BNP (last 3 results) No results for input(s): PROBNP in the last 8760 hours. HbA1C: No results for input(s): HGBA1C in the last 72 hours. CBG: Recent Labs  Lab 09/17/17 1131 09/17/17 1716 09/17/17 2049 09/18/17 0741 09/18/17 1151  GLUCAP 99 125* 100* 88 119*   Lipid Profile: No results for input(s): CHOL, HDL, LDLCALC, TRIG, CHOLHDL, LDLDIRECT in the last 72 hours. Thyroid Function Tests: Recent Labs    09/15/17 2333  TSH 2.222   Anemia Panel: Recent Labs    09/16/17 1530  VITAMINB12 1,409*  FOLATE 44.4  FERRITIN 93  TIBC 253  IRON 46   Sepsis Labs: No results for input(s): PROCALCITON, LATICACIDVEN in the last 168 hours.  No results found for this or any previous visit (from the past 240 hour(s)).       Radiology Studies: No results found.      Scheduled Meds: . aspirin EC  81 mg Oral Daily  . atorvastatin  20 mg Oral Daily  . carvedilol  6.25 mg Oral BID WC  . fluticasone  1 spray Each Nare Daily  . folic acid  1 mg Oral Daily  . furosemide  80 mg Intravenous BID  . gabapentin  300 mg Oral TID  . heparin  5,000 Units Subcutaneous Q8H  . insulin glargine  20 Units Subcutaneous BID  . lisinopril  10 mg Oral Daily  . mouth rinse  15 mL Mouth Rinse BID  . phenytoin  200 mg Oral BID  . sodium chloride flush  3 mL Intravenous Q12H   Continuous Infusions: . sodium chloride       LOS: 3 days    Time spent: over 30 min  Fayrene Helper, MD [facility] Pager [phone]  If 7PM-7AM, please contact night-coverage [email or web address] Password [facility] 09/18/2017, 3:22 PM

## 2017-09-18 NOTE — Progress Notes (Signed)
Nutrition Education Note  RD consulted for nutrition education regarding new onset CHF and weight loss.   RD provided "Low Sodium Nutrition Therapy" handout from the Academy of [employer]. Reviewed patient's dietary recall. Discouraged intake of processed foods and use of salt shaker.   RD discussed why it is important for patient to adhere to diet recommendations, and emphasized the role of fluids, foods to avoid, and importance of weighing self daily. Teach back method used.  Expect poor compliance.  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 States since being decreasingly mobile her and her husband have been eating more take out foods than normal. She is aware of types of food to avoid and states " I just have to do it." Discussed portion sizes and wt loss briefly.   Body mass index is 53.85 kg/m. Pt meets criteria for morbidly obese based on current BMI.  Current diet order is HH/CM, patient is consuming approximately 75-100% of meals at this time. Labs and medications reviewed. No further nutrition interventions warranted at this time. RD contact information provided. If additional nutrition issues arise, please re-consult RD.   Mariana Single RD, LDN Clinical Nutrition Pager # [phone]

## 2017-09-19 LAB — CBC
HCT: 25.2 % — ABNORMAL LOW (ref 36.0–46.0)
Hemoglobin: 7.8 g/dL — ABNORMAL LOW (ref 12.0–15.0)
MCH: 30.2 pg (ref 26.0–34.0)
MCHC: 31 g/dL (ref 30.0–36.0)
MCV: 97.7 fL (ref 78.0–100.0)
PLATELETS: 187 10*3/uL (ref 150–400)
RBC: 2.58 MIL/uL — AB (ref 3.87–5.11)
RDW: 14.5 % (ref 11.5–15.5)
WBC: 6.1 10*3/uL (ref 4.0–10.5)

## 2017-09-19 LAB — GLUCOSE, CAPILLARY
Glucose-Capillary: 121 mg/dL — ABNORMAL HIGH (ref 65–99)
Glucose-Capillary: 146 mg/dL — ABNORMAL HIGH (ref 65–99)
Glucose-Capillary: 153 mg/dL — ABNORMAL HIGH (ref 65–99)

## 2017-09-19 LAB — BASIC METABOLIC PANEL
Anion gap: 2 — ABNORMAL LOW (ref 5–15)
BUN: 23 mg/dL — ABNORMAL HIGH (ref 6–20)
CALCIUM: 7.8 mg/dL — AB (ref 8.9–10.3)
CHLORIDE: 106 mmol/L (ref 101–111)
CO2: 30 mmol/L (ref 22–32)
CREATININE: 0.97 mg/dL (ref 0.44–1.00)
GFR calc non Af Amer: >60 mL/min (ref >60)
GLUCOSE: 131 mg/dL — AB (ref 65–99)
Potassium: 4.7 mmol/L (ref 3.5–5.1)
Sodium: 138 mmol/L (ref 135–145)

## 2017-09-19 MED ORDER — FUROSEMIDE 40 MG PO TABS
40.0000 mg | ORAL_TABLET | Freq: Once | ORAL | Status: AC
  Administered 2017-09-19: 40 mg via ORAL
  Filled 2017-09-19: qty 1

## 2017-09-19 MED ORDER — ALBUTEROL SULFATE (2.5 MG/3ML) 0.083% IN NEBU
2.5000 mg | INHALATION_SOLUTION | RESPIRATORY_TRACT | Status: DC | PRN
  Administered 2017-09-19 (×2): 2.5 mg via RESPIRATORY_TRACT
  Filled 2017-09-19: qty 3

## 2017-09-19 MED ORDER — FUROSEMIDE 40 MG PO TABS
120.0000 mg | ORAL_TABLET | Freq: Two times a day (BID) | ORAL | Status: DC
  Administered 2017-09-19 – 2017-09-20 (×2): 120 mg via ORAL
  Filled 2017-09-19 (×2): qty 3

## 2017-09-19 NOTE — Progress Notes (Signed)
   09/19/17 1400  PT Visit Information  Last PT Received On 09/19/17  Reason Eval/Treat Not Completed Fatigue/lethargy limiting ability to participate

## 2017-09-19 NOTE — Progress Notes (Signed)
Assumed care of patient at this time. Patient is stable with no complaints at this time. Agree with previously documented assessment. Will continue to monitor patient.   

## 2017-09-19 NOTE — Progress Notes (Signed)
Patient had an episode of SOB, wanted a nebulizer treatment. Albuterol was given.

## 2017-09-19 NOTE — Progress Notes (Signed)
Physical Therapy Treatment Patient Details Name: Marissa Wilkerson MRN: [id number] DOB: 01-06-1961 Today's Date: 09/19/2017    History of Present Illness Marissa Wilkerson is a 57 y.o. female with medical history significant of sarcoidosis and hypertension with no prior diagnosis of congestive heart failure that came from home with significant shortness of breath generalized swelling increasing lower extremity edema     PT Comments    Pt progressing,incr tol to activity/DOE improved this pm; encouraged OOB   Follow Up Recommendations  Home health PT     Equipment Recommendations  None recommended by PT    Recommendations for Other Services       Precautions / Restrictions Precautions Precaution Comments: DOE, on 4 L.    Mobility  Bed Mobility Overal bed mobility: Independent                Transfers Overall transfer level: Needs assistance Equipment used: Rolling walker (2 wheeled);None Transfers: Sit to/from Stand Sit to Stand: Min guard         General transfer comment: extra time due to DOE although improved from last PT session  Ambulation/Gait Ambulation/Gait assistance: Min guard Ambulation Distance (Feet): 18 Feet Assistive device: Rolling walker (2 wheeled) Gait Pattern/deviations: Step-through pattern     General Gait Details: cues for breathing and posture, SpO2 90-92% on 4L   Stairs            Wheelchair Mobility    Modified Rankin (Stroke Patients Only)       Balance                                            Cognition Arousal/Alertness: Awake/alert Behavior During Therapy: WFL for tasks assessed/performed Overall Cognitive Status: Within Functional Limits for tasks assessed                                        Exercises      General Comments General comments (skin integrity, edema, etc.): encouarged pt to stay OOB in recliner until supper; reviewed LAQs, heel raises, toe raises       Pertinent Vitals/Pain Pain Assessment: No/denies pain    Home Living                      Prior Function            PT Goals (current goals can now be found in the care plan section) Acute Rehab PT Goals Patient Stated Goal: to be able to do for myself PT Goal Formulation: With patient Time For Goal Achievement: 10/01/17 Potential to Achieve Goals: Good Progress towards PT goals: Progressing toward goals    Frequency    Min 3X/week      PT Plan Current plan remains appropriate    Co-evaluation              AM-PAC PT "6 Clicks" Daily Activity  Outcome Measure  Difficulty turning over in bed (including adjusting bedclothes, sheets and blankets)?: None Difficulty moving from lying on back to sitting on the side of the bed? : None Difficulty sitting down on and standing up from a chair with arms (e.g., wheelchair, bedside commode, etc,.)?: A Little Help needed moving to and from a bed to chair (including a wheelchair)?: A Little  Help needed walking in hospital room?: A Little Help needed climbing 3-5 steps with a railing? : A Little 6 Click Score: 20    End of Session Equipment Utilized During Treatment: Oxygen Activity Tolerance: Patient tolerated treatment well Patient left: in chair;with call bell/phone within reach;with family/visitor present   PT Visit Diagnosis: Unsteadiness on feet (R26.81)     Time: 4944-9675 PT Time Calculation (min) (ACUTE ONLY): 10 min  Charges:  $Gait Training: 8-22 mins                    G CodesKenyon Ana, PT Pager: [phone] 09/19/2017    Kenyon Ana 09/19/2017, 4:23 PM

## 2017-09-19 NOTE — Evaluation (Signed)
Occupational Therapy Evaluation Patient Details Name: Marissa Wilkerson MRN: [id number] DOB: February 03, 1961 Today's Date: 09/19/2017    History of Present Illness Marissa Wilkerson is a 57 y.o. female with medical history significant of sarcoidosis and hypertension with no prior diagnosis of congestive heart failure that came from home with significant shortness of breath generalized swelling increasing lower extremity edema    Clinical Impression   Pt was admitted for the above.  She has been having assistance for adls from husband and he has been assisting her while she has been in the hospital. She had most AE at home:  Educated on leg lifter and energy conservation today.  No further OT is needed at this time    Follow Up Recommendations  Supervision - Intermittent    Equipment Recommendations  None recommended by OT    Recommendations for Other Services       Precautions / Restrictions Precautions Precaution Comments: DOE, on 4 L. Restrictions Weight Bearing Restrictions: No      Mobility Bed Mobility            not performed      Transfers            not performed          Balance                                           ADL either performed or assessed with clinical judgement   ADL Overall ADL's : Needs assistance/impaired                                       General ADL Comments: pt has assistance with adls from husband.  Her endurance is decreased and he has been helping her this hospital stay.  Educated on energy conservation and gave them handouts. Educated and showed pt leg lifter to assist with bed mobility for getting legs over tub ledge.  Pt has a toilet aide but it is hard for her to use right now with extra fluid.      Vision         Perception     Praxis      Pertinent Vitals/Pain Pain Assessment: No/denies pain     Hand Dominance     Extremity/Trunk Assessment Upper Extremity Assessment Upper  Extremity Assessment: WFLs           Communication Communication Communication: No difficulties   Cognition Arousal/Alertness: Awake/alert Behavior During Therapy: WFL for tasks assessed/performed Overall Cognitive Status: Within Functional Limits for tasks assessed                                     General Comments       Exercises     Shoulder Instructions      Home Living Family/patient expects to be discharged to:: Private residence Living Arrangements: Spouse/significant other Available Help at Discharge: Family               Bathroom Shower/Tub: Teacher, early years/pre: Standard     Home Equipment: Environmental consultant - 2 wheels;Bedside commode;Tub bench;Wheelchair - manual   Additional Comments: reacher      Prior Functioning/Environment Level of Independence: Needs  assistance    ADL's / Homemaking Assistance Needed: husband helps with bathing.  Pt cannot lift legs over ledge            OT Problem List:        OT Treatment/Interventions:      OT Goals(Current goals can be found in the care plan section) Acute Rehab OT Goals Patient Stated Goal: to be able to do for myself OT Goal Formulation: All assessment and education complete, DC therapy  OT Frequency:     Barriers to D/C:            Co-evaluation              AM-PAC PT "6 Clicks" Daily Activity     Outcome Measure Help from another person eating meals?: None Help from another person taking care of personal grooming?: A Little Help from another person toileting, which includes using toliet, bedpan, or urinal?: A Lot Help from another person bathing (including washing, rinsing, drying)?: A Lot Help from another person to put on and taking off regular upper body clothing?: A Little Help from another person to put on and taking off regular lower body clothing?: A Lot 6 Click Score: 16   End of Session    Activity Tolerance:   Patient left: in bed;with call  bell/phone within reach  OT Visit Diagnosis: Muscle weakness (generalized) (M62.81)                Time: 3403-5248 OT Time Calculation (min): 17 min Charges:  OT General Charges $OT Visit: 1 Visit OT Evaluation $OT Eval Low Complexity: 1 Low G-Codes:     [place], OTR/L [phone] 09/19/2017  Matsuko Kretz 09/19/2017, 12:01 PM

## 2017-09-19 NOTE — Progress Notes (Signed)
PROGRESS NOTE  Marissa Wilkerson WNI:[id number] DOB: 11-26-1960 DOA: 09/15/2017 PCP: Jani Gravel, MD  HPI/Recap of past 24 hours:  Marissa Wilkerson is a 57 y.o. year old female with medical history significant for pulmonary sarcoidosis, diabetes,  and hypertension  who presented on 09/15/2017 with progressively worsening SOB and generalized swelling and was found to have profound anasarca.    This morning, reports worsening swelling of lower legs. Increased weight. No change in breathing.   Assessment/Plan: Principal Problem:   CHF (congestive heart failure), NYHA class I, unspecified failure chronicity, diastolic (HCC) Active Problems:   Chronic respiratory failure (HCC)   Hypertension   Diabetes mellitus without complication (HCC)   Diastolic dysfunction   Sarcoidosis of lung (HCC)   Hyperlipidemia   Bilateral carotid artery disease (HCC)   Hyperkalemia   CHF (congestive heart failure) (HCC)    Anasarca, presumed secondary to nephrotic syndrome, worsening.  No prior history of CHF ( however chronically on lasix as outpatient).  TTE on admission shows no diastolic dysfunction and preserved EF.  Significant amount of protein wasting on UA and confirmed on urine protein creatinine analysis.  Patient with history of sarcoidosis but no other known autoimmune disease query possible renal involvement. CT abdomen with no cirrhotic liver pathology. Transition from IV Lasix to oral today.  Increased oral regimen to 160 mg twice daily, will continue to monitor with daily weights and I/os.  Anticipate may need metolazone for further prompting.  If no significant improvement in edema will discuss with renal as may need evaluation sooner than outpatient evaluation.  Chronic respriatory failure presumed secondary to sarcoidosis.  Stable on home oxygen regimen.  4 L nasal cannula.  Some concern for potential Involvement given imaging showing new nodules in the right lung base.  Will need follow-up as  outpatient.  Followed by Dr. Lamonte Sakai for pulmonary manifestations, methotrexate was discontinued on 05/2017.  AKI, prerenal, resolved.  Likely Secondary to hypervolemia in setting of anasarca. Type 2 diabetes, controlled on Januvia and Toujeo at home.  We will continue Lantus 20 units twice daily with close blood glucose monitoring.  Hypertension, slightly elevated on admission, currently improved.  On lisinopril at home which has been continued here.  Carvedilol was added further blood pressure control, will continue monitor.  Hyperkalemia, resolved.  Likely in the setting of AKI which is also now resolved.  We will continue to monitor electrolytes while on lisinopril.  Left breast thickening, in the setting of generalized anasarca.  Most likely related however will need follow-up as an outpatient to confirm.   Code Status: Full Code  Family Communication: Husband at bedside  Disposition Plan: improvement in swelling on oral lasix regimen   Consultants:  None  Procedures:  TTE: 09/16/17: EF 60%, moderate LVH, normal diastolic function, no regional wall abnormalities, mild aortic stenosis, LA moderately dilated, PA peak pressure 38 mmHg  Antimicrobials:  none  Cultures:  none  DVT prophylaxis: heparin   Objective: Vitals:   09/18/17 2151 09/19/17 0452 09/19/17 0522 09/19/17 0803  BP: (!) 180/71 (!) 139/43    Pulse: 70 69  68  Resp: 18 18  18   Temp: 99 F (37.2 C) (!) 97.4 F (36.3 C)    TempSrc: Oral Oral    SpO2: 98% 100%  96%  Weight:   (!) 139.1 kg (306 lb 10.6 oz)   Height:        Intake/Output Summary (Last 24 hours) at 09/19/2017 1644 Last data filed at 09/19/2017 1500  Gross per 24 hour  Intake 160 ml  Output 1575 ml  Net -1415 ml   Filed Weights   09/17/17 0628 09/18/17 0511 09/19/17 0522  Weight: (!) 138.5 kg (305 lb 5.4 oz) (!) 137.9 kg (304 lb 0.2 oz) (!) 139.1 kg (306 lb 10.6 oz)    Exam:  General: Sitting in bed, in no apparent distres Eyes:  EOMI, anicteric Neck: normal, no appreciable JVD Cardiovascular: regular rate and rhythm, no murmurs, rubs or gallops 3+ pitting edema from ankles to thighs bilaterally, sacrum involved, additionally both arms Respiratory: Normal respiratory effort, lungs clear to auscultation bilaterally,on 4L [place] ( home regimen) Abdomen: soft, Distended, non-tender, normal bowel sounds, no guarding or rebound tenderness Skin: No Rash Musculoskeletal:No clubbing / cyanosis. No joint deformity upper and lower extremities. Good ROM, no contractures. Normal muscle tone Neurologic: Grossly no focal neuro deficit.Mental status AAOx3, speech normal, Psychiatric:Appropriate affect, and mood  Data Reviewed: CBC: Recent Labs  Lab 09/15/17 1844  09/16/17 0514 09/16/17 1122 09/17/17 0523 09/18/17 0613 09/19/17 0505  WBC 5.5  --  5.6  --  6.2 5.7 6.1  NEUTROABS 3.7  --  3.6  --   --   --   --   HGB 8.3*   < > 7.6* 8.0* 8.3* 8.1* 7.8*  HCT 27.1*   < > 25.3* 25.3* 26.8* 26.3* 25.2*  MCV 98.9  --  99.6  --  98.9 98.5 97.7  PLT 198  --  193  --  213 190 187   < > = values in this interval not displayed.   Basic Metabolic Panel: Recent Labs  Lab 09/15/17 2333 09/16/17 0514 09/16/17 1530 09/17/17 0523 09/18/17 0613 09/19/17 0505  NA  --  137 137 137 137 138  K  --  5.7* 5.8* 5.1 4.7 4.7  CL  --  109 107 107 105 106  CO2  --  24 27 26 29 30   GLUCOSE  --  100* 150* 107* 107* 131*  BUN  --  34* 33* 26* 23* 23*  CREATININE  --  1.20* 1.14* 0.91 0.95 0.97  CALCIUM  --  8.1* 8.2* 8.2* 7.8* 7.8*  MG 1.9  --   --   --  1.9  --    GFR: Estimated Creatinine Clearance: 90.1 mL/min (by C-G formula based on SCr of 0.97 mg/dL). Liver Function Tests: Recent Labs  Lab 09/15/17 1844 09/16/17 0514 09/17/17 0523  AST 25 17 19   ALT 15 12* 14  ALKPHOS 118 105 113  BILITOT 0.5 0.2* 0.2*  PROT 7.1 6.4* 7.0  ALBUMIN 2.7* 2.4* 2.8*   No results for input(s): LIPASE, AMYLASE in the last 168 hours. No results for  input(s): AMMONIA in the last 168 hours. Coagulation Profile: No results for input(s): INR, PROTIME in the last 168 hours. Cardiac Enzymes: Recent Labs  Lab 09/15/17 2333 09/16/17 0514 09/16/17 1122  TROPONINI <0.03 <0.03 <0.03   BNP (last 3 results) No results for input(s): PROBNP in the last 8760 hours. HbA1C: No results for input(s): HGBA1C in the last 72 hours. CBG: Recent Labs  Lab 09/18/17 1151 09/18/17 1640 09/18/17 2149 09/19/17 0748 09/19/17 1307  GLUCAP 119* 122* 109* 121* 146*   Lipid Profile: No results for input(s): CHOL, HDL, LDLCALC, TRIG, CHOLHDL, LDLDIRECT in the last 72 hours. Thyroid Function Tests: No results for input(s): TSH, T4TOTAL, FREET4, T3FREE, THYROIDAB in the last 72 hours. Anemia Panel: No results for input(s): VITAMINB12, FOLATE, FERRITIN, TIBC, IRON, RETICCTPCT in  the last 72 hours. Urine analysis:    Component Value Date/Time   COLORURINE YELLOW 09/17/2017 1437   APPEARANCEUR HAZY (A) 09/17/2017 1437   LABSPEC 1.015 09/17/2017 1437   PHURINE 6.0 09/17/2017 1437   GLUCOSEU 50 (A) 09/17/2017 1437   HGBUR SMALL (A) 09/17/2017 1437   BILIRUBINUR NEGATIVE 09/17/2017 1437   KETONESUR 5 (A) 09/17/2017 1437   PROTEINUR >=300 (A) 09/17/2017 1437   UROBILINOGEN 1.0 03/03/2014 0942   NITRITE POSITIVE (A) 09/17/2017 1437   LEUKOCYTESUR TRACE (A) 09/17/2017 1437   Sepsis Labs: @LABRCNTIP (procalcitonin:4,lacticidven:4)  )No results found for this or any previous visit (from the past 240 hour(s)).    Studies: No results found.  Scheduled Meds: . aspirin EC  81 mg Oral Daily  . atorvastatin  20 mg Oral Daily  . carvedilol  6.25 mg Oral BID WC  . fluticasone  1 spray Each Nare Daily  . folic acid  1 mg Oral Daily  . furosemide  120 mg Oral BID  . gabapentin  300 mg Oral TID  . heparin  5,000 Units Subcutaneous Q8H  . insulin glargine  20 Units Subcutaneous BID  . lisinopril  10 mg Oral Daily  . mouth rinse  15 mL Mouth Rinse BID  .  phenytoin  200 mg Oral BID  . sodium chloride flush  3 mL Intravenous Q12H    Continuous Infusions: . sodium chloride       LOS: 4 days     Desiree Hane, MD [facility] Pager [phone]  If 7PM-7AM, please contact night-coverage [email or web address] Password [facility] 09/19/2017, 4:44 PM

## 2017-09-20 ENCOUNTER — Inpatient Hospital Stay (HOSPITAL_COMMUNITY): Payer: 59

## 2017-09-20 ENCOUNTER — Encounter (HOSPITAL_COMMUNITY): Payer: Self-pay

## 2017-09-20 DIAGNOSIS — M7989 Other specified soft tissue disorders: Secondary | ICD-10-CM

## 2017-09-20 LAB — BASIC METABOLIC PANEL
Anion gap: 2 — ABNORMAL LOW (ref 5–15)
BUN: 23 mg/dL — ABNORMAL HIGH (ref 6–20)
CALCIUM: 7.8 mg/dL — AB (ref 8.9–10.3)
CO2: 30 mmol/L (ref 22–32)
CREATININE: 0.99 mg/dL (ref 0.44–1.00)
Chloride: 107 mmol/L (ref 101–111)
GFR calc non Af Amer: >60 mL/min (ref >60)
Glucose, Bld: 113 mg/dL — ABNORMAL HIGH (ref 65–99)
Potassium: 4.9 mmol/L (ref 3.5–5.1)
SODIUM: 139 mmol/L (ref 135–145)

## 2017-09-20 LAB — GLUCOSE, CAPILLARY
GLUCOSE-CAPILLARY: 157 mg/dL — AB (ref 65–99)
GLUCOSE-CAPILLARY: 156 mg/dL — AB (ref 65–99)
Glucose-Capillary: 105 mg/dL — ABNORMAL HIGH (ref 65–99)
Glucose-Capillary: 137 mg/dL — ABNORMAL HIGH (ref 65–99)
Glucose-Capillary: 191 mg/dL — ABNORMAL HIGH (ref 65–99)

## 2017-09-20 MED ORDER — FUROSEMIDE 10 MG/ML IJ SOLN
80.0000 mg | Freq: Two times a day (BID) | INTRAMUSCULAR | Status: DC
Start: 2017-09-20 — End: 2017-09-20
  Administered 2017-09-20: 80 mg via INTRAVENOUS
  Filled 2017-09-20: qty 8

## 2017-09-20 MED ORDER — FUROSEMIDE 10 MG/ML IJ SOLN
120.0000 mg | Freq: Three times a day (TID) | INTRAVENOUS | Status: DC
  Administered 2017-09-20 – 2017-09-21 (×2): 120 mg via INTRAVENOUS
  Filled 2017-09-20 (×4): qty 12

## 2017-09-20 MED FILL — HUMALOG 100 UNITS/ML KWIKPE: 100 | 30 days supply | Qty: 15 | Fill #0

## 2017-09-20 NOTE — Progress Notes (Signed)
Dr. Lonny Prude made aware of Pt's left lower extremity report.

## 2017-09-20 NOTE — Progress Notes (Signed)
PROGRESS NOTE  Marissa Wilkerson MOQ:[id number] DOB: 1961/05/01 DOA: 09/15/2017 PCP: Jani Gravel, MD  HPI/Recap of past 24 hours:  Marissa Wilkerson is a 57 y.o. year old female with medical history significant for pulmonary sarcoidosis, diabetes,  and hypertension  who presented on 09/15/2017 with progressively worsening SOB and generalized swelling and was found to have profound anasarca.    This morning, continues to report worsening swelling of lower legs. Increased weight again overnight. No change in breathing.   Assessment/Plan: Principal Problem:   CHF (congestive heart failure), NYHA class I, unspecified failure chronicity, diastolic (HCC) Active Problems:   Chronic respiratory failure (HCC)   Hypertension   Diabetes mellitus without complication (HCC)   Diastolic dysfunction   Sarcoidosis of lung (HCC)   Hyperlipidemia   Bilateral carotid artery disease (HCC)   Hyperkalemia   CHF (congestive heart failure) (HCC)    Anasarca, , worsening.  No prior history of CHF ( however chronically on lasix as outpatient).  TTE on admission shows no diastolic dysfunction and preserved EF.  Significant amount of protein wasting on UA and confirmed on urine protein creatinine analysis.  Patient with history of sarcoidosis but no other known autoimmune disease query possible renal involvement. CT abdomen with no cirrhotic liver pathology. Restarted IV lasix ( 120 mg BID), monitor output and weight. May add metolazone if no adequate diuresis. Appreciate renal recommendations: renal ultrasound, urine studies. D/c'd lisinopril. Renal suspects anasarca more indicative of CHF given pulmonary involvement ( which is atypical of nephrotic syndrome) and degree of proteinuri can be potentially explained by poorly controlled diabetes  Left leg swelling > right. 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 Discussed with vascular this is not considered a DVT unless causing significant pain. Will hold off on  anticoagulation for now unless clinically worsens.   Chronic respriatory failure presumed secondary to sarcoidosis.  Stable on home oxygen regimen.  4 L nasal cannula.  Some concern for potential Involvement given imaging showing new nodules in the right lung base.  Will need follow-up as outpatient.  Followed by Dr. Lamonte Sakai for pulmonary manifestations, methotrexate was discontinued on 05/2017.  AKI, prerenal, resolved.  Likely Secondary to hypervolemia in setting of anasarca. Type 2 diabetes, controlled on Januvia and Toujeo at home.  We will continue Lantus 20 units twice daily with close blood glucose monitoring.  Hypertension, slightly elevated on admission, currently improved.  Carvedilol was added further blood pressure control, will continue monitor. D/c lisinopril  T2DM, history of poor controlled ( a1c of 9 in the past. Possible this dregree of proteinuria secondary to diabetes. Will follow renal recs. Continue lantus 20 U plus corrective insulin(short acting)  Hyperkalemia, resolved.  Likely in the setting of AKI which is also now resolved.  We will continue to monitor electrolytes while on lisinopril.  Left breast thickening, in the setting of generalized anasarca.  Most likely related however will need follow-up as an outpatient to confirm.   Code Status: Full Code  Family Communication: Husband at bedside  Disposition Plan: improvement in swelling on oral lasix regimen   Consultants:  None  Procedures:  TTE: 09/16/17: EF 60%, moderate LVH, normal diastolic function, no regional wall abnormalities, mild aortic stenosis, LA moderately dilated, PA peak pressure 38 mmHg  Antimicrobials:  none  Cultures:  none  DVT prophylaxis: heparin   Objective: Vitals:   09/19/17 1700 09/19/17 2015 09/20/17 0556 09/20/17 1243  BP: (!) 150/68 (!) 150/70 (!) 128/50 (!) 148/55  Pulse: 77  68 76 64  Resp: 18 20 18 15   Temp: 98.5 F (36.9 C) 98.4 F (36.9 C) 98.6 F (37 C)  99.1 F (37.3 C)  TempSrc: Oral Oral Oral Oral  SpO2: 98% 99% 100% 100%  Weight:   (!) 140 kg (308 lb 11.2 oz)   Height:        Intake/Output Summary (Last 24 hours) at 09/20/2017 1525 Last data filed at 09/20/2017 5277 Gross per 24 hour  Intake 120 ml  Output 500 ml  Net -380 ml   Filed Weights   09/18/17 0511 09/19/17 0522 09/20/17 0556  Weight: (!) 137.9 kg (304 lb 0.2 oz) (!) 139.1 kg (306 lb 10.6 oz) (!) 140 kg (308 lb 11.2 oz)    Exam:  General: Sitting in bed, in no apparent distres Neck: normal, no appreciable JVD Cardiovascular: regular rate and rhythm, no murmurs, rubs or gallops 3+ pitting edema from ankles to thighs bilaterally, sacrum involved, additionally both arms Respiratory: Normal respiratory effort, lungs clear to auscultation bilaterally,on 4L [place] ( home regimen) Abdomen: soft, Distended, non-tender, normal bowel sounds, no guarding or rebound tenderness Skin: No Rash Neurologic: Grossly no focal neuro deficit.Mental status AAOx3, speech normal, Psychiatric:Appropriate affect, and mood  Data Reviewed: CBC: Recent Labs  Lab 09/15/17 1844  09/16/17 0514 09/16/17 1122 09/17/17 0523 09/18/17 0613 09/19/17 0505  WBC 5.5  --  5.6  --  6.2 5.7 6.1  NEUTROABS 3.7  --  3.6  --   --   --   --   HGB 8.3*   < > 7.6* 8.0* 8.3* 8.1* 7.8*  HCT 27.1*   < > 25.3* 25.3* 26.8* 26.3* 25.2*  MCV 98.9  --  99.6  --  98.9 98.5 97.7  PLT 198  --  193  --  213 190 187   < > = values in this interval not displayed.   Basic Metabolic Panel: Recent Labs  Lab 09/15/17 2333  09/16/17 1530 09/17/17 0523 09/18/17 0613 09/19/17 0505 09/20/17 0500  NA  --    < > 137 137 137 138 139  K  --    < > 5.8* 5.1 4.7 4.7 4.9  CL  --    < > 107 107 105 106 107  CO2  --    < > 27 26 29 30 30   GLUCOSE  --    < > 150* 107* 107* 131* 113*  BUN  --    < > 33* 26* 23* 23* 23*  CREATININE  --    < > 1.14* 0.91 0.95 0.97 0.99  CALCIUM  --    < > 8.2* 8.2* 7.8* 7.8* 7.8*  MG 1.9  --    --   --  1.9  --   --    < > = values in this interval not displayed.   GFR: Estimated Creatinine Clearance: 88.6 mL/min (by C-G formula based on SCr of 0.99 mg/dL). Liver Function Tests: Recent Labs  Lab 09/15/17 1844 09/16/17 0514 09/17/17 0523  AST 25 17 19   ALT 15 12* 14  ALKPHOS 118 105 113  BILITOT 0.5 0.2* 0.2*  PROT 7.1 6.4* 7.0  ALBUMIN 2.7* 2.4* 2.8*   No results for input(s): LIPASE, AMYLASE in the last 168 hours. No results for input(s): AMMONIA in the last 168 hours. Coagulation Profile: No results for input(s): INR, PROTIME in the last 168 hours. Cardiac Enzymes: Recent Labs  Lab 09/15/17 2333 09/16/17 0514 09/16/17 1122  TROPONINI <0.03 <  0.03 <0.03   BNP (last 3 results) No results for input(s): PROBNP in the last 8760 hours. HbA1C: No results for input(s): HGBA1C in the last 72 hours. CBG: Recent Labs  Lab 09/19/17 1307 09/19/17 1746 09/19/17 2027 09/20/17 0752 09/20/17 1144  GLUCAP 146* 157* 153* 105* 137*   Lipid Profile: No results for input(s): CHOL, HDL, LDLCALC, TRIG, CHOLHDL, LDLDIRECT in the last 72 hours. Thyroid Function Tests: No results for input(s): TSH, T4TOTAL, FREET4, T3FREE, THYROIDAB in the last 72 hours. Anemia Panel: No results for input(s): VITAMINB12, FOLATE, FERRITIN, TIBC, IRON, RETICCTPCT in the last 72 hours. Urine analysis:    Component Value Date/Time   COLORURINE YELLOW 09/17/2017 1437   APPEARANCEUR HAZY (A) 09/17/2017 1437   LABSPEC 1.015 09/17/2017 1437   PHURINE 6.0 09/17/2017 1437   GLUCOSEU 50 (A) 09/17/2017 1437   HGBUR SMALL (A) 09/17/2017 1437   BILIRUBINUR NEGATIVE 09/17/2017 1437   KETONESUR 5 (A) 09/17/2017 1437   PROTEINUR >=300 (A) 09/17/2017 1437   UROBILINOGEN 1.0 03/03/2014 0942   NITRITE POSITIVE (A) 09/17/2017 1437   LEUKOCYTESUR TRACE (A) 09/17/2017 1437   Sepsis Labs: @LABRCNTIP (procalcitonin:4,lacticidven:4)  )No results found for this or any previous visit (from the past 240  hour(s)).    Studies: No results found.  Scheduled Meds: . atorvastatin  20 mg Oral Daily  . carvedilol  6.25 mg Oral BID WC  . fluticasone  1 spray Each Nare Daily  . folic acid  1 mg Oral Daily  . furosemide  80 mg Intravenous BID  . furosemide  120 mg Oral BID  . gabapentin  300 mg Oral TID  . heparin  5,000 Units Subcutaneous Q8H  . insulin glargine  20 Units Subcutaneous BID  . lisinopril  10 mg Oral Daily  . mouth rinse  15 mL Mouth Rinse BID  . phenytoin  200 mg Oral BID  . sodium chloride flush  3 mL Intravenous Q12H    Continuous Infusions: . sodium chloride       LOS: 5 days     Desiree Hane, MD [facility] Pager [phone]  If 7PM-7AM, please contact night-coverage [email or web address] Password [facility] 09/20/2017, 3:25 PM

## 2017-09-20 NOTE — Progress Notes (Signed)
Left lower extremity venous duplex has been completed. There is evidence of acute intramuscular deep vein thrombosis involving the gastrocnemius veins of the left lower extremity. Results were given to the patient's nurse, Anderson Malta.  09/20/17 2:26 PM Marissa Wilkerson RVT

## 2017-09-20 NOTE — Consult Note (Addendum)
Renal Service Consult Note [facility]  Marissa Wilkerson 09/20/2017 Sol Blazing Requesting Physician: Dr Joellyn Quails, Chauncey Cruel  Reason for Consult:  Proteinuria HPI: The patient is a 57 y.o. year-old with hx of sarcoidosis, poorly cont DM2 on insulin, HL, DJD and anemia admitted on 1/19 with SOB and acute on chronic worsening of LE edema.  W/u showed proteinuria 7.9 gm on urine PCR, alb 2.8, CT chest showed ground glass changes and CXR showed vasc congestion and small effusions. Asked to see for proteinuria and edema.   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  Had "horrible" DM which was poorly controlled she says.  Had sarcoidosis dx'd around 2015.  Last UA in 2015 showed 100 protein.  +DOE and orthopnea.  Swelling in lower legs for years, but upper leg and abd wall swelling is new x 2-3 weeks.      Home meds: -lantus/ januvia/ novolog -dilantin -lisinopril 10 qd/ lasix 80 qd -methotrexate 15mg  weekly -prn zanaflex/ albuterol/ phenergan/ norco/ bendaryl -lipitor/ neurontin   ROS  denies CP  no joint pain   no HA  no blurry vision  no rash  no diarrhea  no nausea/ vomiting  no dysuria  no difficulty voiding  no change in urine color    Past Medical History  Past Medical History:  Diagnosis Date  . Anemia    05/04/15 hemglobin 8.8  . Arthritis   . Diabetes mellitus without complication (HCC)    insulin dependent, po meds  . Diastolic dysfunction    followed by dr polite found on echo 06-12-13 , grade 1 per pt  . Dyslipidemia   . Edema of extremities    PITTING EDEMA     . GERD (gastroesophageal reflux disease)   . Hypertension   . Sarcoidosis of lung [facility])    pulmonologist Dr.Byrum, [id number]   . Shortness of breath    ON EXERTION , O2 2-3 l/min  . 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 Vertigo, benign positional   . Wheezing    OCC   Past Surgical History  Past Surgical History:  Procedure Laterality Date  . all teeth  extraction  7-8 yrs ago  . CHOLECYSTECTOMY  1996  . COLONOSCOPY WITH PROPOFOL N/A 08/05/2013   Procedure: COLONOSCOPY WITH PROPOFOL;  Surgeon: Garlan Fair, MD;  Location: [facility];  Service: Endoscopy;  Laterality: N/A;  . ESOPHAGOGASTRODUODENOSCOPY (EGD) WITH PROPOFOL N/A 05/31/2015   Procedure: ESOPHAGOGASTRODUODENOSCOPY (EGD) WITH PROPOFOL;  Surgeon: Garlan Fair, MD;  Location: [facility];  Service: Endoscopy;  Laterality: N/A;  . LUNG BIOPSY Left 03/05/2014   Procedure: LUNG BIOPSY;  Surgeon: Gaye Pollack, MD;  Location: [place];  Service: Thoracic;  Laterality: Left;  [name] VIDEO ASSISTED THORACOSCOPY Left 03/05/2014   Procedure: VIDEO ASSISTED THORACOSCOPY;  Surgeon: Gaye Pollack, MD;  Location: [place];  Service: Thoracic;  Laterality: Left;  [name] VIDEO BRONCHOSCOPY Bilateral 09/04/2013   Procedure: VIDEO BRONCHOSCOPY WITH FLUORO;  Surgeon: Collene Gobble, MD;  Location: [facility];  Service: Cardiopulmonary;  Laterality: Bilateral;  . WISDOM TOOTH EXTRACTION  yrs ago   Family History  Family History  Problem Relation Age of Onset  . Heart failure Mother   . Diabetes Father   . Hypertension Father   . Diabetes Brother   . Hypertension Brother    Social History  reports that she quit smoking about 5 years ago. Her smoking use included  cigarettes. She has a 12.50 pack-year smoking history. she has never used smokeless tobacco. She reports that she drinks alcohol. She reports that she does not use drugs. Allergies  Allergies  Allergen Reactions  . Cymbalta [Duloxetine Hcl] Other (See Comments)    Hallucinations   . Fentanyl Other (See Comments)    Patches. Blistered skin.   . Metformin And Related Nausea And Vomiting  . Mobic [Meloxicam] Nausea And Vomiting  . Sulfa Antibiotics Rash  . Voltaren [Diclofenac] Nausea And Vomiting   Home medications Prior to Admission medications   Medication Sig Start Date End Date Taking? Authorizing Provider  atorvastatin (LIPITOR) 20 MG  tablet Take 20 mg by mouth daily.   Yes [provider]  diphenhydrAMINE (BENADRYL) 25 mg capsule Take 25-50 mg by mouth at bedtime as needed for sleep.    Yes [provider]  furosemide (LASIX) 40 MG tablet Take 80 mg by mouth daily at 3 pm.  09/05/13  Yes Polite, Jori Moll, MD  gabapentin (NEURONTIN) 300 MG capsule Take 600 mg by mouth 3 (three) times daily. Take 300mg  in AM and 600mg  in PM   Yes [provider]  HYDROcodone-acetaminophen (NORCO) 7.5-325 MG tablet Take 1 tablet by mouth every 6 (six) hours as needed for moderate pain.   Yes [provider]  insulin aspart (NOVOLOG) 100 UNIT/ML injection Inject 15-25 Units into the skin 2 (two) times daily before a meal. Sliding scale   Yes [provider]  Insulin Glargine (TOUJEO SOLOSTAR) 300 UNIT/ML SOPN Inject 25 Units into the skin 2 (two) times daily.   Yes [provider]  L-Methylfolate-Algae-B12-B6 Glade Stanford) 3-90.314-2-35 MG CAPS Take 1 capsule by mouth 2 (two) times daily.   Yes [provider]  lisinopril (PRINIVIL,ZESTRIL) 10 MG tablet Take 10 mg by mouth daily.   Yes [provider]  phenytoin (DILANTIN) 200 MG ER capsule Take 200 mg by mouth 2 (two) times daily. 07/28/16 09/15/17 Yes [provider]  potassium chloride SA (K-DUR,KLOR-CON) 20 MEQ tablet Take 20 mEq by mouth daily.   Yes [provider]  promethazine (PHENERGAN) 25 MG tablet Take 25 mg by mouth every 4 (four) hours as needed for nausea or vomiting.   Yes [provider]  sitaGLIPtin (JANUVIA) 100 MG tablet Take 100 mg by mouth daily.    Yes [provider]  tiZANidine (ZANAFLEX) 4 MG tablet Take 4 mg by mouth every 8 (eight) hours as needed for muscle spasms.  10/01/14  Yes [provider]  albuterol (PROVENTIL HFA;VENTOLIN HFA) 108 (90 BASE) MCG/ACT inhaler Inhale 2 puffs into the lungs every 6 (six) hours as needed for wheezing or shortness of breath. Patient not  taking: Reported on 09/15/2017 10/30/13   Collene Gobble, MD  folic acid (FOLVITE) 1 MG tablet Take 1 tablet (1 mg total) by mouth daily. Patient not taking: Reported on 09/15/2017 12/08/15   Collene Gobble, MD  methotrexate (RHEUMATREX) 2.5 MG tablet TAKE 6 TABLETS BY MOUTH ONCE A WEEK ON MONDAYS. Patient not taking: Reported on 09/15/2017 05/21/17   Collene Gobble, MD   Liver Function Tests Recent Labs  Lab 09/15/17 1844 09/16/17 0514 09/17/17 0523  AST 25 17 19   ALT 15 12* 14  ALKPHOS 118 105 113  BILITOT 0.5 0.2* 0.2*  PROT 7.1 6.4* 7.0  ALBUMIN 2.7* 2.4* 2.8*   No results for input(s): LIPASE, AMYLASE in the last 168 hours. CBC Recent Labs  Lab 09/15/17 1844  09/16/17 2683  09/17/17 0523 09/18/17 0613 09/19/17 0505  WBC 5.5  --  5.6  --  6.2 5.7 6.1  NEUTROABS 3.7  --  3.6  --   --   --   --   HGB 8.3*   < > 7.6*   < > 8.3* 8.1* 7.8*  HCT 27.1*   < > 25.3*   < > 26.8* 26.3* 25.2*  MCV 98.9  --  99.6  --  98.9 98.5 97.7  PLT 198  --  193  --  213 190 187   < > = values in this interval not displayed.   Basic Metabolic Panel Recent Labs  Lab 09/15/17 1844 09/15/17 1905 09/16/17 0514 09/16/17 1530 09/17/17 0523 09/18/17 0613 09/19/17 0505 09/20/17 0500  NA 139 139 137 137 137 137 138 139  K 6.7* 6.6* 5.7* 5.8* 5.1 4.7 4.7 4.9  CL 110 109 109 107 107 105 106 107  CO2 26  --  24 27 26 29 30 30   GLUCOSE 138* 136* 100* 150* 107* 107* 131* 113*  BUN 39* 45* 34* 33* 26* 23* 23* 23*  CREATININE 1.29* 1.30* 1.20* 1.14* 0.91 0.95 0.97 0.99  CALCIUM 8.5*  --  8.1* 8.2* 8.2* 7.8* 7.8* 7.8*   Iron/TIBC/Ferritin/ %Sat    Component Value Date/Time   IRON 46 09/16/2017 1530   TIBC 253 09/16/2017 1530   FERRITIN 93 09/16/2017 1530   IRONPCTSAT 18 09/16/2017 1530    Vitals:   09/19/17 1700 09/19/17 2015 09/20/17 0556 09/20/17 1243  BP: (!) 150/68 (!) 150/70 (!) 128/50 (!) 148/55  Pulse: 77 68 76 64  Resp: 18 20 18 15   Temp: 98.5 F (36.9 C) 98.4 F (36.9 C) 98.6  F (37 C) 99.1 F (37.3 C)  TempSrc: Oral Oral Oral Oral  SpO2: 98% 99% 100% 100%  Weight:   (!) 140 kg (308 lb 11.2 oz)   Height:       Exam Gen obese, no distress, calm No rash, cyanosis or gangrene Sclera anicteric, throat clear  No jvd or bruits Chest mostly clear , occ basilar rales RRR no MRG Abd soft ntnd no mass or ascites +bs, + lower abd wall edema GU defer MS no joint effusions or deformity Ext diffuse 2-3+ bilat LE pitting edema, no wounds or ulcers Neuro is alert, Ox 3 , nf,  UA > 300 prot, 6-30 wbc, 0-5 rbc Urine PCR = 7.9 gm/ 24hr estimated  BUN 23  Cr 0.9  CO2 30  Impression: 1. Proteinuria - diabetes can cause this degree of proteinuria, but is usually less.  2. Vol overload - has orthopnea and suspect pulm edema by imaging findings. Would call this part of fluid overload more due to diast HF than neph syndrome which usually doesn't cause pulm edema in absence of ^^creatinine. Also dc acei, increase lasix IV, consider zaroxolyn, get urine lytes, renal US.  3. DM2 long standing, poor control 4. HTN on acei only 5. Obesity 6. Hx sarcoidosis  Plan - needs diuresis, stabilize vol status.  Not sure if will need renal biopsy or not. Will d/w colleagues.  Will follow.    Kelly Splinter MD [employer] pager [phone]   09/20/2017, 4:35 PM

## 2017-09-21 ENCOUNTER — Ambulatory Visit: Payer: BLUE CROSS/BLUE SHIELD | Admitting: Emergency Medicine

## 2017-09-21 LAB — BASIC METABOLIC PANEL
ANION GAP: 6 (ref 5–15)
Anion gap: 6 (ref 5–15)
BUN: 22 mg/dL — AB (ref 6–20)
BUN: 23 mg/dL — AB (ref 6–20)
CHLORIDE: 104 mmol/L (ref 101–111)
CHLORIDE: 102 mmol/L (ref 101–111)
CO2: 28 mmol/L (ref 22–32)
CO2: 30 mmol/L (ref 22–32)
Calcium: 7.8 mg/dL — ABNORMAL LOW (ref 8.9–10.3)
Calcium: 8.2 mg/dL — ABNORMAL LOW (ref 8.9–10.3)
Creatinine, Ser: 0.93 mg/dL (ref 0.44–1.00)
Creatinine, Ser: 0.82 mg/dL (ref 0.44–1.00)
GFR calc Af Amer: >60 mL/min (ref >60)
GFR calc Af Amer: >60 mL/min (ref >60)
GFR calc non Af Amer: >60 mL/min (ref >60)
GFR calc non Af Amer: >60 mL/min (ref >60)
GLUCOSE: 140 mg/dL — AB (ref 65–99)
GLUCOSE: 123 mg/dL — AB (ref 65–99)
POTASSIUM: 4.6 mmol/L (ref 3.5–5.1)
POTASSIUM: 4.5 mmol/L (ref 3.5–5.1)
Sodium: 138 mmol/L (ref 135–145)
Sodium: 138 mmol/L (ref 135–145)

## 2017-09-21 LAB — GLUCOSE, CAPILLARY
GLUCOSE-CAPILLARY: 184 mg/dL — AB (ref 65–99)
Glucose-Capillary: 92 mg/dL (ref 65–99)
Glucose-Capillary: 129 mg/dL — ABNORMAL HIGH (ref 65–99)
Glucose-Capillary: 118 mg/dL — ABNORMAL HIGH (ref 65–99)

## 2017-09-21 LAB — CREATININE, URINE, RANDOM: CREATININE, URINE: 42.11 mg/dL

## 2017-09-21 LAB — HEMOGLOBIN A1C
Hgb A1c MFr Bld: 5.4 % (ref 4.8–5.6)
Mean Plasma Glucose: 108.28 mg/dL

## 2017-09-21 LAB — SODIUM, URINE, RANDOM: SODIUM UR: 104 mmol/L

## 2017-09-21 MED ORDER — FUROSEMIDE 10 MG/ML IJ SOLN
160.0000 mg | Freq: Four times a day (QID) | INTRAVENOUS | Status: DC
  Administered 2017-09-21 – 2017-09-27 (×23): 160 mg via INTRAVENOUS
  Filled 2017-09-21 (×12): qty 16
  Filled 2017-09-21: qty 10
  Filled 2017-09-21 (×2): qty 16
  Filled 2017-09-21: qty 10
  Filled 2017-09-21 (×6): qty 16
  Filled 2017-09-21: qty 10
  Filled 2017-09-21 (×2): qty 16
  Filled 2017-09-21: qty 10

## 2017-09-21 MED ORDER — METOLAZONE 2.5 MG PO TABS
2.5000 mg | ORAL_TABLET | Freq: Two times a day (BID) | ORAL | Status: DC
  Administered 2017-09-21 – 2017-09-24 (×6): 2.5 mg via ORAL
  Filled 2017-09-21 (×7): qty 1

## 2017-09-21 MED FILL — UNIFINE PENTIPS 8MM 31G: 31G X 8 MM | 16 days supply | Qty: 100 | Fill #0

## 2017-09-21 NOTE — Care Management Note (Signed)
Case Management Note  Patient Details  Name: SHANYAH GATTUSO MRN: [id number] Date of Birth: 1961/06/12  Subjective/Objective: 57 y/o f admitted w/CHF. From home w/spouse. Has home 02,rw-[facility]. PT recc HHPT-patient chose [facility] rep Santiago Glad aware & following.                   Action/Plan:d/c home w/HHC.   Expected Discharge Date:                  Expected Discharge Plan:  [place]  In-House Referral:     Discharge planning Services  CM Consult  Post Acute Care Choice:  (rw,home 02-[facility]) Choice offered to:  Patient  DME Arranged:    DME Agency:     HH Arranged:  PT [place]:  [place]  Status of Service:  In process, will continue to follow  If discussed at Long Length of Stay Meetings, dates discussed:    Additional Comments:  Dessa Phi, RN 09/21/2017, 1:03 PM

## 2017-09-21 NOTE — Progress Notes (Signed)
PROGRESS NOTE  MARQUETTE PIONTEK IEP:[id number] DOB: 10/29/1960 DOA: 09/15/2017 PCP: Jani Gravel, MD  HPI/Recap of past 24 hours:  Marissa Wilkerson is a 57 y.o. year old female with medical history significant for pulmonary sarcoidosis, diabetes,  and hypertension  who presented on 09/15/2017 with progressively worsening SOB and generalized swelling and was found to have profound anasarca.    This morning, weight went down this morning.  However patient concerned as expected more urine output.  Denies any worsening cough, shortness of breath.  Assessment/Plan: Principal Problem:   CHF (congestive heart failure), NYHA class I, unspecified failure chronicity, diastolic (HCC) Active Problems:   Chronic respiratory failure (HCC)   Hypertension   Diabetes mellitus without complication (HCC)   Diastolic dysfunction   Sarcoidosis of lung (HCC)   Hyperlipidemia   Bilateral carotid artery disease (HCC)   Hyperkalemia   CHF (congestive heart failure) (HCC)    Anasarca, worsening possible CHF exacerbation (nephrotic syndrome less likely) Not diuresing well despite aggressive IV Lasix regimen.  Discussed with nephrology will add metolazone for further primary.  Output.  Patient's weight did go down 2 pounds,  however expect more.  Will monitor BMP twice daily none of aggressive diuresis for electrolyte abnormalities abnormalities.,  Proteinuria, suspect may be related to poorly controlled diabetes. Nephrology consulted, renal ultrasound within normal limits.  UA greater than 300 protein, urine protein:creatinine: 7.9 g/24 estimated. Normal creatinine  Left leg swelling > right. 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 Discussed with vascular this is not considered a DVT unless causing significant pain. Will hold off on anticoagulation for now unless clinically worsens.   Chronic respriatory failure presumed secondary to sarcoidosis.  Stable on home oxygen regimen.  4 L nasal cannula.  Some  concern for potential Involvement given imaging showing new nodules in the right lung base.  Will need follow-up as outpatient.  Followed by Dr. Lamonte Sakai for pulmonary manifestations, methotrexate was discontinued on 05/2017.  AKI, prerenal, resolved.  Likely Secondary to hypervolemia in setting of anasarca.   Type 2 diabetes, controlled (A1c 5.4 on 09/20/17) on Januvia and Toujeo at home.  We will continue Lantus 20 units twice daily with close blood glucose monitoring.  Hypertension, slightly elevated on admission, currently improved.  Carvedilol was added further blood pressure control, will continue monitor. D/c lisinopril  T2DM, history of poor controlled ( a1c of 9 in the past. Possible this dregree of proteinuria secondary to diabetes. Will follow renal recs. Continue lantus 20 U plus corrective insulin(short acting)  Hyperkalemia, resolved.  Likely in the setting of AKI which is also now resolved.  We will continue to monitor electrolytes while on lisinopril.  Left breast thickening, in the setting of generalized anasarca.  Most likely related however will need follow-up as an outpatient to confirm.   Code Status: Full Code  Family Communication: Husband at bedside  Disposition Plan: improvement in swelling on IV lasix regimen with metolazone added   Consultants:  None  Procedures:  TTE: 09/16/17: EF 60%, moderate LVH, normal diastolic function, no regional wall abnormalities, mild aortic stenosis, LA moderately dilated, PA peak pressure 38 mmHg  Antimicrobials:  none  Cultures:  none  DVT prophylaxis: heparin   Objective: Vitals:   09/20/17 1635 09/20/17 2106 09/21/17 0500 09/21/17 0552  BP: (!) 148/52 (!) 168/58  (!) 137/48  Pulse: 62 69  (!) 59  Resp:  20  18  Temp:  98.8 F (37.1 C)  97.8 F (36.6 C)  TempSrc:  Oral  Oral  SpO2:  96%  100%  Weight:   (!) 138 kg (304 lb 3.8 oz)   Height:        Intake/Output Summary (Last 24 hours) at 09/21/2017  1443 Last data filed at 09/21/2017 0900 Gross per 24 hour  Intake 604 ml  Output 1650 ml  Net -1046 ml   Filed Weights   09/19/17 0522 09/20/17 0556 09/21/17 0500  Weight: (!) 139.1 kg (306 lb 10.6 oz) (!) 140 kg (308 lb 11.2 oz) (!) 138 kg (304 lb 3.8 oz)    Exam:  General: Sitting in bed, in no apparent distres Neck: normal, no appreciable JVD Cardiovascular: regular rate and rhythm, no murmurs, rubs or gallops 3+ pitting edema from ankles to thighs bilaterally, sacrum involved, additionally both arms Respiratory: Normal respiratory effort, lungs clear to auscultation bilaterally,on 4L [place] ( home regimen) Abdomen: soft, Distended, non-tender, normal bowel sounds, no guarding or rebound tenderness Skin: No Rash Neurologic: Grossly no focal neuro deficit.Mental status AAOx3, speech normal, Psychiatric:Appropriate affect, and mood  Data Reviewed: CBC: Recent Labs  Lab 09/15/17 1844  09/16/17 0514 09/16/17 1122 09/17/17 0523 09/18/17 0613 09/19/17 0505  WBC 5.5  --  5.6  --  6.2 5.7 6.1  NEUTROABS 3.7  --  3.6  --   --   --   --   HGB 8.3*   < > 7.6* 8.0* 8.3* 8.1* 7.8*  HCT 27.1*   < > 25.3* 25.3* 26.8* 26.3* 25.2*  MCV 98.9  --  99.6  --  98.9 98.5 97.7  PLT 198  --  193  --  213 190 187   < > = values in this interval not displayed.   Basic Metabolic Panel: Recent Labs  Lab 09/15/17 2333  09/17/17 0523 09/18/17 0613 09/19/17 0505 09/20/17 0500 09/21/17 1018  NA  --    < > 137 137 138 139 138  K  --    < > 5.1 4.7 4.7 4.9 4.6  CL  --    < > 107 105 106 107 104  CO2  --    < > 26 29 30 30 28   GLUCOSE  --    < > 107* 107* 131* 113* 140*  BUN  --    < > 26* 23* 23* 23* 22*  CREATININE  --    < > 0.91 0.95 0.97 0.99 0.93  CALCIUM  --    < > 8.2* 7.8* 7.8* 7.8* 7.8*  MG 1.9  --   --  1.9  --   --   --    < > = values in this interval not displayed.   GFR: Estimated Creatinine Clearance: 93.4 mL/min (by C-G formula based on SCr of 0.93 mg/dL). Liver Function  Tests: Recent Labs  Lab 09/15/17 1844 09/16/17 0514 09/17/17 0523  AST 25 17 19   ALT 15 12* 14  ALKPHOS 118 105 113  BILITOT 0.5 0.2* 0.2*  PROT 7.1 6.4* 7.0  ALBUMIN 2.7* 2.4* 2.8*   No results for input(s): LIPASE, AMYLASE in the last 168 hours. No results for input(s): AMMONIA in the last 168 hours. Coagulation Profile: No results for input(s): INR, PROTIME in the last 168 hours. Cardiac Enzymes: Recent Labs  Lab 09/15/17 2333 09/16/17 0514 09/16/17 1122  TROPONINI <0.03 <0.03 <0.03   BNP (last 3 results) No results for input(s): PROBNP in the last 8760 hours. HbA1C: Recent Labs    09/20/17 0506  HGBA1C  5.4   CBG: Recent Labs  Lab 09/20/17 1144 09/20/17 1649 09/20/17 2109 09/21/17 0736 09/21/17 1204  GLUCAP 137* 191* 156* 92 129*   Lipid Profile: No results for input(s): CHOL, HDL, LDLCALC, TRIG, CHOLHDL, LDLDIRECT in the last 72 hours. Thyroid Function Tests: No results for input(s): TSH, T4TOTAL, FREET4, T3FREE, THYROIDAB in the last 72 hours. Anemia Panel: No results for input(s): VITAMINB12, FOLATE, FERRITIN, TIBC, IRON, RETICCTPCT in the last 72 hours. Urine analysis:    Component Value Date/Time   COLORURINE YELLOW 09/17/2017 1437   APPEARANCEUR HAZY (A) 09/17/2017 1437   LABSPEC 1.015 09/17/2017 1437   PHURINE 6.0 09/17/2017 1437   GLUCOSEU 50 (A) 09/17/2017 1437   HGBUR SMALL (A) 09/17/2017 1437   BILIRUBINUR NEGATIVE 09/17/2017 1437   KETONESUR 5 (A) 09/17/2017 1437   PROTEINUR >=300 (A) 09/17/2017 1437   UROBILINOGEN 1.0 03/03/2014 0942   NITRITE POSITIVE (A) 09/17/2017 1437   LEUKOCYTESUR TRACE (A) 09/17/2017 1437   Sepsis Labs: @LABRCNTIP (procalcitonin:4,lacticidven:4)  )No results found for this or any previous visit (from the past 240 hour(s)).    Studies: US Renal  Result Date: 09/20/2017 CLINICAL DATA:  Renal failure EXAM: RENAL / URINARY TRACT ULTRASOUND COMPLETE COMPARISON:  None. FINDINGS: Right Kidney: Length: 14.6 cm.  Echogenicity within normal limits. No mass or hydronephrosis visualized. Left Kidney: Length: 13.3 cm. Echogenicity within normal limits. No mass or hydronephrosis visualized. Bladder: Decompressed IMPRESSION: No acute renal abnormality is noted. Electronically Signed   By: Inez Catalina M.D.   On: 09/20/2017 19:12    Scheduled Meds: . atorvastatin  20 mg Oral Daily  . carvedilol  6.25 mg Oral BID WC  . fluticasone  1 spray Each Nare Daily  . folic acid  1 mg Oral Daily  . gabapentin  300 mg Oral TID  . heparin  5,000 Units Subcutaneous Q8H  . insulin glargine  20 Units Subcutaneous BID  . mouth rinse  15 mL Mouth Rinse BID  . metolazone  2.5 mg Oral BID  . phenytoin  200 mg Oral BID  . sodium chloride flush  3 mL Intravenous Q12H    Continuous Infusions: . sodium chloride    . furosemide       LOS: 6 days     Desiree Hane, MD [facility] Pager [phone]  If 7PM-7AM, please contact night-coverage [email or web address] Password [facility] 09/21/2017, 2:43 PM

## 2017-09-21 NOTE — Progress Notes (Signed)
[facility] Progress Note  Subjective: no c/o, SOB unchanged, not diuresing very well, 900 cc out yest  Vitals:   09/20/17 1635 09/20/17 2106 09/21/17 0500 09/21/17 0552  BP: (!) 148/52 (!) 168/58  (!) 137/48  Pulse: 62 69  (!) 59  Resp:  20  18  Temp:  98.8 F (37.1 C)  97.8 F (36.6 C)  TempSrc:  Oral  Oral  SpO2:  96%  100%  Weight:   (!) 138 kg (304 lb 3.8 oz)   Height:        Inpatient medications: . atorvastatin  20 mg Oral Daily  . carvedilol  6.25 mg Oral BID WC  . fluticasone  1 spray Each Nare Daily  . folic acid  1 mg Oral Daily  . gabapentin  300 mg Oral TID  . heparin  5,000 Units Subcutaneous Q8H  . insulin glargine  20 Units Subcutaneous BID  . mouth rinse  15 mL Mouth Rinse BID  . phenytoin  200 mg Oral BID  . sodium chloride flush  3 mL Intravenous Q12H   . sodium chloride    . furosemide 120 mg (09/21/17 0512)   sodium chloride, acetaminophen, albuterol, ALPRAZolam, diphenhydrAMINE, HYDROcodone-acetaminophen, ondansetron (ZOFRAN) IV, promethazine, sodium chloride flush, tiZANidine, zolpidem  Exam: Gen obese, no distress, calm No jvd or bruits Chest mostly clear , occ basilar rales RRR no MRG Abd soft ntnd no mass or ascites +bs, + edema lower abd wall Ext diffuse 2-3+ bilat LE pitting edema Neuro is alert, Ox 3 , nf,  UA > 300 prot, 6-30 wbc, 0-5 rbc Urine PCR = 7.9 gm/ 24hr estimated  BUN 23  Cr 0.9  CO2 30  Impression: 1. Proteinuria - diabetes can cause this degree of proteinuria, but is usually less.  2. Vol overload/ anasarca - prob pulm edema as well. Not diuresing well, will ^lasix IV and add zaroxolyn po.  Hold on renal biopsy issues if possible until vol issues resolving.   3. DM long standing, poor control per pt 4. HTN on acei only 5. Obesity 6. Hx sarcoidosis   Plan - as above   Kelly Splinter MD [facility] pager [phone]   09/21/2017, 1:55 PM   Recent Labs  Lab 09/19/17 0505  09/20/17 0500 09/21/17 1018  NA 138 139 138  K 4.7 4.9 4.6  CL 106 107 104  CO2 30 30 28   GLUCOSE 131* 113* 140*  BUN 23* 23* 22*  CREATININE 0.97 0.99 0.93  CALCIUM 7.8* 7.8* 7.8*   Recent Labs  Lab 09/15/17 1844 09/16/17 0514 09/17/17 0523  AST 25 17 19   ALT 15 12* 14  ALKPHOS 118 105 113  BILITOT 0.5 0.2* 0.2*  PROT 7.1 6.4* 7.0  ALBUMIN 2.7* 2.4* 2.8*   Recent Labs  Lab 09/15/17 1844  09/16/17 0514  09/17/17 0523 09/18/17 0613 09/19/17 0505  WBC 5.5  --  5.6  --  6.2 5.7 6.1  NEUTROABS 3.7  --  3.6  --   --   --   --   HGB 8.3*   < > 7.6*   < > 8.3* 8.1* 7.8*  HCT 27.1*   < > 25.3*   < > 26.8* 26.3* 25.2*  MCV 98.9  --  99.6  --  98.9 98.5 97.7  PLT 198  --  193  --  213 190 187   < > = values in this interval not displayed.   Iron/TIBC/Ferritin/ %Sat  Component Value Date/Time   IRON 46 09/16/2017 1530   TIBC 253 09/16/2017 1530   FERRITIN 93 09/16/2017 1530   IRONPCTSAT 18 09/16/2017 1530

## 2017-09-21 NOTE — Progress Notes (Signed)
Physical Therapy Treatment Patient Details Name: Marissa Wilkerson MRN: [id number] DOB: September 19, 1960 Today's Date: 09/21/2017    History of Present Illness Marissa Wilkerson is a 57 y.o. female with medical history significant of sarcoidosis and hypertension with no prior diagnosis of congestive heart failure that came from home with significant shortness of breath generalized swelling increasing lower extremity edema     PT Comments    Pt is slowly progressing with gait and activity tolerance; dyspneic with minimal activity but improving from admission  Follow Up Recommendations  Home health PT     Equipment Recommendations  None recommended by PT    Recommendations for Other Services       Precautions / Restrictions Precautions Precautions: Fall Precaution Comments: DOE, on 4 L. Restrictions Weight Bearing Restrictions: No    Mobility  Bed Mobility Overal bed mobility: Independent                Transfers   Equipment used: Rolling walker (2 wheeled);None Transfers: Sit to/from [employer] to Stand: Supervision Stand pivot transfers: Min guard;Supervision       General transfer comment: incr time, supervision for  line management  Ambulation/Gait Ambulation/Gait assistance: Supervision;Min guard Ambulation Distance (Feet): 12 Feet Assistive device: Rolling walker (2 wheeled) Gait Pattern/deviations: Step-through pattern     General Gait Details: cues for breathing and posture, SPO2 96%  on 4L after amb and 45sec rest   Stairs            Wheelchair Mobility    Modified Rankin (Stroke Patients Only)       Balance     Sitting balance-Leahy Scale: Good       Standing balance-Leahy Scale: Fair                              Cognition Arousal/Alertness: Awake/alert Behavior During Therapy: WFL for tasks assessed/performed Overall Cognitive Status: Within Functional Limits for tasks assessed                                         Exercises      General Comments        Pertinent Vitals/Pain Pain Assessment: No/denies pain    Home Living                      Prior Function            PT Goals (current goals can now be found in the care plan section) Acute Rehab PT Goals Patient Stated Goal: to be able to do for myself PT Goal Formulation: With patient Time For Goal Achievement: 10/01/17 Potential to Achieve Goals: Good Progress towards PT goals: Progressing toward goals    Frequency    Min 3X/week      PT Plan Current plan remains appropriate    Co-evaluation              AM-PAC PT "6 Clicks" Daily Activity  Outcome Measure  Difficulty turning over in bed (including adjusting bedclothes, sheets and blankets)?: None Difficulty moving from lying on back to sitting on the side of the bed? : None Difficulty sitting down on and standing up from a chair with arms (e.g., wheelchair, bedside commode, etc,.)?: A Little Help needed moving to and from a bed to chair (including a wheelchair)?: A  Little Help needed walking in hospital room?: A Little Help needed climbing 3-5 steps with a railing? : A Little 6 Click Score: 20    End of Session Equipment Utilized During Treatment: Oxygen Activity Tolerance: Patient tolerated treatment well Patient left: in chair;with call bell/phone within reach;with family/visitor present Nurse Communication: Mobility status PT Visit Diagnosis: Unsteadiness on feet (R26.81)     Time: 4742-5956 PT Time Calculation (min) (ACUTE ONLY): 20 min  Charges:  $Gait Training: 8-22 mins                    G Codes:         Mina Carlisi 10-04-2017, 10:34 AM

## 2017-09-22 LAB — BASIC METABOLIC PANEL
ANION GAP: 4 — AB (ref 5–15)
ANION GAP: 6 (ref 5–15)
BUN: 23 mg/dL — ABNORMAL HIGH (ref 6–20)
BUN: 25 mg/dL — ABNORMAL HIGH (ref 6–20)
CHLORIDE: 97 mmol/L — AB (ref 101–111)
CHLORIDE: 100 mmol/L — AB (ref 101–111)
CO2: 31 mmol/L (ref 22–32)
CO2: 32 mmol/L (ref 22–32)
CREATININE: 0.91 mg/dL (ref 0.44–1.00)
CREATININE: 0.97 mg/dL (ref 0.44–1.00)
Calcium: 7.8 mg/dL — ABNORMAL LOW (ref 8.9–10.3)
Calcium: 8.3 mg/dL — ABNORMAL LOW (ref 8.9–10.3)
GFR calc non Af Amer: >60 mL/min (ref >60)
GFR calc non Af Amer: >60 mL/min (ref >60)
Glucose, Bld: 114 mg/dL — ABNORMAL HIGH (ref 65–99)
Glucose, Bld: 151 mg/dL — ABNORMAL HIGH (ref 65–99)
POTASSIUM: 4.2 mmol/L (ref 3.5–5.1)
POTASSIUM: 4.4 mmol/L (ref 3.5–5.1)
SODIUM: 132 mmol/L — AB (ref 135–145)
SODIUM: 138 mmol/L (ref 135–145)

## 2017-09-22 LAB — GLUCOSE, CAPILLARY
GLUCOSE-CAPILLARY: 116 mg/dL — AB (ref 65–99)
GLUCOSE-CAPILLARY: 130 mg/dL — AB (ref 65–99)
GLUCOSE-CAPILLARY: 152 mg/dL — AB (ref 65–99)
Glucose-Capillary: 190 mg/dL — ABNORMAL HIGH (ref 65–99)

## 2017-09-22 MED ORDER — INSULIN ASPART 100 UNIT/ML ~~LOC~~ SOLN
0.0000 [IU] | Freq: Three times a day (TID) | SUBCUTANEOUS | Status: DC
  Administered 2017-09-22: 2 [IU] via SUBCUTANEOUS
  Administered 2017-09-23 (×3): 1 [IU] via SUBCUTANEOUS
  Administered 2017-09-24 (×2): 2 [IU] via SUBCUTANEOUS
  Administered 2017-09-25: 3 [IU] via SUBCUTANEOUS
  Administered 2017-09-26 – 2017-09-27 (×3): 2 [IU] via SUBCUTANEOUS
  Administered 2017-09-28: 1 [IU] via SUBCUTANEOUS

## 2017-09-22 NOTE — Progress Notes (Signed)
[facility] Progress Note  Subjective: 3.6 liters OUP yest  Vitals:   09/21/17 2209 09/22/17 0500 09/22/17 0601 09/22/17 1228  BP: (!) 176/65 (!) 138/49  (!) 136/53  Pulse: 69   62  Resp: 19 18  12   Temp: 98.6 F (37 C) 98.4 F (36.9 C)  98.2 F (36.8 C)  TempSrc: Oral Oral  Oral  SpO2: 99% 100%  100%  Weight:   135.4 kg (298 lb 9.6 oz)   Height:        Inpatient medications: . atorvastatin  20 mg Oral Daily  . carvedilol  6.25 mg Oral BID WC  . fluticasone  1 spray Each Nare Daily  . folic acid  1 mg Oral Daily  . gabapentin  300 mg Oral TID  . heparin  5,000 Units Subcutaneous Q8H  . insulin aspart  0-9 Units Subcutaneous TID WC  . insulin glargine  20 Units Subcutaneous BID  . mouth rinse  15 mL Mouth Rinse BID  . metolazone  2.5 mg Oral BID  . phenytoin  200 mg Oral BID  . sodium chloride flush  3 mL Intravenous Q12H   . sodium chloride    . furosemide Stopped (09/22/17 1706)   sodium chloride, acetaminophen, albuterol, ALPRAZolam, diphenhydrAMINE, HYDROcodone-acetaminophen, ondansetron (ZOFRAN) IV, promethazine, sodium chloride flush, tiZANidine, zolpidem  Exam: Gen obese, no distress, calm No jvd or bruits Chest mostly clear , occ basilar rales RRR no MRG Abd soft ntnd no mass or ascites +bs, + edema lower abd wall Ext diffuse 2+ bilat LE pitting edema Neuro is alert, Ox 3 , nf,  UA > 300 prot, 6-30 wbc, 0-5 rbc Urine PCR = 7.9 gm/ 24hr estimated  BUN 23  Cr 0.9  CO2 30  Impression: 1. Proteinuria / vol overload/ anasarca - 7 gm. Diabetes can cause this degree of proteinuria, but is usually less.  Acute worsening of chronic edema. May be new diast HF or could be new glomerular condition. Plan diurese to get her stable vol status and then possibly will do renal biopsy here vs as outpatient 2. DM, long standing, poor control per pt 3. HTN on coreg, holding home acei w/ diuresis 4. Obesity 5. Hx sarcoidosis   Plan - as above   Kelly Splinter MD [facility] pager [phone]   09/22/2017, 8:05 PM   Recent Labs  Lab 09/21/17 1840 09/22/17 0759 09/22/17 1836  NA 138 132* 138  K 4.5 4.2 4.4  CL 102 97* 100*  CO2 30 31 32  GLUCOSE 123* 114* 151*  BUN 23* 23* 25*  CREATININE 0.82 0.91 0.97  CALCIUM 8.2* 7.8* 8.3*   Recent Labs  Lab 09/16/17 0514 09/17/17 0523  AST 17 19  ALT 12* 14  ALKPHOS 105 113  BILITOT 0.2* 0.2*  PROT 6.4* 7.0  ALBUMIN 2.4* 2.8*   Recent Labs  Lab 09/16/17 0514  09/17/17 0523 09/18/17 0613 09/19/17 0505  WBC 5.6  --  6.2 5.7 6.1  NEUTROABS 3.6  --   --   --   --   HGB 7.6*   < > 8.3* 8.1* 7.8*  HCT 25.3*   < > 26.8* 26.3* 25.2*  MCV 99.6  --  98.9 98.5 97.7  PLT 193  --  213 190 187   < > = values in this interval not displayed.   Iron/TIBC/Ferritin/ %Sat    Component Value Date/Time   IRON 46 09/16/2017 1530   TIBC 253 09/16/2017 1530  FERRITIN 93 09/16/2017 1530   IRONPCTSAT 18 09/16/2017 1530

## 2017-09-22 NOTE — Progress Notes (Signed)
PROGRESS NOTE  MAREE AINLEY MOQ:[id number] DOB: 16-Sep-1960 DOA: 09/15/2017 PCP: Jani Gravel, MD  HPI/Recap of past 24 hours:  Marissa Wilkerson is a 57 y.o. year old female with medical history significant for pulmonary sarcoidosis, diabetes,  and hypertension  who presented on 09/15/2017 with progressively worsening SOB and generalized swelling and was found to have profound anasarca.    Improved breathing and swelling. Went to the bathroom a lot to pee. No complaints  Assessment/Plan: Principal Problem:   CHF (congestive heart failure), NYHA class I, unspecified failure chronicity, diastolic (HCC) Active Problems:   Chronic respiratory failure (HCC)   Hypertension   Diabetes mellitus without complication (HCC)   Diastolic dysfunction   Sarcoidosis of lung (HCC)   Hyperlipidemia   Bilateral carotid artery disease (HCC)   Hyperkalemia   CHF (congestive heart failure) (HCC)    Anasarca, likely CHF exacerbation (nephrotic syndrome less likely), improving Better diuresis with addition of metolazone to IV lasix .  Will monitor BMP twice daily none of aggressive diuresis for electrolyte abnormalities abnormalities, daily wgts, I/os  Proteinuria, suspect may be related to poorly controlled diabetes. Nephrology consulted, renal ultrasound within normal limits.  UA greater than 300 protein, urine protein:creatinine: 7.9 g/24 estimated. Normal creatinine  Left leg swelling > right. 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 Discussed with vascular this is not considered a DVT unless causing significant pain. Will hold off on anticoagulation for now unless clinically worsens.   Chronic respriatory failure presumed secondary to sarcoidosis.  Stable on home oxygen regimen.  4 L nasal cannula.  Some concern for potential Involvement given imaging showing new nodules in the right lung base.  Will need follow-up as outpatient.  Followed by Dr. Lamonte Sakai for pulmonary manifestations,  methotrexate was discontinued on 05/2017.  AKI, prerenal, resolved.  Likely Secondary to hypervolemia in setting of anasarca.   Type 2 diabetes, controlled (A1c 5.4 on 09/20/17) on Januvia and Toujeo at home.  We will continue Lantus 20 units twice daily, sliding scale  with close blood glucose monitoring.  Hypertension, slightly elevated on admission, currently improved.  Carvedilol was added further blood pressure control, will continue monitor. D/c lisinopril  T2DM, history of poor controlled ( a1c of 9 in the past. Possible this dregree of proteinuria secondary to diabetes. Will follow renal recs. Continue lantus 20 U plus corrective insulin(short acting)  Hyperkalemia, resolved.  Likely in the setting of AKI which is also now resolved.  We will continue to monitor electrolytes while on lisinopril.  Left breast thickening, in the setting of generalized anasarca.  Most likely related however will need follow-up as an outpatient to confirm.   Code Status: Full Code  Family Communication: Husband at bedside  Disposition Plan: improvement in swelling on IV lasix regimen with metolazone added   Consultants:  None  Procedures:  TTE: 09/16/17: EF 60%, moderate LVH, normal diastolic function, no regional wall abnormalities, mild aortic stenosis, LA moderately dilated, PA peak pressure 38 mmHg  Antimicrobials:  none  Cultures:  none  DVT prophylaxis: heparin   Objective: Vitals:   09/21/17 2209 09/22/17 0500 09/22/17 0601 09/22/17 1228  BP: (!) 176/65 (!) 138/49  (!) 136/53  Pulse: 69   62  Resp: 19 18  12   Temp: 98.6 F (37 C) 98.4 F (36.9 C)  98.2 F (36.8 C)  TempSrc: Oral Oral  Oral  SpO2: 99% 100%  100%  Weight:   135.4 kg (298 lb 9.6 oz)   Height:  Intake/Output Summary (Last 24 hours) at 09/22/2017 1530 Last data filed at 09/22/2017 1400 Gross per 24 hour  Intake 1384 ml  Output 3975 ml  Net -2591 ml   Filed Weights   09/20/17 0556 09/21/17 0500  09/22/17 0601  Weight: (!) 140 kg (308 lb 11.2 oz) (!) 138 kg (304 lb 3.8 oz) 135.4 kg (298 lb 9.6 oz)    Exam:  General: Sitting in bed, in no apparent distres Cardiovascular: regular rate and rhythm, no murmurs, rubs or gallops 2+ pitting edema from ankles to thighs bilaterally Respiratory: Normal respiratory effort, lungs clear to auscultation bilaterally,on 4L [place] ( home regimen) Skin: No Rash Neurologic: Grossly no focal neuro deficit.Mental status AAOx3, speech normal, Psychiatric:Appropriate affect, and mood  Data Reviewed: CBC: Recent Labs  Lab 09/15/17 1844  09/16/17 0514 09/16/17 1122 09/17/17 0523 09/18/17 0613 09/19/17 0505  WBC 5.5  --  5.6  --  6.2 5.7 6.1  NEUTROABS 3.7  --  3.6  --   --   --   --   HGB 8.3*   < > 7.6* 8.0* 8.3* 8.1* 7.8*  HCT 27.1*   < > 25.3* 25.3* 26.8* 26.3* 25.2*  MCV 98.9  --  99.6  --  98.9 98.5 97.7  PLT 198  --  193  --  213 190 187   < > = values in this interval not displayed.   Basic Metabolic Panel: Recent Labs  Lab 09/15/17 2333  09/18/17 0613 09/19/17 0505 09/20/17 0500 09/21/17 1018 09/21/17 1840 09/22/17 0759  NA  --    < > 137 138 139 138 138 132*  K  --    < > 4.7 4.7 4.9 4.6 4.5 4.2  CL  --    < > 105 106 107 104 102 97*  CO2  --    < > 29 30 30 28 30 31   GLUCOSE  --    < > 107* 131* 113* 140* 123* 114*  BUN  --    < > 23* 23* 23* 22* 23* 23*  CREATININE  --    < > 0.95 0.97 0.99 0.93 0.82 0.91  CALCIUM  --    < > 7.8* 7.8* 7.8* 7.8* 8.2* 7.8*  MG 1.9  --  1.9  --   --   --   --   --    < > = values in this interval not displayed.   GFR: Estimated Creatinine Clearance: 94.4 mL/min (by C-G formula based on SCr of 0.91 mg/dL). Liver Function Tests: Recent Labs  Lab 09/15/17 1844 09/16/17 0514 09/17/17 0523  AST 25 17 19   ALT 15 12* 14  ALKPHOS 118 105 113  BILITOT 0.5 0.2* 0.2*  PROT 7.1 6.4* 7.0  ALBUMIN 2.7* 2.4* 2.8*   No results for input(s): LIPASE, AMYLASE in the last 168 hours. No results for  input(s): AMMONIA in the last 168 hours. Coagulation Profile: No results for input(s): INR, PROTIME in the last 168 hours. Cardiac Enzymes: Recent Labs  Lab 09/15/17 2333 09/16/17 0514 09/16/17 1122  TROPONINI <0.03 <0.03 <0.03   BNP (last 3 results) No results for input(s): PROBNP in the last 8760 hours. HbA1C: Recent Labs    09/20/17 0506  HGBA1C 5.4   CBG: Recent Labs  Lab 09/21/17 1204 09/21/17 1646 09/21/17 2206 09/22/17 0726 09/22/17 1150  GLUCAP 129* 118* 184* 116* 130*   Lipid Profile: No results for input(s): CHOL, HDL, LDLCALC, TRIG, CHOLHDL, LDLDIRECT in the last  72 hours. Thyroid Function Tests: No results for input(s): TSH, T4TOTAL, FREET4, T3FREE, THYROIDAB in the last 72 hours. Anemia Panel: No results for input(s): VITAMINB12, FOLATE, FERRITIN, TIBC, IRON, RETICCTPCT in the last 72 hours. Urine analysis:    Component Value Date/Time   COLORURINE YELLOW 09/17/2017 1437   APPEARANCEUR HAZY (A) 09/17/2017 1437   LABSPEC 1.015 09/17/2017 1437   PHURINE 6.0 09/17/2017 1437   GLUCOSEU 50 (A) 09/17/2017 1437   HGBUR SMALL (A) 09/17/2017 1437   BILIRUBINUR NEGATIVE 09/17/2017 1437   KETONESUR 5 (A) 09/17/2017 1437   PROTEINUR >=300 (A) 09/17/2017 1437   UROBILINOGEN 1.0 03/03/2014 0942   NITRITE POSITIVE (A) 09/17/2017 1437   LEUKOCYTESUR TRACE (A) 09/17/2017 1437   Sepsis Labs: @LABRCNTIP (procalcitonin:4,lacticidven:4)  )No results found for this or any previous visit (from the past 240 hour(s)).    Studies: No results found.  Scheduled Meds: . atorvastatin  20 mg Oral Daily  . carvedilol  6.25 mg Oral BID WC  . fluticasone  1 spray Each Nare Daily  . folic acid  1 mg Oral Daily  . gabapentin  300 mg Oral TID  . heparin  5,000 Units Subcutaneous Q8H  . insulin aspart  0-9 Units Subcutaneous TID WC  . insulin glargine  20 Units Subcutaneous BID  . mouth rinse  15 mL Mouth Rinse BID  . metolazone  2.5 mg Oral BID  . phenytoin  200 mg Oral  BID  . sodium chloride flush  3 mL Intravenous Q12H    Continuous Infusions: . sodium chloride    . furosemide Stopped (09/22/17 1042)     LOS: 7 days     Desiree Hane, MD [facility] Pager [phone]  If 7PM-7AM, please contact night-coverage [email or web address] Password [email or web address] 09/22/2017, 3:30 PM

## 2017-09-23 LAB — BASIC METABOLIC PANEL
ANION GAP: 7 (ref 5–15)
Anion gap: 4 — ABNORMAL LOW (ref 5–15)
BUN: 30 mg/dL — AB (ref 6–20)
BUN: 31 mg/dL — ABNORMAL HIGH (ref 6–20)
CHLORIDE: 100 mmol/L — AB (ref 101–111)
CHLORIDE: 98 mmol/L — AB (ref 101–111)
CO2: 33 mmol/L — ABNORMAL HIGH (ref 22–32)
CO2: 32 mmol/L (ref 22–32)
Calcium: 7.9 mg/dL — ABNORMAL LOW (ref 8.9–10.3)
Calcium: 8.2 mg/dL — ABNORMAL LOW (ref 8.9–10.3)
Creatinine, Ser: 1.06 mg/dL — ABNORMAL HIGH (ref 0.44–1.00)
Creatinine, Ser: 1.13 mg/dL — ABNORMAL HIGH (ref 0.44–1.00)
GFR calc Af Amer: >60 mL/min (ref >60)
GFR calc non Af Amer: 58 mL/min — ABNORMAL LOW (ref >60)
GFR calc non Af Amer: 54 mL/min — ABNORMAL LOW (ref >60)
GLUCOSE: 121 mg/dL — AB (ref 65–99)
Glucose, Bld: 143 mg/dL — ABNORMAL HIGH (ref 65–99)
POTASSIUM: 4.5 mmol/L (ref 3.5–5.1)
Potassium: 4.7 mmol/L (ref 3.5–5.1)
Sodium: 137 mmol/L (ref 135–145)
Sodium: 137 mmol/L (ref 135–145)

## 2017-09-23 LAB — GLUCOSE, CAPILLARY
GLUCOSE-CAPILLARY: 160 mg/dL — AB (ref 65–99)
Glucose-Capillary: 123 mg/dL — ABNORMAL HIGH (ref 65–99)
Glucose-Capillary: 139 mg/dL — ABNORMAL HIGH (ref 65–99)
Glucose-Capillary: 143 mg/dL — ABNORMAL HIGH (ref 65–99)

## 2017-09-23 NOTE — Progress Notes (Addendum)
[facility] Progress Note  Subjective: 2.5 L out yest, BP's and HR are stable  Vitals:   09/23/17 0356 09/23/17 0800 09/23/17 1321 09/23/17 1400  BP: (!) 124/43  (!) 137/50 (!) 138/50  Pulse: 62  64 64  Resp: 16  18 16   Temp: 98.2 F (36.8 C)  99.2 F (37.3 C) 99.2 F (37.3 C)  TempSrc: Oral  Oral Oral  SpO2: 99%  97% 97%  Weight: 135.2 kg (298 lb 1 oz) 135.7 kg (299 lb 1.6 oz)    Height:        Inpatient medications: . atorvastatin  20 mg Oral Daily  . carvedilol  6.25 mg Oral BID WC  . fluticasone  1 spray Each Nare Daily  . folic acid  1 mg Oral Daily  . gabapentin  300 mg Oral TID  . heparin  5,000 Units Subcutaneous Q8H  . insulin aspart  0-9 Units Subcutaneous TID WC  . insulin glargine  20 Units Subcutaneous BID  . mouth rinse  15 mL Mouth Rinse BID  . metolazone  2.5 mg Oral BID  . phenytoin  200 mg Oral BID  . sodium chloride flush  3 mL Intravenous Q12H   . sodium chloride    . furosemide Stopped (09/23/17 1708)   sodium chloride, acetaminophen, albuterol, ALPRAZolam, diphenhydrAMINE, HYDROcodone-acetaminophen, ondansetron (ZOFRAN) IV, promethazine, sodium chloride flush, tiZANidine, zolpidem  Exam: Gen obese, no distress, calm No jvd or bruits Chest mostly clear , occ basilar rales RRR no MRG Abd soft ntnd no mass or ascites +bs, + edema lower abd wall Ext 2+ bilat LE pitting edema Neuro is alert, Ox 3 , nf,  UA > 300 prot, 6-30 wbc, 0-5 rbc Urine PCR = 7.9 gm/ 24hr estimated  BUN 23  Cr 0.9  CO2 30 ECHO 09/16/17 > LVEF 60-65%, mod LVH, no diast issues. PA pressure 38 mm peak. IVC was dilated. The respirophasic diameter changes were blunted (< 50%), consistent with elevated central venous pressure    Impression: 1. Proteinuria / vol overload/ anasarca - 7 gm by prot:creat ratio. Acute severe worsening of chronic edema. May be new diast HF and/or new glom disease.  Plan diurese to stabilize vol status then consider renal biopsy.  2. DM,  long standing, poor control per pt 3. HTN on coreg, holding home acei w/ diuresis 4. Obesity 5. Hx sarcoidosis   Plan - as above   Kelly Splinter MD [facility] pager [phone]   09/23/2017, 9:22 PM   Recent Labs  Lab 09/22/17 1836 09/23/17 0830 09/23/17 1710  NA 138 137 137  K 4.4 4.5 4.7  CL 100* 100* 98*  CO2 32 33* 32  GLUCOSE 151* 121* 143*  BUN 25* 30* 31*  CREATININE 0.97 1.06* 1.13*  CALCIUM 8.3* 7.9* 8.2*   Recent Labs  Lab 09/17/17 0523  AST 19  ALT 14  ALKPHOS 113  BILITOT 0.2*  PROT 7.0  ALBUMIN 2.8*   Recent Labs  Lab 09/17/17 0523 09/18/17 0613 09/19/17 0505  WBC 6.2 5.7 6.1  HGB 8.3* 8.1* 7.8*  HCT 26.8* 26.3* 25.2*  MCV 98.9 98.5 97.7  PLT 213 190 187   Iron/TIBC/Ferritin/ %Sat    Component Value Date/Time   IRON 46 09/16/2017 1530   TIBC 253 09/16/2017 1530   FERRITIN 93 09/16/2017 1530   IRONPCTSAT 18 09/16/2017 1530

## 2017-09-23 NOTE — Progress Notes (Signed)
PROGRESS NOTE  Marissa Wilkerson ZOX:[id number] DOB: 1961/05/28 DOA: 09/15/2017 PCP: Jani Gravel, MD  HPI/Recap of past 24 hours:  Marissa Wilkerson is a 57 y.o. year old female with medical history significant for pulmonary sarcoidosis, diabetes,  and hypertension  who presented on 09/15/2017 with progressively worsening SOB and generalized swelling and was found to have profound anasarca.    No complaints this morning.  Continues to note good urine output as well as decrease in lower extremity swelling.  Assessment/Plan: Principal Problem:   CHF (congestive heart failure), NYHA class I, unspecified failure chronicity, diastolic (HCC) Active Problems:   Chronic respiratory failure (HCC)   Hypertension   Diabetes mellitus without complication (HCC)   Diastolic dysfunction   Sarcoidosis of lung (HCC)   Hyperlipidemia   Bilateral carotid artery disease (HCC)   Hyperkalemia   CHF (congestive heart failure) (HCC)    Anasarca, likely CHF exacerbation (nephrotic syndrome less likely), improving 18 diuresis with IV Lasix and metolazone as recommended by nephrology..  Will monitor BMP twice daily and monitor for electrolyte abnormalities, daily wgts, I/os, fluid restriction  Proteinuria, suspect may be related to poorly controlled diabetes. Nephrology consulted, renal ultrasound within normal limits.  UA greater than 300 protein, urine protein:creatinine: 7.9 g/24 estimated. Normal creatinine  Left leg swelling > right. 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 Discussed with vascular this is not considered a DVT unless causing significant pain. Will hold off on anticoagulation for now unless clinically worsens.   Chronic respriatory failure presumed secondary to sarcoidosis.  Stable on home oxygen regimen.  4 L nasal cannula.  Some concern for potential Involvement given imaging showing new nodules in the right lung base.  Will need follow-up as outpatient.  Followed by Dr. Lamonte Sakai for  pulmonary manifestations, methotrexate was discontinued on 05/2017.  AKI, prerenal, resolved.  Likely Secondary to hypervolemia in setting of anasarca.   Type 2 diabetes, controlled (A1c 5.4 on 09/20/17) on Januvia and Toujeo at home.  We will continue Lantus 20 units twice daily, sliding scale  with close blood glucose monitoring.  Hypertension, slightly elevated on admission, currently improved.  Carvedilol was added further blood pressure control, will continue monitor. D/c lisinopril  T2DM, history of poor controlled ( a1c of 9 in the past. Possible this dregree of proteinuria secondary to diabetes. Will follow renal recs. Continue lantus 20 U plus corrective insulin(short acting)  Hyperkalemia, resolved.  Likely in the setting of AKI which is also now resolved.  We will continue to monitor electrolytes while on lisinopril.  Left breast thickening, in the setting of generalized anasarca.  Most likely related however will need follow-up as an outpatient to confirm.   Code Status: Full Code  Family Communication: Husband at bedside  Disposition Plan: improvement in swelling on IV lasix regimen with metolazone added, continue aggressive diuresis   Consultants:  None  Procedures:  TTE: 09/16/17: EF 60%, moderate LVH, normal diastolic function, no regional wall abnormalities, mild aortic stenosis, LA moderately dilated, PA peak pressure 38 mmHg  Antimicrobials:  none  Cultures:  none  DVT prophylaxis: heparin   Objective: Vitals:   09/22/17 2101 09/23/17 0356 09/23/17 0800 09/23/17 1321  BP: (!) 149/45 (!) 124/43  (!) 137/50  Pulse: 70 62  64  Resp:  16  18  Temp: 99.1 F (37.3 C) 98.2 F (36.8 C)  99.2 F (37.3 C)  TempSrc: Oral Oral  Oral  SpO2: 100% 99%  97%  Weight:  135.2 kg (  298 lb 1 oz) 135.7 kg (299 lb 1.6 oz)   Height:        Intake/Output Summary (Last 24 hours) at 09/23/2017 1532 Last data filed at 09/23/2017 1200 Gross per 24 hour  Intake 1464 ml    Output 1550 ml  Net -86 ml   Filed Weights   09/22/17 0601 09/23/17 0356 09/23/17 0800  Weight: 135.4 kg (298 lb 9.6 oz) 135.2 kg (298 lb 1 oz) 135.7 kg (299 lb 1.6 oz)    Exam:  General: Sitting in bed, in no apparent distres Cardiovascular: regular rate and rhythm, no murmurs, rubs or gallops 2+ pitting edema from ankles to thighs bilaterally Respiratory: Normal respiratory effort, lungs clear to auscultation bilaterally,on 4L [place] ( home regimen) Skin: No Rash Neurologic: Grossly no focal neuro deficit.Mental status AAOx3, speech normal, Psychiatric:Appropriate affect, and mood  Data Reviewed: CBC: Recent Labs  Lab 09/17/17 0523 09/18/17 0613 09/19/17 0505  WBC 6.2 5.7 6.1  HGB 8.3* 8.1* 7.8*  HCT 26.8* 26.3* 25.2*  MCV 98.9 98.5 97.7  PLT 213 190 301   Basic Metabolic Panel: Recent Labs  Lab 09/18/17 0613  09/21/17 1018 09/21/17 1840 09/22/17 0759 09/22/17 1836 09/23/17 0830  NA 137   < > 138 138 132* 138 137  K 4.7   < > 4.6 4.5 4.2 4.4 4.5  CL 105   < > 104 102 97* 100* 100*  CO2 29   < > 28 30 31  32 33*  GLUCOSE 107*   < > 140* 123* 114* 151* 121*  BUN 23*   < > 22* 23* 23* 25* 30*  CREATININE 0.95   < > 0.93 0.82 0.91 0.97 1.06*  CALCIUM 7.8*   < > 7.8* 8.2* 7.8* 8.3* 7.9*  MG 1.9  --   --   --   --   --   --    < > = values in this interval not displayed.   GFR: Estimated Creatinine Clearance: 81.1 mL/min (A) (by C-G formula based on SCr of 1.06 mg/dL (H)). Liver Function Tests: Recent Labs  Lab 09/17/17 0523  AST 19  ALT 14  ALKPHOS 113  BILITOT 0.2*  PROT 7.0  ALBUMIN 2.8*   No results for input(s): LIPASE, AMYLASE in the last 168 hours. No results for input(s): AMMONIA in the last 168 hours. Coagulation Profile: No results for input(s): INR, PROTIME in the last 168 hours. Cardiac Enzymes: No results for input(s): CKTOTAL, CKMB, CKMBINDEX, TROPONINI in the last 168 hours. BNP (last 3 results) No results for input(s): PROBNP in the last  8760 hours. HbA1C: No results for input(s): HGBA1C in the last 72 hours. CBG: Recent Labs  Lab 09/22/17 1150 09/22/17 1651 09/22/17 2147 09/23/17 0738 09/23/17 1141  GLUCAP 130* 152* 190* 123* 139*   Lipid Profile: No results for input(s): CHOL, HDL, LDLCALC, TRIG, CHOLHDL, LDLDIRECT in the last 72 hours. Thyroid Function Tests: No results for input(s): TSH, T4TOTAL, FREET4, T3FREE, THYROIDAB in the last 72 hours. Anemia Panel: No results for input(s): VITAMINB12, FOLATE, FERRITIN, TIBC, IRON, RETICCTPCT in the last 72 hours. Urine analysis:    Component Value Date/Time   COLORURINE YELLOW 09/17/2017 1437   APPEARANCEUR HAZY (A) 09/17/2017 1437   LABSPEC 1.015 09/17/2017 1437   PHURINE 6.0 09/17/2017 1437   GLUCOSEU 50 (A) 09/17/2017 1437   HGBUR SMALL (A) 09/17/2017 1437   BILIRUBINUR NEGATIVE 09/17/2017 1437   KETONESUR 5 (A) 09/17/2017 1437   PROTEINUR >=300 (A) 09/17/2017 1437  UROBILINOGEN 1.0 03/03/2014 0942   NITRITE POSITIVE (A) 09/17/2017 1437   LEUKOCYTESUR TRACE (A) 09/17/2017 1437   Sepsis Labs: @LABRCNTIP (procalcitonin:4,lacticidven:4)  )No results found for this or any previous visit (from the past 240 hour(s)).    Studies: No results found.  Scheduled Meds: . atorvastatin  20 mg Oral Daily  . carvedilol  6.25 mg Oral BID WC  . fluticasone  1 spray Each Nare Daily  . folic acid  1 mg Oral Daily  . gabapentin  300 mg Oral TID  . heparin  5,000 Units Subcutaneous Q8H  . insulin aspart  0-9 Units Subcutaneous TID WC  . insulin glargine  20 Units Subcutaneous BID  . mouth rinse  15 mL Mouth Rinse BID  . metolazone  2.5 mg Oral BID  . phenytoin  200 mg Oral BID  . sodium chloride flush  3 mL Intravenous Q12H    Continuous Infusions: . sodium chloride    . furosemide 160 mg (09/23/17 1520)     LOS: 8 days     Desiree Hane, MD [facility] Pager [phone]  If 7PM-7AM, please contact night-coverage [email or web address] Password  [facility] 09/23/2017, 3:32 PM

## 2017-09-24 LAB — BASIC METABOLIC PANEL
Anion gap: 8 (ref 5–15)
Anion gap: 8 (ref 5–15)
BUN: 33 mg/dL — AB (ref 6–20)
BUN: 36 mg/dL — AB (ref 6–20)
CHLORIDE: 98 mmol/L — AB (ref 101–111)
CHLORIDE: 95 mmol/L — AB (ref 101–111)
CO2: 30 mmol/L (ref 22–32)
CO2: 34 mmol/L — AB (ref 22–32)
CREATININE: 1.03 mg/dL — AB (ref 0.44–1.00)
CREATININE: 1.12 mg/dL — AB (ref 0.44–1.00)
Calcium: 8.1 mg/dL — ABNORMAL LOW (ref 8.9–10.3)
Calcium: 8.2 mg/dL — ABNORMAL LOW (ref 8.9–10.3)
GFR calc Af Amer: >60 mL/min (ref >60)
GFR calc Af Amer: >60 mL/min (ref >60)
GFR calc non Af Amer: >60 mL/min (ref >60)
GFR calc non Af Amer: 54 mL/min — ABNORMAL LOW (ref >60)
GLUCOSE: 119 mg/dL — AB (ref 65–99)
GLUCOSE: 163 mg/dL — AB (ref 65–99)
POTASSIUM: 4.2 mmol/L (ref 3.5–5.1)
POTASSIUM: 4.6 mmol/L (ref 3.5–5.1)
SODIUM: 136 mmol/L (ref 135–145)
SODIUM: 137 mmol/L (ref 135–145)

## 2017-09-24 LAB — GLUCOSE, CAPILLARY
GLUCOSE-CAPILLARY: 106 mg/dL — AB (ref 65–99)
GLUCOSE-CAPILLARY: 173 mg/dL — AB (ref 65–99)
Glucose-Capillary: 196 mg/dL — ABNORMAL HIGH (ref 65–99)
Glucose-Capillary: 158 mg/dL — ABNORMAL HIGH (ref 65–99)

## 2017-09-24 MED ORDER — METOLAZONE 5 MG PO TABS
5.0000 mg | ORAL_TABLET | Freq: Two times a day (BID) | ORAL | Status: DC
  Administered 2017-09-24 – 2017-09-26 (×5): 5 mg via ORAL
  Filled 2017-09-24 (×7): qty 1

## 2017-09-24 NOTE — Care Management Important Message (Signed)
Important Message  Patient Details  Name: Marissa Wilkerson MRN: [id number] Date of Birth: 14-Nov-1960   Medicare Important Message Given:  Yes    Kerin Salen 09/24/2017, 12:44 [place] Message  Patient Details  Name: Marissa Wilkerson MRN: [id number] Date of Birth: May 04, 1961   Medicare Important Message Given:  Yes    Kerin Salen 09/24/2017, 12:44 PM

## 2017-09-24 NOTE — Plan of Care (Signed)
  Progressing Skin Integrity: Risk for impaired skin integrity will decrease 09/24/2017 2219 - Progressing by Talbert Forest, RN

## 2017-09-24 NOTE — Progress Notes (Signed)
[facility] Progress Note  Subjective:  Somewhat less UOP past 24 hours - only 2 liters recorded Weight down from a high of 140 to 135.8 and no change past 3 days although recorded neg balance 13 liters this admission (which should be 13 kg...) Looked at CT results with her She notes has been of MTX for sarcoid since October and it was since that time that she had the subacute/acute worsening of her edema...  Current diuretics metolazone 2.5 BID + furosemide 160 Q6H  Vitals:   09/23/17 1400 09/23/17 2155 09/24/17 0358 09/24/17 1319  BP: (!) 138/50 (!) 141/61 (!) 123/45 (!) 123/45  Pulse: 64 65 62 (!) 58  Resp: 16 16 16 17   Temp: 99.2 F (37.3 C) 98.1 F (36.7 C) 97.6 F (36.4 C) 98.1 F (36.7 C)  TempSrc: Oral Oral Oral Oral  SpO2: 97% 100% 99% 100%  Weight:   135.8 kg (299 lb 6.4 oz)   Height:       Exam: Obese pale app WF Very pleasant and talkative NAD Wearing O2 Few basilar crackles Regular. Normal heart sounds. Abd obese, pitting edema lower abd wall, and sacral edema LE edema to hips, hard and woody posteriorly 2+ pitting to knees  Inpatient medications: . atorvastatin  20 mg Oral Daily  . carvedilol  6.25 mg Oral BID WC  . fluticasone  1 spray Each Nare Daily  . folic acid  1 mg Oral Daily  . gabapentin  300 mg Oral TID  . heparin  5,000 Units Subcutaneous Q8H  . insulin aspart  0-9 Units Subcutaneous TID WC  . insulin glargine  20 Units Subcutaneous BID  . mouth rinse  15 mL Mouth Rinse BID  . metolazone  2.5 mg Oral BID  . phenytoin  200 mg Oral BID  . sodium chloride flush  3 mL Intravenous Q12H   . sodium chloride    . furosemide Stopped (09/24/17 1039)   sodium chloride, acetaminophen, albuterol, ALPRAZolam, diphenhydrAMINE, HYDROcodone-acetaminophen, ondansetron (ZOFRAN) IV, promethazine, sodium chloride flush, tiZANidine, zolpidem  Basic Metabolic Panel: Recent Labs    09/23/17 1710 09/24/17 0457  NA 137 136  K 4.7 4.2  CL  98* 98*  CO2 32 30  GLUCOSE 143* 119*  BUN 31* 33*  CREATININE 1.13* 1.03*  CALCIUM 8.2* 8.1*   UA > 300 prot, 6-30 wbc, 0-5 rbc Urine PCR = 7.9 gm/ 24hr estimated  BUN 23  Cr 0.9  CO2 30 ECHO 09/16/17 > LVEF 60-65%, mod LVH, no diast issues. PA pressure 38 mm peak. IVC was dilated. The respirophasic diameter changes were blunted (< 50%), consistent with elevated central venous pressure    Impression: 1. Proteinuria / vol overload/ anasarca - 7 gm by prot:creat ratio. Acute/subacute severe worsening of chronic edema. May be new diast HF and/or new glom disease.  Diuresis has not been spectacular. Weights do not match the net neg recorded fluid balance. (? If consuming more fluid than should - she says not) Has not had any serologic testing done this admission  (re the ? of glomerulopathy other than DM). I do note had neg ANCA, ANA done 2015. HIV neg this admission. GFR stable. ^ metolazone to 5 mg BID/same IV lasix and hope for better UOP response and further weight loss. Hold on renal bx for now (though I do think will ultimately need).   2. DM, long standing, poor control per pt 3. HTN on coreg, holding home acei w/ diuresis 4. Obesity 5.  Hx sarcoidosis - ? Of new lung nodules on CT. Has been off MTX since October (and curiously during that time is when she developed the worsening edema - ? If the MTX was keeping something else in check renal wise in addition to her sarcoid). Needs to revisit going back on the MTX with Dr. Lamonte Sakai. [name]   Jamal Maes, MD [facility] [phone] Pager 09/24/2017, 1:53 PM  Additional objective:  Recent Labs  Lab 09/23/17 0830 09/23/17 1710 09/24/17 0457  NA 137 137 136  K 4.5 4.7 4.2  CL 100* 98* 98*  CO2 33* 32 30  GLUCOSE 121* 143* 119*  BUN 30* 31* 33*  CREATININE 1.06* 1.13* 1.03*  CALCIUM 7.9* 8.2* 8.1*    Recent Labs  Lab 09/18/17 0613 09/19/17 0505  WBC 5.7 6.1  HGB 8.1* 7.8*  HCT 26.3* 25.2*  MCV 98.5 97.7  PLT 190  187   Iron/TIBC/Ferritin/ %Sat    Component Value Date/Time   IRON 46 09/16/2017 1530   TIBC 253 09/16/2017 1530   FERRITIN 93 09/16/2017 1530   IRONPCTSAT 18 09/16/2017 1530

## 2017-09-24 NOTE — Progress Notes (Signed)
Physical Therapy Treatment Patient Details Name: Marissa Wilkerson MRN: [id number] DOB: [birth year] Today's Date: 09/24/2017    History of Present Illness Marissa Wilkerson is a 57 y.o. female with medical history significant of sarcoidosis and hypertension with no prior diagnosis of congestive heart failure that came from home with significant shortness of breath generalized swelling increasing lower extremity edema     PT Comments    Progressing with mobility and activity tolerance. Will continue to follow.    Follow Up Recommendations  Home health PT     Equipment Recommendations  None recommended by PT    Recommendations for Other Services OT consult     Precautions / Restrictions Precautions Precautions: Fall Precaution Comments: DOE, O2 dep Restrictions Weight Bearing Restrictions: No    Mobility  Bed Mobility Overal bed mobility: Modified Independent                Transfers Overall transfer level: Needs assistance Equipment used: Rolling walker (2 wheeled);None Transfers: Sit to/from [facility] Sit to Stand: Supervision Stand pivot transfers: Min guard       General transfer comment: Supervision for standing with RW. Min guard for stand pivot without a device. Stand pivot, bed to bsc.   Ambulation/Gait Ambulation/Gait assistance: Min guard Ambulation Distance (Feet): 52 Feet Assistive device: Rolling walker (2 wheeled) Gait Pattern/deviations: Step-through pattern;Decreased stride length     General Gait Details: close guard for safety. Ambulated on 6L [place] O2 at pt's request. O2 sat reading was 94% on 4L [place] O2 once seated in recliner. Dyspnea 2/4. Pt tolerated distance well.    Stairs            Wheelchair Mobility    Modified Rankin (Stroke Patients Only)       Balance                                            Cognition Arousal/Alertness: Awake/alert Behavior During Therapy: WFL for tasks  assessed/performed Overall Cognitive Status: Within Functional Limits for tasks assessed                                        Exercises      General Comments        Pertinent Vitals/Pain Pain Assessment: No/denies pain    Home Living                      Prior Function            PT Goals (current goals can now be found in the care plan section) Progress towards PT goals: Progressing toward goals    Frequency    Min 3X/week      PT Plan Current plan remains appropriate    Co-evaluation              AM-PAC PT "6 Clicks" Daily Activity  Outcome Measure  Difficulty turning over in bed (including adjusting bedclothes, sheets and blankets)?: None Difficulty moving from lying on back to sitting on the side of the bed? : None Difficulty sitting down on and standing up from a chair with arms (e.g., wheelchair, bedside commode, etc,.)?: A Little Help needed moving to and from a bed to chair (including a wheelchair)?: A Little Help needed walking  in hospital room?: A Little Help needed climbing 3-5 steps with a railing? : A Little 6 Click Score: 20    End of Session Equipment Utilized During Treatment: Oxygen Activity Tolerance: Patient tolerated treatment well Patient left: in chair;with call bell/phone within reach;with family/visitor present   PT Visit Diagnosis: Unsteadiness on feet (R26.81)     Time: 1100-1119 PT Time Calculation (min) (ACUTE ONLY): 19 min  Charges:  $Gait Training: 8-22 mins                    G Codes:          Weston Anna, MPT Pager: [phone]

## 2017-09-24 NOTE — Progress Notes (Signed)
PROGRESS NOTE  Marissa Wilkerson CHY:[id number] DOB: 06/14/1961 DOA: 09/15/2017 PCP: Jani Gravel, MD  HPI/Recap of past 24 hours:  Marissa Wilkerson is a 57 y.o. year old female with medical history significant for pulmonary sarcoidosis, diabetes,  and hypertension  who presented on 09/15/2017 with progressively worsening SOB and generalized swelling and was found to have profound anasarca.    No complaints this morning.    Assessment/Plan: Principal Problem:   CHF (congestive heart failure), NYHA class I, unspecified failure chronicity, diastolic (HCC) Active Problems:   Chronic respiratory failure (HCC)   Hypertension   Diabetes mellitus without complication (HCC)   Diastolic dysfunction   Sarcoidosis of lung (HCC)   Hyperlipidemia   Bilateral carotid artery disease (HCC)   Hyperkalemia   CHF (congestive heart failure) (HCC)    Anasarca, likely CHF exacerbation (nephrotic syndrome less likely), improving Continue aggressive diuresis with IV Lasix and metolazone as recommended by nephrology.-12 L during hospitalization Will monitor BMP twice daily and monitor for electrolyte abnormalities, daily wgts, I/os, fluid restriction  Proteinuria, suspect may be related to poorly controlled diabetes. Nephrology consulted, renal ultrasound within normal limits.  UA greater than 300 protein, urine protein:creatinine: 7.9 g/24 estimated. Normal creatinine  Left leg swelling > right. 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 Discussed with vascular this is not considered a DVT unless causing significant pain. Will hold off on anticoagulation for now unless clinically worsens.   Chronic respriatory failure presumed secondary to sarcoidosis.  Stable on home oxygen regimen.  4 L nasal cannula.  Some concern for potential Involvement given imaging showing new nodules in the right lung base.  Will need follow-up as outpatient.  Followed by Dr. Lamonte Sakai for pulmonary manifestations, methotrexate was  discontinued on 05/2017.  AKI, prerenal, resolved.  Likely Secondary to hypervolemia in setting of anasarca.  Type 2 diabetes, controlled (A1c 5.4 on 09/20/17) on Januvia and Toujeo at home.  Continue Lantus 20 units twice daily, sliding scale  with close blood glucose monitoring.Possible this dregree of proteinuria secondary to diabetes.  Hypertension, slightly elevated on admission, currently improved.  Carvedilol was added further blood pressure control, will continue monitor. D/c lisinopril  Hyperkalemia, resolved.  Likely in the setting of AKI which is also now resolved.  We will continue to monitor electrolytes while on lisinopril.  Left breast thickening, in the setting of generalized anasarca.  Most likely related however will need follow-up as an outpatient to confirm.   Code Status: Full Code  Family Communication:No family at bedside  Disposition Plan: improvement in swelling on IV lasix regimen with metolazone added, continue aggressive diuresis   Consultants:  None  Procedures:  TTE: 09/16/17: EF 60%, moderate LVH, normal diastolic function, no regional wall abnormalities, mild aortic stenosis, LA moderately dilated, PA peak pressure 38 mmHg  Antimicrobials:  none  Cultures:  none  DVT prophylaxis: heparin   Objective: Vitals:   09/23/17 1321 09/23/17 1400 09/23/17 2155 09/24/17 0358  BP: (!) 137/50 (!) 138/50 (!) 141/61 (!) 123/45  Pulse: 64 64 65 62  Resp: 18 16 16 16   Temp: 99.2 F (37.3 C) 99.2 F (37.3 C) 98.1 F (36.7 C) 97.6 F (36.4 C)  TempSrc: Oral Oral Oral Oral  SpO2: 97% 97% 100% 99%  Weight:    135.8 kg (299 lb 6.4 oz)  Height:        Intake/Output Summary (Last 24 hours) at 09/24/2017 1218 Last data filed at 09/24/2017 1100 Gross per 24 hour  Intake  1464 ml  Output 2500 ml  Net -1036 ml   Filed Weights   09/23/17 0356 09/23/17 0800 09/24/17 0358  Weight: 135.2 kg (298 lb 1 oz) 135.7 kg (299 lb 1.6 oz) 135.8 kg (299 lb 6.4 oz)      Exam:  General: lying in bed, in no apparent distres Cardiovascular: regular rate and rhythm, no murmurs, rubs or gallops 2+ pitting edema from ankles to thighs bilaterally Respiratory: Normal respiratory effort, lungs clear to auscultation bilaterally, no rales,on 4L [place] ( home regimen) Skin: No Rash Neurologic: Grossly no focal neuro deficit.Mental status AAOx3, speech normal, Psychiatric:Appropriate affect, and mood  Data Reviewed: CBC: Recent Labs  Lab 09/18/17 0613 09/19/17 0505  WBC 5.7 6.1  HGB 8.1* 7.8*  HCT 26.3* 25.2*  MCV 98.5 97.7  PLT 190 338   Basic Metabolic Panel: Recent Labs  Lab 09/18/17 0613  09/22/17 0759 09/22/17 1836 09/23/17 0830 09/23/17 1710 09/24/17 0457  NA 137   < > 132* 138 137 137 136  K 4.7   < > 4.2 4.4 4.5 4.7 4.2  CL 105   < > 97* 100* 100* 98* 98*  CO2 29   < > 31 32 33* 32 30  GLUCOSE 107*   < > 114* 151* 121* 143* 119*  BUN 23*   < > 23* 25* 30* 31* 33*  CREATININE 0.95   < > 0.91 0.97 1.06* 1.13* 1.03*  CALCIUM 7.8*   < > 7.8* 8.3* 7.9* 8.2* 8.1*  MG 1.9  --   --   --   --   --   --    < > = values in this interval not displayed.   GFR: Estimated Creatinine Clearance: 83.6 mL/min (A) (by C-G formula based on SCr of 1.03 mg/dL (H)). Liver Function Tests: No results for input(s): AST, ALT, ALKPHOS, BILITOT, PROT, ALBUMIN in the last 168 hours. No results for input(s): LIPASE, AMYLASE in the last 168 hours. No results for input(s): AMMONIA in the last 168 hours. Coagulation Profile: No results for input(s): INR, PROTIME in the last 168 hours. Cardiac Enzymes: No results for input(s): CKTOTAL, CKMB, CKMBINDEX, TROPONINI in the last 168 hours. BNP (last 3 results) No results for input(s): PROBNP in the last 8760 hours. HbA1C: No results for input(s): HGBA1C in the last 72 hours. CBG: Recent Labs  Lab 09/23/17 1141 09/23/17 1702 09/23/17 2153 09/24/17 0739 09/24/17 1142  GLUCAP 139* 143* 160* 106* 196*   Lipid  Profile: No results for input(s): CHOL, HDL, LDLCALC, TRIG, CHOLHDL, LDLDIRECT in the last 72 hours. Thyroid Function Tests: No results for input(s): TSH, T4TOTAL, FREET4, T3FREE, THYROIDAB in the last 72 hours. Anemia Panel: No results for input(s): VITAMINB12, FOLATE, FERRITIN, TIBC, IRON, RETICCTPCT in the last 72 hours. Urine analysis:    Component Value Date/Time   COLORURINE YELLOW 09/17/2017 1437   APPEARANCEUR HAZY (A) 09/17/2017 1437   LABSPEC 1.015 09/17/2017 1437   PHURINE 6.0 09/17/2017 1437   GLUCOSEU 50 (A) 09/17/2017 1437   HGBUR SMALL (A) 09/17/2017 1437   BILIRUBINUR NEGATIVE 09/17/2017 1437   KETONESUR 5 (A) 09/17/2017 1437   PROTEINUR >=300 (A) 09/17/2017 1437   UROBILINOGEN 1.0 03/03/2014 0942   NITRITE POSITIVE (A) 09/17/2017 1437   LEUKOCYTESUR TRACE (A) 09/17/2017 1437   Sepsis Labs: @LABRCNTIP (procalcitonin:4,lacticidven:4)  )No results found for this or any previous visit (from the past 240 hour(s)).    Studies: No results found.  Scheduled Meds: . atorvastatin  20 mg Oral  Daily  . carvedilol  6.25 mg Oral BID WC  . fluticasone  1 spray Each Nare Daily  . folic acid  1 mg Oral Daily  . gabapentin  300 mg Oral TID  . heparin  5,000 Units Subcutaneous Q8H  . insulin aspart  0-9 Units Subcutaneous TID WC  . insulin glargine  20 Units Subcutaneous BID  . mouth rinse  15 mL Mouth Rinse BID  . metolazone  2.5 mg Oral BID  . phenytoin  200 mg Oral BID  . sodium chloride flush  3 mL Intravenous Q12H    Continuous Infusions: . sodium chloride    . furosemide Stopped (09/24/17 1039)     LOS: 9 days     Desiree Hane, MD [facility] Pager [phone]  If 7PM-7AM, please contact night-coverage [email or web address] Password [facility] 09/24/2017, 12:18 PM

## 2017-09-25 LAB — GLUCOSE, CAPILLARY
GLUCOSE-CAPILLARY: 161 mg/dL — AB (ref 65–99)
Glucose-Capillary: 107 mg/dL — ABNORMAL HIGH (ref 65–99)
Glucose-Capillary: 204 mg/dL — ABNORMAL HIGH (ref 65–99)
Glucose-Capillary: 176 mg/dL — ABNORMAL HIGH (ref 65–99)

## 2017-09-25 LAB — RENAL FUNCTION PANEL
Albumin: 2.9 g/dL — ABNORMAL LOW (ref 3.5–5.0)
Anion gap: 8 (ref 5–15)
BUN: 42 mg/dL — AB (ref 6–20)
CO2: 34 mmol/L — ABNORMAL HIGH (ref 22–32)
CREATININE: 1.15 mg/dL — AB (ref 0.44–1.00)
Calcium: 8.4 mg/dL — ABNORMAL LOW (ref 8.9–10.3)
Chloride: 93 mmol/L — ABNORMAL LOW (ref 101–111)
GFR calc Af Amer: >60 mL/min (ref >60)
GFR, EST NON AFRICAN AMERICAN: 53 mL/min — AB (ref >60)
Glucose, Bld: 104 mg/dL — ABNORMAL HIGH (ref 65–99)
PHOSPHORUS: 4 mg/dL (ref 2.5–4.6)
Potassium: 4.1 mmol/L (ref 3.5–5.1)
SODIUM: 135 mmol/L (ref 135–145)

## 2017-09-25 NOTE — Progress Notes (Addendum)
[facility] Progress Note  Subjective:  Improved UOP with ^ metolazone Weight down about 1 kg She feels like her abd is less tight and that maybe LE edema is some better  She notes has been of MTX for sarcoid since October and it was since that time that she had the subacute/acute worsening of her edema...  Current diuretics metolazone 5 mg BID + furosemide 160 Q6H  Vitals:   09/24/17 0358 09/24/17 1319 09/24/17 2123 09/25/17 0500  BP: (!) 123/45 (!) 123/45 (!) 170/66 (!) 141/63  Pulse: 62 (!) 58 66 62  Resp: 16 17 18 16   Temp: 97.6 F (36.4 C) 98.1 F (36.7 C) 98.8 F (37.1 C) 98 F (36.7 C)  TempSrc: Oral Oral Oral Oral  SpO2: 99% 100% 100% 98%  Weight: 135.8 kg (299 lb 6.4 oz)   134.6 kg (296 lb 12.8 oz)  Height:       Exam: Obese pale app WF Very pleasant and talkative NAD Wearing O2 Few basilar crackles Regular. Normal heart sounds. Abd obese, pitting edema lower abd wall, and sacral edema not much change but abd is soft LE edema to hips, hard and woody posteriorly - decreased past 24 hours 2+ pitting to knees - down some since yesterday  Inpatient medications: . atorvastatin  20 mg Oral Daily  . carvedilol  6.25 mg Oral BID WC  . fluticasone  1 spray Each Nare Daily  . folic acid  1 mg Oral Daily  . gabapentin  300 mg Oral TID  . heparin  5,000 Units Subcutaneous Q8H  . insulin aspart  0-9 Units Subcutaneous TID WC  . insulin glargine  20 Units Subcutaneous BID  . mouth rinse  15 mL Mouth Rinse BID  . metolazone  5 mg Oral BID  . phenytoin  200 mg Oral BID  . sodium chloride flush  3 mL Intravenous Q12H   . sodium chloride    . furosemide Stopped (09/25/17 1145)   sodium chloride, acetaminophen, albuterol, ALPRAZolam, diphenhydrAMINE, HYDROcodone-acetaminophen, ondansetron (ZOFRAN) IV, promethazine, sodium chloride flush, tiZANidine, zolpidem  Recent Labs    09/24/17 1641 09/25/17 0517  NA 137 135  K 4.6 4.1  CL 95* 93*  CO2 34*  34*  GLUCOSE 163* 104*  BUN 36* 42*  CREATININE 1.12* 1.15*  CALCIUM 8.2* 8.4*  PHOS  --  4.0   UA > 300 prot, 6-30 wbc, 0-5 rbc Urine PCR = 7.9 gm/ 24hr estimated  BUN 23  Cr 0.9  CO2 30 ECHO 09/16/17 > LVEF 60-65%, mod LVH, no diast issues. PA pressure 38 mm peak. IVC was dilated. The respirophasic diameter changes were blunted (< 50%), consistent with elevated central venous pressure    Additional objective:  Recent Labs  Lab 09/24/17 0457 09/24/17 1641 09/25/17 0517  NA 136 137 135  K 4.2 4.6 4.1  CL 98* 95* 93*  CO2 30 34* 34*  GLUCOSE 119* 163* 104*  BUN 33* 36* 42*  CREATININE 1.03* 1.12* 1.15*  CALCIUM 8.1* 8.2* 8.4*  PHOS  --   --  4.0    Recent Labs  Lab 09/19/17 0505  WBC 6.1  HGB 7.8*  HCT 25.2*  MCV 97.7  PLT 187   Iron/TIBC/Ferritin/ %Sat    Component Value Date/Time   IRON 46 09/16/2017 1530   TIBC 253 09/16/2017 1530   FERRITIN 93 09/16/2017 1530   IRONPCTSAT 18 09/16/2017 1530    Impression: 1. Proteinuria / vol overload/ anasarca - 7 gm  by prot:creat ratio. Acute/subacute severe worsening of chronic edema. May be new diast HF and/or new glom disease. Has not had any serologic testing done this admission  (re the ? of glomerulopathy other than DM). I do note had neg ANCA, ANA done 2015. HIV neg this admission. GFR stable. ^'d metolazone to 5 mg BID/same IV lasix 1/28 with improved diuresis 2.8 liters past 24 hours and electrolytes stable. Continue with this program for right now. Hold on renal bx for now (though I do think will ultimately need).   2. DM, long standing, poor control per pt 3. HTN on coreg, holding home acei w/ diuresis 4. Obesity 5. Hx sarcoidosis - ? Of new lung nodules on CT. Has been off MTX since October (and curiously during that time is when she developed the worsening edema - ? If the MTX was keeping something else in check renal wise in addition to her sarcoid). Needs to revisit going back on the MTX with Dr. Lamonte Sakai.    Jamal Maes, MD [facility] [phone] Pager 09/25/2017, 12:15 PM

## 2017-09-25 NOTE — Progress Notes (Signed)
PROGRESS NOTE  Marissa Wilkerson WIO:[id number] DOB: [birth year] DOA: 09/15/2017 PCP: Jani Gravel, MD  HPI/Recap of past 24 hours:  Marissa Wilkerson is a 57 y.o. year old female with medical history significant for pulmonary sarcoidosis, diabetes,  and hypertension  who presented on 09/15/2017 with progressively worsening SOB and generalized swelling and was found to have profound anasarca related to newly diagnosed diastolic CHF exacerbation, though some concerns for nephrotic syndrome given significant proteinuria.  Patient initially diuresed with IV Lasix but with inadequate output metolazone was added, dose increased on 1/28.  Nephrology consulted given significant proteinuria and have helped in establishing better diuresis.    Doing well. Good output. Continued improvement in swelling ( abdomen and legs). No acute complaints.  Assessment/Plan: Principal Problem:   CHF (congestive heart failure), NYHA class I, unspecified failure chronicity, diastolic (HCC) Active Problems:   Chronic respiratory failure (HCC)   Hypertension   Diabetes mellitus without complication (HCC)   Diastolic dysfunction   Sarcoidosis of lung (HCC)   Hyperlipidemia   Bilateral carotid artery disease (HCC)   Hyperkalemia   CHF (congestive heart failure) (HCC)    Anasarca, likely CHF exacerbation (nephrotic syndrome less likely), improving Continue aggressive diuresis with IV Lasix 160mg  q6 h and metolazone 5 mg BID as recommended by nephrology. TTE on this admission shows diastolic dysfunction.-14 L during hospitalization Will monitor BMP twice daily and monitor for electrolyte abnormalities, daily wgts, I/os, fluid restriction. ? If methotrexate she was taking for sarcoidosis was helping keep kidneys in check ( timeline of discontinuation correlates with worsening symptoms), discuss with Dr. Elmarie Shiley pulmonologist)  Proteinuria, suspect may be related to poorly controlled diabetes. Nephrology consulted given  significant proteinuria with concern for nephrotic syndrome, renal ultrasound within normal limits.  UA greater than 300 protein, urine protein:creatinine: 7.9 g/24 estimated. Normal creatinine. Monitoring BMP, will likely still need biopsy once volume status stablized  Left leg swelling > right. 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 Discussed with vascular this is not considered a DVT unless causing significant pain. Will hold off on anticoagulation for now unless clinically worsens.   Chronic respriatory failure presumed secondary to sarcoidosis.  Stable on home oxygen regimen.  4 L nasal cannula.  Some concern for potential Involvement given imaging showing new nodules in the right lung base.  Will need follow-up as outpatient.  Followed by Dr. Lamonte Sakai for pulmonary manifestations, methotrexate was discontinued on 05/2017.  AKI, prerenal, resolved.  Likely Secondary to hypervolemia in setting of anasarca.  Type 2 diabetes, controlled (A1c 5.4 on 09/20/17) on Januvia and Toujeo at home.  Continue Lantus 20 units twice daily, sliding scale  with close blood glucose monitoring.Possible this dregree of proteinuria secondary to diabetes.  Hypertension, controlled.  Carvedilol was added for further blood pressure control, will continue monitor. D/c'd home lisinopril  Hyperkalemia, resolved.  Likely in the setting of AKI which is also now resolved.  We will continue to monitor electrolytes while on aggressive diuresis regimen  Left breast thickening, in the setting of generalized anasarca.  Most likely related however will need follow-up as an outpatient to confirm.   Code Status: Full Code  Family Communication:No family at bedside  Disposition Plan: improvement in swelling on IV lasix regimen with metolazone added, continue aggressive diuresis   Consultants:  None  Procedures:  TTE: 09/16/17: EF 60%, moderate LVH, normal diastolic function, no regional wall abnormalities, mild  aortic stenosis, LA moderately dilated, PA peak pressure 38 mmHg  Antimicrobials:  none  Cultures:  none  DVT prophylaxis: heparin   Objective: Vitals:   09/24/17 1319 09/24/17 2123 09/25/17 0500 09/25/17 1301  BP: (!) 123/45 (!) 170/66 (!) 141/63 140/60  Pulse: (!) 58 66 62 66  Resp: 17 18 16 18   Temp: 98.1 F (36.7 C) 98.8 F (37.1 C) 98 F (36.7 C) 99 F (37.2 C)  TempSrc: Oral Oral Oral Oral  SpO2: 100% 100% 98% 99%  Weight:   134.6 kg (296 lb 12.8 oz)   Height:        Intake/Output Summary (Last 24 hours) at 09/25/2017 1516 Last data filed at 09/25/2017 1300 Gross per 24 hour  Intake 1508 ml  Output 2500 ml  Net -992 ml   Filed Weights   09/23/17 0800 09/24/17 0358 09/25/17 0500  Weight: 135.7 kg (299 lb 1.6 oz) 135.8 kg (299 lb 6.4 oz) 134.6 kg (296 lb 12.8 oz)    Exam:  General: lying in bed, in no apparent distres Cardiovascular: regular rate and rhythm, no murmurs, rubs or gallops 2+ pitting edema from ankles to thighs bilaterally Respiratory: Normal respiratory effort, lungs clear to auscultation bilaterally, no rales,on 4L  ( home regimen) Skin: No Rash Neurologic: Grossly no focal neuro deficit.Mental status AAOx3, speech normal, Psychiatric:Appropriate affect, and mood  Data Reviewed: CBC: Recent Labs  Lab 09/19/17 0505  WBC 6.1  HGB 7.8*  HCT 25.2*  MCV 97.7  PLT 161   Basic Metabolic Panel: Recent Labs  Lab 09/23/17 0830 09/23/17 1710 09/24/17 0457 09/24/17 1641 09/25/17 0517  NA 137 137 136 137 135  K 4.5 4.7 4.2 4.6 4.1  CL 100* 98* 98* 95* 93*  CO2 33* 32 30 34* 34*  GLUCOSE 121* 143* 119* 163* 104*  BUN 30* 31* 33* 36* 42*  CREATININE 1.06* 1.13* 1.03* 1.12* 1.15*  CALCIUM 7.9* 8.2* 8.1* 8.2* 8.4*  PHOS  --   --   --   --  4.0   GFR: Estimated Creatinine Clearance: 74.4 mL/min (A) (by C-G formula based on SCr of 1.15 mg/dL (H)). Liver Function Tests: Recent Labs  Lab 09/25/17 0517  ALBUMIN 2.9*   No results  for input(s): LIPASE, AMYLASE in the last 168 hours. No results for input(s): AMMONIA in the last 168 hours. Coagulation Profile: No results for input(s): INR, PROTIME in the last 168 hours. Cardiac Enzymes: No results for input(s): CKTOTAL, CKMB, CKMBINDEX, TROPONINI in the last 168 hours. BNP (last 3 results) No results for input(s): PROBNP in the last 8760 hours. HbA1C: No results for input(s): HGBA1C in the last 72 hours. CBG: Recent Labs  Lab 09/24/17 1142 09/24/17 1616 09/24/17 2122 09/25/17 0900 09/25/17 1137  GLUCAP 196* 158* 173* 107* 204*   Lipid Profile: No results for input(s): CHOL, HDL, LDLCALC, TRIG, CHOLHDL, LDLDIRECT in the last 72 hours. Thyroid Function Tests: No results for input(s): TSH, T4TOTAL, FREET4, T3FREE, THYROIDAB in the last 72 hours. Anemia Panel: No results for input(s): VITAMINB12, FOLATE, FERRITIN, TIBC, IRON, RETICCTPCT in the last 72 hours. Urine analysis:    Component Value Date/Time   COLORURINE YELLOW 09/17/2017 1437   APPEARANCEUR HAZY (A) 09/17/2017 1437   LABSPEC 1.015 09/17/2017 1437   PHURINE 6.0 09/17/2017 1437   GLUCOSEU 50 (A) 09/17/2017 1437   HGBUR SMALL (A) 09/17/2017 1437   BILIRUBINUR NEGATIVE 09/17/2017 1437   KETONESUR 5 (A) 09/17/2017 1437   PROTEINUR >=300 (A) 09/17/2017 1437   UROBILINOGEN 1.0 03/03/2014 0942   NITRITE POSITIVE (A) 09/17/2017 1437  LEUKOCYTESUR TRACE (A) 09/17/2017 1437   Sepsis Labs: @LABRCNTIP (procalcitonin:4,lacticidven:4)  )No results found for this or any previous visit (from the past 240 hour(s)).    Studies: No results found.  Scheduled Meds: . atorvastatin  20 mg Oral Daily  . carvedilol  6.25 mg Oral BID WC  . fluticasone  1 spray Each Nare Daily  . folic acid  1 mg Oral Daily  . gabapentin  300 mg Oral TID  . heparin  5,000 Units Subcutaneous Q8H  . insulin aspart  0-9 Units Subcutaneous TID WC  . insulin glargine  20 Units Subcutaneous BID  . mouth rinse  15 mL Mouth Rinse  BID  . metolazone  5 mg Oral BID  . phenytoin  200 mg Oral BID  . sodium chloride flush  3 mL Intravenous Q12H    Continuous Infusions: . sodium chloride    . furosemide Stopped (09/25/17 1145)     LOS: 10 days     Desiree Hane, MD [facility] Pager [phone]  If 7PM-7AM, please contact night-coverage [email or web address] Password [facility] 09/25/2017, 3:16 PM

## 2017-09-25 NOTE — Plan of Care (Signed)
  Skin Integrity: Risk for impaired skin integrity will decrease 09/25/2017 2204 - Progressing by Ashley Murrain, RN

## 2017-09-26 DIAGNOSIS — E875 Hyperkalemia: Secondary | ICD-10-CM

## 2017-09-26 DIAGNOSIS — R601 Generalized edema: Secondary | ICD-10-CM

## 2017-09-26 DIAGNOSIS — J9610 Chronic respiratory failure, unspecified whether with hypoxia or hypercapnia: Secondary | ICD-10-CM

## 2017-09-26 DIAGNOSIS — D860 Sarcoidosis of lung: Secondary | ICD-10-CM

## 2017-09-26 DIAGNOSIS — N049 Nephrotic syndrome with unspecified morphologic changes: Secondary | ICD-10-CM

## 2017-09-26 DIAGNOSIS — E119 Type 2 diabetes mellitus without complications: Secondary | ICD-10-CM

## 2017-09-26 LAB — RENAL FUNCTION PANEL
Albumin: 2.7 g/dL — ABNORMAL LOW (ref 3.5–5.0)
Anion gap: 8 (ref 5–15)
BUN: 40 mg/dL — AB (ref 6–20)
CHLORIDE: 93 mmol/L — AB (ref 101–111)
CO2: 37 mmol/L — AB (ref 22–32)
Calcium: 8.3 mg/dL — ABNORMAL LOW (ref 8.9–10.3)
Creatinine, Ser: 1.06 mg/dL — ABNORMAL HIGH (ref 0.44–1.00)
GFR calc Af Amer: >60 mL/min (ref >60)
GFR calc non Af Amer: 58 mL/min — ABNORMAL LOW (ref >60)
GLUCOSE: 98 mg/dL (ref 65–99)
POTASSIUM: 4 mmol/L (ref 3.5–5.1)
Phosphorus: 3.7 mg/dL (ref 2.5–4.6)
Sodium: 138 mmol/L (ref 135–145)

## 2017-09-26 LAB — GLUCOSE, CAPILLARY
GLUCOSE-CAPILLARY: 160 mg/dL — AB (ref 65–99)
GLUCOSE-CAPILLARY: 187 mg/dL — AB (ref 65–99)
Glucose-Capillary: 99 mg/dL (ref 65–99)
Glucose-Capillary: 155 mg/dL — ABNORMAL HIGH (ref 65–99)

## 2017-09-26 MED ORDER — ZOLPIDEM TARTRATE 5 MG PO TABS: 5.0000 mg | ORAL_TABLET | Freq: Every evening | ORAL | Status: DC | PRN

## 2017-09-26 MED FILL — HYDROCODON-APAP 10-325: 10-325 | 30 days supply | Qty: 180 | Fill #0

## 2017-09-26 NOTE — Progress Notes (Signed)
Physical Therapy Treatment Patient Details Name: Marissa Wilkerson MRN: [id number] DOB: [birth year] Today's Date: 09/26/2017    History of Present Illness Marissa Wilkerson is a 57 y.o. female with medical history significant of sarcoidosis and hypertension with no prior diagnosis of congestive heart failure that came from home with significant shortness of breath generalized swelling increasing lower extremity edema     PT Comments    Progressing with mobility. Pt tolerated session well.    Follow Up Recommendations  Home health PT     Equipment Recommendations  None recommended by PT    Recommendations for Other Services OT consult     Precautions / Restrictions Precautions Precautions: Fall Precaution Comments: DOE, on 4 L at baseline Restrictions Weight Bearing Restrictions: No    Mobility  Bed Mobility Overal bed mobility: Modified Independent                Transfers Overall transfer level: Needs assistance Equipment used: Rolling walker (2 wheeled) Transfers: Sit to/from Stand Sit to Stand: Supervision         General transfer comment: for safety  Ambulation/Gait Ambulation/Gait assistance: Min guard Ambulation Distance (Feet): 70 Feet Assistive device: Rolling walker (2 wheeled) Gait Pattern/deviations: Step-through pattern;Decreased stride length     General Gait Details: close guard for safety. dyspnea 2/4. O2 sat 91% on 4L [place] O2 during ambulation.    Stairs            Wheelchair Mobility    Modified Rankin (Stroke Patients Only)       Balance                                            Cognition Arousal/Alertness: Awake/alert Behavior During Therapy: WFL for tasks assessed/performed Overall Cognitive Status: Within Functional Limits for tasks assessed                                        Exercises      General Comments        Pertinent Vitals/Pain Pain Assessment: Faces Faces Pain  Scale: Hurts even more Pain Location: bil LEs Pain Descriptors / Indicators: Sore Pain Intervention(s): Limited activity within patient's tolerance    Home Living                      Prior Function            PT Goals (current goals can now be found in the care plan section) Progress towards PT goals: Progressing toward goals    Frequency    Min 3X/week      PT Plan Current plan remains appropriate    Co-evaluation              AM-PAC PT "6 Clicks" Daily Activity  Outcome Measure  Difficulty turning over in bed (including adjusting bedclothes, sheets and blankets)?: None Difficulty moving from lying on back to sitting on the side of the bed? : None Difficulty sitting down on and standing up from a chair with arms (e.g., wheelchair, bedside commode, etc,.)?: A Little Help needed moving to and from a bed to chair (including a wheelchair)?: A Little Help needed walking in hospital room?: A Little Help needed climbing 3-5 steps with a railing? : A Little 6  Click Score: 20    End of Session Equipment Utilized During Treatment: Oxygen Activity Tolerance: Patient limited by pain Patient left: in chair;with call bell/phone within reach;with family/visitor present   PT Visit Diagnosis: Unsteadiness on feet (R26.81)     Time: 1350-1400 PT Time Calculation (min) (ACUTE ONLY): 10 min  Charges:  $Gait Training: 8-22 mins                    G Codes:          Weston Anna, MPT Pager: [phone]

## 2017-09-26 NOTE — Progress Notes (Signed)
PROGRESS NOTE    Marissa Wilkerson  WRU:[id number] DOB: Apr 14, 1961 DOA: 09/15/2017 PCP: Jani Gravel, MD      Brief Narrative:  Marissa Wilkerson is a 57 y.o. year old female with medical history significant for pulmonary sarcoidosis, diabetes,  and hypertension  who presented on 09/15/2017 with progressively worsening SOB and generalized swelling and was found to have profound anasarca related to newly diagnosed diastolic CHF exacerbation, though some concerns for nephrotic syndrome given significant proteinuria.  Patient initially diuresed with IV Lasix but with inadequate output metolazone was added, dose increased on 1/28.  Nephrology consulted given significant proteinuria and have helped in establishing better diuresis.      Assessment & Plan:  Principal Problem:   CHF (congestive heart failure), NYHA class I, unspecified failure chronicity, diastolic (HCC) Active Problems:   Chronic respiratory failure (HCC)   Hypertension   Diabetes mellitus without complication (HCC)   Diastolic dysfunction   Sarcoidosis of lung (HCC)   Hyperlipidemia   Bilateral carotid artery disease (HCC)   Hyperkalemia   CHF (congestive heart failure) (HCC)   Anasarca Proteinuria AKI Creatinine baseline 0.9, up to 1.3 on admission. Development of anasarca and diuretic failure and nephrotic range proteinuria after stopping methotrexate raises question of intrinsic renal disease. -Continue Lasix and metolazone, increased doses -I have sent a message to Dr. Lamonte Sakai about restarting methotrexate in light of interim new lung nodules   Sarcoidosis Chronic respiratory failure Stable on home O2  Diabetes -Continue Lantus -SSI  Hypertension -Continue carvedilol, statin  Other medications -Continue gabapentin, Norco, Xanax, Dilantin       Family Communication: Husband at bedside Disposition Plan: Nephrology finally diuresing with current dose furosemide, metolazone.  Nephrology plans to taper back  tomorrow or Friday; will need at least 24 hours on oral regimen to ensure effective before discharge.   Consultants:   nephrology    Subjective: Feels better.  Swelling somewhat better, still in legs.  No chest pain, confusion, dyspnea, fever, change in breathing.  Objective: Vitals:   09/25/17 1301 09/25/17 1725 09/25/17 2118 09/26/17 0558  BP: 140/60 (!) 152/51 (!) 139/39 (!) 121/45  Pulse: 66 68 65 65  Resp: 18  18 20   Temp: 99 F (37.2 C)  97.8 F (36.6 C) 98 F (36.7 C)  TempSrc: Oral  Oral Oral  SpO2: 99%  100% 100%  Weight:    132.7 kg (292 lb 8 oz)  Height:        Intake/Output Summary (Last 24 hours) at 09/26/2017 1156 Last data filed at 09/26/2017 0916 Gross per 24 hour  Intake 1448 ml  Output 4450 ml  Net -3002 ml   Filed Weights   09/24/17 0358 09/25/17 0500 09/26/17 0558  Weight: 135.8 kg (299 lb 6.4 oz) 134.6 kg (296 lb 12.8 oz) 132.7 kg (292 lb 8 oz)    Examination: General appearance: Obese adult female, alert and in no acute distress.   HEENT: Anicteric, conjunctiva pink, lids and lashes normal. No nasal deformity, discharge, epistaxis.  Lips moist.   Skin: Warm and dry.  No jaundice.  No suspicious rashes or lesions. Cardiac: RRR, nl S1-S2, no murmurs appreciated.  Capillary refill is brisk.  JVP not visible.  1+ LE edema.   Respiratory: Normal respiratory rate and rhythm.  CTAB without rales or wheezes. Abdomen: Abdomen soft.  No TTP. No ascites, distension, hepatosplenomegaly.   MSK: No deformities or effusions. Neuro: Awake and alert.  EOMI, moves all extremities. Speech fluent.  Psych: Sensorium intact and responding to questions, attention normal. Affect normal.  Judgment and insight appear normal.    Data Reviewed: I have personally reviewed following labs and imaging studies:  CBC: No results for input(s): WBC, NEUTROABS, HGB, HCT, MCV, PLT in the last 168 hours. Basic Metabolic Panel: Recent Labs  Lab 09/23/17 1710 09/24/17 0457  09/24/17 1641 09/25/17 0517 09/26/17 0527  NA 137 136 137 135 138  K 4.7 4.2 4.6 4.1 4.0  CL 98* 98* 95* 93* 93*  CO2 32 30 34* 34* 37*  GLUCOSE 143* 119* 163* 104* 98  BUN 31* 33* 36* 42* 40*  CREATININE 1.13* 1.03* 1.12* 1.15* 1.06*  CALCIUM 8.2* 8.1* 8.2* 8.4* 8.3*  PHOS  --   --   --  4.0 3.7   GFR: Estimated Creatinine Clearance: 80 mL/min (A) (by C-G formula based on SCr of 1.06 mg/dL (H)). Liver Function Tests: Recent Labs  Lab 09/25/17 0517 09/26/17 0527  ALBUMIN 2.9* 2.7*   No results for input(s): LIPASE, AMYLASE in the last 168 hours. No results for input(s): AMMONIA in the last 168 hours. Coagulation Profile: No results for input(s): INR, PROTIME in the last 168 hours. Cardiac Enzymes: No results for input(s): CKTOTAL, CKMB, CKMBINDEX, TROPONINI in the last 168 hours. BNP (last 3 results) No results for input(s): PROBNP in the last 8760 hours. HbA1C: No results for input(s): HGBA1C in the last 72 hours. CBG: Recent Labs  Lab 09/25/17 0900 09/25/17 1137 09/25/17 1652 09/25/17 2113 09/26/17 0803  GLUCAP 107* 204* 161* 176* 99   Lipid Profile: No results for input(s): CHOL, HDL, LDLCALC, TRIG, CHOLHDL, LDLDIRECT in the last 72 hours. Thyroid Function Tests: No results for input(s): TSH, T4TOTAL, FREET4, T3FREE, THYROIDAB in the last 72 hours. Anemia Panel: No results for input(s): VITAMINB12, FOLATE, FERRITIN, TIBC, IRON, RETICCTPCT in the last 72 hours. Urine analysis:    Component Value Date/Time   COLORURINE YELLOW 09/17/2017 1437   APPEARANCEUR HAZY (A) 09/17/2017 1437   LABSPEC 1.015 09/17/2017 1437   PHURINE 6.0 09/17/2017 1437   GLUCOSEU 50 (A) 09/17/2017 1437   HGBUR SMALL (A) 09/17/2017 1437   BILIRUBINUR NEGATIVE 09/17/2017 1437   KETONESUR 5 (A) 09/17/2017 1437   PROTEINUR >=300 (A) 09/17/2017 1437   UROBILINOGEN 1.0 03/03/2014 0942   NITRITE POSITIVE (A) 09/17/2017 1437   LEUKOCYTESUR TRACE (A) 09/17/2017 1437   Sepsis  Labs: @LABRCNTIP (procalcitonin:4,lacticacidven:4)  )No results found for this or any previous visit (from the past 240 hour(s)).       Radiology Studies: No results found.      Scheduled Meds: . atorvastatin  20 mg Oral Daily  . carvedilol  6.25 mg Oral BID WC  . fluticasone  1 spray Each Nare Daily  . folic acid  1 mg Oral Daily  . gabapentin  300 mg Oral TID  . heparin  5,000 Units Subcutaneous Q8H  . insulin aspart  0-9 Units Subcutaneous TID WC  . insulin glargine  20 Units Subcutaneous BID  . mouth rinse  15 mL Mouth Rinse BID  . metolazone  5 mg Oral BID  . phenytoin  200 mg Oral BID  . sodium chloride flush  3 mL Intravenous Q12H   Continuous Infusions: . sodium chloride    . furosemide 160 mg (09/26/17 0916)     LOS: 11 days    Time spent: 30 minutes    Edwin Dada, MD [facility] 09/26/2017, 11:56 AM     Pager [phone] ---  please page though AMION:  [email or web address] Password [email or web address] If 7PM-7AM, please contact night-coverage

## 2017-09-26 NOTE — Plan of Care (Signed)
  Progressing Skin Integrity: Risk for impaired skin integrity will decrease 09/26/2017 2200 - Progressing by Talbert Forest, RN

## 2017-09-26 NOTE — Progress Notes (Signed)
[facility] Progress Note  Subjective:  Much improved UOP past 24 hours with 4.3 liters recorded out (with ^ metolazone) Weight down another 2 kg Feeling better and says abd discomfort better with less edema  She notes has been off MTX for sarcoid since October and it was since that time that she had the subacute/acute worsening of her edema...  Current diuretics metolazone 5 mg BID + furosemide 160 Q6H  Vitals:   09/25/17 1301 09/25/17 1725 09/25/17 2118 09/26/17 0558  BP: 140/60 (!) 152/51 (!) 139/39 (!) 121/45  Pulse: 66 68 65 65  Resp: 18  18 20   Temp: 99 F (37.2 C)  97.8 F (36.6 C) 98 F (36.7 C)  TempSrc: Oral  Oral Oral  SpO2: 99%  100% 100%  Weight:    132.7 kg (292 lb 8 oz)  Height:       Exam: Obese pale app WF Very pleasant and talkative VS as noted including decrease in weight 2 kg/24 hours NAD Wearing O2 Lungs clear Regular. Normal heart sounds. Abd obese, pitting edema lower abd wall, and sacral edema MUCH IMPROVED LE edema to hips, hard and woody posteriorly - decreased past 48 hours 1=2+ to knees better  Inpatient medications: . atorvastatin  20 mg Oral Daily  . carvedilol  6.25 mg Oral BID WC  . fluticasone  1 spray Each Nare Daily  . folic acid  1 mg Oral Daily  . gabapentin  300 mg Oral TID  . heparin  5,000 Units Subcutaneous Q8H  . insulin aspart  0-9 Units Subcutaneous TID WC  . insulin glargine  20 Units Subcutaneous BID  . mouth rinse  15 mL Mouth Rinse BID  . metolazone  5 mg Oral BID  . phenytoin  200 mg Oral BID  . sodium chloride flush  3 mL Intravenous Q12H   . sodium chloride    . furosemide Stopped (09/26/17 0405)   sodium chloride, acetaminophen, albuterol, ALPRAZolam, diphenhydrAMINE, HYDROcodone-acetaminophen, ondansetron (ZOFRAN) IV, promethazine, sodium chloride flush, tiZANidine, zolpidem   UA > 300 prot, 6-30 wbc, 0-5 rbc Urine PCR = 7.9 gm/ 24hr estimated  BUN 23  Cr 0.9  CO2 30 ECHO 09/16/17 > LVEF  60-65%, mod LVH, no diast issues. PA pressure 38 mm peak. IVC was dilated. The respirophasic diameter changes were blunted (< 50%), consistent with elevated central venous pressure     Recent Labs  Lab 09/24/17 1641 09/25/17 0517 09/26/17 0527  NA 137 135 138  K 4.6 4.1 4.0  CL 95* 93* 93*  CO2 34* 34* 37*  GLUCOSE 163* 104* 98  BUN 36* 42* 40*  CREATININE 1.12* 1.15* 1.06*  CALCIUM 8.2* 8.4* 8.3*  PHOS  --  4.0 3.7       Component Value Date/Time   IRON 46 09/16/2017 1530   TIBC 253 09/16/2017 1530   FERRITIN 93 09/16/2017 1530   IRONPCTSAT 18 09/16/2017 1530    Impression: 1. Proteinuria / vol overload/ anasarca - 7 gm by prot:creat ratio. Acute/subacute severe worsening of chronic edema. May be new diast HF and/or new glom disease. Has not had any serologic testing done this admission  (re the ? of glomerulopathy other than DM). I do note had neg ANCA, ANA done 2015. HIV neg this admission. GFR stable.  1. ^'d metolazone to 5 mg BID/same IV lasix 1/28 with improved diuresis and weight declining 2. K holding up, and as expected alkalosis worsening some (and will be limiting factor to this  regimen I suspect) but keep up for now 3. Hold on renal bx for now (though I do think will ultimately need).  4. My current thought is to continue IV diuretics for another day or 2, transition to po torsemide (plus or minus metolazone depending on response) and then follow her up in the office  2. DM, long standing, poor control per pt 3. HTN on coreg, holding home acei w/ diuresis 4. Obesity 5. Hx sarcoidosis - ? Of new lung nodules on CT. Has been off MTX since October (and curiously during that time is when she developed the worsening edema - ? If the MTX was keeping something else in check renal wise in addition to her sarcoid). Plans to revisit going back on the MTX with Dr. Lamonte Sakai.   Jamal Maes, MD [place] Pager 09/26/2017, 6:30 AM

## 2017-09-27 DIAGNOSIS — I5030 Unspecified diastolic (congestive) heart failure: Secondary | ICD-10-CM

## 2017-09-27 LAB — RENAL FUNCTION PANEL
ALBUMIN: 2.9 g/dL — AB (ref 3.5–5.0)
Anion gap: 8 (ref 5–15)
BUN: 44 mg/dL — AB (ref 6–20)
CO2: 38 mmol/L — ABNORMAL HIGH (ref 22–32)
CREATININE: 1.01 mg/dL — AB (ref 0.44–1.00)
Calcium: 8.5 mg/dL — ABNORMAL LOW (ref 8.9–10.3)
Chloride: 90 mmol/L — ABNORMAL LOW (ref 101–111)
GFR calc Af Amer: >60 mL/min (ref >60)
Glucose, Bld: 119 mg/dL — ABNORMAL HIGH (ref 65–99)
PHOSPHORUS: 3.6 mg/dL (ref 2.5–4.6)
Potassium: 3.8 mmol/L (ref 3.5–5.1)
Sodium: 136 mmol/L (ref 135–145)

## 2017-09-27 LAB — GLUCOSE, CAPILLARY
GLUCOSE-CAPILLARY: 109 mg/dL — AB (ref 65–99)
GLUCOSE-CAPILLARY: 224 mg/dL — AB (ref 65–99)
Glucose-Capillary: 171 mg/dL — ABNORMAL HIGH (ref 65–99)
Glucose-Capillary: 120 mg/dL — ABNORMAL HIGH (ref 65–99)

## 2017-09-27 MED ORDER — METOLAZONE 2.5 MG PO TABS
2.5000 mg | ORAL_TABLET | Freq: Two times a day (BID) | ORAL | Status: DC
Start: 2017-09-27 — End: 2017-09-28
  Administered 2017-09-27 – 2017-09-28 (×3): 2.5 mg via ORAL
  Filled 2017-09-27 (×4): qty 1

## 2017-09-27 MED ORDER — POTASSIUM CHLORIDE CRYS ER 20 MEQ PO TBCR
30.0000 meq | EXTENDED_RELEASE_TABLET | Freq: Once | ORAL | Status: AC
  Administered 2017-09-27: 08:00:00 30 meq via ORAL
  Filled 2017-09-27: qty 1

## 2017-09-27 MED ORDER — TORSEMIDE 100 MG PO TABS
100.0000 mg | ORAL_TABLET | Freq: Two times a day (BID) | ORAL | Status: DC
  Administered 2017-09-27 – 2017-09-28 (×3): 100 mg via ORAL
  Filled 2017-09-27 (×4): qty 1

## 2017-09-27 NOTE — Progress Notes (Signed)
[facility] Progress Note  Subjective:  Continued good diuresis Weight down another 2 kg Edema getting better  She noted has been off MTX for sarcoid since October and it was since that time that she had the subacute/acute worsening of her edema...  Current diuretics metolazone 5 mg BID + furosemide 160 Q6H  Vitals:   09/26/17 1341 09/26/17 2112 09/27/17 0455 09/27/17 0500  BP: (!) 156/61 (!) 143/59 (!) 127/42   Pulse: 71 67 65   Resp: 18 18 16    Temp: 98.1 F (36.7 C) 98 F (36.7 C) 98.1 F (36.7 C)   TempSrc: Oral Oral Oral   SpO2: 100% 100% 100%   Weight:    130.7 kg (288 lb 3.2 oz)  Height:       Exam: Very nice obese WF Very pleasant  VS as noted including another decrease in weight 2 kg/24 hours (total now of about 10 kg all told if weights believable) NAD Wearing O2 Lungs clear Regular rhythm. Normal heart sounds. Abd obese, pitting edema lower abd wall, and sacral edema continues to improve LE edema to hips, hard and woody posteriorly further decreased 1-2+ to knees much better  Inpatient medications: . atorvastatin  20 mg Oral Daily  . carvedilol  6.25 mg Oral BID WC  . fluticasone  1 spray Each Nare Daily  . folic acid  1 mg Oral Daily  . gabapentin  300 mg Oral TID  . heparin  5,000 Units Subcutaneous Q8H  . insulin aspart  0-9 Units Subcutaneous TID WC  . insulin glargine  20 Units Subcutaneous BID  . mouth rinse  15 mL Mouth Rinse BID  . metolazone  5 mg Oral BID  . phenytoin  200 mg Oral BID  . sodium chloride flush  3 mL Intravenous Q12H   . sodium chloride    . furosemide Stopped (09/27/17 0453)   sodium chloride, acetaminophen, albuterol, ALPRAZolam, diphenhydrAMINE, HYDROcodone-acetaminophen, ondansetron (ZOFRAN) IV, promethazine, sodium chloride flush, tiZANidine, zolpidem   UA > 300 prot, 6-30 wbc, 0-5 rbc Urine PCR = 7.9 gm/ 24hr estimated  BUN 23  Cr 0.9  CO2 30 ECHO 09/16/17 > LVEF 60-65%, mod LVH, no diast issues. PA  pressure 38 mm peak. IVC was dilated. The respirophasic diameter changes were blunted (< 50%), consistent with elevated central venous pressure    AM labs pending  Recent Labs  Lab 09/25/17 0517 09/26/17 0527 09/27/17 0527  NA 135 138 136  K 4.1 4.0 3.8  CL 93* 93* 90*  CO2 34* 37* 38*  GLUCOSE 104* 98 119*  BUN 42* 40* 44*  CREATININE 1.15* 1.06* 1.01*  CALCIUM 8.4* 8.3* 8.5*  PHOS 4.0 3.7 3.6       Component Value Date/Time   IRON 46 09/16/2017 1530   TIBC 253 09/16/2017 1530   FERRITIN 93 09/16/2017 1530   IRONPCTSAT 18 09/16/2017 1530    Impression: 1. Proteinuria / vol overload/ anasarca - 7 gm by prot:creat ratio. Acute/subacute severe worsening of chronic edema. 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 of glomerulopathy other than DM). I do note had neg ANCA, ANA done 2015. HIV neg this admission. GFR stable.  1. I think OK to transition to a po regimen - will change to po torsemide 100 mg BID, reduce metolazone to 2.5 mg BID 2. If able to at least maintain volume wise then could be discharged in this  regimen, I can follow up in the office for further management of volume and nephrotic syndrome 3. Hold off on renal bx during the current admission 4. Would not resume home lisinopril 5. Will give 1 dose of KDur today 30 mEq 2. DM, long standing, poor control per pt 3. HTN on coreg, holding home acei w/ diuresis 4. Obesity 5. Hx sarcoidosis - ? Of new lung nodules on CT. Has been off MTX since October (and curiously during that time is when she developed the worsening edema - ? If the MTX was keeping something else in check renal wise in addition to her sarcoid). Plans to revisit going back on the MTX with Dr. Lamonte Sakai.   Jamal Maes, MD [facility] [phone] Pager 09/27/2017, 7:01 AM

## 2017-09-27 NOTE — Progress Notes (Signed)
PROGRESS NOTE    Marissa Wilkerson  MPN:[id number] DOB: 07-27-1961 DOA: 09/15/2017 PCP: Jani Gravel, MD  Brief Narrative:57 y.o.year old femalewith medical history significant for pulmonary sarcoidosis, diabetes, and hypertension who presented on 1/19/2019with progressively worsening SOB and generalized swelling and was found to have profound anasarca related to newly diagnosed diastolic CHF exacerbation, though some concerns for nephrotic syndrome given significant proteinuria. Patient initially diuresed with IV Lasix but with inadequate output metolazone was added, dose increased on 1/28.Nephrology consulted given significant proteinuria and have helpedin establishing better diuresis.     Assessment & Plan:   Principal Problem:   CHF (congestive heart failure), NYHA class I, unspecified failure chronicity, diastolic (HCC) Active Problems:   Chronic respiratory failure (HCC)   Hypertension   Diabetes mellitus without complication (HCC)   Diastolic dysfunction   Sarcoidosis of lung (HCC)   Hyperlipidemia   Bilateral carotid artery disease (HCC)   Hyperkalemia   CHF (congestive heart failure) (HCC)  Anasarca Proteinuria AKI Creatinine baseline 0.9, up to 1.3 on admission. Development of anasarca and diuretic failure and nephrotic range proteinuria after stopping methotrexate raises question of intrinsic renal disease. -Continue Lasix and metolazone, changed to p.o. today appreciate renal input. -await dr byrums decision on whether patient needs to restart MTX. Sarcoidosis Chronic respiratory failure Stable on home O2  Diabetes -Continue Lantus -SSI  Hypertension -Continue carvedilol, statin  Other medications -Continue gabapentin, Norco, Xanax, Dilantin      DVT prophylaxisHEPARIN Code Status:FULL Family Communication:NONE Disposition Plan: TBD  Consultants:  RENAL Procedures:NONE Antimicrobials: NONE Subjective: FEELS BETTER  Objective: Vitals:    09/26/17 2112 09/27/17 0455 09/27/17 0500 09/27/17 1236  BP: (!) 143/59 (!) 127/42  135/62  Pulse: 67 65  70  Resp: 18 16  18   Temp: 98 F (36.7 C) 98.1 F (36.7 C)  98.2 F (36.8 C)  TempSrc: Oral Oral  Oral  SpO2: 100% 100%  100%  Weight:   130.7 kg (288 lb 3.2 oz)   Height:        Intake/Output Summary (Last 24 hours) at 09/27/2017 1331 Last data filed at 09/27/2017 1236 Gross per 24 hour  Intake 1621 ml  Output 3600 ml  Net -1979 ml   Filed Weights   09/25/17 0500 09/26/17 0558 09/27/17 0500  Weight: 134.6 kg (296 lb 12.8 oz) 132.7 kg (292 lb 8 oz) 130.7 kg (288 lb 3.2 oz)    Examination:  General exam: Appears calm and comfortable  Respiratory system: Clear to auscultation. Respiratory effort normal. Cardiovascular system: S1 & S2 heard, RRR. No JVD, murmurs, rubs, gallops or clicks. No pedal edema. Gastrointestinal system: Abdomen is nondistended, soft and nontender. No organomegaly or masses felt. Normal bowel sounds heard. Central nervous system: Alert and oriented. No focal neurological deficits. Extremities: Symmetric 5 x 5 power. Skin: No rashes, lesions or ulcers Psychiatry: Judgement and insight appear normal. Mood & affect appropriate.     Data Reviewed: I have personally reviewed following labs and imaging studies  CBC: No results for input(s): WBC, NEUTROABS, HGB, HCT, MCV, PLT in the last 168 hours. Basic Metabolic Panel: Recent Labs  Lab 09/24/17 0457 09/24/17 1641 09/25/17 0517 09/26/17 0527 09/27/17 0527  NA 136 137 135 138 136  K 4.2 4.6 4.1 4.0 3.8  CL 98* 95* 93* 93* 90*  CO2 30 34* 34* 37* 38*  GLUCOSE 119* 163* 104* 98 119*  BUN 33* 36* 42* 40* 44*  CREATININE 1.03* 1.12* 1.15* 1.06* 1.01*  CALCIUM 8.1* 8.2*  8.4* 8.3* 8.5*  PHOS  --   --  4.0 3.7 3.6   GFR: Estimated Creatinine Clearance: 83.2 mL/min (A) (by C-G formula based on SCr of 1.01 mg/dL (H)). Liver Function Tests: Recent Labs  Lab 09/25/17 0517 09/26/17 0527  09/27/17 0527  ALBUMIN 2.9* 2.7* 2.9*   No results for input(s): LIPASE, AMYLASE in the last 168 hours. No results for input(s): AMMONIA in the last 168 hours. Coagulation Profile: No results for input(s): INR, PROTIME in the last 168 hours. Cardiac Enzymes: No results for input(s): CKTOTAL, CKMB, CKMBINDEX, TROPONINI in the last 168 hours. BNP (last 3 results) No results for input(s): PROBNP in the last 8760 hours. HbA1C: No results for input(s): HGBA1C in the last 72 hours. CBG: Recent Labs  Lab 09/26/17 1210 09/26/17 1658 09/26/17 2112 09/27/17 0731 09/27/17 1137  GLUCAP 155* 160* 187* 109* 171*   Lipid Profile: No results for input(s): CHOL, HDL, LDLCALC, TRIG, CHOLHDL, LDLDIRECT in the last 72 hours. Thyroid Function Tests: No results for input(s): TSH, T4TOTAL, FREET4, T3FREE, THYROIDAB in the last 72 hours. Anemia Panel: No results for input(s): VITAMINB12, FOLATE, FERRITIN, TIBC, IRON, RETICCTPCT in the last 72 hours. Sepsis Labs: No results for input(s): PROCALCITON, LATICACIDVEN in the last 168 hours.  No results found for this or any previous visit (from the past 240 hour(s)).       Radiology Studies: No results found.      Scheduled Meds: . atorvastatin  20 mg Oral Daily  . carvedilol  6.25 mg Oral BID WC  . fluticasone  1 spray Each Nare Daily  . folic acid  1 mg Oral Daily  . gabapentin  300 mg Oral TID  . heparin  5,000 Units Subcutaneous Q8H  . insulin aspart  0-9 Units Subcutaneous TID WC  . insulin glargine  20 Units Subcutaneous BID  . mouth rinse  15 mL Mouth Rinse BID  . metolazone  2.5 mg Oral BID  . phenytoin  200 mg Oral BID  . sodium chloride flush  3 mL Intravenous Q12H  . torsemide  100 mg Oral BID   Continuous Infusions: . sodium chloride       LOS: 12 days     Georgette Shell, MD [facility] If 7PM-7AM, please contact night-coverage [email or web address] Password [email or web address] 09/27/2017, 1:31 PM

## 2017-09-27 NOTE — Progress Notes (Addendum)
Patient called RN to room to assess Left great toe. Patient concerned that the toe was red/purple in color and the rest of her toes were not red. Toe is red, blanches, and is warm to touch. Patient denies pain. 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

## 2017-09-27 NOTE — Care Management Important Message (Signed)
Important Message  Patient Details  Name: Marissa Wilkerson MRN: [id number] Date of Birth: December 18, 1960   Medicare Important Message Given:  Yes    Kerin Salen 09/27/2017, 11:53 AMImportant Message  Patient Details  Name: Marissa Wilkerson MRN: [id number] Date of Birth: 1961/03/07   Medicare Important Message Given:  Yes    Kerin Salen 09/27/2017, 11:53 AM

## 2017-09-28 LAB — CBC
HCT: 27.1 % — ABNORMAL LOW (ref 36.0–46.0)
Hemoglobin: 8.5 g/dL — ABNORMAL LOW (ref 12.0–15.0)
MCH: 30.5 pg (ref 26.0–34.0)
MCHC: 31.4 g/dL (ref 30.0–36.0)
MCV: 97.1 fL (ref 78.0–100.0)
PLATELETS: 203 10*3/uL (ref 150–400)
RBC: 2.79 MIL/uL — ABNORMAL LOW (ref 3.87–5.11)
RDW: 14.1 % (ref 11.5–15.5)
WBC: 5.6 10*3/uL (ref 4.0–10.5)

## 2017-09-28 LAB — RENAL FUNCTION PANEL
Albumin: 3 g/dL — ABNORMAL LOW (ref 3.5–5.0)
Anion gap: 9 (ref 5–15)
BUN: 47 mg/dL — ABNORMAL HIGH (ref 6–20)
CHLORIDE: 90 mmol/L — AB (ref 101–111)
CO2: 38 mmol/L — AB (ref 22–32)
CREATININE: 1.06 mg/dL — AB (ref 0.44–1.00)
Calcium: 8.5 mg/dL — ABNORMAL LOW (ref 8.9–10.3)
GFR calc non Af Amer: 58 mL/min — ABNORMAL LOW (ref >60)
Glucose, Bld: 150 mg/dL — ABNORMAL HIGH (ref 65–99)
Phosphorus: 3.3 mg/dL (ref 2.5–4.6)
Potassium: 4.1 mmol/L (ref 3.5–5.1)
Sodium: 137 mmol/L (ref 135–145)

## 2017-09-28 LAB — BASIC METABOLIC PANEL
ANION GAP: 8 (ref 5–15)
BUN: 46 mg/dL — ABNORMAL HIGH (ref 6–20)
CALCIUM: 8.5 mg/dL — AB (ref 8.9–10.3)
CO2: 39 mmol/L — ABNORMAL HIGH (ref 22–32)
Chloride: 90 mmol/L — ABNORMAL LOW (ref 101–111)
Creatinine, Ser: 1.11 mg/dL — ABNORMAL HIGH (ref 0.44–1.00)
GFR, EST NON AFRICAN AMERICAN: 55 mL/min — AB (ref >60)
GLUCOSE: 148 mg/dL — AB (ref 65–99)
Potassium: 4.1 mmol/L (ref 3.5–5.1)
Sodium: 137 mmol/L (ref 135–145)

## 2017-09-28 LAB — GLUCOSE, CAPILLARY: Glucose-Capillary: 132 mg/dL — ABNORMAL HIGH (ref 65–99)

## 2017-09-28 MED ORDER — TORSEMIDE 100 MG PO TABS: 100.0000 mg | ORAL_TABLET | Freq: Two times a day (BID) | ORAL | 1 refills | Status: DC

## 2017-09-28 MED ORDER — METOLAZONE 2.5 MG PO TABS: 2.5000 mg | ORAL_TABLET | Freq: Two times a day (BID) | ORAL | 1 refills | Status: DC

## 2017-09-28 MED ORDER — DARBEPOETIN ALFA 200 MCG/0.4ML IJ SOSY
200.0000 ug | PREFILLED_SYRINGE | Freq: Once | INTRAMUSCULAR | Status: AC
  Administered 2017-09-28: 200 ug via SUBCUTANEOUS
  Filled 2017-09-28: qty 0.4

## 2017-09-28 MED ORDER — GABAPENTIN 300 MG PO CAPS: 600.0000 mg | ORAL_CAPSULE | Freq: Three times a day (TID) | ORAL | 0 refills | Status: DC

## 2017-09-28 MED ORDER — ORAL CARE MOUTH RINSE: 15.0000 mL | Freq: Two times a day (BID) | OROMUCOSAL | 0 refills | Status: DC

## 2017-09-28 MED ORDER — TIZANIDINE HCL 4 MG PO TABS: 4.0000 mg | ORAL_TABLET | Freq: Three times a day (TID) | ORAL | 0 refills | Status: DC | PRN

## 2017-09-28 MED ORDER — INSULIN ASPART 100 UNIT/ML ~~LOC~~ SOLN: 15.0000 [IU] | Freq: Two times a day (BID) | SUBCUTANEOUS | 11 refills | Status: DC

## 2017-09-28 MED ORDER — POTASSIUM CHLORIDE CRYS ER 20 MEQ PO TBCR: 20.0000 meq | EXTENDED_RELEASE_TABLET | Freq: Every day | ORAL | 0 refills | Status: DC

## 2017-09-28 MED ORDER — INSULIN GLARGINE 100 UNIT/ML ~~LOC~~ SOLN: 20.0000 [IU] | Freq: Two times a day (BID) | SUBCUTANEOUS | 1 refills | Status: DC

## 2017-09-28 MED ORDER — METANX 3-90.314-2-35 MG PO CAPS: 1.0000 | ORAL_CAPSULE | Freq: Two times a day (BID) | ORAL | 0 refills | Status: DC

## 2017-09-28 MED ORDER — CARVEDILOL 6.25 MG PO TABS: 6.2500 mg | ORAL_TABLET | Freq: Two times a day (BID) | ORAL | 0 refills | Status: DC

## 2017-09-28 MED ORDER — INSULIN GLARGINE 300 UNIT/ML ~~LOC~~ SOPN: 25.0000 [IU] | PEN_INJECTOR | Freq: Two times a day (BID) | SUBCUTANEOUS | 2 refills | Status: DC

## 2017-09-28 MED ORDER — PHENYTOIN SODIUM EXTENDED 200 MG PO CAPS: 200.0000 mg | ORAL_CAPSULE | Freq: Two times a day (BID) | ORAL | 1 refills | Status: DC

## 2017-09-28 MED ORDER — ALBUTEROL SULFATE HFA 108 (90 BASE) MCG/ACT IN AERS: 2.0000 | INHALATION_SPRAY | Freq: Four times a day (QID) | RESPIRATORY_TRACT | 6 refills | Status: DC | PRN

## 2017-09-28 MED ORDER — PROMETHAZINE HCL 25 MG PO TABS: 25.0000 mg | ORAL_TABLET | ORAL | 0 refills | Status: DC | PRN

## 2017-09-28 MED ORDER — SODIUM CHLORIDE 0.9 % IV SOLN
510.0000 mg | Freq: Once | INTRAVENOUS | Status: AC
  Administered 2017-09-28: 510 mg via INTRAVENOUS
  Filled 2017-09-28: qty 17

## 2017-09-28 MED ORDER — FLUTICASONE PROPIONATE 50 MCG/ACT NA SUSP: 1.0000 | Freq: Every day | NASAL | 2 refills | Status: DC

## 2017-09-28 MED ORDER — HYDROCODONE-ACETAMINOPHEN 7.5-325 MG PO TABS: 1.0000 | ORAL_TABLET | Freq: Four times a day (QID) | ORAL | 0 refills | Status: DC | PRN

## 2017-09-28 MED ORDER — SITAGLIPTIN PHOSPHATE 100 MG PO TABS: 100.0000 mg | ORAL_TABLET | Freq: Every day | ORAL | 0 refills | Status: DC

## 2017-09-28 MED ORDER — ATORVASTATIN CALCIUM 20 MG PO TABS: 20.0000 mg | ORAL_TABLET | Freq: Every day | ORAL | 0 refills | Status: DC

## 2017-09-28 MED FILL — CARVEDILOL 6.25 MG TABLET: 6.25 | 30 days supply | Qty: 60 | Fill #0

## 2017-09-28 NOTE — Progress Notes (Signed)
Patient is stable for discharge. Discharge instructions and medications have been reviewed with the patient and all questions answered. AVS and prescriptions given to patient.  

## 2017-09-28 NOTE — Care Management Note (Signed)
Case Management Note  Patient Details  Name: Marissa Wilkerson MRN: [id number] Date of Birth: [birth year]  Subjective/Objective:  [facility] already aware of d/c home w/[facility]HC-HHPT. Already has home 02. No further CM needs.                  Action/Plan:d/c home w/HHC.   Expected Discharge Date:  09/28/17               Expected Discharge Plan:  [place]  In-House Referral:     Discharge planning Services  CM Consult  Post Acute Care Choice:  (rw,home 02-[facility]) Choice offered to:  Patient  DME Arranged:    DME Agency:     HH Arranged:  PT [place]:  [place]  Status of Service:  Completed, signed off  If discussed at [place] of Stay Meetings, dates discussed:    Additional Comments:  Dessa Phi, RN 09/28/2017, 11:26 AM

## 2017-09-28 NOTE — Discharge Summary (Signed)
Physician Discharge Summary  Marissa Wilkerson TIR:[id number] DOB: November 19, 1960 DOA: 09/15/2017  PCP: Jani Gravel, MD  Admit date: 09/15/2017 Discharge date: 09/28/2017  Admitted From home Disposition: Home Recommendations for Outpatient Follow-up:  1. Follow up with PCP in 1-2 weeks 2. Please obtain BMP/CBC in one week  Home Health yes Equipment/Devices: None Discharge Condition stable CODE STATUS full code Diet recommendation: Cardiac Brief/Interim Summary: 57 y.o.year old femalewith medical history significant for pulmonary sarcoidosis, diabetes, and hypertension who presented on 1/19/2019with progressively worsening SOB and generalized swelling and was found to have profound anasarca related to newly diagnosed diastolic CHF exacerbation, though some concerns for nephrotic syndrome given significant proteinuria. Patient initially diuresed with IV Lasix but with inadequate output metolazone was added, dose increased on 1/28.Nephrology consulted given significant proteinuria and have helpedin establishing better diuresis.     Discharge Diagnoses:  Principal Problem:   CHF (congestive heart failure), NYHA class I, unspecified failure chronicity, diastolic (HCC) Active Problems:   Chronic respiratory failure (HCC)   Hypertension   Diabetes mellitus without complication (HCC)   Diastolic dysfunction   Sarcoidosis of lung (HCC)   Hyperlipidemia   Bilateral carotid artery disease (HCC)   Hyperkalemia   CHF (congestive heart failure) ([place])  1]AKI-patient was admitted with the diagnosis of acute kidney injury and development of anasarca and failure of diuretics at home with nephrotic range proteinuria.  Patient was seen in consult by nephrology Dr. Barton Fanny.  Thought of her renal acute renal failure was thought to be secondary to glomerular disease though no serologic markers were sent during this admission.  Patient was treated with IV diuretics and then was changed 200 mg twice  a day of Demadex along with Zaroxolyn 2.5 mg twice a day.  Her ACE inhibitor dose was on hold while she is on this therapy.  She will follow-up with Dr. Lorrene Reid on 10/05/2017 at 9 AM.  Her baseline creatinine is 0.9 and on the day of discharge her creatinine is 1.06.  2] long-standing poorly controlled diabetes will discharge her on Januvia and long-acting insulin.  Her home medications will be resumed.  3] hypertension continue Coreg.  4] history of sarcoidosis with new lung nodules by CT scan-patient followed by Dr. Lamonte Sakai.  Methotrexate was stopped not too long ago.  She will follow-up with Dr. Lamonte Sakai as an outpatient.  5] chronic anemia patient received a dose of Feraheme and adenoids on the day of discharge.  6] left foot ingrown toenail patient will need to follow-up with the podiatrist as an outpatient.  I have told her to soak her feet in Epsom salt water twice a day.  She will need the nail trimmed.  Discharge Instructions follow-up with the podiatrist for ingrown toenail, Dr. Lamonte Sakai, Dr. Lorrene Reid, and PCP.  Discharge Instructions    Diet - low sodium heart healthy   Complete by:  As directed    Increase activity slowly   Complete by:  As directed      Allergies as of 09/28/2017      Reactions   Cymbalta [duloxetine Hcl] Other (See Comments)   Hallucinations    Fentanyl Other (See Comments)   Patches. Blistered skin.    Metformin And Related Nausea And Vomiting   Mobic [meloxicam] Nausea And Vomiting   Sulfa Antibiotics Rash   Voltaren [diclofenac] Nausea And Vomiting      Medication List    STOP taking these medications   diphenhydrAMINE 25 mg capsule Commonly known as:  BENADRYL  furosemide 40 MG tablet Commonly known as:  LASIX   lisinopril 10 MG tablet Commonly known as:  PRINIVIL,ZESTRIL   methotrexate 2.5 MG tablet Commonly known as:  RHEUMATREX     TAKE these medications   albuterol 108 (90 Base) MCG/ACT inhaler Commonly known as:  PROVENTIL HFA;VENTOLIN  HFA Inhale 2 puffs into the lungs every 6 (six) hours as needed for wheezing or shortness of breath.   atorvastatin 20 MG tablet Commonly known as:  LIPITOR Take 20 mg by mouth daily.   carvedilol 6.25 MG tablet Commonly known as:  COREG Take 1 tablet (6.25 mg total) by mouth 2 (two) times daily with a meal.   fluticasone 50 MCG/ACT nasal spray Commonly known as:  FLONASE Place 1 spray into both nostrils daily. Start taking on:  10/06/5186   folic acid 1 MG tablet Commonly known as:  FOLVITE Take 1 tablet (1 mg total) by mouth daily.   gabapentin 300 MG capsule Commonly known as:  NEURONTIN Take 600 mg by mouth 3 (three) times daily. Take 300mg  in AM and 600mg  in PM   HYDROcodone-acetaminophen 7.5-325 MG tablet Commonly known as:  NORCO Take 1 tablet by mouth every 6 (six) hours as needed for moderate pain.   insulin aspart 100 UNIT/ML injection Commonly known as:  novoLOG Inject 15-25 Units into the skin 2 (two) times daily before a meal. Sliding scale   METANX 3-90.314-2-35 MG Caps Take 1 capsule by mouth 2 (two) times daily.   metolazone 2.5 MG tablet Commonly known as:  ZAROXOLYN Take 1 tablet (2.5 mg total) by mouth 2 (two) times daily.   mouth rinse Liqd solution 15 mLs by Mouth Rinse route 2 (two) times daily.   phenytoin 200 MG ER capsule Commonly known as:  DILANTIN Take 200 mg by mouth 2 (two) times daily.   potassium chloride SA 20 MEQ tablet Commonly known as:  K-DUR,KLOR-CON Take 20 mEq by mouth daily.   promethazine 25 MG tablet Commonly known as:  PHENERGAN Take 25 mg by mouth every 4 (four) hours as needed for nausea or vomiting.   sitaGLIPtin 100 MG tablet Commonly known as:  JANUVIA Take 100 mg by mouth daily.   tiZANidine 4 MG tablet Commonly known as:  ZANAFLEX Take 4 mg by mouth every 8 (eight) hours as needed for muscle spasms.   torsemide 100 MG tablet Commonly known as:  DEMADEX Take 1 tablet (100 mg total) by mouth 2 (two) times  daily.   TOUJEO SOLOSTAR 300 UNIT/ML Sopn Generic drug:  Insulin Glargine Inject 25 Units into the skin 2 (two) times daily.   insulin glargine 100 UNIT/ML injection Commonly known as:  LANTUS Inject 0.2 mLs (20 Units total) into the skin 2 (two) times daily.      Follow-up Information    [facility] Follow up.   Specialty:  [place] Why:  [facility] physical therapy Contact information: [address] [place] [address] [phone]        Collene Gobble, MD. Schedule an appointment as soon as possible for a visit in 2 week(s).   Specialty:  Pulmonary Disease Why:  Make appointment to follow up in 2-3 weeks Contact information: [place] [place] [address] [phone]        Jamal Maes, MD Follow up.   Specialty:  Nephrology Why:  HAS APPOINTMENT 10/05/17 AT 9 AM Contact information: [address] [place] [address] [phone]  Jani Gravel, MD Follow up.   Specialty:  Internal Medicine Contact information: [place] [address] [phone]          Allergies  Allergen Reactions  . Cymbalta [Duloxetine Hcl] Other (See Comments)    Hallucinations   . Fentanyl Other (See Comments)    Patches. Blistered skin.   . Metformin And Related Nausea And Vomiting  . Mobic [Meloxicam] Nausea And Vomiting  . Sulfa Antibiotics Rash  . Voltaren [Diclofenac] Nausea And Vomiting    Consultations:  Nephrology Dr. Lorrene Reid   Procedures/Studies: Dg Chest 2 View  Result Date: 09/15/2017 CLINICAL DATA:  Shortness of breath EXAM: CHEST  2 VIEW COMPARISON:  Chest CT 06/13/2017 FINDINGS: Cardiomegaly and vascular pedicle widening that is chronic. Congested appearance of vessels with superimposed mild streaky density. Trace pleural effusions. No pneumothorax. IMPRESSION: 1. Cardiomegaly with vascular congestion and trace effusions. 2. History of sarcoidosis with mild scarring.  Electronically Signed   By: Monte Fantasia M.D.   On: 09/15/2017 18:41   Ct Abdomen Pelvis W Contrast  Result Date: 09/15/2017 CLINICAL DATA:  Abdominal distension with pain for 2 weeks. EXAM: CT ABDOMEN AND PELVIS WITH CONTRAST TECHNIQUE: Multidetector CT imaging of the abdomen and pelvis was performed using the standard protocol following bolus administration of intravenous contrast. CONTRAST:  1mL ISOVUE-300 IOPAMIDOL (ISOVUE-300) INJECTION 61% COMPARISON:  Chest CT June 13, 2017 FINDINGS: Lower chest: There is nodularity in the lung bases. While a few tiny nodules in the left base are stable. There are a few new nodules in the right base including 11 mm nodule on series 6, image 3, a new nodule measuring 13 mm on series 6, image 18, and a new nodule measuring 12 mm on series 6, image 31. A mosaic pattern of lung attenuation is identified and similar in the interval. Small pleural effusions are seen. There is skin thickening associated with the left breast there is increased attenuation in the subcutaneous fat of the abdomen, particularly in the left flank as seen on series 2, image 12. This extends through the level the pelvis. Hepatobiliary: The patient is status post cholecystectomy. No masses are seen in the liver. The portal vein is patent. The portal vein remains somewhat dilated but similar in caliber since the previous study, likely due to previous cholecystectomy. Pancreas: Unremarkable. No pancreatic ductal dilatation or surrounding inflammatory changes. Spleen: Normal in size without focal abnormality. Adrenals/Urinary Tract: Left adrenal nodularity is stable since October 2018, possibly small adenomas a nonspecific. No renal stones or hydronephrosis. No ureterectasis or ureteral stones. The bladder is normal. Stomach/Bowel: The stomach is normal in appearance as is the small bowel. The colon and appendix are normal. Vascular/Lymphatic: Mild atherosclerotic changes are seen in the abdominal  aorta without aneurysm or dissection. Adenopathy is identified in the porta hepatis such as a node on series 2, image 28 measuring 17 mm. This node measured 18 mm on the comparison study. The nodes in the porta hepatis are essentially stable. There are prominent retroperitoneal nodes which are also stable within visualize limits. Reproductive: Uterus and bilateral adnexa are unremarkable. Other: There is increased attenuation in the fat of the abdomen as well as a small amount of fluid which may be due to volume overload as associated with the subcutaneous edema and small pleural effusions. Musculoskeletal: No acute or significant osseous findings. IMPRESSION: 1. There is nodularity in the lung bases. The nodularity in the left base is stable. Several new nodules are seen in  the right base. It is possible these nodules are due to the patient's known sarcoidosis but an infectious or neoplastic process is not excluded on this single study. Recommend attention on short-term follow-up. 2. Diffuse subcutaneous edema. Small pleural effusions. There is a tiny amount of ascites and increased attenuation in the fat of the abdomen and pelvis. The findings suggest volume overload. 3. Skin thickening associated with the left breast. The subcutaneous edema is worse on the left than the right and I suspect the breast finding is secondary to the diffuse process of edema. Recommend close clinical follow-up. If the left breast skin thickening persists, recommend consultation in the breast Center. 4. Mild atherosclerotic change in the abdominal aorta. 5. Adenopathy in the abdomen is stable within visualized limits since October 2018 and may be either reactive or secondary to the patient's known sarcoidosis. Electronically Signed   By: Dorise Bullion III M.D   On: 09/15/2017 21:26   US Renal  Result Date: 09/20/2017 CLINICAL DATA:  Renal failure EXAM: RENAL / URINARY TRACT ULTRASOUND COMPLETE COMPARISON:  None. FINDINGS: Right  Kidney: Length: 14.6 cm. Echogenicity within normal limits. No mass or hydronephrosis visualized. Left Kidney: Length: 13.3 cm. Echogenicity within normal limits. No mass or hydronephrosis visualized. Bladder: Decompressed IMPRESSION: No acute renal abnormality is noted. Electronically Signed   By: Inez Catalina M.D.   On: 09/20/2017 19:12    (Echo, Carotid, EGD, Colonoscopy, ERCP)    Subjective:   Discharge Exam: Vitals:   09/27/17 2210 09/28/17 0518  BP: (!) 159/51 (!) 141/67  Pulse: 70 67  Resp: 18 16  Temp: 98.4 F (36.9 C) 97.7 F (36.5 C)  SpO2: 99% 98%   Vitals:   09/27/17 1236 09/27/17 2210 09/28/17 0517 09/28/17 0518  BP: 135/62 (!) 159/51  (!) 141/67  Pulse: 70 70  67  Resp: 18 18  16   Temp: 98.2 F (36.8 C) 98.4 F (36.9 C)  97.7 F (36.5 C)  TempSrc: Oral Oral  Oral  SpO2: 100% 99%  98%  Weight:   130 kg (286 lb 8 oz)   Height:        General: Pt is alert, awake, not in acute distress Cardiovascular: RRR, S1/S2 +, no rubs, no gallops Respiratory: CTA bilaterally, no wheezing, no rhonchi Abdominal: Soft, NT, ND, bowel sounds + Extremities: no edema, no cyanosis    The results of significant diagnostics from this hospitalization (including imaging, microbiology, ancillary and laboratory) are listed below for reference.     Microbiology: No results found for this or any previous visit (from the past 240 hour(s)).   Labs: BNP (last 3 results) Recent Labs    09/15/17 1844  BNP 094.7*   Basic Metabolic Panel: Recent Labs  Lab 09/24/17 1641 09/25/17 0517 09/26/17 0527 09/27/17 0527 09/28/17 0506  NA 137 135 138 136 137  137  K 4.6 4.1 4.0 3.8 4.1  4.1  CL 95* 93* 93* 90* 90*  90*  CO2 34* 34* 37* 38* 38*  39*  GLUCOSE 163* 104* 98 119* 150*  148*  BUN 36* 42* 40* 44* 47*  46*  CREATININE 1.12* 1.15* 1.06* 1.01* 1.06*  1.11*  CALCIUM 8.2* 8.4* 8.3* 8.5* 8.5*  8.5*  PHOS  --  4.0 3.7 3.6 3.3   Liver Function Tests: Recent Labs  Lab  09/25/17 0517 09/26/17 0527 09/27/17 0527 09/28/17 0506  ALBUMIN 2.9* 2.7* 2.9* 3.0*   No results for input(s): LIPASE, AMYLASE in the last 168 hours. No  results for input(s): AMMONIA in the last 168 hours. CBC: Recent Labs  Lab 09/28/17 0506  WBC 5.6  HGB 8.5*  HCT 27.1*  MCV 97.1  PLT 203   Cardiac Enzymes: No results for input(s): CKTOTAL, CKMB, CKMBINDEX, TROPONINI in the last 168 hours. BNP: Invalid input(s): POCBNP CBG: Recent Labs  Lab 09/27/17 0731 09/27/17 1137 09/27/17 1648 09/27/17 2134 09/28/17 0718  GLUCAP 109* 171* 120* 224* 132*   D-Dimer No results for input(s): DDIMER in the last 72 hours. Hgb A1c No results for input(s): HGBA1C in the last 72 hours. Lipid Profile No results for input(s): CHOL, HDL, LDLCALC, TRIG, CHOLHDL, LDLDIRECT in the last 72 hours. Thyroid function studies No results for input(s): TSH, T4TOTAL, T3FREE, THYROIDAB in the last 72 hours.  Invalid input(s): FREET3 Anemia work up No results for input(s): VITAMINB12, FOLATE, FERRITIN, TIBC, IRON, RETICCTPCT in the last 72 hours. Urinalysis    Component Value Date/Time   COLORURINE YELLOW 09/17/2017 1437   APPEARANCEUR HAZY (A) 09/17/2017 1437   LABSPEC 1.015 09/17/2017 1437   PHURINE 6.0 09/17/2017 1437   GLUCOSEU 50 (A) 09/17/2017 1437   HGBUR SMALL (A) 09/17/2017 1437   BILIRUBINUR NEGATIVE 09/17/2017 1437   KETONESUR 5 (A) 09/17/2017 1437   PROTEINUR >=300 (A) 09/17/2017 1437   UROBILINOGEN 1.0 03/03/2014 0942   NITRITE POSITIVE (A) 09/17/2017 1437   LEUKOCYTESUR TRACE (A) 09/17/2017 1437   Sepsis Labs Invalid input(s): PROCALCITONIN,  WBC,  LACTICIDVEN Microbiology No results found for this or any previous visit (from the past 240 hour(s)).   Time coordinating discharge: Over 30 minutes  SIGNED:   Georgette Shell, MD  [facility] 09/28/2017, 10:50 AM Pager   If 7PM-7AM, please contact night-coverage [email or web address] Password [email or web address]

## 2017-09-28 NOTE — Progress Notes (Signed)
[facility] Progress Note  Subjective:  Weight stable at 130 kg Changed to po regimen yesterday with not quite as effect diuresis (2.5 liters out/24 hours) Current diuretics metolazone 2.5  mg BID + torsemide 100 mg BID Says feels much better, able to get up and around  Vitals:   09/27/17 1236 09/27/17 2210 09/28/17 0517 09/28/17 0518  BP: 135/62 (!) 159/51  (!) 141/67  Pulse: 70 70  67  Resp: 18 18  16   Temp: 98.2 F (36.8 C) 98.4 F (36.9 C)  97.7 F (36.5 C)  TempSrc: Oral Oral  Oral  SpO2: 100% 99%  98%  Weight:   130 kg (286 lb 8 oz)   Height:        Intake/Output Summary (Last 24 hours) at 09/28/2017 0824 Last data filed at 09/28/2017 8119 Gross per 24 hour  Intake 1164 ml  Output 2525 ml  Net -1361 ml   Exam: Very nice obese WF VS as noted  Weight 130 kg NAD Wearing O2 Lungs clear Regular rhythm. Normal heart sounds. Abd obese, pitting edema lower abd wall, and sacral edema continues to improve LE edema to hips, hard and woody posteriorly further decreased 1-2+ to knees much better  Inpatient medications: . atorvastatin  20 mg Oral Daily  . carvedilol  6.25 mg Oral BID WC  . fluticasone  1 spray Each Nare Daily  . folic acid  1 mg Oral Daily  . gabapentin  300 mg Oral TID  . heparin  5,000 Units Subcutaneous Q8H  . insulin aspart  0-9 Units Subcutaneous TID WC  . insulin glargine  20 Units Subcutaneous BID  . mouth rinse  15 mL Mouth Rinse BID  . metolazone  2.5 mg Oral BID  . phenytoin  200 mg Oral BID  . sodium chloride flush  3 mL Intravenous Q12H  . torsemide  100 mg Oral BID   . sodium chloride     sodium chloride, acetaminophen, albuterol, ALPRAZolam, diphenhydrAMINE, HYDROcodone-acetaminophen, ondansetron (ZOFRAN) IV, promethazine, sodium chloride flush, tiZANidine, zolpidem   UA > 300 prot, 6-30 wbc, 0-5 rbc Urine PCR = 7.9 gm/ 24hr estimated  BUN 23  Cr 0.9  CO2 30 ECHO 09/16/17 > LVEF 60-65%, mod LVH, no diast issues. PA  pressure 38 mm peak. IVC was dilated. The respirophasic diameter changes were blunted (< 50%), consistent with elevated central venous pressure     Recent Labs  Lab 09/26/17 0527 09/27/17 0527 09/28/17 0506  NA 138 136 137  137  K 4.0 3.8 4.1  4.1  CL 93* 90* 90*  90*  CO2 37* 38* 38*  39*  GLUCOSE 98 119* 150*  148*  BUN 40* 44* 47*  46*  CREATININE 1.06* 1.01* 1.06*  1.11*  CALCIUM 8.3* 8.5* 8.5*  8.5*  PHOS 3.7 3.6 3.3       Component Value Date/Time   IRON 46 09/16/2017 1530   TIBC 253 09/16/2017 1530   FERRITIN 93 09/16/2017 1530   IRONPCTSAT 18 09/16/2017 1530    Impression: 1. Proteinuria / vol overload/ anasarca - 7 gm by prot:creat ratio. Acute/subacute severe worsening of chronic edema. 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 of glomerulopathy other than DM). I do note had neg ANCA, ANA done 2015. HIV neg this admission. GFR stable.  1. Has maintained so far on po regimen of torsemide 100 BID and metolazone 2.5 BID. 1  dose KDur 30 yesterday K fine 2. OK with me to discharge on this regimen 3. She will weigh herself daily at home 4. I will see in follow up at [place] on Friday 10/05/17 at 9AM 2. DM, long standing, poor control per pt 3. HTN on coreg, holding home acei w/ diuresis DO NOT RESUME AT DISCHARGE 4. Obesity 5. Hx sarcoidosis - ? Of new lung nodules on CT. Has been off MTX since October (and curiously during that time is when she developed the worsening edema - ? If the MTX was keeping something else in check renal wise in addition to her sarcoid). Plans to revisit going back on the MTX with Dr. Lamonte Sakai.  6. Anemia - Prohibitive to restarting MTX. Fe low. Dose of Feraheme and Aranesp 200 today. Recheck CBC when I see in the office next week.  Jamal Maes, MD [facility] [phone] Pager 09/28/2017, 8:23 AM

## 2017-10-01 ENCOUNTER — Ambulatory Visit: Payer: BLUE CROSS/BLUE SHIELD | Admitting: Neurology

## 2017-10-01 LAB — GLUCOSE, CAPILLARY: Glucose-Capillary: 187 mg/dL — ABNORMAL HIGH (ref 65–99)

## 2017-10-01 MED FILL — tiZANidine HCL 4 MG TABS: 4 | 90 days supply | Qty: 270 | Fill #0

## 2017-10-02 MED FILL — PHENYTOIN SOD EXT 200 MG CA: 200 | 90 days supply | Qty: 180 | Fill #1

## 2017-10-03 MED FILL — UNIFINE PENTIPS 8MM 31G: 31G X 8 MM | 16 days supply | Qty: 100 | Fill #1

## 2017-10-12 ENCOUNTER — Inpatient Hospital Stay: Payer: Medicare Other | Admitting: Adult Health

## 2017-10-16 ENCOUNTER — Inpatient Hospital Stay (HOSPITAL_COMMUNITY): Payer: Medicare Other

## 2017-10-16 ENCOUNTER — Emergency Department (HOSPITAL_COMMUNITY): Payer: Medicare Other

## 2017-10-16 ENCOUNTER — Inpatient Hospital Stay (HOSPITAL_COMMUNITY)
Admission: EM | Admit: 2017-10-16 | Discharge: 2017-10-19 | DRG: 683 | Disposition: A | Discharge status: Home / Self Care | Payer: Medicare Other | Attending: Internal Medicine | Admitting: Internal Medicine

## 2017-10-16 ENCOUNTER — Encounter (HOSPITAL_COMMUNITY): Payer: Self-pay | Admitting: Emergency Medicine

## 2017-10-16 ENCOUNTER — Other Ambulatory Visit: Payer: Self-pay

## 2017-10-16 DIAGNOSIS — E876 Hypokalemia: Secondary | ICD-10-CM | POA: Diagnosis present

## 2017-10-16 DIAGNOSIS — E860 Dehydration: Secondary | ICD-10-CM | POA: Diagnosis present

## 2017-10-16 DIAGNOSIS — R079 Chest pain, unspecified: Secondary | ICD-10-CM | POA: Diagnosis present

## 2017-10-16 DIAGNOSIS — Z882 Allergy status to sulfonamides status: Secondary | ICD-10-CM

## 2017-10-16 DIAGNOSIS — R197 Diarrhea, unspecified: Secondary | ICD-10-CM

## 2017-10-16 DIAGNOSIS — E785 Hyperlipidemia, unspecified: Secondary | ICD-10-CM | POA: Diagnosis present

## 2017-10-16 DIAGNOSIS — D638 Anemia in other chronic diseases classified elsewhere: Secondary | ICD-10-CM | POA: Diagnosis present

## 2017-10-16 DIAGNOSIS — Z886 Allergy status to analgesic agent status: Secondary | ICD-10-CM

## 2017-10-16 DIAGNOSIS — Z794 Long term (current) use of insulin: Secondary | ICD-10-CM

## 2017-10-16 DIAGNOSIS — K219 Gastro-esophageal reflux disease without esophagitis: Secondary | ICD-10-CM | POA: Diagnosis present

## 2017-10-16 DIAGNOSIS — R0789 Other chest pain: Secondary | ICD-10-CM

## 2017-10-16 DIAGNOSIS — I5032 Chronic diastolic (congestive) heart failure: Secondary | ICD-10-CM | POA: Diagnosis present

## 2017-10-16 DIAGNOSIS — Z8249 Family history of ischemic heart disease and other diseases of the circulatory system: Secondary | ICD-10-CM

## 2017-10-16 DIAGNOSIS — M1990 Unspecified osteoarthritis, unspecified site: Secondary | ICD-10-CM | POA: Diagnosis present

## 2017-10-16 DIAGNOSIS — E118 Type 2 diabetes mellitus with unspecified complications: Secondary | ICD-10-CM | POA: Diagnosis not present

## 2017-10-16 DIAGNOSIS — R112 Nausea with vomiting, unspecified: Secondary | ICD-10-CM

## 2017-10-16 DIAGNOSIS — Z888 Allergy status to other drugs, medicaments and biological substances status: Secondary | ICD-10-CM

## 2017-10-16 DIAGNOSIS — Z7951 Long term (current) use of inhaled steroids: Secondary | ICD-10-CM | POA: Diagnosis not present

## 2017-10-16 DIAGNOSIS — I10 Essential (primary) hypertension: Secondary | ICD-10-CM | POA: Diagnosis not present

## 2017-10-16 DIAGNOSIS — Z87891 Personal history of nicotine dependence: Secondary | ICD-10-CM | POA: Diagnosis not present

## 2017-10-16 DIAGNOSIS — D860 Sarcoidosis of lung: Secondary | ICD-10-CM | POA: Diagnosis present

## 2017-10-16 DIAGNOSIS — Z833 Family history of diabetes mellitus: Secondary | ICD-10-CM | POA: Diagnosis not present

## 2017-10-16 DIAGNOSIS — Z9049 Acquired absence of other specified parts of digestive tract: Secondary | ICD-10-CM | POA: Diagnosis not present

## 2017-10-16 DIAGNOSIS — I5189 Other ill-defined heart diseases: Secondary | ICD-10-CM | POA: Diagnosis present

## 2017-10-16 DIAGNOSIS — Z9981 Dependence on supplemental oxygen: Secondary | ICD-10-CM | POA: Diagnosis not present

## 2017-10-16 DIAGNOSIS — E119 Type 2 diabetes mellitus without complications: Secondary | ICD-10-CM | POA: Diagnosis present

## 2017-10-16 DIAGNOSIS — I110 Hypertensive heart disease with heart failure: Secondary | ICD-10-CM | POA: Diagnosis present

## 2017-10-16 DIAGNOSIS — N179 Acute kidney failure, unspecified: Principal | ICD-10-CM | POA: Diagnosis present

## 2017-10-16 LAB — CBC
HCT: 35 % — ABNORMAL LOW (ref 36.0–46.0)
HCT: 36.3 % (ref 36.0–46.0)
HEMOGLOBIN: 11.7 g/dL — AB (ref 12.0–15.0)
Hemoglobin: 11.8 g/dL — ABNORMAL LOW (ref 12.0–15.0)
MCH: 31.2 pg (ref 26.0–34.0)
MCH: 30.4 pg (ref 26.0–34.0)
MCHC: 33.4 g/dL (ref 30.0–36.0)
MCHC: 32.5 g/dL (ref 30.0–36.0)
MCV: 93.3 fL (ref 78.0–100.0)
MCV: 93.6 fL (ref 78.0–100.0)
PLATELETS: 233 10*3/uL (ref 150–400)
Platelets: 225 10*3/uL (ref 150–400)
RBC: 3.75 MIL/uL — AB (ref 3.87–5.11)
RBC: 3.88 MIL/uL (ref 3.87–5.11)
RDW: 15.5 % (ref 11.5–15.5)
RDW: 15.5 % (ref 11.5–15.5)
WBC: 7.5 10*3/uL (ref 4.0–10.5)
WBC: 7.5 10*3/uL (ref 4.0–10.5)

## 2017-10-16 LAB — GLUCOSE, CAPILLARY: GLUCOSE-CAPILLARY: 148 mg/dL — AB (ref 65–99)

## 2017-10-16 LAB — BASIC METABOLIC PANEL
Anion gap: 18 — ABNORMAL HIGH (ref 5–15)
BUN: 94 mg/dL — ABNORMAL HIGH (ref 6–20)
CHLORIDE: 90 mmol/L — AB (ref 101–111)
CO2: 27 mmol/L (ref 22–32)
Calcium: 9.1 mg/dL (ref 8.9–10.3)
Creatinine, Ser: 2.41 mg/dL — ABNORMAL HIGH (ref 0.44–1.00)
GFR calc non Af Amer: 21 mL/min — ABNORMAL LOW (ref >60)
GFR, EST AFRICAN AMERICAN: 25 mL/min — AB (ref >60)
GLUCOSE: 249 mg/dL — AB (ref 65–99)
Potassium: 3.1 mmol/L — ABNORMAL LOW (ref 3.5–5.1)
Sodium: 135 mmol/L (ref 135–145)

## 2017-10-16 LAB — CREATININE, SERUM
Creatinine, Ser: 2.42 mg/dL — ABNORMAL HIGH (ref 0.44–1.00)
GFR calc Af Amer: 25 mL/min — ABNORMAL LOW (ref >60)
GFR calc non Af Amer: 21 mL/min — ABNORMAL LOW (ref >60)

## 2017-10-16 LAB — HEPATIC FUNCTION PANEL
ALT: 19 U/L (ref 14–54)
AST: 24 U/L (ref 15–41)
Albumin: 3.6 g/dL (ref 3.5–5.0)
Alkaline Phosphatase: 107 U/L (ref 38–126)
Bilirubin, Direct: 0.1 mg/dL (ref 0.1–0.5)
Indirect Bilirubin: 0.5 mg/dL (ref 0.3–0.9)
Total Bilirubin: 0.6 mg/dL (ref 0.3–1.2)
Total Protein: 8.3 g/dL — ABNORMAL HIGH (ref 6.5–8.1)

## 2017-10-16 LAB — URINALYSIS, ROUTINE W REFLEX MICROSCOPIC
BILIRUBIN URINE: NEGATIVE
GLUCOSE, UA: 150 mg/dL — AB
Ketones, ur: 20 mg/dL — AB
NITRITE: NEGATIVE
PH: 5 (ref 5.0–8.0)
Protein, ur: >=300 mg/dL — AB
Specific Gravity, Urine: 1.018 (ref 1.005–1.030)

## 2017-10-16 LAB — I-STAT BETA HCG BLOOD, ED (MC, WL, AP ONLY): I-stat hCG, quantitative: <5 m[IU]/mL (ref <5)

## 2017-10-16 LAB — TROPONIN I: Troponin I: 0.03 ng/mL (ref <0.03)

## 2017-10-16 LAB — LIPASE, BLOOD: Lipase: 38 U/L (ref 11–51)

## 2017-10-16 LAB — BRAIN NATRIURETIC PEPTIDE: B Natriuretic Peptide: 11 pg/mL (ref 0.0–100.0)

## 2017-10-16 LAB — I-STAT TROPONIN, ED
Troponin i, poc: 0.02 ng/mL (ref 0.00–0.08)
Troponin i, poc: 0.02 ng/mL (ref 0.00–0.08)

## 2017-10-16 LAB — CBG MONITORING, ED: GLUCOSE-CAPILLARY: 183 mg/dL — AB (ref 65–99)

## 2017-10-16 MED ORDER — FLUTICASONE PROPIONATE 50 MCG/ACT NA SUSP
1.0000 | Freq: Every day | NASAL | Status: DC
  Administered 2017-10-16 – 2017-10-19 (×4): 1 via NASAL
  Filled 2017-10-16: qty 16

## 2017-10-16 MED ORDER — HEPARIN SODIUM (PORCINE) 5000 UNIT/ML IJ SOLN
5000.0000 [IU] | Freq: Three times a day (TID) | INTRAMUSCULAR | Status: DC
  Administered 2017-10-16 – 2017-10-19 (×9): 5000 [IU] via SUBCUTANEOUS
  Filled 2017-10-16 (×10): qty 1

## 2017-10-16 MED ORDER — BISACODYL 5 MG PO TBEC: 5.0000 mg | DELAYED_RELEASE_TABLET | Freq: Every day | ORAL | Status: DC | PRN

## 2017-10-16 MED ORDER — PROMETHAZINE HCL 25 MG PO TABS
25.0000 mg | ORAL_TABLET | ORAL | Status: DC | PRN
  Administered 2017-10-16 – 2017-10-17 (×2): 25 mg via ORAL
  Filled 2017-10-16 (×3): qty 1

## 2017-10-16 MED ORDER — ONDANSETRON HCL 4 MG/2ML IJ SOLN
4.0000 mg | Freq: Once | INTRAMUSCULAR | Status: AC
  Administered 2017-10-16: 4 mg via INTRAVENOUS
  Filled 2017-10-16: qty 2

## 2017-10-16 MED ORDER — ASPIRIN 325 MG PO TABS
325.0000 mg | ORAL_TABLET | Freq: Every day | ORAL | Status: DC
  Administered 2017-10-16 – 2017-10-18 (×3): 325 mg via ORAL
  Filled 2017-10-16 (×2): qty 1

## 2017-10-16 MED ORDER — PHENYTOIN SODIUM EXTENDED 100 MG PO CAPS
200.0000 mg | ORAL_CAPSULE | Freq: Two times a day (BID) | ORAL | Status: DC
  Administered 2017-10-16 – 2017-10-19 (×6): 200 mg via ORAL
  Filled 2017-10-16 (×6): qty 2

## 2017-10-16 MED ORDER — SODIUM CHLORIDE 0.9 % IV SOLN: INTRAVENOUS | Status: DC

## 2017-10-16 MED ORDER — CARVEDILOL 6.25 MG PO TABS
6.2500 mg | ORAL_TABLET | Freq: Two times a day (BID) | ORAL | Status: DC
  Administered 2017-10-16 – 2017-10-19 (×5): 6.25 mg via ORAL
  Filled 2017-10-16 (×7): qty 1

## 2017-10-16 MED ORDER — ACETAMINOPHEN 325 MG PO TABS: 650.0000 mg | ORAL_TABLET | Freq: Four times a day (QID) | ORAL | Status: DC | PRN

## 2017-10-16 MED ORDER — HYDROCODONE-ACETAMINOPHEN 7.5-325 MG PO TABS: 1.0000 | ORAL_TABLET | Freq: Four times a day (QID) | ORAL | Status: DC | PRN

## 2017-10-16 MED ORDER — LINAGLIPTIN 5 MG PO TABS
5.0000 mg | ORAL_TABLET | Freq: Every day | ORAL | Status: DC
  Administered 2017-10-16 – 2017-10-19 (×4): 5 mg via ORAL
  Filled 2017-10-16 (×4): qty 1

## 2017-10-16 MED ORDER — HYDROCODONE-ACETAMINOPHEN 5-325 MG PO TABS
1.0000 | ORAL_TABLET | ORAL | Status: DC | PRN
  Administered 2017-10-16 – 2017-10-18 (×6): 2 via ORAL
  Filled 2017-10-16 (×6): qty 2

## 2017-10-16 MED ORDER — POTASSIUM CHLORIDE CRYS ER 20 MEQ PO TBCR
20.0000 meq | EXTENDED_RELEASE_TABLET | Freq: Every day | ORAL | Status: DC
  Administered 2017-10-17 – 2017-10-19 (×3): 20 meq via ORAL
  Filled 2017-10-16 (×3): qty 1

## 2017-10-16 MED ORDER — ALBUTEROL SULFATE (2.5 MG/3ML) 0.083% IN NEBU: 2.5000 mg | INHALATION_SOLUTION | Freq: Four times a day (QID) | RESPIRATORY_TRACT | Status: DC | PRN

## 2017-10-16 MED ORDER — POTASSIUM CHLORIDE CRYS ER 20 MEQ PO TBCR
40.0000 meq | EXTENDED_RELEASE_TABLET | Freq: Once | ORAL | Status: AC
  Administered 2017-10-16: 40 meq via ORAL
  Filled 2017-10-16: qty 2

## 2017-10-16 MED ORDER — TIZANIDINE HCL 4 MG PO TABS
4.0000 mg | ORAL_TABLET | Freq: Three times a day (TID) | ORAL | Status: DC | PRN
  Administered 2017-10-17 – 2017-10-18 (×2): 4 mg via ORAL
  Filled 2017-10-16 (×2): qty 1

## 2017-10-16 MED ORDER — SODIUM CHLORIDE 0.9 % IV BOLUS (SEPSIS)
1000.0000 mL | Freq: Once | INTRAVENOUS | Status: AC
  Administered 2017-10-16: 1000 mL via INTRAVENOUS

## 2017-10-16 MED ORDER — INSULIN ASPART 100 UNIT/ML ~~LOC~~ SOLN
0.0000 [IU] | Freq: Every day | SUBCUTANEOUS | Status: DC
  Administered 2017-10-17: 2 [IU] via SUBCUTANEOUS

## 2017-10-16 MED ORDER — ATORVASTATIN CALCIUM 20 MG PO TABS
20.0000 mg | ORAL_TABLET | Freq: Every day | ORAL | Status: DC
  Administered 2017-10-16 – 2017-10-19 (×4): 20 mg via ORAL
  Filled 2017-10-16 (×4): qty 1

## 2017-10-16 MED ORDER — ACETAMINOPHEN 650 MG RE SUPP: 650.0000 mg | Freq: Four times a day (QID) | RECTAL | Status: DC | PRN

## 2017-10-16 MED ORDER — INSULIN ASPART 100 UNIT/ML ~~LOC~~ SOLN
0.0000 [IU] | Freq: Three times a day (TID) | SUBCUTANEOUS | Status: DC
  Administered 2017-10-16: 3 [IU] via SUBCUTANEOUS
  Administered 2017-10-17: 5 [IU] via SUBCUTANEOUS
  Administered 2017-10-17 – 2017-10-18 (×4): 3 [IU] via SUBCUTANEOUS
  Administered 2017-10-19 (×2): 2 [IU] via SUBCUTANEOUS
  Filled 2017-10-16: qty 1

## 2017-10-16 MED ORDER — INSULIN GLARGINE 100 UNIT/ML ~~LOC~~ SOLN
15.0000 [IU] | Freq: Two times a day (BID) | SUBCUTANEOUS | Status: DC
Start: 2017-10-16 — End: 2017-10-19
  Administered 2017-10-16 – 2017-10-19 (×6): 15 [IU] via SUBCUTANEOUS
  Filled 2017-10-16 (×7): qty 0.15

## 2017-10-16 MED ORDER — ONDANSETRON HCL 4 MG/2ML IJ SOLN: 4.0000 mg | Freq: Once | INTRAMUSCULAR | Status: DC

## 2017-10-16 MED ORDER — SENNOSIDES-DOCUSATE SODIUM 8.6-50 MG PO TABS: 1.0000 | ORAL_TABLET | Freq: Every evening | ORAL | Status: DC | PRN

## 2017-10-16 NOTE — ED Notes (Signed)
Pt aware that a urine sample is needed.  Pt sts she hasnt been to the bathroom since arriving, but will alert staff when the need arises.

## 2017-10-16 NOTE — ED Notes (Signed)
ED TO INPATIENT HANDOFF REPORT  Name/Age/Gender Marissa Wilkerson 57 y.o. female  Code Status    Code Status Orders  (From admission, onward)        Start     Ordered   10/16/17 1446  Full code  Continuous     10/16/17 1447    Code Status History    Date Active Date Inactive Code Status Order ID Comments User Context   09/15/2017 22:32 09/28/2017 17:16 Full Code [id number]  Elwyn Reach, MD Inpatient   03/05/2014 17:47 03/08/2014 12:29 Full Code [id number]  Gaye Pollack, MD Inpatient   09/02/2013 11:46 09/05/2013 20:09 Full Code [id number]  Modena Jansky, MD Inpatient      Home/SNF/Other Home  Chief Complaint chest pain   Level of Care/Admitting Diagnosis ED Disposition    ED Disposition Condition [facility] Area: [facility] [[id number]]  Level of Care: Telemetry [5]  Admit to tele based on following criteria: Complex arrhythmia (Bradycardia/Tachycardia)  Diagnosis: AKI (acute kidney injury) [facility]) [[id number]]  Admitting Physician: Gerlean Ren [facility] [[id number]]  Attending Physician: Gerlean Ren [facility] [[id number]]  Estimated length of stay: past midnight tomorrow  Certification:: I certify this patient will need inpatient services for at least 2 midnights  PT Class (Do Not Modify): Inpatient [101]  PT Acc Code (Do Not Modify): Private [1]       Medical History Past Medical History:  Diagnosis Date  . Anemia    05/04/15 hemglobin 8.8  . Arthritis   . Diabetes mellitus without complication (HCC)    insulin dependent, po meds  . Diastolic dysfunction    followed by dr polite found on echo 06-12-13 , grade 1 per pt  . Dyslipidemia   . Edema of extremities    PITTING EDEMA     . GERD (gastroesophageal reflux disease)   . Hypertension   . 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 Shortness of breath    ON EXERTION , O2 2-3 l/min  . 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 Vertigo,  benign positional   . Wheezing    OCC    Allergies Allergies  Allergen Reactions  . Cymbalta [Duloxetine Hcl] Other (See Comments)    Hallucinations   . Fentanyl Other (See Comments)    Patches. Blistered skin.   . Metformin And Related Nausea And Vomiting  . Mobic [Meloxicam] Nausea And Vomiting  . Sulfa Antibiotics Rash  . Voltaren [Diclofenac] Nausea And Vomiting    IV Location/Drains/Wounds Patient Lines/Drains/Airways Status   Active Line/Drains/Airways    Name:   Placement date:   Placement time:   Site:   Days:   Peripheral IV 10/16/17 Left Forearm   10/16/17    1201    Forearm   less than 1   Incision (Closed) 03/05/14 Chest Left   03/05/14    1346     1321          Labs/Imaging Results for orders placed or performed during the hospital encounter of 10/16/17 (from the past 48 hour(s))  Basic metabolic panel     Status: Abnormal   Collection Time: 10/16/17 12:10 PM  Result Value Ref Range   Sodium 135 135 - 145 mmol/L   Potassium 3.1 (L) 3.5 - 5.1 mmol/L   Chloride 90 (L) 101 - 111 mmol/L   CO2 27 22 - 32 mmol/L   Glucose,  Bld 249 (H) 65 - 99 mg/dL   BUN 94 (H) 6 - 20 mg/dL    Comment: RESULTS CONFIRMED BY MANUAL DILUTION   Creatinine, Ser 2.41 (H) 0.44 - 1.00 mg/dL   Calcium 9.1 8.9 - 10.3 mg/dL   GFR calc non Af Amer 21 (L) >60 mL/min   GFR calc Af Amer 25 (L) >60 mL/min    Comment: (NOTE) The eGFR has been calculated using the CKD EPI equation. This calculation has not been validated in all clinical situations. eGFR's persistently <60 mL/min signify possible Chronic Kidney Disease.    Anion gap 18 (H) 5 - 15    Comment: Performed at [facility], [place] [address] [place], [place] [address]  CBC     Status: Abnormal   Collection Time: 10/16/17 12:10 PM  Result Value Ref Range   WBC 7.5 4.0 - 10.5 K/uL   RBC 3.75 (L) 3.87 - 5.11 MIL/uL   Hemoglobin 11.7 (L) 12.0 - 15.0 g/dL   HCT 35.0 (L) 36.0 - 46.0 %   MCV 93.3 78.0 - 100.0 fL    MCH 31.2 26.0 - 34.0 pg   MCHC 33.4 30.0 - 36.0 g/dL   RDW 15.5 11.5 - 15.5 %   Platelets 225 150 - 400 K/uL    Comment: Performed at [facility], [place] [address] [place], [place] [address]  Brain natriuretic peptide     Status: None   Collection Time: 10/16/17 12:10 PM  Result Value Ref Range   B Natriuretic Peptide 11.0 0.0 - 100.0 pg/mL    Comment: Performed at [facility], [place] [address] [place], [place] [address]  Hepatic function panel     Status: Abnormal   Collection Time: 10/16/17 12:10 PM  Result Value Ref Range   Total Protein 8.3 (H) 6.5 - 8.1 g/dL   Albumin 3.6 3.5 - 5.0 g/dL   AST 24 15 - 41 U/L   ALT 19 14 - 54 U/L   Alkaline Phosphatase 107 38 - 126 U/L   Total Bilirubin 0.6 0.3 - 1.2 mg/dL   Bilirubin, Direct 0.1 0.1 - 0.5 mg/dL   Indirect Bilirubin 0.5 0.3 - 0.9 mg/dL    Comment: Performed at [facility], [place] [address] [place], [place] [address]  Lipase, blood     Status: None   Collection Time: 10/16/17 12:10 PM  Result Value Ref Range   Lipase 38 11 - 51 U/L    Comment: Performed at [facility], [place] [address], [place],  [address]  I-stat troponin, ED     Status: None   Collection Time: 10/16/17 12:31 PM  Result Value Ref Range   Troponin i, poc 0.02 0.00 - 0.08 ng/mL   Comment 3            Comment: Due to the release kinetics of cTnI, a negative result within the first hours of the onset of symptoms does not rule out myocardial infarction with certainty. If myocardial infarction is still suspected, repeat the test at appropriate intervals.   I-Stat beta hCG blood, ED     Status: None   Collection Time: 10/16/17 12:32 PM  Result Value Ref Range   I-stat hCG, quantitative <5.0 <5 mIU/mL   Comment 3            Comment:   GEST. AGE      CONC.  (mIU/mL)   <=1 WEEK  5 - 50     2 WEEKS       50 - 500     3 WEEKS       100 - 10,000     4 WEEKS     1,000 -  30,000        FEMALE AND NON-PREGNANT FEMALE:     LESS THAN 5 mIU/mL   I-Stat Troponin, ED (not at [facility])     Status: None   Collection Time: 10/16/17  4:20 PM  Result Value Ref Range   Troponin i, poc 0.02 0.00 - 0.08 ng/mL   Comment 3            Comment: Due to the release kinetics of cTnI, a negative result within the first hours of the onset of symptoms does not rule out myocardial infarction with certainty. If myocardial infarction is still suspected, repeat the test at appropriate intervals.   CBG monitoring, ED     Status: Abnormal   Collection Time: 10/16/17  5:31 PM  Result Value Ref Range   Glucose-Capillary 183 (H) 65 - 99 mg/dL  Urinalysis, Routine w reflex microscopic     Status: Abnormal   Collection Time: 10/16/17  8:22 PM  Result Value Ref Range   Color, Urine YELLOW YELLOW   APPearance HAZY (A) CLEAR   Specific Gravity, Urine 1.018 1.005 - 1.030   pH 5.0 5.0 - 8.0   Glucose, UA 150 (A) NEGATIVE mg/dL   Hgb urine dipstick SMALL (A) NEGATIVE   Bilirubin Urine NEGATIVE NEGATIVE   Ketones, ur 20 (A) NEGATIVE mg/dL   Protein, ur >=300 (A) NEGATIVE mg/dL   Nitrite NEGATIVE NEGATIVE   Leukocytes, UA MODERATE (A) NEGATIVE   RBC / HPF 0-5 0 - 5 RBC/hpf   WBC, UA TOO NUMEROUS TO COUNT 0 - 5 WBC/hpf   Bacteria, UA MANY (A) NONE SEEN   Squamous Epithelial / LPF 0-5 (A) NONE SEEN   WBC Clumps PRESENT    Hyaline Casts, UA PRESENT     Comment: Performed at [facility], [place] [address], [place],  [address]   Dg Chest 2 View  Result Date: 10/16/2017 CLINICAL DATA:  Chest pain and chronic shortness of breath. History of sarcoidosis. EXAM: CHEST  2 VIEW COMPARISON:  Chest x-ray dated September 15, 2017. FINDINGS: Stable cardiomegaly and mild pulmonary vascular congestion. No focal consolidation, pleural effusion, or pneumothorax. No acute osseous abnormality. IMPRESSION: Stable cardiomegaly and mild vascular congestion. Electronically Signed   By:  Titus Dubin M.D.   On: 10/16/2017 12:45   Dg Abd 1 View  Result Date: 10/16/2017 CLINICAL DATA:  Nausea and vomiting for several days EXAM: ABDOMEN - 1 VIEW COMPARISON:  None. FINDINGS: Scattered large and small bowel gas is noted. No free air is noted. No abnormal mass or abnormal calcifications are seen. No acute bony abnormality is noted. IMPRESSION: No acute abnormality noted. Electronically Signed   By: Inez Catalina M.D.   On: 10/16/2017 16:09    Pending Labs Unresulted Labs (From admission, onward)   Start     Ordered   10/17/17 4239  Basic metabolic panel  Daily,   R     10/16/17 1447   10/17/17 0500  Magnesium  Daily,   R     10/16/17 1447   10/16/17 1447  Troponin I  Now then every 6 hours,   R     10/16/17 1447   10/16/17 1445  CBC  (heparin)  Once,   R    Comments:  Baseline for heparin therapy IF NOT ALREADY DRAWN.  Notify MD if PLT < 100 K.    10/16/17 1447   10/16/17 1445  Creatinine, serum  (heparin)  Once,   R    Comments:  Baseline for heparin therapy IF NOT ALREADY DRAWN.    10/16/17 1447      Vitals/Pain Today's Vitals   10/16/17 1735 10/16/17 1745 10/16/17 1830 10/16/17 2009  BP: (!) 146/61 (!) 134/49 (!) 157/106 120/62  Pulse: 77 76 81 84  Resp: '16 14 14 12  ' Temp:      TempSrc:      SpO2: 99% 99% 100% 100%  Weight:      Height:      PainSc:        Isolation Precautions No active isolations  Medications Medications  albuterol (PROVENTIL) (2.5 MG/3ML) 0.083% nebulizer solution 2.5 mg (not administered)  atorvastatin (LIPITOR) tablet 20 mg (20 mg Oral Given 10/16/17 1636)  carvedilol (COREG) tablet 6.25 mg (6.25 mg Oral Given 10/16/17 1636)  fluticasone (FLONASE) 50 MCG/ACT nasal spray 1 spray (1 spray Each Nare Given 10/16/17 2009)  insulin glargine (LANTUS) injection 15 Units (not administered)  phenytoin (DILANTIN) ER capsule 200 mg (200 mg Oral Given 10/16/17 2008)  potassium chloride SA (K-DUR,KLOR-CON) CR tablet 20 mEq (20 mEq Oral Not Given  10/16/17 1508)  promethazine (PHENERGAN) tablet 25 mg (25 mg Oral Given 10/16/17 2016)  linagliptin (TRADJENTA) tablet 5 mg (5 mg Oral Given 10/16/17 1637)  tiZANidine (ZANAFLEX) tablet 4 mg (not administered)  heparin injection 5,000 Units (5,000 Units Subcutaneous Given 10/16/17 1637)  0.9 %  sodium chloride infusion ( Intravenous Not Given 10/16/17 1509)  acetaminophen (TYLENOL) tablet 650 mg (not administered)    Or  acetaminophen (TYLENOL) suppository 650 mg (not administered)  HYDROcodone-acetaminophen (NORCO/VICODIN) 5-325 MG per tablet 1-2 tablet (not administered)  senna-docusate (Senokot-S) tablet 1 tablet (not administered)  bisacodyl (DULCOLAX) EC tablet 5 mg (not administered)  insulin aspart (novoLOG) injection 0-15 Units (3 Units Subcutaneous Given 10/16/17 1735)  insulin aspart (novoLOG) injection 0-5 Units (not administered)  aspirin tablet 325 mg (325 mg Oral Given 10/16/17 1636)  ondansetron (ZOFRAN) injection 4 mg (4 mg Intravenous Not Given 10/16/17 1509)  sodium chloride 0.9 % bolus 1,000 mL (0 mLs Intravenous Stopped 10/16/17 1633)  ondansetron (ZOFRAN) injection 4 mg (4 mg Intravenous Given 10/16/17 1459)  potassium chloride SA (K-DUR,KLOR-CON) CR tablet 40 mEq (40 mEq Oral Given 10/16/17 1459)    Mobility walks

## 2017-10-16 NOTE — ED Notes (Signed)
Bed: WA19 Expected date:  Expected time:  Means of arrival:  Comments: 

## 2017-10-16 NOTE — ED Provider Notes (Signed)
[place] DEPT Provider Note   CSN: [id number] Arrival date & time: 10/16/17  1151     History   Chief Complaint Chief Complaint  Patient presents with  . Chest Pain    HPI Marissa Wilkerson is a 57 y.o. female with history of DM, dyslipidemia, GERD, HTN, sarcoidosis, CHF, and vertigo presents for evaluation of acute onset, resolved episode of chest pain which occurred approximately 1.5 hours prior to arrival.  She states that she developed aching anterior chest pain which occurred at rest.  Pain did not radiate.  No aggravating or alleviating factors.  No associated shortness of breath, diaphoresis, lightheadedness, or nausea.  She took 2 sublingual nitroglycerin tablets with resolution of her symptoms.  Pain lasted approximately 35-40 minutes.  No hemoptysis, states she has a prior history of DVT but states she was told it was not significant enough to start blood thinners.  She notes no worsening of her usual shortness of breath.  She states that 1.5-weeks ago she developed nasal congestion and productive cough, denies fevers or chills.  She states these URI like symptoms improved but for the past 4 days she has not been able to tolerate any p.o. food or fluids.  She notes 3 episodes of watery nonbloody diarrhea with semi-formed stools in between.  She denies melena or hematochezia.  She denies abdominal pain.  She notes emesis is nonbloody and nonbilious.  She denies urinary symptoms.  She states she has been compliant with her diuretics but has not been able to keep down her medications for the past 4 days.  The history is provided by the patient.  Chest Pain   Associated symptoms include cough, nausea, shortness of breath and vomiting. Pertinent negatives include no abdominal pain, no fever and no palpitations.    Past Medical History:  Diagnosis Date  . Anemia    05/04/15 hemglobin 8.8  . Arthritis   . Diabetes mellitus without complication (HCC)    insulin dependent, po meds  . Diastolic dysfunction    followed by dr polite found on echo 06-12-13 , grade 1 per pt  . Dyslipidemia   . Edema of extremities    PITTING EDEMA     . GERD (gastroesophageal reflux disease)   . Hypertension   . 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 Shortness of breath    ON EXERTION , O2 2-3 l/min  . 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 Vertigo, benign positional   . Wheezing    OCC    Patient Active Problem List   Diagnosis Date Noted  . AKI (acute kidney injury) ([place]) 10/16/2017  . CHF (congestive heart failure), NYHA class I, unspecified failure chronicity, diastolic ([place]) 19/41/7408  . Hyperkalemia 09/15/2017  . CHF (congestive heart failure) ([place]) 09/15/2017  . Hyperlipidemia 08/08/2016  . Bilateral carotid artery disease ([place]) 08/08/2016  . Lower extremity weakness 02/22/2016  . Multiple lung nodules 03/05/2014  . Obstructive lung disease ([place]) 02/09/2014  . Chronic respiratory failure ([place]) 09/02/2013  . 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 Hypertension   . Diabetes mellitus without complication ([place])   . Diastolic dysfunction     Past Surgical History:  Procedure Laterality Date  . all teeth extraction  7-8 yrs ago  . CHOLECYSTECTOMY  1996  . COLONOSCOPY WITH PROPOFOL N/A 08/05/2013   Procedure: COLONOSCOPY WITH PROPOFOL;  Surgeon: Garlan Fair, MD;  Location: [facility];  Service: Endoscopy;  Laterality: N/A;  . ESOPHAGOGASTRODUODENOSCOPY (EGD) WITH PROPOFOL N/A 05/31/2015   Procedure: ESOPHAGOGASTRODUODENOSCOPY (EGD) WITH PROPOFOL;  Surgeon: Garlan Fair, MD;  Location: [facility];  Service: Endoscopy;  Laterality: N/A;  . LUNG BIOPSY Left 03/05/2014   Procedure: LUNG BIOPSY;  Surgeon: Gaye Pollack, MD;  Location: [place];  Service: Thoracic;  Laterality: Left;  [name] VIDEO ASSISTED THORACOSCOPY Left 03/05/2014   Procedure: VIDEO ASSISTED THORACOSCOPY;  Surgeon:  Gaye Pollack, MD;  Location: [place];  Service: Thoracic;  Laterality: Left;  [name] VIDEO BRONCHOSCOPY Bilateral 09/04/2013   Procedure: VIDEO BRONCHOSCOPY WITH FLUORO;  Surgeon: Collene Gobble, MD;  Location: [facility];  Service: Cardiopulmonary;  Laterality: Bilateral;  . WISDOM TOOTH EXTRACTION  yrs ago    OB History    No data available       Home Medications    Prior to Admission medications   Medication Sig Start Date End Date Taking? Authorizing Provider  albuterol (PROVENTIL HFA;VENTOLIN HFA) 108 (90 Base) MCG/ACT inhaler Inhale 2 puffs into the lungs every 6 (six) hours as needed for wheezing or shortness of breath. 09/28/17  Yes Georgette Shell, MD  atorvastatin (LIPITOR) 20 MG tablet Take 1 tablet (20 mg total) by mouth daily. 09/28/17  Yes Georgette Shell, MD  carvedilol (COREG) 6.25 MG tablet Take 1 tablet (6.25 mg total) by mouth 2 (two) times daily with a meal. 09/28/17  Yes Georgette Shell, MD  fluticasone [facility]) 50 MCG/ACT nasal spray Place 1 spray into both nostrils daily. 09/29/17  Yes Georgette Shell, MD  HYDROcodone-acetaminophen (NORCO) 7.5-325 MG tablet Take 1 tablet by mouth every 6 (six) hours as needed for moderate pain. 09/28/17  Yes Georgette Shell, MD  insulin aspart (NOVOLOG) 100 UNIT/ML injection Inject 15-25 Units into the skin 2 (two) times daily before a meal. Sliding scale 09/28/17  Yes Georgette Shell, MD  Insulin Glargine (TOUJEO SOLOSTAR) 300 UNIT/ML SOPN Inject 25 Units into the skin 2 (two) times daily. 09/28/17  Yes Georgette Shell, MD  phenytoin (DILANTIN) 200 MG ER capsule Take 1 capsule (200 mg total) by mouth 2 (two) times daily. 09/28/17 10/28/17 Yes Georgette Shell, MD  potassium chloride SA (K-DUR,KLOR-CON) 20 MEQ tablet Take 1 tablet (20 mEq total) by mouth daily. 09/28/17  Yes Georgette Shell, MD  promethazine (PHENERGAN) 25 MG tablet Take 1 tablet (25 mg total) by mouth every 4 (four) hours as needed for nausea or  vomiting. 09/28/17  Yes Georgette Shell, MD  sitaGLIPtin (JANUVIA) 100 MG tablet Take 1 tablet (100 mg total) by mouth daily. 09/28/17  Yes Georgette Shell, MD  tiZANidine (ZANAFLEX) 4 MG tablet Take 1 tablet (4 mg total) by mouth every 8 (eight) hours as needed for muscle spasms. 09/28/17  Yes Georgette Shell, MD  torsemide (DEMADEX) 100 MG tablet Take 1 tablet (100 mg total) by mouth 2 (two) times daily. 09/28/17  Yes Georgette Shell, MD  gabapentin (NEURONTIN) 300 MG capsule Take 2 capsules (600 mg total) by mouth 3 (three) times daily. Take 300mg  in AM and 600mg  in PM Patient not taking: Reported on 10/16/2017 09/28/17   Georgette Shell, MD  L-Methylfolate-Algae-B12-B6 [facility]) 3-90.314-2-35 MG CAPS Take 1 capsule by mouth 2 (two) times daily. Patient not taking: Reported on 10/16/2017 09/28/17   Georgette Shell, MD  metolazone (ZAROXOLYN) 2.5 MG tablet Take 1 tablet (2.5 mg total) by mouth 2 (two)  times daily. Patient not taking: Reported on 10/16/2017 09/28/17   Georgette Shell, MD  mouth rinse LIQD solution 15 mLs by Mouth Rinse route 2 (two) times daily. Patient not taking: Reported on 10/16/2017 09/28/17   Georgette Shell, MD  phenytoin (DILANTIN) 200 MG ER capsule Take 200 mg by mouth 2 (two) times daily. 07/28/16 09/15/17  [provider]    Family History Family History  Problem Relation Age of Onset  . Heart failure Mother   . Diabetes Father   . Hypertension Father   . Diabetes Brother   . Hypertension Brother     Social History Social History   Tobacco Use  . Smoking status: Former Smoker    Packs/day: 0.50    Years: 25.00    Pack years: 12.50    Types: Cigarettes    Last attempt to quit: 08/21/2012    Years since quitting: 5.1  . Smokeless tobacco: Never Used  Substance Use Topics  . Alcohol use: Yes    Comment: very rare  . Drug use: No     Allergies   Cymbalta [duloxetine hcl]; Fentanyl; Metformin and related; Mobic  [meloxicam]; Sulfa antibiotics; and Voltaren [diclofenac]   Review of Systems Review of Systems  Constitutional: Negative for chills and fever.  HENT: Positive for congestion.   Respiratory: Positive for cough and shortness of breath.   Cardiovascular: Positive for chest pain. Negative for palpitations and leg swelling.  Gastrointestinal: Positive for diarrhea, nausea and vomiting. Negative for abdominal pain.  Genitourinary: Negative for dysuria and hematuria.  All other systems reviewed and are negative.    Physical Exam Updated Vital Signs BP (!) 163/97   Pulse 84   Temp 98.4 F (36.9 C) (Oral)   Resp 16   Ht 5\' 3"  (1.6 m)   Wt 113.4 kg (250 lb)   LMP 08/28/2008   SpO2 100%   BMI 44.29 kg/m   Physical Exam  Constitutional: She appears well-developed and well-nourished. No distress.  HENT:  Head: Normocephalic and atraumatic.  Eyes: Conjunctivae are normal. Right eye exhibits no discharge. Left eye exhibits no discharge.  Neck: Normal range of motion. Neck supple. No JVD present. No tracheal deviation present.  Cardiovascular: Normal rate and regular rhythm.  Pulses:      Carotid pulses are 2+ on the right side, and 2+ on the left side.      Radial pulses are 2+ on the right side, and 2+ on the left side.       Dorsalis pedis pulses are 2+ on the right side, and 2+ on the left side.       Posterior tibial pulses are 2+ on the right side, and 2+ on the left side.  2+ radial and DP/PT pulses bl, negative Homan's bl, mild tenderness to palpation of the left calf.  No lower extremity edema, warmth, swelling, or erythema.  Pulmonary/Chest: Effort normal.  Equal rise and fall of chest, no increased work of breathing.  Anterior chest wall tender to palpation with no deformity, crepitus, ecchymosis, or paradoxical wall motion.  Abdominal: Soft. Bowel sounds are normal. She exhibits no distension. There is tenderness.  Mild epigastric tenderness, Murphy sign absent, Rovsing's  absent, no CVA tenderness  Musculoskeletal: She exhibits no edema.  Neurological: She is alert.  Skin: No erythema.  Psychiatric: She has a normal mood and affect. Her behavior is normal.  Nursing note and vitals reviewed.    ED Treatments / Results  Labs (all labs ordered  are listed, but only abnormal results are displayed) Labs Reviewed  BASIC METABOLIC PANEL - Abnormal; Notable for the following components:      Result Value   Potassium 3.1 (*)    Chloride 90 (*)    Glucose, Bld 249 (*)    BUN 94 (*)    Creatinine, Ser 2.41 (*)    GFR calc non Af Amer 21 (*)    GFR calc Af Amer 25 (*)    Anion gap 18 (*)    All other components within normal limits  CBC - Abnormal; Notable for the following components:   RBC 3.75 (*)    Hemoglobin 11.7 (*)    HCT 35.0 (*)    All other components within normal limits  HEPATIC FUNCTION PANEL - Abnormal; Notable for the following components:   Total Protein 8.3 (*)    All other components within normal limits  BRAIN NATRIURETIC PEPTIDE  LIPASE, BLOOD  URINALYSIS, ROUTINE W REFLEX MICROSCOPIC  CBC  CREATININE, SERUM  TROPONIN I  TROPONIN I  TROPONIN I  I-STAT TROPONIN, ED  I-STAT BETA HCG BLOOD, ED ([facility], [facility], [facility] ONLY)  I-STAT TROPONIN, ED    EKG  EKG Interpretation  Date/Time:  Tuesday October 16 2017 12:19:47 EST Ventricular Rate:  86 PR Interval:    QRS Duration: 105 QT Interval:  375 QTC Calculation: 449 R Axis:   17 Text Interpretation:  Sinus rhythm isolated flipped t in aVL, ? seen in prior Otherwise no significant change Confirmed by Deno Etienne [id number]) on 10/16/2017 2:14:56 PM       Radiology Dg Chest 2 View  Result Date: 10/16/2017 CLINICAL DATA:  Chest pain and chronic shortness of breath. History of sarcoidosis. EXAM: CHEST  2 VIEW COMPARISON:  Chest x-ray dated September 15, 2017. FINDINGS: Stable cardiomegaly and mild pulmonary vascular congestion. No focal consolidation, pleural effusion, or pneumothorax. No  acute osseous abnormality. IMPRESSION: Stable cardiomegaly and mild vascular congestion. Electronically Signed   By: Titus Dubin M.D.   On: 10/16/2017 12:45    Procedures Procedures (including critical care time)  Medications Ordered in ED Medications  sodium chloride 0.9 % bolus 1,000 mL (1,000 mLs Intravenous New Bag/Given 10/16/17 1500)  albuterol (PROVENTIL) (2.5 MG/3ML) 0.083% nebulizer solution 2.5 mg (not administered)  atorvastatin (LIPITOR) tablet 20 mg (not administered)  carvedilol (COREG) tablet 6.25 mg (not administered)  fluticasone (FLONASE) 50 MCG/ACT nasal spray 1 spray (not administered)  insulin glargine (LANTUS) injection 15 Units (not administered)  phenytoin (DILANTIN) ER capsule 200 mg (not administered)  potassium chloride SA (K-DUR,KLOR-CON) CR tablet 20 mEq (20 mEq Oral Not Given 10/16/17 1508)  promethazine (PHENERGAN) tablet 25 mg (not administered)  linagliptin (TRADJENTA) tablet 5 mg (not administered)  tiZANidine (ZANAFLEX) tablet 4 mg (not administered)  heparin injection 5,000 Units (not administered)  0.9 %  sodium chloride infusion ( Intravenous Not Given 10/16/17 1509)  acetaminophen (TYLENOL) tablet 650 mg (not administered)    Or  acetaminophen (TYLENOL) suppository 650 mg (not administered)  HYDROcodone-acetaminophen (NORCO/VICODIN) 5-325 MG per tablet 1-2 tablet (not administered)  senna-docusate (Senokot-S) tablet 1 tablet (not administered)  bisacodyl (DULCOLAX) EC tablet 5 mg (not administered)  insulin aspart (novoLOG) injection 0-15 Units (not administered)  insulin aspart (novoLOG) injection 0-5 Units (not administered)  aspirin tablet 325 mg (not administered)  ondansetron (ZOFRAN) injection 4 mg (4 mg Intravenous Not Given 10/16/17 1509)  ondansetron (ZOFRAN) injection 4 mg (4 mg Intravenous Given 10/16/17 1459)  potassium chloride SA (K-DUR,KLOR-CON) CR  tablet 40 mEq (40 mEq Oral Given 10/16/17 1459)     Initial Impression /  Assessment and Plan / ED Course  I have reviewed the triage vital signs and the nursing notes.  Pertinent labs & imaging results that were available during my care of the patient were reviewed by me and considered in my medical decision making (see chart for details).     Patient presents with complaint of 4 days of inability to tolerate p.o. food and fluids as well as one episode of chest pain which resolved prior to arrival.  Afebrile, initially normotensive but became somewhat hypertensive while in the ED.  Initial troponin is negative and EKG does not show any ST segment abnormalities or significant changes.  Chest x-ray shows pulmonary vascular congestion and cardiomegaly but no evidence of consolidation. BNP is within normal limits and I doubt CHF exacerbation at this time.  I doubt PE at this time. Of note, patient found to have elevated creatinine of 2.41 as compared to her baseline at discharge 2 weeks ago Of 1.06.  Suspect possible AKI secondary to dehydration versus adverse effect from her diuretics.  Initiated fluid rehydration.  Spoke with Dr. Reesa Chew who agrees to assume care of patient and bring her into the hospital for futher evaluation and management.  Final Clinical Impressions(s) / ED Diagnoses   Final diagnoses:  AKI (acute kidney injury) ([place])  Hypokalemia  Nausea vomiting and diarrhea  Atypical chest pain    ED Discharge Orders    None       Renita Papa, PA-C 10/16/17 [place], [place], DO 10/16/17 1624

## 2017-10-16 NOTE — Progress Notes (Signed)
CRITICAL VALUE ALERT  Critical Value:  Troponin 0.03  Date & Time Notied:  10/16/2017   2256  Provider Notified: Tylene Fantasia NP   Orders Received/Actions taken:

## 2017-10-16 NOTE — H&P (Signed)
History and Physical    Marissa Wilkerson ATF:[id number] DOB: 08-29-1960 DOA: 10/16/2017  PCP: Jani Gravel, MD Patient coming from: Home  Chief Complaint: Chest discomfort  HPI: Marissa Wilkerson is a 57 y.o. female with medical history significant of uncontrolled diabetes mellitus type 2, hypertension, sarcoidosis, anemia of chronic disease comes to the hospital with complaints of chest discomfort.  Patient states since her discharge from the hospital about a she was doing well at first last week developed some URI type of symptoms which improved and then 2 days ago she started having nausea with an episode of loose diarrhea and vomiting.  Due to the combinations of the symptoms she came to the ER for further evaluation.  She denies any fevers, chills and other complaints. In the ER she was noted to have elevated creatinine of 2.41, up from baseline of 1.06.  Chest x-ray showed mild vascular congestion with cardiomegaly.  BNP was 11, previously it was 299 when she was admitted for CHF exacerbation.  EKG did not reveal any ST-T changes. She was admitted for evaluation of acute kidney injury.   Review of Systems: As per HPI otherwise 10 point review of systems negative.   Past Medical History:  Diagnosis Date  . Anemia    05/04/15 hemglobin 8.8  . Arthritis   . Diabetes mellitus without complication (HCC)    insulin dependent, po meds  . Diastolic dysfunction    followed by dr polite found on echo 06-12-13 , grade 1 per pt  . Dyslipidemia   . Edema of extremities    PITTING EDEMA     . GERD (gastroesophageal reflux disease)   . Hypertension   . Sarcoidosis of lung [facility])    pulmonologist Dr.Byrum, [id number]   . Shortness of breath    ON EXERTION , O2 2-3 l/min  . 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 Vertigo, benign positional   . Wheezing    OCC    Past Surgical History:  Procedure Laterality Date  . all teeth extraction  7-8 yrs ago  . CHOLECYSTECTOMY   1996  . COLONOSCOPY WITH PROPOFOL N/A 08/05/2013   Procedure: COLONOSCOPY WITH PROPOFOL;  Surgeon: Garlan Fair, MD;  Location: [facility];  Service: Endoscopy;  Laterality: N/A;  . ESOPHAGOGASTRODUODENOSCOPY (EGD) WITH PROPOFOL N/A 05/31/2015   Procedure: ESOPHAGOGASTRODUODENOSCOPY (EGD) WITH PROPOFOL;  Surgeon: Garlan Fair, MD;  Location: [facility];  Service: Endoscopy;  Laterality: N/A;  . LUNG BIOPSY Left 03/05/2014   Procedure: LUNG BIOPSY;  Surgeon: Gaye Pollack, MD;  Location: [place];  Service: Thoracic;  Laterality: Left;  [name] VIDEO ASSISTED THORACOSCOPY Left 03/05/2014   Procedure: VIDEO ASSISTED THORACOSCOPY;  Surgeon: Gaye Pollack, MD;  Location: [place];  Service: Thoracic;  Laterality: Left;  [name] VIDEO BRONCHOSCOPY Bilateral 09/04/2013   Procedure: VIDEO BRONCHOSCOPY WITH FLUORO;  Surgeon: Collene Gobble, MD;  Location: [facility];  Service: Cardiopulmonary;  Laterality: Bilateral;  . WISDOM TOOTH EXTRACTION  yrs ago     reports that she quit smoking about 5 years ago. Her smoking use included cigarettes. She has a 12.50 pack-year smoking history. she has never used smokeless tobacco. She reports that she drinks alcohol. She reports that she does not use drugs.  Allergies  Allergen Reactions  . Cymbalta [Duloxetine Hcl] Other (See Comments)    Hallucinations   . Fentanyl Other (See Comments)    Patches. Blistered skin.   . Metformin  And Related Nausea And Vomiting  . Mobic [Meloxicam] Nausea And Vomiting  . Sulfa Antibiotics Rash  . Voltaren [Diclofenac] Nausea And Vomiting    Family History  Problem Relation Age of Onset  . Heart failure Mother   . Diabetes Father   . Hypertension Father   . Diabetes Brother   . Hypertension Brother      Prior to Admission medications   Medication Sig Start Date End Date Taking? Authorizing Provider  albuterol (PROVENTIL HFA;VENTOLIN HFA) 108 (90 Base) MCG/ACT inhaler Inhale 2 puffs into the lungs every 6 (six) hours as  needed for wheezing or shortness of breath. 09/28/17  Yes Georgette Shell, MD  atorvastatin (LIPITOR) 20 MG tablet Take 1 tablet (20 mg total) by mouth daily. 09/28/17  Yes Georgette Shell, MD  carvedilol (COREG) 6.25 MG tablet Take 1 tablet (6.25 mg total) by mouth 2 (two) times daily with a meal. 09/28/17  Yes Georgette Shell, MD  fluticasone [facility]) 50 MCG/ACT nasal spray Place 1 spray into both nostrils daily. 09/29/17  Yes Georgette Shell, MD  HYDROcodone-acetaminophen (NORCO) 7.5-325 MG tablet Take 1 tablet by mouth every 6 (six) hours as needed for moderate pain. 09/28/17  Yes Georgette Shell, MD  insulin aspart (NOVOLOG) 100 UNIT/ML injection Inject 15-25 Units into the skin 2 (two) times daily before a meal. Sliding scale 09/28/17  Yes Georgette Shell, MD  Insulin Glargine (TOUJEO SOLOSTAR) 300 UNIT/ML SOPN Inject 25 Units into the skin 2 (two) times daily. 09/28/17  Yes Georgette Shell, MD  phenytoin (DILANTIN) 200 MG ER capsule Take 1 capsule (200 mg total) by mouth 2 (two) times daily. 09/28/17 10/28/17 Yes Georgette Shell, MD  potassium chloride SA (K-DUR,KLOR-CON) 20 MEQ tablet Take 1 tablet (20 mEq total) by mouth daily. 09/28/17  Yes Georgette Shell, MD  promethazine (PHENERGAN) 25 MG tablet Take 1 tablet (25 mg total) by mouth every 4 (four) hours as needed for nausea or vomiting. 09/28/17  Yes Georgette Shell, MD  sitaGLIPtin (JANUVIA) 100 MG tablet Take 1 tablet (100 mg total) by mouth daily. 09/28/17  Yes Georgette Shell, MD  tiZANidine (ZANAFLEX) 4 MG tablet Take 1 tablet (4 mg total) by mouth every 8 (eight) hours as needed for muscle spasms. 09/28/17  Yes Georgette Shell, MD  torsemide (DEMADEX) 100 MG tablet Take 1 tablet (100 mg total) by mouth 2 (two) times daily. 09/28/17  Yes Georgette Shell, MD  gabapentin (NEURONTIN) 300 MG capsule Take 2 capsules (600 mg total) by mouth 3 (three) times daily. Take 300mg  in AM and 600mg  in PM Patient  not taking: Reported on 10/16/2017 09/28/17   Georgette Shell, MD  L-Methylfolate-Algae-B12-B6 [facility]) 3-90.314-2-35 MG CAPS Take 1 capsule by mouth 2 (two) times daily. Patient not taking: Reported on 10/16/2017 09/28/17   Georgette Shell, MD  metolazone (ZAROXOLYN) 2.5 MG tablet Take 1 tablet (2.5 mg total) by mouth 2 (two) times daily. Patient not taking: Reported on 10/16/2017 09/28/17   Georgette Shell, MD  mouth rinse LIQD solution 15 mLs by Mouth Rinse route 2 (two) times daily. Patient not taking: Reported on 10/16/2017 09/28/17   Georgette Shell, MD  phenytoin (DILANTIN) 200 MG ER capsule Take 200 mg by mouth 2 (two) times daily. 07/28/16 09/15/17  [provider]    Physical Exam: Vitals:   10/16/17 1204 10/16/17 1208 10/16/17 1218 10/16/17 1315  BP: (!) 107/58 127/71  (!) 152/91  Pulse: 85 88  79  Resp: 18   16  Temp: 98.6 F (37 C) 98.4 F (36.9 C)    TempSrc: Oral Oral    SpO2: 100% 100%  100%  Weight:   113.4 kg (250 lb)   Height:   5\' 3"  (1.6 m)       Constitutional: NAD, calm, comfortable, morbidly obese Vitals:   10/16/17 1204 10/16/17 1208 10/16/17 1218 10/16/17 1315  BP: (!) 107/58 127/71  (!) 152/91  Pulse: 85 88  79  Resp: 18   16  Temp: 98.6 F (37 C) 98.4 F (36.9 C)    TempSrc: Oral Oral    SpO2: 100% 100%  100%  Weight:   113.4 kg (250 lb)   Height:   5\' 3"  (1.6 m)    Eyes: PERRL, lids and conjunctivae normal ENMT: Mucous membranes are moist. Posterior pharynx clear of any exudate or lesions.Normal dentition.  Neck: normal, supple, no masses, no thyromegaly Respiratory: Diffuse diminished breath sounds likely secondary to her body habitus Cardiovascular: Regular rate and rhythm, no murmurs / rubs / gallops. No extremity edema. 2+ pedal pulses. No carotid bruits.  Abdomen: no tenderness, no masses palpated. No hepatosplenomegaly. Bowel sounds positive.  Musculoskeletal: no clubbing / cyanosis. No joint deformity upper and lower  extremities. Good ROM, no contractures. Normal muscle tone.  Skin: no rashes, lesions, ulcers. No induration Neurologic: CN 2-12 grossly intact. Sensation intact, DTR normal. Strength 5/5 in all 4.  Psychiatric: Normal judgment and insight. Alert and oriented x 3. Normal mood.     Labs on Admission: I have personally reviewed following labs and imaging studies  CBC: Recent Labs  Lab 10/16/17 1210  WBC 7.5  HGB 11.7*  HCT 35.0*  MCV 93.3  PLT 426   Basic Metabolic Panel: Recent Labs  Lab 10/16/17 1210  NA 135  K 3.1*  CL 90*  CO2 27  GLUCOSE 249*  BUN 94*  CREATININE 2.41*  CALCIUM 9.1   GFR: Estimated Creatinine Clearance: 32 mL/min (A) (by C-G formula based on SCr of 2.41 mg/dL (H)). Liver Function Tests: Recent Labs  Lab 10/16/17 1210  AST 24  ALT 19  ALKPHOS 107  BILITOT 0.6  PROT 8.3*  ALBUMIN 3.6   Recent Labs  Lab 10/16/17 1210  LIPASE 38   No results for input(s): AMMONIA in the last 168 hours. Coagulation Profile: No results for input(s): INR, PROTIME in the last 168 hours. Cardiac Enzymes: No results for input(s): CKTOTAL, CKMB, CKMBINDEX, TROPONINI in the last 168 hours. BNP (last 3 results) No results for input(s): PROBNP in the last 8760 hours. HbA1C: No results for input(s): HGBA1C in the last 72 hours. CBG: No results for input(s): GLUCAP in the last 168 hours. Lipid Profile: No results for input(s): CHOL, HDL, LDLCALC, TRIG, CHOLHDL, LDLDIRECT in the last 72 hours. Thyroid Function Tests: No results for input(s): TSH, T4TOTAL, FREET4, T3FREE, THYROIDAB in the last 72 hours. Anemia Panel: No results for input(s): VITAMINB12, FOLATE, FERRITIN, TIBC, IRON, RETICCTPCT in the last 72 hours. Urine analysis:    Component Value Date/Time   COLORURINE YELLOW 09/17/2017 1437   APPEARANCEUR HAZY (A) 09/17/2017 1437   LABSPEC 1.015 09/17/2017 1437   PHURINE 6.0 09/17/2017 1437   GLUCOSEU 50 (A) 09/17/2017 1437   HGBUR SMALL (A) 09/17/2017  1437   BILIRUBINUR NEGATIVE 09/17/2017 1437   KETONESUR 5 (A) 09/17/2017 1437   PROTEINUR >=300 (A) 09/17/2017 1437   UROBILINOGEN 1.0 03/03/2014 8341  NITRITE POSITIVE (A) 09/17/2017 1437   LEUKOCYTESUR TRACE (A) 09/17/2017 1437   Sepsis Labs: !!!!!!!!!!!!!!!!!!!!!!!!!!!!!!!!!!!!!!!!!!!! @LABRCNTIP (procalcitonin:4,lacticidven:4) )No results found for this or any previous visit (from the past 240 hour(s)).   Radiological Exams on Admission: Dg Chest 2 View  Result Date: 10/16/2017 CLINICAL DATA:  Chest pain and chronic shortness of breath. History of sarcoidosis. EXAM: CHEST  2 VIEW COMPARISON:  Chest x-ray dated September 15, 2017. FINDINGS: Stable cardiomegaly and mild pulmonary vascular congestion. No focal consolidation, pleural effusion, or pneumothorax. No acute osseous abnormality. IMPRESSION: Stable cardiomegaly and mild vascular congestion. Electronically Signed   By: Titus Dubin M.D.   On: 10/16/2017 12:45    EKG: Independently reviewed.  Normal sinus rhythm  Assessment/Plan Active Problems:   AKI (acute kidney injury) ([place])    Acute kidney injury - Acute kidney injury likely appears to be prerenal in nature.  Creatinine today is 2.41, baseline 1.0 -Hold off on patient's torsemide, patient's continued gentle hydration with normal saline 75 cc/h - Due to her history of CHF monitor for any signs of fluid overload - Monitor urine output, trend creatinine -Avoid nephrotoxic drugs  Nausea and nonbloody vomiting - Patient's feeling nauseous at this time and at home had an episode of nonbloody vomiting - Will obtain abdominal x-ray for now.  Once the renal function improves we can get a CT abdomen pelvis with contrast in case if her symptoms still persist -Diet as tolerated -Questionable diarrhea, if continues to have any inpatient watery bowel movement we will check further stool studies.  Atypical chest pain -Admit the patient for chest pain workup -Aspirin ordered -  EKG shows- normal sinus rhythm -Trend cardiac enzymes for 2 more times -Supplemental oxygen as necessary -Nitro if necessary for chest pain  Diabetes mellitus type 2 - Lantus dose has been decreased from 25 units twice daily to 15 units twice daily in the setting of AK I, continue p.o. medication -Accu-Chek and sliding scale  History of sarcoidosis with ung nodule -Needs to follow-up patient with Dr. Lamonte Sakai  Anemia of chronic disease  -No signs of bleeding, closely monitor hemoglobin  Essential hypertension -Continue Coreg  DVT prophylaxis: Subcutaneous heparin Code Status: Full code Family Communication: Husband at bedside Disposition Plan: To be determined Consults called: None Admission status: Inpatient admission   Ankit Arsenio Loader MD [facility] Pager [phone]  If 7PM-7AM, please contact night-coverage [email or web address] Password [facility]  10/16/2017, 2:47 PM

## 2017-10-16 NOTE — Progress Notes (Signed)
[facility]  Patient Status: Active (receiving services up to time of hospitalization)  [facility] is providing the following services: RN and PT  If patient discharges after hours, please call [phone].   Marissa Wilkerson 10/16/2017, 4:50 PM

## 2017-10-16 NOTE — ED Triage Notes (Signed)
Pt c/o chest pain since 10am, chronic SOB, pt given by EMS 2 NTG SL and 324mg  ASA. Pain decreased from 6/10 to 0/10 with Nitro. Pt c/o nausea to EMS and given 4mg  Zofran.

## 2017-10-17 DIAGNOSIS — R112 Nausea with vomiting, unspecified: Secondary | ICD-10-CM

## 2017-10-17 LAB — BASIC METABOLIC PANEL
ANION GAP: 15 (ref 5–15)
BUN: 100 mg/dL — ABNORMAL HIGH (ref 6–20)
CHLORIDE: 95 mmol/L — AB (ref 101–111)
CO2: 26 mmol/L (ref 22–32)
Calcium: 8.8 mg/dL — ABNORMAL LOW (ref 8.9–10.3)
Creatinine, Ser: 2.36 mg/dL — ABNORMAL HIGH (ref 0.44–1.00)
GFR calc Af Amer: 26 mL/min — ABNORMAL LOW (ref >60)
GFR calc non Af Amer: 22 mL/min — ABNORMAL LOW (ref >60)
GLUCOSE: 176 mg/dL — AB (ref 65–99)
POTASSIUM: 3.3 mmol/L — AB (ref 3.5–5.1)
Sodium: 136 mmol/L (ref 135–145)

## 2017-10-17 LAB — GLUCOSE, CAPILLARY
GLUCOSE-CAPILLARY: 239 mg/dL — AB (ref 65–99)
Glucose-Capillary: 197 mg/dL — ABNORMAL HIGH (ref 65–99)
Glucose-Capillary: 195 mg/dL — ABNORMAL HIGH (ref 65–99)
Glucose-Capillary: 210 mg/dL — ABNORMAL HIGH (ref 65–99)

## 2017-10-17 LAB — MAGNESIUM: Magnesium: 2.3 mg/dL (ref 1.7–2.4)

## 2017-10-17 LAB — TROPONIN I: Troponin I: 0.03 ng/mL (ref <0.03)

## 2017-10-17 MED ORDER — ONDANSETRON HCL 4 MG/2ML IJ SOLN
4.0000 mg | Freq: Once | INTRAMUSCULAR | Status: AC
  Administered 2017-10-17: 4 mg via INTRAVENOUS
  Filled 2017-10-17: qty 2

## 2017-10-17 MED ORDER — ONDANSETRON HCL 4 MG/2ML IJ SOLN
4.0000 mg | Freq: Four times a day (QID) | INTRAMUSCULAR | Status: DC | PRN
  Administered 2017-10-17 – 2017-10-19 (×3): 4 mg via INTRAVENOUS
  Filled 2017-10-17 (×4): qty 2

## 2017-10-17 MED ORDER — PANTOPRAZOLE SODIUM 40 MG IV SOLR
40.0000 mg | Freq: Every day | INTRAVENOUS | Status: AC
  Administered 2017-10-17 – 2017-10-18 (×2): 40 mg via INTRAVENOUS
  Filled 2017-10-17 (×2): qty 40

## 2017-10-17 MED ORDER — PROMETHAZINE HCL 25 MG PO TABS
25.0000 mg | ORAL_TABLET | ORAL | Status: DC | PRN
  Administered 2017-10-18: 25 mg via ORAL
  Filled 2017-10-17: qty 1

## 2017-10-17 MED ORDER — FAMOTIDINE 20 MG PO TABS
20.0000 mg | ORAL_TABLET | Freq: Two times a day (BID) | ORAL | Status: DC | PRN
  Administered 2017-10-17: 20 mg via ORAL
  Filled 2017-10-17 (×2): qty 1

## 2017-10-17 MED ORDER — POTASSIUM CHLORIDE CRYS ER 20 MEQ PO TBCR
40.0000 meq | EXTENDED_RELEASE_TABLET | Freq: Once | ORAL | Status: AC
  Administered 2017-10-17: 40 meq via ORAL
  Filled 2017-10-17: qty 2

## 2017-10-17 MED ORDER — SODIUM CHLORIDE 0.9 % IV SOLN
INTRAVENOUS | Status: DC
  Administered 2017-10-17 – 2017-10-19 (×3): via INTRAVENOUS

## 2017-10-17 NOTE — Progress Notes (Addendum)
PROGRESS NOTE    Marissa Wilkerson  GYJ:[id number] DOB: Sep 09, 1960 DOA: 10/16/2017 PCP: Jani Gravel, MD   Brief Narrative: Marissa Wilkerson is a 57 y.o. female with medical history significant of uncontrolled diabetes mellitus type 2, hypertension, sarcoidosis, anemia of chronic disease comes to the hospital with complaints of chest discomfort.  Patient states since her discharge from the hospital about a she was doing well at first last week developed some URI type of symptoms which improved and then 2 days ago she started having nausea with an episode of loose diarrhea and vomiting. Patient was found to be dehydrated on presentation.  Creatinine was elevated to 2.41 .Her baseline creatinine is 1.  Patient was admitted for the management of acute kidney injury.    Assessment & Plan:   Principal Problem:   AKI (acute kidney injury) ([place]) Active Problems:   Hypertension   Diabetes mellitus without complication ([place])   Diastolic dysfunction   Sarcoidosis of lung (HCC)   Nausea and vomiting  Acute kidney injury: Creatinine 2.36 today.  Her baseline creatinine is around 1. Patient also has nausea and vomiting.  Acute kidney injury is most likely secondary to dehydration. Started on gentle IV fluids.  Patient's torsemide on hold. We will continue to monitor kidney function.  Avoid nephrotoxic drugs. We will be cautious for fluid overload, she has history of CHF.  Nausea and vomiting: Unknown etiology.  We will continue Protonix and Zofran.  Denies any abdominal pain.  Had an episode of diarrhea.  Abdominal x-ray did not show any acute intra-abdominal pathology. Will check GI pathogen panel.  Atypical chest pain:  Troponins borderline positive.  Patient does not have any chest pain at present.  EKG did not show any ST changes..  Diabetes mellitus type 2: Continue sliding-scale insulin and Lantus.  History of sarcoidosis/restrictive lung disease: Currently stable.On oxygen at home on baseline.   Follows with pulmonology.  Anemia of chronic disease: Currently H&H stable.  We will continue to monitor  Hypertension: Continue current meds.  Currently blood pressure stable.  Hypokalemia: On supplementation     DVT prophylaxis: [place] heparin Code Status: Full Family Communication: Husband at the bedside Disposition Plan: Home in 1-2 days   Consultants: None  Procedures: None  Antimicrobials: None  Subjective:  Patient seen and examined the bedside this morning.  Was complaining of nausea and vomiting.  Denies any abdominal pain.  Not having diarrhea at present.   Objective: Vitals:   10/16/17 2009 10/16/17 2152 10/17/17 0347 10/17/17 0751  BP: 120/62 (!) 148/55  (!) 162/55  Pulse: 84 86  82  Resp: 12 18  16   Temp:  99.1 F (37.3 C)  98.5 F (36.9 C)  TempSrc:  Oral  Oral  SpO2: 100% 98%  95%  Weight:   113.4 kg (250 lb)   Height:  5\' 3"  (1.6 m)      Intake/Output Summary (Last 24 hours) at 10/17/2017 1234 Last data filed at 10/17/2017 0600 Gross per 24 hour  Intake 1180 ml  Output -  Net 1180 ml   Filed Weights   10/16/17 1218 10/17/17 0347  Weight: 113.4 kg (250 lb) 113.4 kg (250 lb)    Examination:  General exam: Appears calm and comfortable ,Not in distress, morbidly obese  HEENT:PERRL,Oral mucosa moist, Ear/Nose normal on gross exam Respiratory system: Bilateral equal air entry, normal vesicular breath sounds, no wheezes or crackles  Cardiovascular system: S1 & S2 heard, RRR. No JVD, murmurs, rubs, gallops or  clicks. No pedal edema. Gastrointestinal system: Abdomen is nondistended, soft and nontender. No organomegaly or masses felt. Normal bowel sounds heard. Central nervous system: Alert and oriented. No focal neurological deficits. Extremities: No edema, no clubbing ,no cyanosis, distal peripheral pulses palpable. Skin: No rashes, lesions or ulcers,no icterus ,no pallor MSK: Normal muscle bulk,tone ,power Psychiatry: Judgement and insight appear  normal. Mood & affect appropriate.     Data Reviewed: I have personally reviewed following labs and imaging studies  CBC: Recent Labs  Lab 10/16/17 1210 10/16/17 2204  WBC 7.5 7.5  HGB 11.7* 11.8*  HCT 35.0* 36.3  MCV 93.3 93.6  PLT 225 248   Basic Metabolic Panel: Recent Labs  Lab 10/16/17 1210 10/16/17 2204 10/17/17 0225  NA 135  --  136  K 3.1*  --  3.3*  CL 90*  --  95*  CO2 27  --  26  GLUCOSE 249*  --  176*  BUN 94*  --  100*  CREATININE 2.41* 2.42* 2.36*  CALCIUM 9.1  --  8.8*  MG  --   --  2.3   GFR: Estimated Creatinine Clearance: 32.7 mL/min (A) (by C-G formula based on SCr of 2.36 mg/dL (H)). Liver Function Tests: Recent Labs  Lab 10/16/17 1210  AST 24  ALT 19  ALKPHOS 107  BILITOT 0.6  PROT 8.3*  ALBUMIN 3.6   Recent Labs  Lab 10/16/17 1210  LIPASE 38   No results for input(s): AMMONIA in the last 168 hours. Coagulation Profile: No results for input(s): INR, PROTIME in the last 168 hours. Cardiac Enzymes: Recent Labs  Lab 10/16/17 2204 10/17/17 0225  TROPONINI 0.03* 0.03*   BNP (last 3 results) No results for input(s): PROBNP in the last 8760 hours. HbA1C: No results for input(s): HGBA1C in the last 72 hours. CBG: Recent Labs  Lab 10/16/17 1731 10/16/17 2206 10/17/17 0748 10/17/17 1155  GLUCAP 183* 148* 239* 197*   Lipid Profile: No results for input(s): CHOL, HDL, LDLCALC, TRIG, CHOLHDL, LDLDIRECT in the last 72 hours. Thyroid Function Tests: No results for input(s): TSH, T4TOTAL, FREET4, T3FREE, THYROIDAB in the last 72 hours. Anemia Panel: No results for input(s): VITAMINB12, FOLATE, FERRITIN, TIBC, IRON, RETICCTPCT in the last 72 hours. Sepsis Labs: No results for input(s): PROCALCITON, LATICACIDVEN in the last 168 hours.  No results found for this or any previous visit (from the past 240 hour(s)).       Radiology Studies: Dg Chest 2 View  Result Date: 10/16/2017 CLINICAL DATA:  Chest pain and chronic  shortness of breath. History of sarcoidosis. EXAM: CHEST  2 VIEW COMPARISON:  Chest x-ray dated September 15, 2017. FINDINGS: Stable cardiomegaly and mild pulmonary vascular congestion. No focal consolidation, pleural effusion, or pneumothorax. No acute osseous abnormality. IMPRESSION: Stable cardiomegaly and mild vascular congestion. Electronically Signed   By: Titus Dubin M.D.   On: 10/16/2017 12:45   Dg Abd 1 View  Result Date: 10/16/2017 CLINICAL DATA:  Nausea and vomiting for several days EXAM: ABDOMEN - 1 VIEW COMPARISON:  None. FINDINGS: Scattered large and small bowel gas is noted. No free air is noted. No abnormal mass or abnormal calcifications are seen. No acute bony abnormality is noted. IMPRESSION: No acute abnormality noted. Electronically Signed   By: Inez Catalina M.D.   On: 10/16/2017 16:09        Scheduled Meds: . aspirin  325 mg Oral Daily  . atorvastatin  20 mg Oral Daily  . carvedilol  6.25 mg  Oral BID WC  . fluticasone  1 spray Each Nare Daily  . heparin  5,000 Units Subcutaneous Q8H  . insulin aspart  0-15 Units Subcutaneous TID WC  . insulin aspart  0-5 Units Subcutaneous QHS  . insulin glargine  15 Units Subcutaneous BID  . linagliptin  5 mg Oral Daily  . pantoprazole (PROTONIX) IV  40 mg Intravenous Daily  . phenytoin  200 mg Oral BID  . potassium chloride SA  20 mEq Oral Daily  . potassium chloride  40 mEq Oral Once   Continuous Infusions: . sodium chloride 75 mL/hr at 10/17/17 1129     LOS: 1 day    Time spent: 25 mins.More than 50% of that time was spent in counseling and/or coordination of care.      Marene Lenz, MD [facility] Pager [phone]  If 7PM-7AM, please contact night-coverage [email or web address] Password [facility] 10/17/2017, 12:34 PM

## 2017-10-18 ENCOUNTER — Inpatient Hospital Stay: Payer: Medicare Other | Admitting: Adult Health

## 2017-10-18 LAB — BASIC METABOLIC PANEL
Anion gap: 11 (ref 5–15)
BUN: 91 mg/dL — AB (ref 6–20)
CO2: 27 mmol/L (ref 22–32)
Calcium: 8.8 mg/dL — ABNORMAL LOW (ref 8.9–10.3)
Chloride: 101 mmol/L (ref 101–111)
Creatinine, Ser: 1.87 mg/dL — ABNORMAL HIGH (ref 0.44–1.00)
GFR calc Af Amer: 34 mL/min — ABNORMAL LOW (ref >60)
GFR, EST NON AFRICAN AMERICAN: 29 mL/min — AB (ref >60)
GLUCOSE: 127 mg/dL — AB (ref 65–99)
POTASSIUM: 3.4 mmol/L — AB (ref 3.5–5.1)
SODIUM: 139 mmol/L (ref 135–145)

## 2017-10-18 LAB — GLUCOSE, CAPILLARY
GLUCOSE-CAPILLARY: 116 mg/dL — AB (ref 65–99)
GLUCOSE-CAPILLARY: 152 mg/dL — AB (ref 65–99)
Glucose-Capillary: 165 mg/dL — ABNORMAL HIGH (ref 65–99)
Glucose-Capillary: 163 mg/dL — ABNORMAL HIGH (ref 65–99)

## 2017-10-18 MED ORDER — POTASSIUM CHLORIDE CRYS ER 20 MEQ PO TBCR
40.0000 meq | EXTENDED_RELEASE_TABLET | Freq: Once | ORAL | Status: AC
  Administered 2017-10-18: 40 meq via ORAL
  Filled 2017-10-18: qty 2

## 2017-10-18 MED ORDER — PANTOPRAZOLE SODIUM 40 MG PO TBEC
40.0000 mg | DELAYED_RELEASE_TABLET | Freq: Every day | ORAL | Status: DC
  Administered 2017-10-19: 40 mg via ORAL
  Filled 2017-10-18: qty 1

## 2017-10-18 NOTE — Progress Notes (Signed)
PROGRESS NOTE    Marissa Wilkerson  OXB:[id number] DOB: [birth year] DOA: 10/16/2017 PCP: Jani Gravel, MD   Brief Narrative: Patient is a 57 y.o. female with medical history significant of uncontrolled diabetes mellitus type 2, hypertension, sarcoidosis, anemia of chronic disease who comes to the hospital with complaints of chest discomfort.  Patient states since her discharge from the hospital last time after management of CHF exacerbation.She was doing well at first .She had  developed some URI type of symptoms which improved and then 2 days ago prior to admission, she started having nausea with an episode of loose diarrhea and also vomiting. Patient was found to be dehydrated on presentation.  Creatinine was elevated to 2.41 .Her baseline creatinine is 1.  Patient was admitted for the management of acute kidney injury.    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  Her baseline creatinine is around 1. Patient presented with  nausea and vomiting.  These symptoms have improved. Acute kidney injury is most likely secondary to dehydration.She was also on diuretics( torsemide and metalazone) at home. Started on gentle IV fluids.  Patient's torsemide on hold. We will continue to monitor kidney function.  Avoid nephrotoxic drugs. We will be cautious for fluid overload, she has history of CHF. She also follows with nephrology as an outpatient.We might need to consider for reduction of diuretics doses on discharge.  H/O CHF: Currently her CHF iis compensated.  She is on high dose of torsemide at home which is on hold.  Continue other meds.  Nausea and vomiting: Resolved.Unknown etiology.  We will continue Protonix and Zofran.  Denies any abdominal pain.  Had an episode of  diarrhea.  Abdominal x-ray did not show any acute intra-abdominal pathology. Sent GI pathogen panel, but she did not have a bowel movement since yesterday.  Atypical chest pain:  Resolved.Troponins borderline positive.  Patient does not have any chest pain at present.  EKG did not show any ST changes..  Diabetes mellitus type 2: Continue sliding-scale insulin and Lantus.  History of sarcoidosis/restrictive lung disease: Currently stable.On oxygen at home on baseline.  Follows with pulmonology.  Anemia of chronic disease: Currently H&H stable.  We will continue to monitor  Hypertension: Continue current meds.  Currently blood pressure stable.  Hypokalemia: On supplementation.Will check levels tomorrow     DVT prophylaxis: [place] heparin Code Status: Full Family Communication: None present at the bedside Disposition Plan: Home likely tomorrow depending upon improvement in the kidney function   Consultants: None  Procedures: None  Antimicrobials: None  Subjective:  Patient seen and examined the bedside this morning.  Remains comfortable.  Nausea and vomiting has improved.  No nausea or vomiting since yesterday.  Objective: Vitals:   10/17/17 2229 10/18/17 0510 10/18/17 0949 10/18/17 1222  BP: 136/62 131/86 (!) 157/95 (!) 143/84  Pulse: 98 (!) 104 77 74  Resp: 16 16 16 18   Temp: 98.3 F (36.8 C) 98.1 F (36.7 C) 98.7 F (37.1 C) 98.7 F (37.1 C)  TempSrc: Oral Oral Oral Oral  SpO2: 98% 100% 100% 99%  Weight:  114.1 kg (251 lb 8 oz)    Height:        Intake/Output Summary (Last 24 hours) at 10/18/2017 1451 Last data filed at 10/18/2017  1610 Gross per 24 hour  Intake 1395 ml  Output 700 ml  Net 695 ml   Filed Weights   10/16/17 1218 10/17/17 0347 10/18/17 0510  Weight: 113.4 kg (250 lb) 113.4 kg (250 lb) 114.1 kg (251 lb 8 oz)    Examination:  General exam: Appears calm and comfortable ,Not in distress, morbidly obese HEENT:PERRL,Oral mucosa moist, Ear/Nose  normal on gross exam Respiratory system: Bilateral equal air entry, normal vesicular breath sounds, no wheezes or crackles  Cardiovascular system: S1 & S2 heard, RRR. No JVD, murmurs, rubs, gallops or clicks. Gastrointestinal system: Abdomen is nondistended, soft and nontender. No organomegaly or masses felt. Normal bowel sounds heard. Central nervous system: Alert and oriented. No focal neurological deficits. Extremities: No edema, no clubbing ,no cyanosis, distal peripheral pulses palpable. Skin: No rashes, lesions or ulcers,no icterus ,no pallor MSK: Normal muscle bulk,tone ,power Psychiatry: Judgement and insight appear normal. Mood & affect appropriate.      Data Reviewed: I have personally reviewed following labs and imaging studies  CBC: Recent Labs  Lab 10/16/17 1210 10/16/17 2204  WBC 7.5 7.5  HGB 11.7* 11.8*  HCT 35.0* 36.3  MCV 93.3 93.6  PLT 225 960   Basic Metabolic Panel: Recent Labs  Lab 10/16/17 1210 10/16/17 2204 10/17/17 0225 10/18/17 0440  NA 135  --  136 139  K 3.1*  --  3.3* 3.4*  CL 90*  --  95* 101  CO2 27  --  26 27  GLUCOSE 249*  --  176* 127*  BUN 94*  --  100* 91*  CREATININE 2.41* 2.42* 2.36* 1.87*  CALCIUM 9.1  --  8.8* 8.8*  MG  --   --  2.3  --    GFR: Estimated Creatinine Clearance: 41.4 mL/min (A) (by C-G formula based on SCr of 1.87 mg/dL (H)). Liver Function Tests: Recent Labs  Lab 10/16/17 1210  AST 24  ALT 19  ALKPHOS 107  BILITOT 0.6  PROT 8.3*  ALBUMIN 3.6   Recent Labs  Lab 10/16/17 1210  LIPASE 38   No results for input(s): AMMONIA in the last 168 hours. Coagulation Profile: No results for input(s): INR, PROTIME in the last 168 hours. Cardiac Enzymes: Recent Labs  Lab 10/16/17 2204 10/17/17 0225  TROPONINI 0.03* 0.03*   BNP (last 3 results) No results for input(s): PROBNP in the last 8760 hours. HbA1C: No results for input(s): HGBA1C in the last 72 hours. CBG: Recent Labs  Lab 10/17/17 1155  10/17/17 1735 10/17/17 2215 10/18/17 0936 10/18/17 1147  GLUCAP 197* 195* 210* 116* 165*   Lipid Profile: No results for input(s): CHOL, HDL, LDLCALC, TRIG, CHOLHDL, LDLDIRECT in the last 72 hours. Thyroid Function Tests: No results for input(s): TSH, T4TOTAL, FREET4, T3FREE, THYROIDAB in the last 72 hours. Anemia Panel: No results for input(s): VITAMINB12, FOLATE, FERRITIN, TIBC, IRON, RETICCTPCT in the last 72 hours. Sepsis Labs: No results for input(s): PROCALCITON, LATICACIDVEN in the last 168 hours.  No results found for this or any previous visit (from the past 240 hour(s)).       Radiology Studies: Dg Abd 1 View  Result Date: 10/16/2017 CLINICAL DATA:  Nausea and vomiting for several days EXAM: ABDOMEN - 1 VIEW COMPARISON:  None. FINDINGS: Scattered large and small bowel gas is noted. No free air is noted. No abnormal mass or abnormal calcifications are seen. No acute bony abnormality is noted. IMPRESSION: No acute abnormality noted. Electronically Signed   By: Linus Mako.D.  On: 10/16/2017 16:09        Scheduled Meds: . atorvastatin  20 mg Oral Daily  . carvedilol  6.25 mg Oral BID WC  . fluticasone  1 spray Each Nare Daily  . heparin  5,000 Units Subcutaneous Q8H  . insulin aspart  0-15 Units Subcutaneous TID WC  . insulin aspart  0-5 Units Subcutaneous QHS  . insulin glargine  15 Units Subcutaneous BID  . linagliptin  5 mg Oral Daily  . [START ON 10/19/2017] pantoprazole  40 mg Oral Daily  . phenytoin  200 mg Oral BID  . potassium chloride SA  20 mEq Oral Daily   Continuous Infusions: . sodium chloride 75 mL/hr at 10/18/17 1211     LOS: 2 days    Time spent: 25 mins.More than 50% of that time was spent in counseling and/or coordination of care.      Marene Lenz, MD [facility] Pager [phone]  If 7PM-7AM, please contact night-coverage [email or web address] Password [facility] 10/18/2017, 2:51 PM

## 2017-10-18 NOTE — Progress Notes (Signed)
Pharmacy IV to PO conversion  The patient is receiving Protonix by the intravenous route.  Based on criteria approved by the Pharmacy and [place], the medication is being converted to the equivalent oral dose form starting tomorrow 2/22 (if still admitted)   No active GI bleeding or impaired absorption  Not s/p esophagectomy  Documented ability to take oral medications for > 24 hr  Plan to continue treatment for at least 1 day  If you have any questions about this conversion, please contact the Pharmacy Department (ext [phone]).  Thank you.  Reuel Boom, PharmD, BCPS [phone] 10/18/2017, 10:10 AM

## 2017-10-19 DIAGNOSIS — E119 Type 2 diabetes mellitus without complications: Secondary | ICD-10-CM

## 2017-10-19 DIAGNOSIS — N179 Acute kidney failure, unspecified: Principal | ICD-10-CM

## 2017-10-19 DIAGNOSIS — D860 Sarcoidosis of lung: Secondary | ICD-10-CM

## 2017-10-19 DIAGNOSIS — I10 Essential (primary) hypertension: Secondary | ICD-10-CM

## 2017-10-19 DIAGNOSIS — R112 Nausea with vomiting, unspecified: Secondary | ICD-10-CM

## 2017-10-19 DIAGNOSIS — R0789 Other chest pain: Secondary | ICD-10-CM

## 2017-10-19 LAB — BASIC METABOLIC PANEL
Anion gap: 9 (ref 5–15)
BUN: 60 mg/dL — AB (ref 6–20)
CALCIUM: 8.7 mg/dL — AB (ref 8.9–10.3)
CO2: 24 mmol/L (ref 22–32)
CREATININE: 1.2 mg/dL — AB (ref 0.44–1.00)
Chloride: 106 mmol/L (ref 101–111)
GFR calc Af Amer: 58 mL/min — ABNORMAL LOW (ref >60)
GFR, EST NON AFRICAN AMERICAN: 50 mL/min — AB (ref >60)
GLUCOSE: 172 mg/dL — AB (ref 65–99)
Potassium: 4.2 mmol/L (ref 3.5–5.1)
SODIUM: 139 mmol/L (ref 135–145)

## 2017-10-19 LAB — GLUCOSE, CAPILLARY
GLUCOSE-CAPILLARY: 143 mg/dL — AB (ref 65–99)
Glucose-Capillary: 135 mg/dL — ABNORMAL HIGH (ref 65–99)

## 2017-10-19 NOTE — Discharge Summary (Addendum)
Physician Discharge Summary  Marissa Wilkerson XBD:[id number] DOB: 1961-08-18 DOA: 10/16/2017  PCP: Jani Gravel, MD  Admit date: 10/16/2017 Discharge date: 10/19/2017  Admitted From: Home Disposition:  Home  Recommendations for Outpatient Follow-up:  1. Follow up with PCP in 1 week with repeat BMP 2. Follow-up in nephrology/Dr. Lorrene Reid at earliest [place]: Yes Equipment/Devices: Oxygen by nasal cannula to 2-3 L/m Discharge Condition: Stable  CODE STATUS: Full  Diet recommendation: Heart Healthy / Carb Modified   Brief/Interim Summary: 57 year old female with history of uncontrolled diabetes mellitus type 2, hypertension, psychosis, anemia of chronic disease presented with complaints of chest discomfort. Patient was found to be dehydrated on presentation. Creatinine was elevated to 2.41 are baseline creatinine is 1. She was admitted for management of acute kidney injury. IV fluids was started. Her creatinine has improved. She is stable to be discharged with outpatient follow-up with nephrology at earliest convenience. Diuretics will remain on hold for now.   Discharge Diagnoses:  Principal Problem:   AKI (acute kidney injury) ([place]) Active Problems:   Hypertension   Diabetes mellitus without complication ([place])   Diastolic dysfunction   Sarcoidosis of lung (HCC)   Nausea and vomiting  Acute kidney injury:  -Patient presented with  nausea and vomiting.  These symptoms have improved. Acute kidney injury is most likely secondary to dehydration.She was also on diuretics( torsemide and metalazone) at home. -Creatinine 1.2 today.  Her baseline creatinine is around 1. Patient was treated with intravenous fluids and holding diuretics. - Patient will follow-up with Dr. Dunham/nephrology at earliest convenience with repeat BMP. Discussed with Dr. Schertz/nephrology and he agrees that diuretics will remain on hold till further evaluation with Dr. Lorrene Reid.  H/O diastolic CHF:  Currently her CHF iis compensated.   diuretics plan as above Outpatient follow-up with primary care provider and/or cardiology along with nephrology.  Nausea and vomiting: Resolved.Unknown etiology.   Atypical chest pain:  Resolved.Troponins borderline positive.  Patient does not have any chest pain at present.  EKG did not show any ST changes. Outpatient follow-up with cardiology  Diabetes mellitus type 2: Continue  Lantus.  History of sarcoidosis/restrictive lung disease with chronic hypoxic respiratory failure: Currently stable.On oxygen at home on baseline.  Follows with pulmonology.  Anemia of chronic disease: Hemoglobin stable. Outpatient follow-up  Hypertension: Continue current meds.  Currently blood pressure stable. Hold diuretics  Hypokalemia: Resolved. Hold outpatient potassium supplementation while patient is off diuretics     Discharge Instructions  Discharge Instructions    Call MD for:  difficulty breathing, headache or visual disturbances   Complete by:  As directed    Call MD for:  extreme fatigue   Complete by:  As directed    Call MD for:  hives   Complete by:  As directed    Call MD for:  persistant dizziness or light-headedness   Complete by:  As directed    Call MD for:  persistant nausea and vomiting   Complete by:  As directed    Call MD for:  severe uncontrolled pain   Complete by:  As directed    Call MD for:  temperature >100.4   Complete by:  As directed    Diet - low sodium heart healthy   Complete by:  As directed    Diet Carb Modified   Complete by:  As directed    Increase activity slowly   Complete by:  As directed      Allergies as of 10/19/2017  Reactions   Cymbalta [duloxetine Hcl] Other (See Comments)   Hallucinations    Fentanyl Other (See Comments)   Patches. Blistered skin.    Metformin And Related Nausea And Vomiting   Mobic [meloxicam] Nausea And Vomiting   Sulfa Antibiotics Rash   Voltaren [diclofenac] Nausea  And Vomiting      Medication List    STOP taking these medications   METANX 3-90.314-2-35 MG Caps   metolazone 2.5 MG tablet Commonly known as:  ZAROXOLYN   mouth rinse Liqd solution   potassium chloride SA 20 MEQ tablet Commonly known as:  K-DUR,KLOR-CON   torsemide 100 MG tablet Commonly known as:  DEMADEX     TAKE these medications   albuterol 108 (90 Base) MCG/ACT inhaler Commonly known as:  PROVENTIL HFA;VENTOLIN HFA Inhale 2 puffs into the lungs every 6 (six) hours as needed for wheezing or shortness of breath.   atorvastatin 20 MG tablet Commonly known as:  LIPITOR Take 1 tablet (20 mg total) by mouth daily.   carvedilol 6.25 MG tablet Commonly known as:  COREG Take 1 tablet (6.25 mg total) by mouth 2 (two) times daily with a meal.   fluticasone 50 MCG/ACT nasal spray Commonly known as:  FLONASE Place 1 spray into both nostrils daily.   gabapentin 300 MG capsule Commonly known as:  NEURONTIN Take 2 capsules (600 mg total) by mouth 3 (three) times daily. Take 300mg  in AM and 600mg  in PM   HYDROcodone-acetaminophen 7.5-325 MG tablet Commonly known as:  NORCO Take 1 tablet by mouth every 6 (six) hours as needed for moderate pain.   insulin aspart 100 UNIT/ML injection Commonly known as:  novoLOG Inject 15-25 Units into the skin 2 (two) times daily before a meal. Sliding scale   Insulin Glargine 300 UNIT/ML Sopn Commonly known as:  TOUJEO SOLOSTAR Inject 25 Units into the skin 2 (two) times daily.   phenytoin 200 MG ER capsule Commonly known as:  DILANTIN Take 1 capsule (200 mg total) by mouth 2 (two) times daily.   promethazine 25 MG tablet Commonly known as:  PHENERGAN Take 1 tablet (25 mg total) by mouth every 4 (four) hours as needed for nausea or vomiting.   sitaGLIPtin 100 MG tablet Commonly known as:  JANUVIA Take 1 tablet (100 mg total) by mouth daily.   tiZANidine 4 MG tablet Commonly known as:  ZANAFLEX Take 1 tablet (4 mg total) by  mouth every 8 (eight) hours as needed for muscle spasms.       Follow-up Information    Jani Gravel, MD. Schedule an appointment as soon as possible for a visit in 1 week(s).   Specialty:  Internal Medicine Why:  with repeat BMP Contact information: [address] [place] [address] [phone]        Jamal Maes, MD. Schedule an appointment as soon as possible for a visit.   Specialty:  Nephrology Why:  at earliest convenience Contact information: [facility] [place] [address] [phone]          Allergies  Allergen Reactions  . Cymbalta [Duloxetine Hcl] Other (See Comments)    Hallucinations   . Fentanyl Other (See Comments)    Patches. Blistered skin.   . Metformin And Related Nausea And Vomiting  . Mobic [Meloxicam] Nausea And Vomiting  . Sulfa Antibiotics Rash  . Voltaren [Diclofenac] Nausea And Vomiting    Consultations:  None. Spoke to Dr. Schertz/Nephrology on phone   Procedures/Studies: Dg Chest 2 View  Result Date: 10/16/2017 CLINICAL DATA:  Chest pain and chronic shortness of breath. History of sarcoidosis. EXAM: CHEST  2 VIEW COMPARISON:  Chest x-ray dated September 15, 2017. FINDINGS: Stable cardiomegaly and mild pulmonary vascular congestion. No focal consolidation, pleural effusion, or pneumothorax. No acute osseous abnormality. IMPRESSION: Stable cardiomegaly and mild vascular congestion. Electronically Signed   By: Titus Dubin M.D.   On: 10/16/2017 12:45   Dg Abd 1 View  Result Date: 10/16/2017 CLINICAL DATA:  Nausea and vomiting for several days EXAM: ABDOMEN - 1 VIEW COMPARISON:  None. FINDINGS: Scattered large and small bowel gas is noted. No free air is noted. No abnormal mass or abnormal calcifications are seen. No acute bony abnormality is noted. IMPRESSION: No acute abnormality noted. Electronically Signed   By: Inez Catalina M.D.   On: 10/16/2017 16:09   US Renal  Result Date: 09/20/2017 CLINICAL DATA:   Renal failure EXAM: RENAL / URINARY TRACT ULTRASOUND COMPLETE COMPARISON:  None. FINDINGS: Right Kidney: Length: 14.6 cm. Echogenicity within normal limits. No mass or hydronephrosis visualized. Left Kidney: Length: 13.3 cm. Echogenicity within normal limits. No mass or hydronephrosis visualized. Bladder: Decompressed IMPRESSION: No acute renal abnormality is noted. Electronically Signed   By: Inez Catalina M.D.   On: 09/20/2017 19:12      Subjective: Patient seen and examined at bedside. She denies any overnight fever, nausea or vomiting. She feels better and wants to go home  Discharge Exam: Vitals:   10/18/17 2108 10/19/17 0437  BP: (!) 160/66 (!) 164/80  Pulse: 79 76  Resp: 18 20  Temp: 99.4 F (37.4 C) 98.1 F (36.7 C)  SpO2: 99% 94%   Vitals:   10/18/17 0949 10/18/17 1222 10/18/17 2108 10/19/17 0437  BP: (!) 157/95 (!) 143/84 (!) 160/66 (!) 164/80  Pulse: 77 74 79 76  Resp: 16 18 18 20   Temp: 98.7 F (37.1 C) 98.7 F (37.1 C) 99.4 F (37.4 C) 98.1 F (36.7 C)  TempSrc: Oral Oral Oral Oral  SpO2: 100% 99% 99% 94%  Weight:    115.6 kg (254 lb 12.8 oz)  Height:        General: Pt is alert, awake, not in acute distress Cardiovascular: Rate controlled, S1/S2 + Respiratory: Bilateral decreased breath sounds at bases Abdominal: Soft, NT, ND, bowel sounds + Extremities: Trace edema, no cyanosis    The results of significant diagnostics from this hospitalization (including imaging, microbiology, ancillary and laboratory) are listed below for reference.     Microbiology: No results found for this or any previous visit (from the past 240 hour(s)).   Labs: BNP (last 3 results) Recent Labs    09/15/17 1844 10/16/17 1210  BNP 299.7* 61.4   Basic Metabolic Panel: Recent Labs  Lab 10/16/17 1210 10/16/17 2204 10/17/17 0225 10/18/17 0440 10/19/17 0414  NA 135  --  136 139 139  K 3.1*  --  3.3* 3.4* 4.2  CL 90*  --  95* 101 106  CO2 27  --  26 27 24   GLUCOSE  249*  --  176* 127* 172*  BUN 94*  --  100* 91* 60*  CREATININE 2.41* 2.42* 2.36* 1.87* 1.20*  CALCIUM 9.1  --  8.8* 8.8* 8.7*  MG  --   --  2.3  --   --    Liver Function Tests: Recent Labs  Lab 10/16/17 1210  AST 24  ALT 19  ALKPHOS 107  BILITOT 0.6  PROT 8.3*  ALBUMIN 3.6  Recent Labs  Lab 10/16/17 1210  LIPASE 38   No results for input(s): AMMONIA in the last 168 hours. CBC: Recent Labs  Lab 10/16/17 1210 10/16/17 2204  WBC 7.5 7.5  HGB 11.7* 11.8*  HCT 35.0* 36.3  MCV 93.3 93.6  PLT 225 233   Cardiac Enzymes: Recent Labs  Lab 10/16/17 2204 10/17/17 0225  TROPONINI 0.03* 0.03*   BNP: Invalid input(s): POCBNP CBG: Recent Labs  Lab 10/18/17 0936 10/18/17 1147 10/18/17 1649 10/18/17 2102 10/19/17 0736  GLUCAP 116* 165* 163* 152* 143*   D-Dimer No results for input(s): DDIMER in the last 72 hours. Hgb A1c No results for input(s): HGBA1C in the last 72 hours. Lipid Profile No results for input(s): CHOL, HDL, LDLCALC, TRIG, CHOLHDL, LDLDIRECT in the last 72 hours. Thyroid function studies No results for input(s): TSH, T4TOTAL, T3FREE, THYROIDAB in the last 72 hours.  Invalid input(s): FREET3 Anemia work up No results for input(s): VITAMINB12, FOLATE, FERRITIN, TIBC, IRON, RETICCTPCT in the last 72 hours. Urinalysis    Component Value Date/Time   COLORURINE YELLOW 10/16/2017 2022   APPEARANCEUR HAZY (A) 10/16/2017 2022   LABSPEC 1.018 10/16/2017 2022   PHURINE 5.0 10/16/2017 2022   GLUCOSEU 150 (A) 10/16/2017 2022   HGBUR SMALL (A) 10/16/2017 2022   BILIRUBINUR NEGATIVE 10/16/2017 2022   KETONESUR 20 (A) 10/16/2017 2022   PROTEINUR >=300 (A) 10/16/2017 2022   UROBILINOGEN 1.0 03/03/2014 0942   NITRITE NEGATIVE 10/16/2017 2022   LEUKOCYTESUR MODERATE (A) 10/16/2017 2022   Sepsis Labs Invalid input(s): PROCALCITONIN,  WBC,  LACTICIDVEN Microbiology No results found for this or any previous visit (from the past 240 hour(s)).   Time  coordinating discharge: 35 minutes  SIGNED:   Aline August, MD  [facility] 10/19/2017, 12:10 PM Pager: [phone]  If 7PM-7AM, please contact night-coverage [email or web address] Password [email or web address]

## 2017-10-19 NOTE — Care Management Important Message (Signed)
Important Message  Patient Details  Name: ERNESTENE COOVER MRN: [id number] Date of Birth: 1960/11/23   Medicare Important Message Given:  Yes    Kerin Salen 10/19/2017, 12:21 [place] Message  Patient Details  Name: DIERDRA SALAMEH MRN: [id number] Date of Birth: 12/17/1960   Medicare Important Message Given:  Yes    Kerin Salen 10/19/2017, 12:21 PM

## 2017-10-24 MED FILL — UNIFINE PENTIPS 6MM 31G: 31G X 6 MM | 90 days supply | Qty: 400 | Fill #0

## 2017-10-26 MED FILL — PROMETHAZINE 25 MG TABLET: 25 | 30 days supply | Qty: 60 | Fill #4

## 2017-10-26 MED FILL — HUMALOG 100 UNITS/ML KWIKPE: 100 | 30 days supply | Qty: 15 | Fill #1

## 2017-11-02 ENCOUNTER — Ambulatory Visit: Payer: Medicare Other | Admitting: Emergency Medicine

## 2017-11-02 ENCOUNTER — Encounter: Payer: Self-pay | Admitting: Emergency Medicine

## 2017-11-02 DIAGNOSIS — D860 Sarcoidosis of lung: Secondary | ICD-10-CM

## 2017-11-02 DIAGNOSIS — J9610 Chronic respiratory failure, unspecified whether with hypoxia or hypercapnia: Secondary | ICD-10-CM | POA: Diagnosis not present

## 2017-11-02 DIAGNOSIS — J449 Chronic obstructive pulmonary disease, unspecified: Secondary | ICD-10-CM | POA: Diagnosis not present

## 2017-11-02 MED ORDER — ALBUTEROL SULFATE HFA 108 (90 BASE) MCG/ACT IN AERS: 2.0000 | INHALATION_SPRAY | Freq: Four times a day (QID) | RESPIRATORY_TRACT | 5 refills | Status: DC | PRN

## 2017-11-02 NOTE — Progress Notes (Signed)
Subjective:    Patient ID: Marissa Wilkerson, female    DOB: [birth year], 57 y.o.   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  She has a history of abnormal CT chest and was ultimately diagnosed following biopsies with sarcoidosis.  We had been managing her on methotrexate, stopped this in October 2018.  She was admitted in late January with anasarca, dyspnea and acute renal failure that appeared to be consistent with a nephrotic syndrome, she was aggressively diuresed. CXR 2/19 with cardiomegaly, some mild interstitial prominence. Having some nasal congestion.    Review of Systems  Constitutional: Negative for activity change, fever and unexpected weight change.  HENT: Negative for congestion, dental problem, ear pain, nosebleeds, postnasal drip, rhinorrhea, sinus pressure, sneezing, sore throat and trouble swallowing.   Eyes: Negative for redness and itching.  Respiratory: Positive for shortness of breath. Negative for cough, chest tightness and wheezing.   Cardiovascular: Negative for palpitations and leg swelling.  Gastrointestinal: Positive for vomiting. Negative for nausea.  Genitourinary: Negative for dysuria.  Musculoskeletal: Negative for back pain and joint swelling.  Skin: Negative for rash.  Neurological: Negative for headaches.  Hematological: Does not bruise/bleed easily.  Psychiatric/Behavioral: Negative for dysphoric mood. The patient is not nervous/anxious.        Objective:   Physical Exam Vitals:   11/02/17 1049  BP: 128/74  Pulse: 77  SpO2: 92%  Weight: 282 lb (127.9 kg)  Height: 5\' 3"  (1.6 m)     Gen: Pleasant, overwt woman, in no distress,  normal affect  ENT: No lesions,  mouth clear,  oropharynx clear, no postnasal drip, some cushingoid features  Neck: No JVD, no stridor  Lungs: No use of accessory muscles,  clear without rales or rhonchi, no wheeze  Cardiovascular: RRR, early systolic murmur best heard at the right sternal border, 2+ pitting edema lower extremities  Musculoskeletal: No deformities, no cyanosis or clubbing  Neuro: alert, non focal  Skin: Warm,  No rash                                                                                 Testing:  BAL fungal smear 1/8 >> negative, cx pending BAL AFB smear 1/8 >> negative, cx pending BAL cytology 1/8 >> negative Brushings cytology 1/8 >> negative TBBx's path 1/8 >> negative RF, ANA, ANCA, 1/8 >> all negative  IgE 1/7 >> 55 (normal)  Eosinophil count 1/7 >> 0.4 (normal)  Fungal hypersensitivity panel 1/7 >> all negative Quantiferon gold 1/7 >> negative HIV 1/7 >> negative   CBC    Component Value Date/Time   WBC 7.5 10/16/2017 2204   RBC 3.88 10/16/2017 2204   HGB 11.8 (L) 10/16/2017 2204   HCT 36.3 10/16/2017 2204   PLT 233 10/16/2017 2204   MCV 93.6 10/16/2017 2204   MCH 30.4 10/16/2017 2204   MCHC 32.5 10/16/2017 2204   RDW 15.5 10/16/2017 2204   LYMPHSABS 1.1 09/16/2017 0514   MONOABS 0.8 09/16/2017 0514   EOSABS 0.1 09/16/2017 0514   EOSABS 0.4 09/03/2013 1331   BASOSABS  0.0 09/16/2017 0514   BMP Latest Ref Rng & Units 10/19/2017 10/18/2017 10/17/2017  Glucose 65 - 99 mg/dL 172(H) 127(H) 176(H)  BUN 6 - 20 mg/dL 60(H) 91(H) 100(H)  Creatinine 0.44 - 1.00 mg/dL 1.20(H) 1.87(H) 2.36(H)  Sodium 135 - 145 mmol/L 139 139 136  Potassium 3.5 - 5.1 mmol/L 4.2 3.4(L) 3.3(L)  Chloride 101 - 111 mmol/L 106 101 95(L)  CO2 22 - 32 mmol/L 24 27 26   Calcium 8.9 - 10.3 mg/dL 8.7(L) 8.8(L) 8.8(L)    CT scan 06/13/17 --   COMPARISON:  High-resolution CT chest dated 10/01/2015  FINDINGS: Cardiovascular: Heart is top-normal in size. Trace pericardial fluid.  No evidence of thoracic aortic aneurysm. Mild atherosclerotic calcifications of the aortic root.  Mediastinum/Nodes:  Mild thoracic lymphadenopathy,  including:  --10 mm short axis prevascular node (series 2/ image 49)  --11 mm short axis low right paratracheal node (series 2/image 54)  --12 mm short axis right hilar node (series 2/ image 66)  --13 mm short axis subcarinal node (series 2/image 67)  Visualized thyroid is unremarkable.  Lungs/Pleura: Nodular scarring in the left upper lobe measuring 4 x 10 mm (series 3/ image 34), grossly unchanged.  7 mm subpleural nodule in the left lower lobe (series 3/ image 121), grossly unchanged.  Perifissural nodularity along the minor fissure measuring up to 4 mm.  Scattered 4 mm nodules in the left lower lobe (series 3/images 100, 105, and 108), unchanged.  Platelike scarring/ atelectasis in the lingula with air bronchograms (series 3/ image 85).  Mosaic attenuation in the lungs bilaterally. Mild patchy left basilar opacity, likely atelectasis.  Postsurgical changes related to left lower lobe wedge resection.  No pleural effusion or pneumothorax.  Upper Abdomen: Visualized upper abdomen is notable for mild hepatic steatosis and cholecystectomy clips.  Musculoskeletal: Visualized osseous structures are within normal limits.  IMPRESSION: Stable nodularity in the lungs bilaterally, likely related to known sarcoidosis, unchanged since 2017, benign.  Mild thoracic lymphadenopathy, also likely related to sarcoidosis.  Aortic Atherosclerosis (ICD10-I70.0).    Assessment & Plan:  Sarcoidosis of lung ([place]) Question whether she may have renal involvement of her sarcoidosis.  Her nephrotic syndrome did start after we x-ray.  I do not see any evidence of pulmonary infiltrates or recurrence in her chest.  She may need a renal biopsy before we would commit to putting her back on methotrexate or any alternative.  She is following with Dr. Lorrene Reid and I will speak with her to formulate a plan.  We will refill your albuterol.  Use 2 puffs if needed for shortness of  breath, chest tightness, wheezing. We will need to discuss with Dr. Lorrene Reid the plans to address your immunosuppressive medication in the setting of your newly identified nephrotic renal insufficiency.  Sarcoidosis can affect the kidneys in rare circumstances.  You may benefit from a renal biopsy to establish a definitive diagnosis before we commit you to do suppressive medicines.  If we do ultimately decide you would benefit then methotrexate would be a good choice since you have tolerated in the past. We will not start a scheduled inhaled medication right now.  We may retry this at some point as you may benefit from this in the future Follow with Dr Lamonte Sakai in 1 month or sooner if you have any problems.  Obstructive lung disease Continue albuterol as needed.  She has not benefited in the past from scheduled bronchodilators.  We may decide to reconsider as we go forward  Chronic  respiratory failure ([place]) Continue oxygen as ordered  Baltazar Apo, MD, PhD 11/02/2017, 11:17 AM [facility] [phone] or if no answer [phone]

## 2017-11-02 NOTE — Assessment & Plan Note (Signed)
Continue albuterol as needed.  She has not benefited in the past from scheduled bronchodilators.  We may decide to reconsider as we go forward

## 2017-11-02 NOTE — Patient Instructions (Addendum)
Start your Flonase nasal spray, 2 sprays each nostril every day at least during the allergy season. We will refill your albuterol.  Use 2 puffs if needed for shortness of breath, chest tightness, wheezing. We will need to discuss with Dr. Lorrene Reid the plans to address your immunosuppressive medication in the setting of your newly identified nephrotic renal insufficiency.  Sarcoidosis can affect the kidneys in rare circumstances.  You may benefit from a renal biopsy to establish a definitive diagnosis before we commit you to do suppressive medicines.  If we do ultimately decide you would benefit then methotrexate would be a good choice since you have tolerated in the past. We will not start a scheduled inhaled medication right now.  We may retry this at some point as you may benefit from this in the future Follow with Dr Lamonte Sakai in 1 month or sooner if you have any problems.

## 2017-11-02 NOTE — Assessment & Plan Note (Signed)
Question whether she may have renal involvement of her sarcoidosis.  Her nephrotic syndrome did start after we x-ray.  I do not see any evidence of pulmonary infiltrates or recurrence in her chest.  She may need a renal biopsy before we would commit to putting her back on methotrexate or any alternative.  She is following with Dr. Lorrene Reid and I will speak with her to formulate a plan.  We will refill your albuterol.  Use 2 puffs if needed for shortness of breath, chest tightness, wheezing. We will need to discuss with Dr. Lorrene Reid the plans to address your immunosuppressive medication in the setting of your newly identified nephrotic renal insufficiency.  Sarcoidosis can affect the kidneys in rare circumstances.  You may benefit from a renal biopsy to establish a definitive diagnosis before we commit you to do suppressive medicines.  If we do ultimately decide you would benefit then methotrexate would be a good choice since you have tolerated in the past. We will not start a scheduled inhaled medication right now.  We may retry this at some point as you may benefit from this in the future Follow with Dr Lamonte Sakai in 1 month or sooner if you have any problems.

## 2017-11-02 NOTE — Assessment & Plan Note (Signed)
Continue oxygen as ordered 

## 2017-11-02 NOTE — Addendum Note (Signed)
Addended by: Desmond Dike C on: 11/02/2017 11:49 AM   Modules accepted: Orders

## 2017-11-16 MED FILL — GABAPENTIN 300 MG CAPS: 300 | 30 days supply | Qty: 180 | Fill #3

## 2017-11-16 MED FILL — PROMETHAZINE 25 MG TABLET: 25 | 90 days supply | Qty: 360 | Fill #1

## 2017-11-16 MED FILL — VENTOLIN HFA 90 MCG INHALER: 108 (90 BAS | 25 days supply | Qty: 18 | Fill #0

## 2017-11-28 MED FILL — cloNIDine 0.2 MG/24HR PTWK: 0.2 | 28 days supply | Qty: 4 | Fill #0

## 2017-11-29 MED FILL — VIMPAT 200 MG TABLET: 200 | 30 days supply | Qty: 60 | Fill #0

## 2017-12-03 ENCOUNTER — Ambulatory Visit: Payer: Medicare Other | Admitting: Emergency Medicine

## 2017-12-11 ENCOUNTER — Ambulatory Visit: Payer: Medicare Other | Admitting: Neurology

## 2017-12-13 MED FILL — LOSARTAN POTASSIUM 100 MG T: 100 | 30 days supply | Qty: 30 | Fill #0

## 2017-12-21 MED FILL — tiZANidine HCL 4 MG TABS: 4 | 30 days supply | Qty: 90 | Fill #0

## 2017-12-23 MED FILL — HUMALOG 100 UNITS/ML KWIKPE: 100 | 30 days supply | Qty: 15 | Fill #2

## 2017-12-23 MED FILL — UNIFINE PENTIPS 8MM 31G: 31G X 8 MM | 16 days supply | Qty: 100 | Fill #2

## 2017-12-24 ENCOUNTER — Encounter: Payer: Self-pay | Admitting: Emergency Medicine

## 2017-12-24 ENCOUNTER — Ambulatory Visit: Payer: Medicare Other | Admitting: Emergency Medicine

## 2017-12-24 DIAGNOSIS — D860 Sarcoidosis of lung: Secondary | ICD-10-CM | POA: Diagnosis not present

## 2017-12-24 MED FILL — CARVEDILOL 12.5 MG TABS: 12.5 | 90 days supply | Qty: 360 | Fill #0

## 2017-12-24 MED FILL — cloNIDine 0.2 MG/24HR PTWK: 0.2 | 28 days supply | Qty: 4 | Fill #1

## 2017-12-24 MED FILL — ONE TOUCH ULTRA TEST STRIPS: 90 days supply | Qty: 300 | Fill #3

## 2017-12-24 MED FILL — TOUJEO SOLOSTAR 300 UNITS/M: 300 | 90 days supply | Qty: 15 | Fill #1

## 2017-12-24 NOTE — Assessment & Plan Note (Signed)
No evidence based on her CT chest and her respiratory symptoms that there is been progressive parenchymal disease, flaring of pulmonary sarcoidosis.  Unfortunately she is now dealing with renal disease, seizures and certainly she is at risk for sarcoidosis as a cause for either 1.  Her brain is not yet been imaged.  She is going to see nephrology and also neurology.  Depending on their evaluations we will need to discuss whether she should be back on chronic immunosuppression.  She is been on methotrexate for and for the most part tolerated.  This would probably be the drug of choice.

## 2017-12-24 NOTE — Patient Instructions (Signed)
Keep your albuterol available to use as needed for shortness of breath, wheezing Follow with Dr Lorrene Reid and with Neurology as planned. I have some concern that your neurological and renal problems could be related to sarcoidosis. If so then we would need to consider dedicated sarcoidosis medication.  Continue your oxygen at 3-4l/min. We will re-qualify you for this today.  Follow with Dr Lamonte Sakai in 2 months or sooner if you have any problems.

## 2017-12-24 NOTE — Progress Notes (Signed)
Subjective:    Patient ID: Marissa Wilkerson, female    DOB: 03/11/1961, 57 y.o.   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  She has a history of abnormal CT chest and was ultimately diagnosed following biopsies with sarcoidosis.  We had been managing her on methotrexate, stopped this in October 2018.  She was admitted in late January with anasarca, dyspnea and acute renal failure that appeared to be consistent with a nephrotic syndrome, she was aggressively diuresed. CXR 2/19 with cardiomegaly, some mild interstitial prominence. Having some nasal congestion.   ROV 12/24/17 --follow-up visit for patient with a history of sarcoidosis that has been managed in the past with methotrexate.  Her recent course has been complicated by renal insufficiency and nephrotic syndrome. Since last time she has had more seizures, now on Vimpat. Possibly in setting UTI and fever. She has been hospitalized on a few occasions and has not been able to follow with Dr Lorrene Reid yet.  An MRI brain was attempted, but could not be done yet due to claustrophobia. Unclear whether the seizures could be related to sarcoid. She would like to avoid chronic MTX or pred if at all possible. Has albuterol available to use - quite rarely. Her o2 is at 3-4L/min.     Review of Systems  Constitutional: Negative for activity change, fever and unexpected weight change.  HENT: Negative for congestion, dental problem, ear pain, nosebleeds, postnasal drip, rhinorrhea, sinus pressure, sneezing, sore throat and trouble swallowing.   Eyes: Negative for redness and itching.  Respiratory: Positive for shortness of breath. Negative for cough, chest tightness and wheezing.   Cardiovascular: Negative for palpitations and leg swelling.  Gastrointestinal: Positive for vomiting. Negative for nausea.   Genitourinary: Negative for dysuria.  Musculoskeletal: Negative for back pain and joint swelling.  Skin: Negative for rash.  Neurological: Negative for headaches.  Hematological: Does not bruise/bleed easily.  Psychiatric/Behavioral: Negative for dysphoric mood. The patient is not nervous/anxious.        Objective:   Physical Exam Vitals:   12/24/17 1150  BP: 134/70  Pulse: (!) 106  SpO2: 93%  Weight: 261 lb (118.4 kg)  Height: 5\' 3"  (1.6 m)     Gen: Pleasant, overwt woman, in no distress,  normal affect  ENT: No lesions,  mouth clear,  oropharynx clear, no postnasal drip, some cushingoid features  Neck: No JVD, no stridor  Lungs: No use of accessory muscles, clear without rales or rhonchi, no wheeze  Cardiovascular: RRR, early systolic murmur best heard at the right sternal border, 2+ pitting edema lower extremities  Musculoskeletal: No deformities, no cyanosis or clubbing  Neuro: alert, non focal  Skin: Warm,  No rash                                                                                 Testing:  BAL fungal smear 1/8 >> negative, cx pending BAL AFB smear 1/8 >> negative, cx pending BAL cytology 1/8 >> negative Brushings cytology 1/8 >> negative TBBx's path 1/8 >> negative RF, ANA, ANCA,  1/8 >> all negative  IgE 1/7 >> 55 (normal)  Eosinophil count 1/7 >> 0.4 (normal)  Fungal hypersensitivity panel 1/7 >> all negative Quantiferon gold 1/7 >> negative HIV 1/7 >> negative   CBC    Component Value Date/Time   WBC 7.5 10/16/2017 2204   RBC 3.88 10/16/2017 2204   HGB 11.8 (L) 10/16/2017 2204   HCT 36.3 10/16/2017 2204   PLT 233 10/16/2017 2204   MCV 93.6 10/16/2017 2204   MCH 30.4 10/16/2017 2204   MCHC 32.5 10/16/2017 2204   RDW 15.5 10/16/2017 2204   LYMPHSABS 1.1 09/16/2017 0514   MONOABS 0.8 09/16/2017 0514   EOSABS 0.1 09/16/2017 0514   EOSABS 0.4 09/03/2013 1331   BASOSABS 0.0 09/16/2017 0514   BMP Latest Ref Rng & Units 10/19/2017  10/18/2017 10/17/2017  Glucose 65 - 99 mg/dL 172(H) 127(H) 176(H)  BUN 6 - 20 mg/dL 60(H) 91(H) 100(H)  Creatinine 0.44 - 1.00 mg/dL 1.20(H) 1.87(H) 2.36(H)  Sodium 135 - 145 mmol/L 139 139 136  Potassium 3.5 - 5.1 mmol/L 4.2 3.4(L) 3.3(L)  Chloride 101 - 111 mmol/L 106 101 95(L)  CO2 22 - 32 mmol/L 24 27 26   Calcium 8.9 - 10.3 mg/dL 8.7(L) 8.8(L) 8.8(L)    CT scan 06/13/17 --   COMPARISON:  High-resolution CT chest dated 10/01/2015  FINDINGS: Cardiovascular: Heart is top-normal in size. Trace pericardial fluid.  No evidence of thoracic aortic aneurysm. Mild atherosclerotic calcifications of the aortic root.  Mediastinum/Nodes:  Mild thoracic lymphadenopathy, including:  --10 mm short axis prevascular node (series 2/ image 49)  --11 mm short axis low right paratracheal node (series 2/image 54)  --12 mm short axis right hilar node (series 2/ image 66)  --13 mm short axis subcarinal node (series 2/image 67)  Visualized thyroid is unremarkable.  Lungs/Pleura: Nodular scarring in the left upper lobe measuring 4 x 10 mm (series 3/ image 34), grossly unchanged.  7 mm subpleural nodule in the left lower lobe (series 3/ image 121), grossly unchanged.  Perifissural nodularity along the minor fissure measuring up to 4 mm.  Scattered 4 mm nodules in the left lower lobe (series 3/images 100, 105, and 108), unchanged.  Platelike scarring/ atelectasis in the lingula with air bronchograms (series 3/ image 85).  Mosaic attenuation in the lungs bilaterally. Mild patchy left basilar opacity, likely atelectasis.  Postsurgical changes related to left lower lobe wedge resection.  No pleural effusion or pneumothorax.  Upper Abdomen: Visualized upper abdomen is notable for mild hepatic steatosis and cholecystectomy clips.  Musculoskeletal: Visualized osseous structures are within normal limits.  IMPRESSION: Stable nodularity in the lungs bilaterally, likely  related to known sarcoidosis, unchanged since 2017, benign.  Mild thoracic lymphadenopathy, also likely related to sarcoidosis.  Aortic Atherosclerosis (ICD10-I70.0).    Assessment & Plan:  Sarcoidosis of lung ([place]) No evidence based on her CT chest and her respiratory symptoms that there is been progressive parenchymal disease, flaring of pulmonary sarcoidosis.  Unfortunately she is now dealing with renal disease, seizures and certainly she is at risk for sarcoidosis as a cause for either 1.  Her brain is not yet been imaged.  She is going to see nephrology and also neurology.  Depending on their evaluations we will need to discuss whether she should be back on chronic immunosuppression.  She is been on methotrexate for and for the most part tolerated.  This would probably be the drug of choice.  Baltazar Apo, MD, PhD 12/24/2017, 12:26 PM [facility] [phone] or if no answer [phone]

## 2017-12-27 MED FILL — VIMPAT 200 MG TABLET: 200 | 30 days supply | Qty: 60 | Fill #1

## 2017-12-27 MED FILL — PROAIR HFA 90 MCG INHALER: 108 (90 BAS | 25 days supply | Qty: 9 | Fill #0

## 2018-01-09 MED FILL — LOSARTAN POTASSIUM 100 MG T: 100 | 30 days supply | Qty: 30 | Fill #1

## 2018-01-11 MED FILL — CIPROFLOXACIN HCL 500 MG TA: 500 | 7 days supply | Qty: 14 | Fill #0

## 2018-01-22 MED FILL — HYDROCODON-APAP 10-325: 10-325 | 30 days supply | Qty: 180 | Fill #0

## 2018-01-23 MED FILL — METOCLOPRAMIDE 5 MG TABLET: 5 | 40 days supply | Qty: 120 | Fill #0

## 2018-01-24 MED FILL — VIMPAT 200 MG TABLET: 200 | 30 days supply | Qty: 60 | Fill #0

## 2018-02-05 MED FILL — LOSARTAN POTASSIUM 100 MG T: 100 | 30 days supply | Qty: 30 | Fill #2

## 2018-02-18 ENCOUNTER — Ambulatory Visit: Payer: Medicare Other | Admitting: Emergency Medicine

## 2018-02-18 ENCOUNTER — Encounter: Payer: Self-pay | Admitting: Emergency Medicine

## 2018-02-18 VITALS — BP 126/72 | HR 102 | Ht 63.0 in | Wt 241.8 lb

## 2018-02-18 DIAGNOSIS — J449 Chronic obstructive pulmonary disease, unspecified: Secondary | ICD-10-CM | POA: Diagnosis not present

## 2018-02-18 DIAGNOSIS — G4710 Hypersomnia, unspecified: Secondary | ICD-10-CM | POA: Diagnosis not present

## 2018-02-18 DIAGNOSIS — N179 Acute kidney failure, unspecified: Secondary | ICD-10-CM

## 2018-02-18 DIAGNOSIS — D860 Sarcoidosis of lung: Secondary | ICD-10-CM | POA: Diagnosis not present

## 2018-02-18 NOTE — Patient Instructions (Signed)
Please continue your oxygen at 3 to 4 L/min. Keep your albuterol available to use if needed for shortness of breath, wheezing, chest tightness. We will arrange for a split-night sleep study to be performed to evaluate for sleep apnea. Continue to follow with your neurologist and your nephrologist.  Dr. Lamonte Sakai will review your case with them to discuss your status, your sarcoidosis Follow with Dr Lamonte Sakai in 6 weeks to review sleep study, or sooner if you have any problems

## 2018-02-18 NOTE — Assessment & Plan Note (Signed)
Stable pulmonary status.  She is on stable oxygen 3 to 4 L/min.  Most recent imaging without any interval change.

## 2018-02-18 NOTE — Progress Notes (Signed)
Subjective:    Patient ID: Eugenie Filler, female    DOB: 05/18/1961, 57 y.o.   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  She has a history of abnormal CT chest and was ultimately diagnosed following biopsies with sarcoidosis.  We had been managing her on methotrexate, stopped this in October 2018.  She was admitted in late January with anasarca, dyspnea and acute renal failure that appeared to be consistent with a nephrotic syndrome, she was aggressively diuresed. CXR 2/19 with cardiomegaly, some mild interstitial prominence. Having some nasal congestion.   ROV 12/24/17 --follow-up visit for patient with a history of sarcoidosis that has been managed in the past with methotrexate.  Her recent course has been complicated by renal insufficiency and nephrotic syndrome. Since last time she has had more seizures, now on Vimpat. Possibly in setting UTI and fever. She has been hospitalized on a few occasions and has not been able to follow with Dr Lorrene Reid yet.  An MRI brain was attempted, but could not be done yet due to claustrophobia. Unclear whether the seizures could be related to sarcoid. She would like to avoid chronic MTX or pred if at all possible. Has albuterol available to use - quite rarely. Her o2 is at 3-4L/min.    ROV 02/18/18 --Ms. Tyer has a history of sarcoidosis with pulmonary and presumed renal manifestations.  Also has hypertension, diabetes, history of Reiter's syndrome, newly diagnosed seizure disorder.  She is previously been on methotrexate, has been off since October 2018.  Under evaluation by both neurology and nephrology.  She reports today that she has seen Dr Doy Hutching with neuro, Dr Lorrene Reid with nephro. No plans currently for a renal bx. No changes to her seizure meds. Her breathing has been stable. She remains on 3-4L/min,  has been working w physical therapy. hsa not needed albuterol in months. She is scheduled for a sleep consult soon - we have talked about a PSG at times in the past but have not performed   Review of Systems  Constitutional: Negative for activity change, fever and unexpected weight change.  HENT: Negative for congestion, dental problem, ear pain, nosebleeds, postnasal drip, rhinorrhea, sinus pressure, sneezing, sore throat and trouble swallowing.   Eyes: Negative for redness and itching.  Respiratory: Positive for shortness of breath. Negative for cough, chest tightness and wheezing.   Cardiovascular: Negative for palpitations and leg swelling.  Gastrointestinal: Positive for vomiting. Negative for nausea.  Genitourinary: Negative for dysuria.  Musculoskeletal: Negative for back pain and joint swelling.  Skin: Negative for rash.  Neurological: Negative for headaches.  Hematological: Does not bruise/bleed easily.  Psychiatric/Behavioral: Negative for dysphoric mood. The patient is not nervous/anxious.        Objective:   Physical Exam Vitals:   02/18/18 1408  BP: 126/72  Pulse: (!) 102  SpO2: 98%  Weight: 241 lb 12.8 oz (109.7 kg)  Height: 5\' 3"  (1.6 m)     Gen: Pleasant, overwt woman, in no distress,  normal affect  ENT: No lesions,  mouth clear,  oropharynx clear, no postnasal drip, some cushingoid features  Neck: No JVD, no stridor  Lungs: No use of accessory muscles, clear without rales or rhonchi, no wheeze  Cardiovascular: RRR, early systolic murmur best heard at the right sternal border, 2+ pitting edema lower extremities  Musculoskeletal: No deformities, no cyanosis or clubbing  Neuro: alert, non focal  Skin: Warm,  No rash                                                                                 Testing:  BAL fungal smear 1/8 >> negative, cx pending BAL AFB smear 1/8 >> negative, cx pending BAL cytology 1/8 >> negative Brushings cytology 1/8 >>  negative TBBx's path 1/8 >> negative RF, ANA, ANCA, 1/8 >> all negative  IgE 1/7 >> 55 (normal)  Eosinophil count 1/7 >> 0.4 (normal)  Fungal hypersensitivity panel 1/7 >> all negative Quantiferon gold 1/7 >> negative HIV 1/7 >> negative   CBC    Component Value Date/Time   WBC 7.5 10/16/2017 2204   RBC 3.88 10/16/2017 2204   HGB 11.8 (L) 10/16/2017 2204   HCT 36.3 10/16/2017 2204   PLT 233 10/16/2017 2204   MCV 93.6 10/16/2017 2204   MCH 30.4 10/16/2017 2204   MCHC 32.5 10/16/2017 2204   RDW 15.5 10/16/2017 2204   LYMPHSABS 1.1 09/16/2017 0514   MONOABS 0.8 09/16/2017 0514   EOSABS 0.1 09/16/2017 0514   EOSABS 0.4 09/03/2013 1331   BASOSABS 0.0 09/16/2017 0514   BMP Latest Ref Rng & Units 10/19/2017 10/18/2017 10/17/2017  Glucose 65 - 99 mg/dL 172(H) 127(H) 176(H)  BUN 6 - 20 mg/dL 60(H) 91(H) 100(H)  Creatinine 0.44 - 1.00 mg/dL 1.20(H) 1.87(H) 2.36(H)  Sodium 135 - 145 mmol/L 139 139 136  Potassium 3.5 - 5.1 mmol/L 4.2 3.4(L) 3.3(L)  Chloride 101 - 111 mmol/L 106 101 95(L)  CO2 22 - 32 mmol/L 24 27 26   Calcium 8.9 - 10.3 mg/dL 8.7(L) 8.8(L) 8.8(L)      Assessment & Plan:  Sarcoidosis of lung ([facility]) Stable pulmonary status.  She is on stable oxygen 3 to 4 L/min.  Most recent imaging without any interval change.  Obstructive lung disease Keep albuterol available to use as needed  AKI (acute kidney injury) ([place]) I will review her case with Dr. Lorrene Reid.  Ensure that there is no concern that her renal disease is related to sarcoid.  If so then we would consider anti-inflammatory medication  Baltazar Apo, MD, PhD 02/18/2018, 2:25 PM [facility] [phone] or if no answer [phone]

## 2018-02-18 NOTE — Assessment & Plan Note (Signed)
Keep albuterol available to use as needed

## 2018-02-18 NOTE — Assessment & Plan Note (Signed)
I will review her case with Dr. Lorrene Reid.  Ensure that there is no concern that her renal disease is related to sarcoid.  If so then we would consider anti-inflammatory medication

## 2018-02-25 MED FILL — UNIFINE PENTIPS 8MM 31G: 31G X 8 MM | 16 days supply | Qty: 100 | Fill #3

## 2018-02-25 MED FILL — METOCLOPRAMIDE 5 MG TABLET: 5 | 40 days supply | Qty: 120 | Fill #1

## 2018-02-25 MED FILL — VIMPAT 200 MG TABLET: 200 | 30 days supply | Qty: 60 | Fill #1

## 2018-02-25 MED FILL — HYDROCODON-APAP 10-325: 10-325 | 16 days supply | Qty: 100 | Fill #0

## 2018-03-06 ENCOUNTER — Ambulatory Visit: Payer: Medicare Other | Admitting: Neurology

## 2018-03-12 ENCOUNTER — Ambulatory Visit (HOSPITAL_BASED_OUTPATIENT_CLINIC_OR_DEPARTMENT_OTHER): Payer: Medicare Other | Attending: Emergency Medicine | Admitting: Pulmonary Disease

## 2018-03-12 VITALS — Ht 63.0 in | Wt 250.0 lb

## 2018-03-12 DIAGNOSIS — G4730 Sleep apnea, unspecified: Secondary | ICD-10-CM

## 2018-03-12 DIAGNOSIS — G4710 Hypersomnia, unspecified: Secondary | ICD-10-CM

## 2018-03-12 DIAGNOSIS — G4736 Sleep related hypoventilation in conditions classified elsewhere: Secondary | ICD-10-CM | POA: Diagnosis not present

## 2018-03-14 DIAGNOSIS — G4710 Hypersomnia, unspecified: Secondary | ICD-10-CM

## 2018-03-14 NOTE — Procedures (Signed)
Patient Name: Marissa Wilkerson, Marissa Wilkerson Date: 03/12/2018 Gender: Female D.O.B: Aug 28, 1961 Age (years): 56 Referring Provider: Baltazar Apo Height (inches): 67 Interpreting Physician: Kara Mead MD, ABSM Weight (lbs): 250 RPSGT: Laren Everts BMI: 44 MRN: [id number] Neck Size: 17.50 <br> <br> CLINICAL INFORMATION Sleep Study Type: NPSG    Indication for sleep study: Daytime Fatigue, Diabetes, Hypertension, Obesity    Epworth Sleepiness Score: 5    SLEEP STUDY TECHNIQUE As per the AASM Manual for the Scoring of Sleep and Associated Events v2.3 (April 2016) with a hypopnea requiring 4% desaturations.  The channels recorded and monitored were frontal, central and occipital EEG, electrooculogram (EOG), submentalis EMG (chin), nasal and oral airflow, thoracic and abdominal wall motion, anterior tibialis EMG, snore microphone, electrocardiogram, and pulse oximetry.  MEDICATIONS Medications self-administered by patient taken the night of the study : BENADRYL, ATORVASTATIN, VIMPAT, REGLAN, TIZANIDINE, VICODIN  SLEEP ARCHITECTURE The study was initiated at 10:58:10 PM and ended at 5:41:28 AM.  Sleep onset time was 24.7 minutes and the sleep efficiency was 86.7%%. The total sleep time was 349.5 minutes.  Stage REM latency was 79.0 minutes.  The patient spent 4.4%% of the night in stage N1 sleep, 91.3%% in stage N2 sleep, 0.0%% in stage N3 and 4.29% in REM.  Alpha intrusion was absent.  Supine sleep was 9.44%.  RESPIRATORY PARAMETERS The overall apnea/hypopnea index (AHI) was 0.0 per hour. There were 0 total apneas, including 0 obstructive, 0 central and 0 mixed apneas. There were 0 hypopneas and 4 RERAs.  The AHI during Stage REM sleep was 0.0 per hour.  AHI while supine was 0.0 per hour.  The mean oxygen saturation was 99.0%. The minimum SpO2 during sleep was 96.0%.  snoring was noted during this study.  CARDIAC DATA The 2 lead EKG demonstrated sinus rhythm. The mean  heart rate was 99.7 beats per minute. Other EKG findings include: None.   LEG MOVEMENT DATA The total PLMS were 0 with a resulting PLMS index of 0.0. Associated arousal with leg movement index was 0.0 .  IMPRESSIONS - No significant obstructive sleep apnea occurred during this study (AHI = 0.0/h). - No significant central sleep apnea occurred during this study (CAI = 0.0/h). - The patient had minimal or no oxygen desaturation during the study (Min O2 = 96.0%) - No snoring was audible during this study. - No cardiac abnormalities were noted during this study. - Clinically significant periodic limb movements did not occur during sleep. No significant associated arousals.   DIAGNOSIS - Nocturnal Hypoxemia (327.26 [G47.36 ICD-10]) - note that this study was performed on 3L O2   RECOMMENDATIONS - Consider other causes of somnolence such as medications or narcolepsy  - Avoid alcohol, sedatives and other CNS depressants that may worsen sleep apnea and disrupt normal sleep architecture. - Sleep hygiene should be reviewed to assess factors that may improve sleep quality. - Weight management and regular exercise should be initiated or continued if appropriate.   Kara Mead MD Board Certified in [place]

## 2018-03-18 MED FILL — LOSARTAN POTASSIUM 100 MG T: 100 | 90 days supply | Qty: 90 | Fill #0

## 2018-03-18 MED FILL — TOUJEO SOLOSTAR 300 UNITS/M: 300 | 90 days supply | Qty: 15 | Fill #2

## 2018-03-18 MED FILL — tiZANidine HCL 4 MG TABS: 4 | 90 days supply | Qty: 270 | Fill #1

## 2018-03-18 MED FILL — ATORVASTATIN CALCIUM 20 MG: 20 | 90 days supply | Qty: 90 | Fill #0

## 2018-03-27 MED FILL — VIMPAT 200 MG TABLET: 200 | 30 days supply | Qty: 60 | Fill #2

## 2018-03-29 MED FILL — HYDROCODON-APAP 10-325: 10-325 | 17 days supply | Qty: 100 | Fill #0

## 2018-04-02 ENCOUNTER — Ambulatory Visit: Payer: Medicare Other | Admitting: Emergency Medicine

## 2018-04-08 ENCOUNTER — Institutional Professional Consult (permissible substitution): Payer: Self-pay | Admitting: Neurology

## 2018-04-08 ENCOUNTER — Other Ambulatory Visit (HOSPITAL_COMMUNITY): Payer: Self-pay | Admitting: *Deleted

## 2018-04-09 ENCOUNTER — Encounter (HOSPITAL_COMMUNITY)
Admission: RE | Admit: 2018-04-09 | Discharge: 2018-04-09 | Disposition: A | Discharge status: Home / Self Care | Payer: Medicare Other | Source: Ambulatory Visit | Attending: Nephrology | Admitting: Nephrology

## 2018-04-09 VITALS — BP 148/85 | HR 78 | Temp 98.8°F

## 2018-04-09 DIAGNOSIS — N179 Acute kidney failure, unspecified: Secondary | ICD-10-CM | POA: Insufficient documentation

## 2018-04-09 DIAGNOSIS — D631 Anemia in chronic kidney disease: Secondary | ICD-10-CM | POA: Insufficient documentation

## 2018-04-09 LAB — POCT HEMOGLOBIN-HEMACUE: Hemoglobin: 10 g/dL — ABNORMAL LOW (ref 12.0–15.0)

## 2018-04-09 MED ORDER — EPOETIN ALFA-EPBX 10000 UNIT/ML IJ SOLN
10000.0000 [IU] | INTRAMUSCULAR | Status: DC
  Administered 2018-04-09: 14:00:00 10000 [IU] via SUBCUTANEOUS
  Filled 2018-04-09: qty 1

## 2018-04-09 NOTE — Discharge Instructions (Signed)
Epoetin Alfa injection °What is this medicine? °EPOETIN ALFA (e POE e tin AL fa) helps your body make more red blood cells. This medicine is used to treat anemia caused by chronic kidney failure, cancer chemotherapy, or HIV-therapy. It may also be used before surgery if you have anemia. °This medicine may be used for other purposes; ask your health care provider or pharmacist if you have questions. °COMMON BRAND NAME(S): Epogen, Procrit °What should I tell my health care provider before I take this medicine? °They need to know if you have any of these conditions: °-blood clotting disorders °-cancer patient not on chemotherapy °-cystic fibrosis °-heart disease, such as angina or heart failure °-hemoglobin level of 12 g/dL or greater °-high blood pressure °-low levels of folate, iron, or vitamin B12 °-seizures °-an unusual or allergic reaction to erythropoietin, albumin, benzyl alcohol, hamster proteins, other medicines, foods, dyes, or preservatives °-pregnant or trying to get pregnant °-breast-feeding °How should I use this medicine? °This medicine is for injection into a vein or under the skin. It is usually given by a health care professional in a hospital or clinic setting. °If you get this medicine at home, you will be taught how to prepare and give this medicine. Use exactly as directed. Take your medicine at regular intervals. Do not take your medicine more often than directed. °It is important that you put your used needles and syringes in a special sharps container. Do not put them in a trash can. If you do not have a sharps container, call your pharmacist or healthcare provider to get one. °A special MedGuide will be given to you by the pharmacist with each prescription and refill. Be sure to read this information carefully each time. °Talk to your pediatrician regarding the use of this medicine in children. While this drug may be prescribed for selected conditions, precautions do apply. °Overdosage: If you  think you have taken too much of this medicine contact a poison control center or emergency room at once. °NOTE: This medicine is only for you. Do not share this medicine with others. °What if I miss a dose? °If you miss a dose, take it as soon as you can. If it is almost time for your next dose, take only that dose. Do not take double or extra doses. °What may interact with this medicine? °Do not take this medicine with any of the following medications: °-darbepoetin alfa °This list may not describe all possible interactions. Give your health care provider a list of all the medicines, herbs, non-prescription drugs, or dietary supplements you use. Also tell them if you smoke, drink alcohol, or use illegal drugs. Some items may interact with your medicine. °What should I watch for while using this medicine? °Your condition will be monitored carefully while you are receiving this medicine. °You may need blood work done while you are taking this medicine. °What side effects may I notice from receiving this medicine? °Side effects that you should report to your doctor or health care professional as soon as possible: °-allergic reactions like skin rash, itching or hives, swelling of the face, lips, or tongue °-breathing problems °-changes in vision °-chest pain °-confusion, trouble speaking or understanding °-feeling faint or lightheaded, falls °-high blood pressure °-muscle aches or pains °-pain, swelling, warmth in the leg °-rapid weight gain °-severe headaches °-sudden numbness or weakness of the face, arm or leg °-trouble walking, dizziness, loss of balance or coordination °-seizures (convulsions) °-swelling of the ankles, feet, hands °-unusually weak or tired °  Side effects that usually do not require medical attention (report to your doctor or health care professional if they continue or are bothersome): °-diarrhea °-fever, chills (flu-like symptoms) °-headaches °-nausea, vomiting °-redness, stinging, or swelling at  site where injected °This list may not describe all possible side effects. Call your doctor for medical advice about side effects. You may report side effects to FDA at 1-800-FDA-1088. °Where should I keep my medicine? °Keep out of the reach of children. °Store in a refrigerator between 2 and 8 degrees C (36 and 46 degrees F). Do not freeze or shake. Throw away any unused portion if using a single-dose vial. Multi-dose vials can be kept in the refrigerator for up to 21 days after the initial dose. Throw away unused medicine. °NOTE: This sheet is a summary. It may not cover all possible information. If you have questions about this medicine, talk to your doctor, pharmacist, or health care provider. °© 2018 [employer]vier/Gold Standard (2016-04-03 19:42:31) ° °

## 2018-04-12 ENCOUNTER — Encounter: Payer: Self-pay | Admitting: Emergency Medicine

## 2018-04-12 ENCOUNTER — Ambulatory Visit: Payer: Medicare Other | Admitting: Emergency Medicine

## 2018-04-12 DIAGNOSIS — D860 Sarcoidosis of lung: Secondary | ICD-10-CM | POA: Diagnosis not present

## 2018-04-12 DIAGNOSIS — J310 Chronic rhinitis: Secondary | ICD-10-CM | POA: Diagnosis not present

## 2018-04-12 DIAGNOSIS — J9610 Chronic respiratory failure, unspecified whether with hypoxia or hypercapnia: Secondary | ICD-10-CM | POA: Diagnosis not present

## 2018-04-12 DIAGNOSIS — R0683 Snoring: Secondary | ICD-10-CM

## 2018-04-12 NOTE — Assessment & Plan Note (Signed)
Currently on Flonase once daily, Benadryl.  She has tried nasal saline washes in the past but did not tolerate, may have a deviated septum.  We will try adding loratadine.  Increase her Flonase to twice a day.  If she continues to have drainage then we may decide to pursue either Atrovent or Astelin nasal spray.

## 2018-04-12 NOTE — Progress Notes (Signed)
Subjective:    Patient ID: Marissa Wilkerson, female    DOB: 14-Nov-1960, 57 y.o.   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  Also has hypertension, diabetes, history of Reiter's syndrome, newly diagnosed seizure disorder.  She is previously been on methotrexate, has been off since October 2018.  Under evaluation by both neurology and nephrology.  She reports today that she has seen Marissa Wilkerson with neuro, Marissa Wilkerson with nephro. No plans currently for a renal bx. No changes to her seizure meds. Her breathing has been stable. She remains on 3-4L/min, has been working w physical therapy. hsa not needed albuterol in months. She is scheduled for a sleep consult soon - we have talked about a PSG at times in the past but have not performed  ROV 04/12/18 --57 year old woman with multiple medical problems as outlined above whom I have followed for obstructive lung disease and a history of pulmonary infiltrates diagnosed by biopsy and sarcoidosis.  Her infiltrates improved with steroids, methotrexate which she is no longer on (since 05/2017).  She also has a history of renal failure, seizures.  She underwent a split-night sleep study on 03/12/2018 which I reviewed, shows no evidence for obstructive, central sleep apnea, no desaturations on 3L/min. She is having a lot of nasal and throat congestion, on benadryl and flonase qd. Rare albuterol use.    Review of Systems  Constitutional: Negative for activity change, fever and unexpected weight change.  HENT: Negative for congestion, dental problem, ear pain, nosebleeds, postnasal drip, rhinorrhea, sinus pressure, sneezing, sore throat and trouble swallowing.   Eyes: Negative for redness and itching.  Respiratory: Positive for shortness of breath. Negative for cough, chest tightness and wheezing.   Cardiovascular: Negative for palpitations and leg swelling.  Gastrointestinal: Positive for  vomiting. Negative for nausea.  Genitourinary: Negative for dysuria.  Musculoskeletal: Negative for back pain and joint swelling.  Skin: Negative for rash.  Neurological: Negative for headaches.  Hematological: Does not bruise/bleed easily.  Psychiatric/Behavioral: Negative for dysphoric mood. The patient is not nervous/anxious.        Objective:   Physical Exam Vitals:   04/12/18 1009  BP: 136/82  Pulse: (!) 111  SpO2: 90%  Weight: 251 lb (113.9 kg)  Height: 5\' 3"  (1.6 m)     Gen: Pleasant, overwt woman, in no distress,  normal affect  ENT: No lesions,  mouth clear,  oropharynx clear, no postnasal drip, some cushingoid features  Neck: No JVD, no stridor  Lungs: No use of accessory muscles, clear without rales or rhonchi, no wheeze  Cardiovascular: RRR, early systolic murmur best heard at the right sternal border, 2+ pitting edema lower extremities  Musculoskeletal: No deformities, no cyanosis or clubbing  Neuro: alert, non focal  Skin: Warm,  No rash                                                                                 Testing:  BAL fungal smear 1/8 >> negative, cx pending BAL AFB smear 1/8 >> negative, cx pending BAL cytology 1/8 >> negative Brushings cytology 1/8 >> negative TBBx's path  1/8 >> negative RF, ANA, ANCA, 1/8 >> all negative  IgE 1/7 >> 55 (normal)  Eosinophil count 1/7 >> 0.4 (normal)  Fungal hypersensitivity panel 1/7 >> all negative Quantiferon gold 1/7 >> negative HIV 1/7 >> negative   CBC    Component Value Date/Time   WBC 7.5 10/16/2017 2204   RBC 3.88 10/16/2017 2204   HGB 10.0 (L) 04/09/2018 1409   HCT 36.3 10/16/2017 2204   PLT 233 10/16/2017 2204   MCV 93.6 10/16/2017 2204   MCH 30.4 10/16/2017 2204   MCHC 32.5 10/16/2017 2204   RDW 15.5 10/16/2017 2204   LYMPHSABS 1.1 09/16/2017 0514   MONOABS 0.8 09/16/2017 0514   EOSABS 0.1 09/16/2017 0514   EOSABS 0.4 09/03/2013 1331   BASOSABS 0.0 09/16/2017 0514   BMP  Latest Ref Rng & Units 10/19/2017 10/18/2017 10/17/2017  Glucose 65 - 99 mg/dL 172(H) 127(H) 176(H)  BUN 6 - 20 mg/dL 60(H) 91(H) 100(H)  Creatinine 0.44 - 1.00 mg/dL 1.20(H) 1.87(H) 2.36(H)  Sodium 135 - 145 mmol/L 139 139 136  Potassium 3.5 - 5.1 mmol/L 4.2 3.4(L) 3.3(L)  Chloride 101 - 111 mmol/L 106 101 95(L)  CO2 22 - 32 mmol/L 24 27 26   Calcium 8.9 - 10.3 mg/dL 8.7(L) 8.8(L) 8.8(L)      Assessment & Plan:  Sarcoidosis of lung ([place]) Most recent chest x-ray was February 2019, infiltrates have resolved.  We have been following clinically and radiographically, plan next imaging at her next office visit. Keep albuterol available to use as needed.  Not currently on scheduled bronchodilators. Continue her oxygen  Chronic respiratory failure ([facility]) 3 L/min at all times including while sleeping.  Snoring No evidence of obstructive sleep apnea on her sleep study from 03/12/2018.  No clinically significant leg movements.  She does need oxygen at 3 L/min while sleeping.  Chronic rhinitis Currently on Flonase once daily, Benadryl.  She has tried nasal saline washes in the past but did not tolerate, may have a deviated septum.  We will try adding loratadine.  Increase her Flonase to twice a day.  If she continues to have drainage then we may decide to pursue either Atrovent or Astelin nasal spray.  Marissa Apo, MD, PhD 04/12/2018, 10:28 AM [facility] [phone] or if no answer [phone]

## 2018-04-12 NOTE — Assessment & Plan Note (Signed)
Most recent chest x-ray was February 2019, infiltrates have resolved.  We have been following clinically and radiographically, plan next imaging at her next office visit. Keep albuterol available to use as needed.  Not currently on scheduled bronchodilators. Continue her oxygen

## 2018-04-12 NOTE — Assessment & Plan Note (Signed)
No evidence of obstructive sleep apnea on her sleep study from 03/12/2018.  No clinically significant leg movements.  She does need oxygen at 3 L/min while sleeping.

## 2018-04-12 NOTE — Patient Instructions (Signed)
Your sleep study did not show any evidence for central or obstructive sleep apnea. Need to continue to wear your oxygen 3 L/min including while sleeping. Keep your albuterol available to use 2 puffs if needed for shortness of breath, chest tightness, wheezing. Continue Benadryl as you have been taking it. Try starting loratadine 10 mg (Claritin) once daily. Try increasing your fluticasone nasal spray to 2 sprays each nostril twice a day If your nasal congestion persist we might decide to add an additional nasal spray in the future. Get the flu shot in the Fall.  Follow with Dr Lamonte Sakai in 6 months or sooner if you have any problems

## 2018-04-12 NOTE — Assessment & Plan Note (Signed)
3 L/min at all times including while sleeping.

## 2018-04-16 MED FILL — UNIFINE PENTIPS 8MM 31G: 31G X 8 MM | 16 days supply | Qty: 100 | Fill #4

## 2018-04-16 MED FILL — METOCLOPRAMIDE 5 MG TABLET: 5 | 40 days supply | Qty: 120 | Fill #2

## 2018-04-16 MED FILL — LOSARTAN POTASSIUM 50 MG TA: 50 | 90 days supply | Qty: 90 | Fill #0

## 2018-04-23 ENCOUNTER — Encounter (HOSPITAL_COMMUNITY)
Admission: RE | Admit: 2018-04-23 | Discharge: 2018-04-23 | Disposition: A | Discharge status: Home / Self Care | Payer: Medicare Other | Source: Ambulatory Visit | Attending: Nephrology | Admitting: Nephrology

## 2018-04-23 VITALS — BP 132/80 | HR 118

## 2018-04-23 DIAGNOSIS — N179 Acute kidney failure, unspecified: Secondary | ICD-10-CM

## 2018-04-23 LAB — POCT HEMOGLOBIN-HEMACUE: Hemoglobin: 9.8 g/dL — ABNORMAL LOW (ref 12.0–15.0)

## 2018-04-23 MED ORDER — EPOETIN ALFA-EPBX 10000 UNIT/ML IJ SOLN
10000.0000 [IU] | INTRAMUSCULAR | Status: DC
  Administered 2018-04-23: 10000 [IU] via SUBCUTANEOUS
  Filled 2018-04-23 (×2): qty 1

## 2018-04-23 MED FILL — HUMALOG 100 UNITS/ML KWIKPE: 100 | 30 days supply | Qty: 15 | Fill #3

## 2018-04-26 MED FILL — HYDROCODON-APAP 10-325: 10-325 | 16 days supply | Qty: 100 | Fill #0

## 2018-05-07 ENCOUNTER — Ambulatory Visit (HOSPITAL_COMMUNITY)
Admission: RE | Admit: 2018-05-07 | Discharge: 2018-05-07 | Disposition: A | Discharge status: Home / Self Care | Payer: Medicare Other | Source: Ambulatory Visit | Attending: Nephrology | Admitting: Nephrology

## 2018-05-07 VITALS — BP 148/74 | HR 85 | Temp 98.6°F | Resp 20

## 2018-05-07 DIAGNOSIS — D631 Anemia in chronic kidney disease: Secondary | ICD-10-CM | POA: Insufficient documentation

## 2018-05-07 DIAGNOSIS — N179 Acute kidney failure, unspecified: Secondary | ICD-10-CM | POA: Diagnosis present

## 2018-05-07 LAB — IRON AND TIBC
Iron: 57 ug/dL (ref 28–170)
Saturation Ratios: 17 % (ref 10.4–31.8)
TIBC: 333 ug/dL (ref 250–450)
UIBC: 276 ug/dL

## 2018-05-07 LAB — POCT HEMOGLOBIN-HEMACUE: Hemoglobin: 9.9 g/dL — ABNORMAL LOW (ref 12.0–15.0)

## 2018-05-07 LAB — FERRITIN: FERRITIN: 155 ng/mL (ref 11–307)

## 2018-05-07 MED ORDER — EPOETIN ALFA-EPBX 10000 UNIT/ML IJ SOLN
10000.0000 [IU] | INTRAMUSCULAR | Status: DC
  Administered 2018-05-07: 14:00:00 10000 [IU] via SUBCUTANEOUS
  Filled 2018-05-07: qty 1

## 2018-05-21 ENCOUNTER — Ambulatory Visit (HOSPITAL_COMMUNITY)
Admission: RE | Admit: 2018-05-21 | Discharge: 2018-05-21 | Disposition: A | Discharge status: Home / Self Care | Payer: Medicare Other | Source: Ambulatory Visit | Attending: Nephrology | Admitting: Nephrology

## 2018-05-21 VITALS — BP 136/82 | HR 115 | Temp 97.7°F | Resp 20

## 2018-05-21 DIAGNOSIS — N189 Chronic kidney disease, unspecified: Secondary | ICD-10-CM | POA: Insufficient documentation

## 2018-05-21 DIAGNOSIS — N179 Acute kidney failure, unspecified: Secondary | ICD-10-CM | POA: Insufficient documentation

## 2018-05-21 DIAGNOSIS — Z79899 Other long term (current) drug therapy: Secondary | ICD-10-CM | POA: Diagnosis not present

## 2018-05-21 DIAGNOSIS — Z5181 Encounter for therapeutic drug level monitoring: Secondary | ICD-10-CM | POA: Diagnosis not present

## 2018-05-21 DIAGNOSIS — D631 Anemia in chronic kidney disease: Secondary | ICD-10-CM | POA: Insufficient documentation

## 2018-05-21 LAB — POCT HEMOGLOBIN-HEMACUE: Hemoglobin: 9.8 g/dL — ABNORMAL LOW (ref 12.0–15.0)

## 2018-05-21 MED ORDER — EPOETIN ALFA-EPBX 10000 UNIT/ML IJ SOLN
10000.0000 [IU] | INTRAMUSCULAR | Status: DC
  Administered 2018-05-21: 10000 [IU] via SUBCUTANEOUS
  Filled 2018-05-21: qty 1

## 2018-05-23 MED FILL — VIMPAT 200 MG TABLET: 200 | 30 days supply | Qty: 60 | Fill #3

## 2018-05-23 MED FILL — HUMALOG 100 UNITS/ML KWIKPE: 100 | 30 days supply | Qty: 15 | Fill #4

## 2018-05-23 MED FILL — METOCLOPRAMIDE 5 MG TABLET: 5 | 40 days supply | Qty: 120 | Fill #3

## 2018-05-29 MED FILL — TOUJEO SOLOSTAR 300 UNITS/M: 300 | 26 days supply | Qty: 6 | Fill #0

## 2018-05-29 MED FILL — HYDROCODON-APAP 10-325: 10-325 | 16 days supply | Qty: 100 | Fill #0

## 2018-06-03 ENCOUNTER — Encounter (HOSPITAL_COMMUNITY): Payer: Medicare Other

## 2018-06-05 MED FILL — FLUoxetine HCL 20 MG CAPS: 20 | 30 days supply | Qty: 30 | Fill #0

## 2018-06-06 ENCOUNTER — Encounter (HOSPITAL_COMMUNITY)
Admission: RE | Admit: 2018-06-06 | Discharge: 2018-06-06 | Disposition: A | Discharge status: Home / Self Care | Payer: Medicare Other | Source: Ambulatory Visit | Attending: Nephrology | Admitting: Nephrology

## 2018-06-06 ENCOUNTER — Other Ambulatory Visit (HOSPITAL_COMMUNITY): Payer: Self-pay | Admitting: *Deleted

## 2018-06-06 VITALS — BP 130/61 | HR 117 | Temp 98.2°F | Resp 20

## 2018-06-06 DIAGNOSIS — N179 Acute kidney failure, unspecified: Secondary | ICD-10-CM | POA: Diagnosis present

## 2018-06-06 DIAGNOSIS — D631 Anemia in chronic kidney disease: Secondary | ICD-10-CM | POA: Insufficient documentation

## 2018-06-06 LAB — IRON AND TIBC
IRON: 73 ug/dL (ref 28–170)
Saturation Ratios: 23 % (ref 10.4–31.8)
TIBC: 319 ug/dL (ref 250–450)
UIBC: 246 ug/dL

## 2018-06-06 LAB — POCT HEMOGLOBIN-HEMACUE: Hemoglobin: 10 g/dL — ABNORMAL LOW (ref 12.0–15.0)

## 2018-06-06 LAB — FERRITIN: Ferritin: 162 ng/mL (ref 11–307)

## 2018-06-06 MED ORDER — EPOETIN ALFA-EPBX 10000 UNIT/ML IJ SOLN: 10000.0000 [IU] | INTRAMUSCULAR | Status: DC

## 2018-06-06 MED ORDER — EPOETIN ALFA-EPBX 10000 UNIT/ML IJ SOLN
15000.0000 [IU] | Freq: Once | INTRAMUSCULAR | Status: AC
  Administered 2018-06-06: 15000 [IU] via SUBCUTANEOUS
  Filled 2018-06-06: qty 2

## 2018-06-17 ENCOUNTER — Encounter (HOSPITAL_COMMUNITY): Payer: Medicare Other

## 2018-06-17 MED FILL — UNIFINE PENTIPS 8MM 31G: 31G X 8 MM | 16 days supply | Qty: 100 | Fill #5

## 2018-06-17 MED FILL — HUMALOG 100 UNITS/ML KWIKPE: 100 | 30 days supply | Qty: 15 | Fill #5

## 2018-06-17 MED FILL — ATORVASTATIN CALCIUM 20 MG: 20 | 90 days supply | Qty: 90 | Fill #1

## 2018-06-18 ENCOUNTER — Encounter (HOSPITAL_COMMUNITY): Payer: Medicare Other

## 2018-06-18 MED FILL — TOUJEO SOLOSTAR 300 UNITS/M: 300 | 26 days supply | Qty: 6 | Fill #1

## 2018-06-20 MED FILL — VIMPAT 200 MG TABLET: 200 | 30 days supply | Qty: 60 | Fill #4

## 2018-06-25 MED FILL — tiZANidine HCL 4 MG TABS: 4 | 90 days supply | Qty: 270 | Fill #0

## 2018-06-26 ENCOUNTER — Ambulatory Visit (HOSPITAL_COMMUNITY)
Admission: RE | Admit: 2018-06-26 | Discharge: 2018-06-26 | Disposition: A | Discharge status: Home / Self Care | Payer: Medicare Other | Source: Ambulatory Visit | Attending: Nephrology | Admitting: Nephrology

## 2018-06-26 VITALS — BP 167/83 | HR 115 | Temp 98.7°F | Resp 20

## 2018-06-26 DIAGNOSIS — D631 Anemia in chronic kidney disease: Secondary | ICD-10-CM | POA: Diagnosis present

## 2018-06-26 DIAGNOSIS — N179 Acute kidney failure, unspecified: Secondary | ICD-10-CM | POA: Insufficient documentation

## 2018-06-26 DIAGNOSIS — N189 Chronic kidney disease, unspecified: Secondary | ICD-10-CM | POA: Diagnosis present

## 2018-06-26 LAB — POCT HEMOGLOBIN-HEMACUE: HEMOGLOBIN: 10.6 g/dL — AB (ref 12.0–15.0)

## 2018-06-26 MED ORDER — EPOETIN ALFA-EPBX 10000 UNIT/ML IJ SOLN: 15000.0000 [IU] | INTRAMUSCULAR | Status: DC

## 2018-06-26 MED ORDER — EPOETIN ALFA-EPBX 3000 UNIT/ML IJ SOLN
3000.0000 [IU] | Freq: Once | INTRAMUSCULAR | Status: AC
  Administered 2018-06-26: 3000 [IU] via SUBCUTANEOUS
  Filled 2018-06-26: qty 1

## 2018-06-26 MED ORDER — EPOETIN ALFA-EPBX 10000 UNIT/ML IJ SOLN
10000.0000 [IU] | Freq: Once | INTRAMUSCULAR | Status: AC
  Administered 2018-06-26: 10000 [IU] via SUBCUTANEOUS
  Filled 2018-06-26: qty 1

## 2018-06-26 MED ORDER — EPOETIN ALFA-EPBX 2000 UNIT/ML IJ SOLN
2000.0000 [IU] | Freq: Once | INTRAMUSCULAR | Status: AC
  Administered 2018-06-26: 2000 [IU] via SUBCUTANEOUS
  Filled 2018-06-26: qty 1

## 2018-06-27 MED FILL — HYDROCODON-APAP 10-325: 10-325 | 16 days supply | Qty: 100 | Fill #0

## 2018-06-28 MED FILL — FLUoxetine HCL 20 MG CAPS: 20 | 90 days supply | Qty: 90 | Fill #0

## 2018-07-02 ENCOUNTER — Encounter (HOSPITAL_COMMUNITY): Payer: Medicare Other

## 2018-07-10 ENCOUNTER — Encounter (HOSPITAL_COMMUNITY): Payer: Medicare Other

## 2018-07-15 ENCOUNTER — Encounter (HOSPITAL_COMMUNITY): Payer: Medicare Other

## 2018-07-15 MED FILL — TOUJEO SOLOSTAR 300 UNITS/M: 300 | 26 days supply | Qty: 6 | Fill #2

## 2018-07-15 MED FILL — HUMALOG 100 UNITS/ML KWIKPE: 100 | 30 days supply | Qty: 15 | Fill #6

## 2018-07-15 MED FILL — LOSARTAN POTASSIUM 50 MG TA: 50 | 30 days supply | Qty: 30 | Fill #1

## 2018-07-15 MED FILL — METOCLOPRAMIDE 5 MG TABLET: 5 | 30 days supply | Qty: 120 | Fill #4

## 2018-07-22 ENCOUNTER — Encounter (HOSPITAL_COMMUNITY): Payer: Medicare Other

## 2018-07-24 ENCOUNTER — Encounter (HOSPITAL_COMMUNITY): Payer: Medicare Other

## 2018-07-24 MED FILL — VIMPAT 200 MG TABLET: 200 | 90 days supply | Qty: 180 | Fill #0

## 2018-07-24 MED FILL — UNIFINE PENTIPS 8MM 31G: 31G X 8 MM | 16 days supply | Qty: 100 | Fill #6

## 2018-08-02 ENCOUNTER — Ambulatory Visit (HOSPITAL_COMMUNITY)
Admission: RE | Admit: 2018-08-02 | Discharge: 2018-08-02 | Disposition: A | Discharge status: Home / Self Care | Payer: Medicare Other | Source: Ambulatory Visit | Attending: Nephrology | Admitting: Nephrology

## 2018-08-02 VITALS — BP 155/86 | HR 109 | Temp 98.5°F | Resp 20

## 2018-08-02 DIAGNOSIS — N179 Acute kidney failure, unspecified: Secondary | ICD-10-CM | POA: Insufficient documentation

## 2018-08-02 DIAGNOSIS — D631 Anemia in chronic kidney disease: Secondary | ICD-10-CM | POA: Insufficient documentation

## 2018-08-02 LAB — IRON AND TIBC
Iron: 44 ug/dL (ref 28–170)
SATURATION RATIOS: 18 % (ref 10.4–31.8)
TIBC: 245 ug/dL — ABNORMAL LOW (ref 250–450)
UIBC: 201 ug/dL

## 2018-08-02 LAB — FERRITIN: Ferritin: 165 ng/mL (ref 11–307)

## 2018-08-02 MED ORDER — EPOETIN ALFA-EPBX 10000 UNIT/ML IJ SOLN
15000.0000 [IU] | INTRAMUSCULAR | Status: DC
  Filled 2018-08-02: qty 2

## 2018-08-02 MED ORDER — EPOETIN ALFA-EPBX 3000 UNIT/ML IJ SOLN
3000.0000 [IU] | Freq: Once | INTRAMUSCULAR | Status: AC
  Administered 2018-08-02: 3000 [IU] via SUBCUTANEOUS
  Filled 2018-08-02: qty 1

## 2018-08-02 MED ORDER — EPOETIN ALFA-EPBX 2000 UNIT/ML IJ SOLN
2000.0000 [IU] | Freq: Once | INTRAMUSCULAR | Status: AC
  Administered 2018-08-02: 2000 [IU] via SUBCUTANEOUS
  Filled 2018-08-02: qty 1

## 2018-08-02 MED ORDER — EPOETIN ALFA-EPBX 10000 UNIT/ML IJ SOLN
10000.0000 [IU] | Freq: Once | INTRAMUSCULAR | Status: AC
  Administered 2018-08-02: 10000 [IU] via SUBCUTANEOUS
  Filled 2018-08-02: qty 1

## 2018-08-05 ENCOUNTER — Encounter (HOSPITAL_COMMUNITY): Payer: Medicare Other

## 2018-08-05 LAB — POCT HEMOGLOBIN-HEMACUE: Hemoglobin: 9.9 g/dL — ABNORMAL LOW (ref 12.0–15.0)

## 2018-08-06 MED FILL — UNIFINE PENTIPS 8MM 31G: 31G X 8 MM | 17 days supply | Qty: 100 | Fill #7

## 2018-08-06 MED FILL — TOUJEO SOLOSTAR 300 UNITS/M: 300 | 26 days supply | Qty: 6 | Fill #3

## 2018-08-06 MED FILL — HYDROCODON-APAP 10-325: 10-325 | 16 days supply | Qty: 100 | Fill #0

## 2018-08-13 MED FILL — LOSARTAN POTASSIUM 50 MG TA: 50 | 30 days supply | Qty: 30 | Fill #2

## 2018-08-13 MED FILL — METOCLOPRAMIDE 5 MG TABLET: 5 | 30 days supply | Qty: 120 | Fill #5

## 2018-08-16 ENCOUNTER — Encounter (HOSPITAL_COMMUNITY)
Admission: RE | Admit: 2018-08-16 | Discharge: 2018-08-16 | Disposition: A | Discharge status: Home / Self Care | Payer: Medicare Other | Source: Ambulatory Visit | Attending: Nephrology | Admitting: Nephrology

## 2018-08-16 VITALS — BP 179/87 | HR 115 | Temp 98.3°F | Resp 20

## 2018-08-16 DIAGNOSIS — D631 Anemia in chronic kidney disease: Secondary | ICD-10-CM | POA: Diagnosis not present

## 2018-08-16 DIAGNOSIS — N179 Acute kidney failure, unspecified: Secondary | ICD-10-CM | POA: Diagnosis present

## 2018-08-16 LAB — POCT HEMOGLOBIN-HEMACUE: Hemoglobin: 10.6 g/dL — ABNORMAL LOW (ref 12.0–15.0)

## 2018-08-16 MED ORDER — EPOETIN ALFA-EPBX 3000 UNIT/ML IJ SOLN
3000.0000 [IU] | Freq: Once | INTRAMUSCULAR | Status: AC
  Administered 2018-08-16: 3000 [IU] via SUBCUTANEOUS
  Filled 2018-08-16: qty 1

## 2018-08-16 MED ORDER — EPOETIN ALFA-EPBX 10000 UNIT/ML IJ SOLN
10000.0000 [IU] | Freq: Once | INTRAMUSCULAR | Status: AC
  Administered 2018-08-16: 10000 [IU] via SUBCUTANEOUS
  Filled 2018-08-16: qty 1

## 2018-08-16 MED ORDER — EPOETIN ALFA-EPBX 10000 UNIT/ML IJ SOLN: 15000.0000 [IU] | INTRAMUSCULAR | Status: DC

## 2018-08-16 MED ORDER — EPOETIN ALFA-EPBX 2000 UNIT/ML IJ SOLN
2000.0000 [IU] | Freq: Once | INTRAMUSCULAR | Status: AC
  Administered 2018-08-16: 2000 [IU] via SUBCUTANEOUS
  Filled 2018-08-16: qty 1

## 2018-08-19 MED FILL — UNIFINE PENTIPS 8MM 31G: 31G X 8 MM | 17 days supply | Qty: 100 | Fill #8

## 2018-08-30 ENCOUNTER — Ambulatory Visit (HOSPITAL_COMMUNITY)
Admission: RE | Admit: 2018-08-30 | Discharge: 2018-08-30 | Disposition: A | Discharge status: Home / Self Care | Payer: Medicare Other | Source: Ambulatory Visit | Attending: Nephrology | Admitting: Nephrology

## 2018-08-30 VITALS — BP 161/107 | HR 122 | Temp 98.7°F | Resp 20

## 2018-08-30 DIAGNOSIS — N179 Acute kidney failure, unspecified: Secondary | ICD-10-CM | POA: Insufficient documentation

## 2018-08-30 LAB — IRON AND TIBC
Iron: 47 ug/dL (ref 28–170)
Saturation Ratios: 18 % (ref 10.4–31.8)
TIBC: 260 ug/dL (ref 250–450)
UIBC: 213 ug/dL

## 2018-08-30 LAB — POCT HEMOGLOBIN-HEMACUE: Hemoglobin: 11.5 g/dL — ABNORMAL LOW (ref 12.0–15.0)

## 2018-08-30 LAB — FERRITIN: Ferritin: 161 ng/mL (ref 11–307)

## 2018-08-30 MED ORDER — EPOETIN ALFA-EPBX 10000 UNIT/ML IJ SOLN
10000.0000 [IU] | Freq: Once | INTRAMUSCULAR | Status: AC
  Administered 2018-08-30: 10000 [IU] via SUBCUTANEOUS
  Filled 2018-08-30: qty 1

## 2018-08-30 MED ORDER — EPOETIN ALFA-EPBX 3000 UNIT/ML IJ SOLN
3000.0000 [IU] | Freq: Once | INTRAMUSCULAR | Status: AC
  Administered 2018-08-30: 3000 [IU] via SUBCUTANEOUS
  Filled 2018-08-30: qty 1

## 2018-08-30 MED ORDER — EPOETIN ALFA-EPBX 2000 UNIT/ML IJ SOLN
2000.0000 [IU] | Freq: Once | INTRAMUSCULAR | Status: AC
  Administered 2018-08-30: 2000 [IU] via SUBCUTANEOUS
  Filled 2018-08-30: qty 1

## 2018-08-30 MED ORDER — EPOETIN ALFA-EPBX 10000 UNIT/ML IJ SOLN: 15000.0000 [IU] | INTRAMUSCULAR | Status: DC

## 2018-08-30 MED FILL — HYDROCODON-APAP 10-325: 10-325 | 16 days supply | Qty: 100 | Fill #0

## 2018-08-30 MED FILL — ORPHENADRINE 100 MG TAB SA: 100 | 30 days supply | Qty: 60 | Fill #0

## 2018-09-13 ENCOUNTER — Encounter (HOSPITAL_COMMUNITY): Payer: Medicare Other

## 2018-09-13 MED FILL — ATORVASTATIN CALCIUM 20 MG: 20 | 90 days supply | Qty: 90 | Fill #2

## 2018-09-13 MED FILL — LOSARTAN POTASSIUM 50 MG TA: 50 | 30 days supply | Qty: 30 | Fill #3

## 2018-09-16 MED FILL — tiZANidine HCL 4 MG TABS: 4 | 90 days supply | Qty: 270 | Fill #1

## 2018-09-16 MED FILL — METOCLOPRAMIDE 5 MG TABLET: 5 | 30 days supply | Qty: 120 | Fill #6

## 2018-09-17 ENCOUNTER — Inpatient Hospital Stay (HOSPITAL_COMMUNITY): Admission: RE | Admit: 2018-09-17 | Payer: Medicare Other | Source: Ambulatory Visit

## 2018-09-19 MED FILL — FLUoxetine HCL 20 MG CAPS: 20 | 90 days supply | Qty: 90 | Fill #1

## 2018-09-23 MED FILL — HYDROCODON-APAP 10-325: 10-325 | 20 days supply | Qty: 120 | Fill #0

## 2018-09-25 ENCOUNTER — Encounter (HOSPITAL_COMMUNITY)
Admission: RE | Admit: 2018-09-25 | Discharge: 2018-09-25 | Disposition: A | Discharge status: Home / Self Care | Payer: Medicare Other | Source: Ambulatory Visit | Attending: Nephrology | Admitting: Nephrology

## 2018-09-25 VITALS — BP 173/100 | HR 115 | Temp 98.8°F | Resp 20

## 2018-09-25 DIAGNOSIS — D631 Anemia in chronic kidney disease: Secondary | ICD-10-CM | POA: Diagnosis not present

## 2018-09-25 DIAGNOSIS — N179 Acute kidney failure, unspecified: Secondary | ICD-10-CM | POA: Insufficient documentation

## 2018-09-25 LAB — POCT HEMOGLOBIN-HEMACUE: Hemoglobin: 10.9 g/dL — ABNORMAL LOW (ref 12.0–15.0)

## 2018-09-25 MED ORDER — EPOETIN ALFA-EPBX 2000 UNIT/ML IJ SOLN
2000.0000 [IU] | INTRAMUSCULAR | Status: DC
  Administered 2018-09-25: 2000 [IU] via SUBCUTANEOUS
  Filled 2018-09-25: qty 1

## 2018-09-25 MED ORDER — EPOETIN ALFA-EPBX 10000 UNIT/ML IJ SOLN
10000.0000 [IU] | INTRAMUSCULAR | Status: DC
  Filled 2018-09-25: qty 1

## 2018-09-25 MED ORDER — EPOETIN ALFA-EPBX 3000 UNIT/ML IJ SOLN
3000.0000 [IU] | INTRAMUSCULAR | Status: DC
  Administered 2018-09-25: 3000 [IU] via SUBCUTANEOUS
  Filled 2018-09-25: qty 1

## 2018-09-25 MED ORDER — EPOETIN ALFA-EPBX 10000 UNIT/ML IJ SOLN
15000.0000 [IU] | INTRAMUSCULAR | Status: DC
  Administered 2018-09-25: 10000 [IU] via SUBCUTANEOUS

## 2018-09-27 ENCOUNTER — Encounter (HOSPITAL_COMMUNITY): Payer: Medicare Other

## 2018-10-01 ENCOUNTER — Encounter (HOSPITAL_COMMUNITY): Payer: Medicare Other

## 2018-10-07 MED FILL — LOSARTAN POTASSIUM 50 MG TA: 50 | 30 days supply | Qty: 30 | Fill #4

## 2018-10-09 ENCOUNTER — Inpatient Hospital Stay (HOSPITAL_COMMUNITY): Admission: RE | Admit: 2018-10-09 | Payer: Medicare Other | Source: Ambulatory Visit

## 2018-10-16 MED FILL — CARVEDILOL 3.125 MG TABLET: 3.125 | 30 days supply | Qty: 60 | Fill #0

## 2018-10-18 ENCOUNTER — Encounter (HOSPITAL_COMMUNITY): Payer: Medicare Other

## 2018-10-21 MED FILL — VIMPAT 200 MG TABLET: 200 | 90 days supply | Qty: 180 | Fill #1

## 2018-10-23 ENCOUNTER — Encounter (HOSPITAL_COMMUNITY): Payer: Medicare Other

## 2018-10-25 MED FILL — HYDROCODON-APAP 10-325: 10-325 | 20 days supply | Qty: 120 | Fill #0

## 2018-10-30 MED FILL — LOSARTAN POTASSIUM 50 MG TA: 50 | 30 days supply | Qty: 30 | Fill #5

## 2018-10-31 ENCOUNTER — Encounter (HOSPITAL_COMMUNITY): Payer: Medicare Other

## 2018-11-05 MED FILL — TORSEMIDE 100 MG TABLET: 100 | 90 days supply | Qty: 90 | Fill #0

## 2018-11-05 MED FILL — metOLazone 2.5 MG TABS: 2.5 | 90 days supply | Qty: 90 | Fill #0

## 2018-11-06 ENCOUNTER — Encounter (HOSPITAL_COMMUNITY): Payer: Medicare Other

## 2018-11-08 ENCOUNTER — Encounter (HOSPITAL_COMMUNITY): Payer: Medicare Other

## 2018-11-14 ENCOUNTER — Encounter (HOSPITAL_COMMUNITY): Payer: Medicare Other

## 2018-11-18 MED FILL — CARVEDILOL 3.125 MG TABLET: 3.125 | 30 days supply | Qty: 60 | Fill #1

## 2018-11-21 MED FILL — TOUJEO SOLOSTAR 300 UNITS/M: 300 | 30 days supply | Qty: 6 | Fill #0

## 2018-11-22 ENCOUNTER — Encounter (HOSPITAL_COMMUNITY): Payer: Medicare Other

## 2018-11-22 MED FILL — LOSARTAN POTASSIUM 50 MG TA: 50 | 30 days supply | Qty: 30 | Fill #6

## 2018-11-25 MED FILL — HYDROCODON-APAP 10-325: 10-325 | 20 days supply | Qty: 120 | Fill #0

## 2018-12-10 MED FILL — tiZANidine HCL 4 MG TABS: 4 | 90 days supply | Qty: 270 | Fill #2

## 2018-12-10 MED FILL — ATORVASTATIN 20 MG TABLET: 20 | 90 days supply | Qty: 90 | Fill #3

## 2018-12-11 MED FILL — CARVEDILOL 3.125 MG TABLET: 3.125 | 30 days supply | Qty: 60 | Fill #2

## 2018-12-23 MED FILL — FLUoxetine HCL 20 MG CAPS: 20 | 90 days supply | Qty: 90 | Fill #2

## 2018-12-23 MED FILL — LOSARTAN POTASSIUM 50 MG TA: 50 | 30 days supply | Qty: 30 | Fill #7

## 2018-12-23 MED FILL — TOUJEO SOLOSTAR 300 UNITS/M: 300 | 30 days supply | Qty: 6 | Fill #1

## 2018-12-24 MED FILL — HYDROCODON-APAP 10-325: 10-325 | 30 days supply | Qty: 120 | Fill #0

## 2018-12-25 MED FILL — PROMETHAZINE 25 MG TABLET: 25 | 90 days supply | Qty: 360 | Fill #0

## 2018-12-25 MED FILL — FLUoxetine HCL 10 MG CAPS: 10 | 90 days supply | Qty: 90 | Fill #0

## 2019-01-02 MED FILL — DONEPEZIL HCL 5 MG TABLET: 5 | 30 days supply | Qty: 30 | Fill #0

## 2019-01-02 MED FILL — VIMPAT 200 MG TABLET: 200 | 30 days supply | Qty: 60 | Fill #0

## 2019-01-06 MED FILL — CARVEDILOL 3.125 MG TABLET: 3.125 | 30 days supply | Qty: 60 | Fill #3

## 2019-01-20 MED FILL — LOSARTAN POTASSIUM 50 MG TA: 50 | 30 days supply | Qty: 30 | Fill #8

## 2019-01-21 MED FILL — METOCLOPRAMIDE 5 MG TABLET: 5 | 90 days supply | Qty: 270 | Fill #0

## 2019-01-23 MED FILL — HYDROCODON-APAP 10-325: 10-325 | 30 days supply | Qty: 120 | Fill #0

## 2019-02-03 MED FILL — CARVEDILOL 3.125 MG TABLET: 3.125 | 30 days supply | Qty: 60 | Fill #4

## 2019-02-03 MED FILL — DONEPEZIL HCL 5 MG TABLET: 5 | 30 days supply | Qty: 30 | Fill #1

## 2019-02-04 MED FILL — VIMPAT 200 MG TABLET: 200 | 30 days supply | Qty: 60 | Fill #1

## 2019-02-13 MED FILL — DONEPEZIL HCL 10 MG TABLET: 10 | 30 days supply | Qty: 30 | Fill #0

## 2019-02-13 MED FILL — LOSARTAN POTASSIUM 50 MG TA: 50 | 30 days supply | Qty: 30 | Fill #9

## 2019-02-21 MED FILL — HYDROCODON-APAP 10-325: 10-325 | 20 days supply | Qty: 120 | Fill #0

## 2019-03-03 MED FILL — CARVEDILOL 3.125 MG TABLET: 3.125 | 30 days supply | Qty: 60 | Fill #5

## 2019-03-03 MED FILL — tiZANidine HCL 4 MG TABS: 4 | 90 days supply | Qty: 270 | Fill #3

## 2019-03-04 MED FILL — VIMPAT 200 MG TABLET: 200 | 30 days supply | Qty: 60 | Fill #2

## 2019-03-10 MED FILL — LOSARTAN POTASSIUM 50 MG TA: 50 | 30 days supply | Qty: 30 | Fill #10

## 2019-03-10 MED FILL — DONEPEZIL HCL 10 MG TABLET: 10 | 30 days supply | Qty: 30 | Fill #1

## 2019-03-18 MED FILL — FLUoxetine HCL 20 MG CAPS: 20 | 90 days supply | Qty: 90 | Fill #3

## 2019-03-18 MED FILL — FLUoxetine HCL 10 MG CAPS: 10 | 90 days supply | Qty: 90 | Fill #1

## 2019-03-19 MED FILL — PROMETHAZINE 25 MG TABLET: 25 | 90 days supply | Qty: 360 | Fill #0

## 2019-03-22 MED FILL — HYDROCODON-APAP 10-325: 10-325 | 20 days supply | Qty: 120 | Fill #0

## 2019-03-24 MED FILL — ATORVASTATIN 20 MG TABLET: 20 | 90 days supply | Qty: 90 | Fill #0

## 2019-04-02 MED FILL — HUMALOG 100 UNITS/ML KWIKPE: 100 | 30 days supply | Qty: 9 | Fill #0

## 2019-04-02 MED FILL — LOSARTAN POTASSIUM 50 MG TA: 50 | 30 days supply | Qty: 30 | Fill #11

## 2019-04-02 MED FILL — VIMPAT 200 MG TABLET: 200 | 30 days supply | Qty: 60 | Fill #3

## 2019-04-02 MED FILL — DONEPEZIL HCL 10 MG TABLET: 10 | 30 days supply | Qty: 30 | Fill #2

## 2019-04-02 MED FILL — TOUJEO SOLOSTAR 300 UNITS/M: 300 | 30 days supply | Qty: 6 | Fill #2

## 2019-04-15 MED FILL — METOCLOPRAMIDE 5 MG TABLET: 5 | 90 days supply | Qty: 270 | Fill #1

## 2019-04-24 MED FILL — HYDROCODON-APAP 10-325: 10-325 | 20 days supply | Qty: 120 | Fill #0

## 2019-04-25 MED FILL — DONEPEZIL HCL 10 MG TABLET: 10 | 30 days supply | Qty: 30 | Fill #3

## 2019-04-28 MED FILL — CARVEDILOL 3.125 MG TABLET: 3.125 | 30 days supply | Qty: 60 | Fill #0

## 2019-05-02 MED FILL — LOSARTAN POTASSIUM 50 MG TA: 50 | 30 days supply | Qty: 30 | Fill #0

## 2019-05-02 MED FILL — VIMPAT 200 MG TABLET: 200 | 30 days supply | Qty: 60 | Fill #4

## 2019-05-22 MED FILL — HYDROCODON-APAP 10-325: 10-325 | 20 days supply | Qty: 120 | Fill #0

## 2019-05-22 MED FILL — DONEPEZIL HCL 10 MG TABLET: 10 | 30 days supply | Qty: 30 | Fill #4

## 2019-05-23 MED FILL — CARVEDILOL 3.125 MG TABLET: 3.125 | 30 days supply | Qty: 60 | Fill #1

## 2019-05-29 MED FILL — LOSARTAN POTASSIUM 50 MG TA: 50 | 30 days supply | Qty: 30 | Fill #1

## 2019-05-30 MED FILL — tiZANidine HCL 4 MG TABS: 4 | 90 days supply | Qty: 270 | Fill #0

## 2019-05-30 MED FILL — VIMPAT 200 MG TABLET: 200 | 30 days supply | Qty: 60 | Fill #5

## 2019-06-09 MED FILL — FLUoxetine HCL 10 MG CAPS: 10 | 90 days supply | Qty: 90 | Fill #2

## 2019-06-16 MED FILL — CARVEDILOL 3.125 MG TABLET: 3.125 | 30 days supply | Qty: 60 | Fill #2

## 2019-06-16 MED FILL — DONEPEZIL HCL 10 MG TABLET: 10 | 30 days supply | Qty: 30 | Fill #5

## 2019-06-17 MED FILL — PROMETHAZINE 25 MG TABLET: 25 | 90 days supply | Qty: 360 | Fill #0

## 2019-06-19 MED FILL — HYDROCODON-APAP 10-325: 10-325 | 20 days supply | Qty: 120 | Fill #0

## 2019-06-24 MED FILL — LOSARTAN POTASSIUM 50 MG TA: 50 | 30 days supply | Qty: 30 | Fill #2

## 2019-06-30 MED FILL — VIMPAT 200 MG TABLET: 200 | 90 days supply | Qty: 180 | Fill #0

## 2019-06-30 MED FILL — ATORVASTATIN 20 MG TABLET: 20 | 90 days supply | Qty: 90 | Fill #0

## 2019-07-14 MED FILL — CARVEDILOL 3.125 MG TABLET: 3.125 | 30 days supply | Qty: 60 | Fill #3

## 2019-07-15 MED FILL — DONEPEZIL HCL 10 MG TABLET: 10 | 30 days supply | Qty: 30 | Fill #0

## 2019-07-18 MED FILL — LOSARTAN POTASSIUM 50 MG TA: 50 | 30 days supply | Qty: 30 | Fill #3

## 2019-07-22 MED FILL — HYDROCODON-APAP 10-325: 10-325 | 10 days supply | Qty: 120 | Fill #0

## 2019-07-22 MED FILL — METOCLOPRAMIDE 5 MG TABLET: 5 | 90 days supply | Qty: 270 | Fill #0

## 2019-08-08 MED FILL — CARVEDILOL 3.125 MG TABLET: 3.125 | 30 days supply | Qty: 60 | Fill #4

## 2019-08-08 MED FILL — DONEPEZIL HCL 10 MG TABLET: 10 | 30 days supply | Qty: 30 | Fill #1

## 2019-08-12 MED FILL — LOSARTAN POTASSIUM 50 MG TA: 50 | 30 days supply | Qty: 30 | Fill #4

## 2019-08-14 MED FILL — HYDROCODON-APAP 10-325: 10-325 | 20 days supply | Qty: 120 | Fill #0

## 2019-08-22 MED FILL — tiZANidine HCL 4 MG TABS: 4 | 90 days supply | Qty: 270 | Fill #1

## 2019-09-01 MED FILL — CARVEDILOL 3.125 MG TABLET: 3.125 | 30 days supply | Qty: 60 | Fill #5

## 2019-09-01 MED FILL — FLUoxetine HCL 10 MG CAPS: 10 | 90 days supply | Qty: 90 | Fill #3

## 2019-09-01 MED FILL — DONEPEZIL HCL 10 MG TABLET: 10 | 30 days supply | Qty: 30 | Fill #2

## 2019-09-10 MED FILL — PROMETHAZINE 25 MG TABLET: 25 | 90 days supply | Qty: 360 | Fill #0

## 2019-09-11 MED FILL — LOSARTAN POTASSIUM 50 MG TA: 50 | 90 days supply | Qty: 90 | Fill #5

## 2019-09-11 MED FILL — HYDROCODON-APAP 10-325: 10-325 | 20 days supply | Qty: 120 | Fill #0

## 2019-09-26 MED FILL — DONEPEZIL HCL 10 MG TABLET: 10 | 30 days supply | Qty: 30 | Fill #3

## 2019-09-26 MED FILL — VIMPAT 200 MG TABLET: 200 | 90 days supply | Qty: 180 | Fill #1

## 2019-09-29 MED FILL — CARVEDILOL 3.125 MG TABLET: 3.125 | 30 days supply | Qty: 60 | Fill #0

## 2019-09-29 MED FILL — ATORVASTATIN 20 MG TABLET: 20 | 90 days supply | Qty: 90 | Fill #0

## 2019-10-09 MED FILL — HYDROCODON-APAP 10-325: 10-325 | 20 days supply | Qty: 120 | Fill #0

## 2019-10-17 MED FILL — METOCLOPRAMIDE 5 MG TABLET: 5 | 90 days supply | Qty: 270 | Fill #1

## 2019-10-28 MED FILL — DONEPEZIL HCL 10 MG TABLET: 10 | 30 days supply | Qty: 30 | Fill #4

## 2019-10-30 MED FILL — metOLazone 2.5 MG TABS: 2.5 | 90 days supply | Qty: 90 | Fill #0

## 2019-11-06 MED FILL — HYDROCODON-APAP 10-325: 10-325 | 20 days supply | Qty: 120 | Fill #0

## 2019-11-14 MED FILL — tiZANidine HCL 4 MG TABS: 4 | 90 days supply | Qty: 270 | Fill #2

## 2019-11-24 MED FILL — DONEPEZIL HCL 10 MG TABLET: 10 | 30 days supply | Qty: 30 | Fill #5

## 2019-11-24 MED FILL — CARVEDILOL 3.125 MG TABLET: 3.125 | 30 days supply | Qty: 60 | Fill #0

## 2019-12-03 MED FILL — LOSARTAN POTASSIUM 50 MG TA: 50 | 90 days supply | Qty: 90 | Fill #6

## 2019-12-04 MED FILL — PROMETHAZINE 25 MG TABLET: 25 | 90 days supply | Qty: 360 | Fill #0

## 2019-12-05 MED FILL — HYDROCODON-APAP 10-325: 10-325 | 20 days supply | Qty: 120 | Fill #0

## 2019-12-16 ENCOUNTER — Other Ambulatory Visit (HOSPITAL_COMMUNITY): Payer: Self-pay | Admitting: Internal Medicine

## 2019-12-16 MED FILL — FLUoxetine HCL 10 MG CAPS: 10 | 90 days supply | Qty: 90 | Fill #0

## 2019-12-22 MED FILL — ATORVASTATIN 20 MG TABLET: 20 | 90 days supply | Qty: 90 | Fill #0

## 2019-12-22 MED FILL — CARVEDILOL 3.125 MG TABLET: 3.125 | 30 days supply | Qty: 60 | Fill #0

## 2020-01-02 MED FILL — HYDROCODON-APAP 10-325: 10-325 | 20 days supply | Qty: 120 | Fill #0

## 2020-01-05 ENCOUNTER — Inpatient Hospital Stay (HOSPITAL_COMMUNITY)
Admission: EM | Admit: 2020-01-05 | Discharge: 2020-01-09 | DRG: 689 | Disposition: A | Discharge status: Home Health | Payer: Medicare Other | Attending: Internal Medicine | Admitting: Internal Medicine

## 2020-01-05 ENCOUNTER — Encounter (HOSPITAL_COMMUNITY): Payer: Self-pay | Admitting: *Deleted

## 2020-01-05 ENCOUNTER — Other Ambulatory Visit: Payer: Self-pay

## 2020-01-05 DIAGNOSIS — F40240 Claustrophobia: Secondary | ICD-10-CM | POA: Diagnosis present

## 2020-01-05 DIAGNOSIS — Z87891 Personal history of nicotine dependence: Secondary | ICD-10-CM

## 2020-01-05 DIAGNOSIS — Z9981 Dependence on supplemental oxygen: Secondary | ICD-10-CM | POA: Diagnosis not present

## 2020-01-05 DIAGNOSIS — G8929 Other chronic pain: Secondary | ICD-10-CM | POA: Diagnosis present

## 2020-01-05 DIAGNOSIS — Z6841 Body Mass Index (BMI) 40.0 and over, adult: Secondary | ICD-10-CM | POA: Diagnosis not present

## 2020-01-05 DIAGNOSIS — E119 Type 2 diabetes mellitus without complications: Secondary | ICD-10-CM

## 2020-01-05 DIAGNOSIS — I130 Hypertensive heart and chronic kidney disease with heart failure and stage 1 through stage 4 chronic kidney disease, or unspecified chronic kidney disease: Secondary | ICD-10-CM | POA: Diagnosis present

## 2020-01-05 DIAGNOSIS — Z993 Dependence on wheelchair: Secondary | ICD-10-CM

## 2020-01-05 DIAGNOSIS — Z8673 Personal history of transient ischemic attack (TIA), and cerebral infarction without residual deficits: Secondary | ICD-10-CM

## 2020-01-05 DIAGNOSIS — I5032 Chronic diastolic (congestive) heart failure: Secondary | ICD-10-CM | POA: Diagnosis present

## 2020-01-05 DIAGNOSIS — N390 Urinary tract infection, site not specified: Principal | ICD-10-CM | POA: Diagnosis present

## 2020-01-05 DIAGNOSIS — Z87440 Personal history of urinary (tract) infections: Secondary | ICD-10-CM

## 2020-01-05 DIAGNOSIS — F0150 Vascular dementia without behavioral disturbance: Secondary | ICD-10-CM | POA: Diagnosis not present

## 2020-01-05 DIAGNOSIS — E1143 Type 2 diabetes mellitus with diabetic autonomic (poly)neuropathy: Secondary | ICD-10-CM | POA: Diagnosis present

## 2020-01-05 DIAGNOSIS — D860 Sarcoidosis of lung: Secondary | ICD-10-CM | POA: Diagnosis present

## 2020-01-05 DIAGNOSIS — H5461 Unqualified visual loss, right eye, normal vision left eye: Secondary | ICD-10-CM | POA: Diagnosis present

## 2020-01-05 DIAGNOSIS — J9611 Chronic respiratory failure with hypoxia: Secondary | ICD-10-CM | POA: Diagnosis present

## 2020-01-05 DIAGNOSIS — K3184 Gastroparesis: Secondary | ICD-10-CM | POA: Diagnosis present

## 2020-01-05 DIAGNOSIS — M549 Dorsalgia, unspecified: Secondary | ICD-10-CM | POA: Diagnosis present

## 2020-01-05 DIAGNOSIS — R531 Weakness: Secondary | ICD-10-CM

## 2020-01-05 DIAGNOSIS — Z87898 Personal history of other specified conditions: Secondary | ICD-10-CM | POA: Diagnosis not present

## 2020-01-05 DIAGNOSIS — E669 Obesity, unspecified: Secondary | ICD-10-CM | POA: Diagnosis present

## 2020-01-05 DIAGNOSIS — Z9049 Acquired absence of other specified parts of digestive tract: Secondary | ICD-10-CM

## 2020-01-05 DIAGNOSIS — R569 Unspecified convulsions: Secondary | ICD-10-CM | POA: Diagnosis present

## 2020-01-05 DIAGNOSIS — E860 Dehydration: Secondary | ICD-10-CM | POA: Diagnosis present

## 2020-01-05 DIAGNOSIS — N179 Acute kidney failure, unspecified: Secondary | ICD-10-CM | POA: Diagnosis present

## 2020-01-05 DIAGNOSIS — J9610 Chronic respiratory failure, unspecified whether with hypoxia or hypercapnia: Secondary | ICD-10-CM | POA: Diagnosis not present

## 2020-01-05 DIAGNOSIS — G4733 Obstructive sleep apnea (adult) (pediatric): Secondary | ICD-10-CM | POA: Diagnosis present

## 2020-01-05 DIAGNOSIS — E876 Hypokalemia: Secondary | ICD-10-CM | POA: Diagnosis not present

## 2020-01-05 DIAGNOSIS — R609 Edema, unspecified: Secondary | ICD-10-CM | POA: Diagnosis not present

## 2020-01-05 DIAGNOSIS — G9341 Metabolic encephalopathy: Secondary | ICD-10-CM | POA: Diagnosis present

## 2020-01-05 DIAGNOSIS — E1122 Type 2 diabetes mellitus with diabetic chronic kidney disease: Secondary | ICD-10-CM | POA: Diagnosis present

## 2020-01-05 DIAGNOSIS — R112 Nausea with vomiting, unspecified: Secondary | ICD-10-CM

## 2020-01-05 DIAGNOSIS — N1831 Chronic kidney disease, stage 3a: Secondary | ICD-10-CM | POA: Diagnosis present

## 2020-01-05 DIAGNOSIS — E785 Hyperlipidemia, unspecified: Secondary | ICD-10-CM | POA: Diagnosis present

## 2020-01-05 DIAGNOSIS — G9389 Other specified disorders of brain: Secondary | ICD-10-CM | POA: Diagnosis present

## 2020-01-05 DIAGNOSIS — Z833 Family history of diabetes mellitus: Secondary | ICD-10-CM

## 2020-01-05 DIAGNOSIS — D631 Anemia in chronic kidney disease: Secondary | ICD-10-CM | POA: Diagnosis present

## 2020-01-05 DIAGNOSIS — K219 Gastro-esophageal reflux disease without esophagitis: Secondary | ICD-10-CM | POA: Diagnosis present

## 2020-01-05 DIAGNOSIS — Z882 Allergy status to sulfonamides status: Secondary | ICD-10-CM

## 2020-01-05 DIAGNOSIS — Z8249 Family history of ischemic heart disease and other diseases of the circulatory system: Secondary | ICD-10-CM

## 2020-01-05 DIAGNOSIS — Z888 Allergy status to other drugs, medicaments and biological substances status: Secondary | ICD-10-CM

## 2020-01-05 DIAGNOSIS — I5189 Other ill-defined heart diseases: Secondary | ICD-10-CM

## 2020-01-05 DIAGNOSIS — M1990 Unspecified osteoarthritis, unspecified site: Secondary | ICD-10-CM | POA: Diagnosis present

## 2020-01-05 DIAGNOSIS — Z794 Long term (current) use of insulin: Secondary | ICD-10-CM

## 2020-01-05 DIAGNOSIS — Z66 Do not resuscitate: Secondary | ICD-10-CM | POA: Diagnosis present

## 2020-01-05 DIAGNOSIS — Z79899 Other long term (current) drug therapy: Secondary | ICD-10-CM

## 2020-01-05 DIAGNOSIS — B9620 Unspecified Escherichia coli [E. coli] as the cause of diseases classified elsewhere: Secondary | ICD-10-CM | POA: Diagnosis present

## 2020-01-05 DIAGNOSIS — M545 Low back pain, unspecified: Secondary | ICD-10-CM

## 2020-01-05 DIAGNOSIS — R197 Diarrhea, unspecified: Secondary | ICD-10-CM | POA: Diagnosis not present

## 2020-01-05 DIAGNOSIS — E1169 Type 2 diabetes mellitus with other specified complication: Secondary | ICD-10-CM | POA: Diagnosis not present

## 2020-01-05 DIAGNOSIS — R1111 Vomiting without nausea: Secondary | ICD-10-CM

## 2020-01-05 DIAGNOSIS — M7989 Other specified soft tissue disorders: Secondary | ICD-10-CM | POA: Diagnosis not present

## 2020-01-05 DIAGNOSIS — L819 Disorder of pigmentation, unspecified: Secondary | ICD-10-CM | POA: Diagnosis not present

## 2020-01-05 LAB — URINALYSIS, ROUTINE W REFLEX MICROSCOPIC
Bilirubin Urine: NEGATIVE
Glucose, UA: 50 mg/dL — AB
Ketones, ur: 5 mg/dL — AB
Nitrite: NEGATIVE
Protein, ur: >=300 mg/dL — AB
Specific Gravity, Urine: 1.02 (ref 1.005–1.030)
WBC, UA: >50 WBC/hpf — ABNORMAL HIGH (ref 0–5)
pH: 5 (ref 5.0–8.0)

## 2020-01-05 LAB — BASIC METABOLIC PANEL
Anion gap: 9 (ref 5–15)
BUN: 16 mg/dL (ref 6–20)
CO2: 26 mmol/L (ref 22–32)
Calcium: 7.8 mg/dL — ABNORMAL LOW (ref 8.9–10.3)
Chloride: 108 mmol/L (ref 98–111)
Creatinine, Ser: 1.35 mg/dL — ABNORMAL HIGH (ref 0.44–1.00)
GFR calc Af Amer: 50 mL/min — ABNORMAL LOW (ref >60)
GFR calc non Af Amer: 43 mL/min — ABNORMAL LOW (ref >60)
Glucose, Bld: 108 mg/dL — ABNORMAL HIGH (ref 70–99)
Potassium: 3.3 mmol/L — ABNORMAL LOW (ref 3.5–5.1)
Sodium: 143 mmol/L (ref 135–145)

## 2020-01-05 LAB — CBC WITH DIFFERENTIAL/PLATELET
Abs Immature Granulocytes: 0.03 10*3/uL (ref 0.00–0.07)
Basophils Absolute: 0 10*3/uL (ref 0.0–0.1)
Basophils Relative: 1 %
Eosinophils Absolute: 0.1 10*3/uL (ref 0.0–0.5)
Eosinophils Relative: 2 %
HCT: 34.3 % — ABNORMAL LOW (ref 36.0–46.0)
Hemoglobin: 10.4 g/dL — ABNORMAL LOW (ref 12.0–15.0)
Immature Granulocytes: 0 %
Lymphocytes Relative: 11 %
Lymphs Abs: 0.9 10*3/uL (ref 0.7–4.0)
MCH: 31.1 pg (ref 26.0–34.0)
MCHC: 30.3 g/dL (ref 30.0–36.0)
MCV: 102.7 fL — ABNORMAL HIGH (ref 80.0–100.0)
Monocytes Absolute: 0.7 10*3/uL (ref 0.1–1.0)
Monocytes Relative: 9 %
Neutro Abs: 6.2 10*3/uL (ref 1.7–7.7)
Neutrophils Relative %: 77 %
Platelets: 157 10*3/uL (ref 150–400)
RBC: 3.34 MIL/uL — ABNORMAL LOW (ref 3.87–5.11)
RDW: 13.3 % (ref 11.5–15.5)
WBC: 8 10*3/uL (ref 4.0–10.5)
nRBC: 0 % (ref 0.0–0.2)

## 2020-01-05 LAB — HEPATIC FUNCTION PANEL
ALT: 18 U/L (ref 0–44)
AST: 32 U/L (ref 15–41)
Albumin: 1.6 g/dL — ABNORMAL LOW (ref 3.5–5.0)
Alkaline Phosphatase: 77 U/L (ref 38–126)
Bilirubin, Direct: 0.1 mg/dL (ref 0.0–0.2)
Indirect Bilirubin: 0.4 mg/dL (ref 0.3–0.9)
Total Bilirubin: 0.5 mg/dL (ref 0.3–1.2)
Total Protein: 5.8 g/dL — ABNORMAL LOW (ref 6.5–8.1)

## 2020-01-05 LAB — LIPASE, BLOOD: Lipase: 15 U/L (ref 11–51)

## 2020-01-05 LAB — GLUCOSE, CAPILLARY
Glucose-Capillary: 67 mg/dL — ABNORMAL LOW (ref 70–99)
Glucose-Capillary: 73 mg/dL (ref 70–99)

## 2020-01-05 LAB — HEMOGLOBIN A1C
Hgb A1c MFr Bld: 4.7 % — ABNORMAL LOW (ref 4.8–5.6)
Mean Plasma Glucose: 88.19 mg/dL

## 2020-01-05 MED ORDER — FLUOXETINE HCL 10 MG PO CAPS
10.0000 mg | ORAL_CAPSULE | Freq: Every day | ORAL | Status: DC
  Administered 2020-01-06 – 2020-01-09 (×4): 10 mg via ORAL
  Filled 2020-01-05 (×4): qty 1

## 2020-01-05 MED ORDER — SODIUM CHLORIDE 0.9 % IV SOLN
1.0000 g | Freq: Once | INTRAVENOUS | Status: AC
  Administered 2020-01-05: 1 g via INTRAVENOUS
  Filled 2020-01-05: qty 10

## 2020-01-05 MED ORDER — DIPHENHYDRAMINE HCL 50 MG/ML IJ SOLN
25.0000 mg | Freq: Once | INTRAMUSCULAR | Status: AC
  Administered 2020-01-05: 25 mg via INTRAVENOUS
  Filled 2020-01-05: qty 1

## 2020-01-05 MED ORDER — METOCLOPRAMIDE HCL 5 MG/ML IJ SOLN
5.0000 mg | Freq: Once | INTRAMUSCULAR | Status: AC
  Administered 2020-01-05: 5 mg via INTRAVENOUS
  Filled 2020-01-05: qty 2

## 2020-01-05 MED ORDER — LACOSAMIDE 50 MG PO TABS
200.0000 mg | ORAL_TABLET | Freq: Two times a day (BID) | ORAL | Status: DC
  Administered 2020-01-05 – 2020-01-09 (×8): 200 mg via ORAL
  Filled 2020-01-05 (×8): qty 4

## 2020-01-05 MED ORDER — METOCLOPRAMIDE HCL 10 MG PO TABS: 5.0000 mg | ORAL_TABLET | Freq: Two times a day (BID) | ORAL | Status: DC

## 2020-01-05 MED ORDER — SODIUM CHLORIDE 0.9 % IV SOLN
1.0000 g | INTRAVENOUS | Status: DC
  Administered 2020-01-06 – 2020-01-09 (×4): 1 g via INTRAVENOUS
  Filled 2020-01-05 (×4): qty 1

## 2020-01-05 MED ORDER — TIZANIDINE HCL 4 MG PO TABS: 2.0000 mg | ORAL_TABLET | Freq: Three times a day (TID) | ORAL | Status: DC | PRN

## 2020-01-05 MED ORDER — SODIUM CHLORIDE 0.9 % IV BOLUS
1000.0000 mL | Freq: Once | INTRAVENOUS | Status: AC
  Administered 2020-01-05: 1000 mL via INTRAVENOUS

## 2020-01-05 MED ORDER — INSULIN ASPART 100 UNIT/ML ~~LOC~~ SOLN
0.0000 [IU] | Freq: Three times a day (TID) | SUBCUTANEOUS | Status: DC
  Filled 2020-01-05: qty 0.15

## 2020-01-05 MED ORDER — ONDANSETRON HCL 4 MG/2ML IJ SOLN
4.0000 mg | Freq: Four times a day (QID) | INTRAMUSCULAR | Status: DC | PRN
  Administered 2020-01-06: 4 mg via INTRAVENOUS
  Filled 2020-01-05: qty 2

## 2020-01-05 MED ORDER — METOCLOPRAMIDE HCL 10 MG PO TABS
10.0000 mg | ORAL_TABLET | Freq: Every day | ORAL | Status: DC
  Administered 2020-01-06 – 2020-01-09 (×4): 10 mg via ORAL
  Filled 2020-01-05 (×4): qty 1

## 2020-01-05 MED ORDER — ENOXAPARIN SODIUM 40 MG/0.4ML ~~LOC~~ SOLN
40.0000 mg | SUBCUTANEOUS | Status: DC
  Administered 2020-01-05 – 2020-01-08 (×4): 40 mg via SUBCUTANEOUS
  Filled 2020-01-05 (×4): qty 0.4

## 2020-01-05 MED ORDER — ATORVASTATIN CALCIUM 10 MG PO TABS
20.0000 mg | ORAL_TABLET | Freq: Every day | ORAL | Status: DC
  Administered 2020-01-05 – 2020-01-09 (×5): 20 mg via ORAL
  Filled 2020-01-05 (×5): qty 2

## 2020-01-05 MED ORDER — LOSARTAN POTASSIUM 50 MG PO TABS: 50.0000 mg | ORAL_TABLET | Freq: Every day | ORAL | Status: DC

## 2020-01-05 MED ORDER — POTASSIUM CHLORIDE 10 MEQ/100ML IV SOLN
10.0000 meq | Freq: Once | INTRAVENOUS | Status: AC
  Administered 2020-01-05: 10 meq via INTRAVENOUS
  Filled 2020-01-05: qty 100

## 2020-01-05 MED ORDER — METOCLOPRAMIDE HCL 5 MG/ML IJ SOLN: 10.0000 mg | Freq: Once | INTRAMUSCULAR | Status: DC

## 2020-01-05 MED ORDER — ONDANSETRON HCL 4 MG/2ML IJ SOLN: 4.0000 mg | Freq: Once | INTRAMUSCULAR | Status: DC

## 2020-01-05 MED ORDER — METOCLOPRAMIDE HCL 5 MG PO TABS
5.0000 mg | ORAL_TABLET | Freq: Every day | ORAL | Status: DC
  Administered 2020-01-05 – 2020-01-08 (×4): 5 mg via ORAL
  Filled 2020-01-05 (×4): qty 1

## 2020-01-05 MED ORDER — CARVEDILOL 3.125 MG PO TABS
3.1250 mg | ORAL_TABLET | Freq: Two times a day (BID) | ORAL | Status: DC
  Administered 2020-01-05 – 2020-01-08 (×6): 3.125 mg via ORAL
  Filled 2020-01-05 (×7): qty 1

## 2020-01-05 MED ORDER — HYDROCODONE-ACETAMINOPHEN 10-325 MG PO TABS
0.5000 | ORAL_TABLET | Freq: Four times a day (QID) | ORAL | Status: DC | PRN
  Administered 2020-01-05 – 2020-01-09 (×8): 1 via ORAL
  Filled 2020-01-05 (×8): qty 1

## 2020-01-05 MED ORDER — ONDANSETRON HCL 4 MG/2ML IJ SOLN
4.0000 mg | Freq: Once | INTRAMUSCULAR | Status: AC
  Administered 2020-01-05: 4 mg via INTRAVENOUS
  Filled 2020-01-05: qty 2

## 2020-01-05 MED ORDER — SODIUM CHLORIDE 0.9 % IV SOLN: INTRAVENOUS | Status: AC

## 2020-01-05 MED ORDER — DONEPEZIL HCL 5 MG PO TABS
10.0000 mg | ORAL_TABLET | Freq: Every day | ORAL | Status: DC
  Administered 2020-01-05 – 2020-01-09 (×5): 10 mg via ORAL
  Filled 2020-01-05 (×5): qty 2

## 2020-01-05 NOTE — ED Provider Notes (Signed)
[facility] DEPT Provider Note   CSN: [id number] Arrival date & time: 01/05/20  1147     History Chief Complaint  Patient presents with  . Emesis  . Weakness    Marissa Wilkerson is a 59 y.o. female.  The history is provided by the patient and a caregiver.  Emesis Severity:  Mild Timing:  Constant Able to tolerate:  Liquids Progression:  Unchanged Chronicity:  Recurrent Recent urination:  Decreased Relieved by:  Nothing Worsened by:  Nothing Associated symptoms: no abdominal pain, no arthralgias, no chills, no cough, no diarrhea, no fever, no headaches, no myalgias, no sore throat and no URI   Risk factors: diabetes (gastroparesis history)        Past Medical History:  Diagnosis Date  . Anemia    05/04/15 hemglobin 8.8  . Arthritis   . Diabetes mellitus without complication (HCC)    insulin dependent, po meds  . Diastolic dysfunction    followed by dr polite found on echo 06-12-13 , grade 1 per pt  . Dyslipidemia   . Edema of extremities    PITTING EDEMA     . GERD (gastroesophageal reflux disease)   . Hypertension   . Sarcoidosis of lung [facility])    pulmonologist Dr.Byrum, 9l16   . Shortness of breath    ON EXERTION , O2 2-3 l/min  . 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 Vertigo, benign positional   . Wheezing    OCC    Patient Active Problem List   Diagnosis Date Noted  . Snoring 04/12/2018  . Chronic rhinitis 04/12/2018  . Nausea and vomiting 10/17/2017  . AKI (acute kidney injury) ([place]) 10/16/2017  . CHF (congestive heart failure), NYHA class I, unspecified failure chronicity, diastolic ([place]) 82/42/3536  . Hyperkalemia 09/15/2017  . CHF (congestive heart failure) ([place]) 09/15/2017  . Hyperlipidemia 08/08/2016  . Bilateral carotid artery disease ([place]) 08/08/2016  . Lower extremity weakness 02/22/2016  . Multiple lung nodules 03/05/2014  . Obstructive lung disease ([place]) 02/09/2014  .  Chronic respiratory failure ([place]) 09/02/2013  . Sarcoidosis of lung ([place]) 09/02/2013  . Hypertension   . Diabetes mellitus without complication ([place])   . Diastolic dysfunction     Past Surgical History:  Procedure Laterality Date  . all teeth extraction  7-8 yrs ago  . CHOLECYSTECTOMY  1996  . COLONOSCOPY WITH PROPOFOL N/A 08/05/2013   Procedure: COLONOSCOPY WITH PROPOFOL;  Surgeon: Garlan Fair, MD;  Location: [facility];  Service: Endoscopy;  Laterality: N/A;  . ESOPHAGOGASTRODUODENOSCOPY (EGD) WITH PROPOFOL N/A 05/31/2015   Procedure: ESOPHAGOGASTRODUODENOSCOPY (EGD) WITH PROPOFOL;  Surgeon: Garlan Fair, MD;  [facility];  Service: Endoscopy;  Laterality: N/A;  . LUNG BIOPSY Left 03/05/2014   Procedure: LUNG BIOPSY;  Surgeon: Gaye Pollack, MD;  Location: [facility];  Service: Thoracic;  Laterality: Left;  [name] VIDEO ASSISTED THORACOSCOPY Left 03/05/2014   Procedure: VIDEO ASSISTED THORACOSCOPY;  Surgeon: Gaye Pollack, MD;  Location: [facility];  Service: Thoracic;  Laterality: Left;  [name] VIDEO BRONCHOSCOPY Bilateral 09/04/2013   Procedure: VIDEO BRONCHOSCOPY WITH FLUORO;  Surgeon: Collene Gobble, MD;  [facility];  Service: Cardiopulmonary;  Laterality: Bilateral;  . WISDOM TOOTH EXTRACTION  yrs ago     OB History   No obstetric history on file.     Family History  Problem Relation Age of Onset  . Heart failure Mother   . Diabetes Father   .  Hypertension Father   . Diabetes Brother   . Hypertension Brother     Social History   Tobacco Use  . Smoking status: Former Smoker    Packs/day: 0.50    Years: 25.00    Pack years: 12.50    Types: Cigarettes    Quit date: 08/21/2012    Years since quitting: 7.3  . Smokeless tobacco: Never Used  Substance Use Topics  . Alcohol use: Yes    Comment: very rare  . Drug use: No    Home Medications Prior to Admission medications   Medication Sig Start Date End Date Taking? Authorizing Provider  albuterol  (PROVENTIL HFA;VENTOLIN HFA) 108 (90 Base) MCG/ACT inhaler Inhale 2 puffs into the lungs every 6 (six) hours as needed for wheezing or shortness of breath. 11/02/17   Collene Gobble, MD  atorvastatin (LIPITOR) 20 MG tablet Take 1 tablet (20 mg total) by mouth daily. 09/28/17   Georgette Shell, MD  carvedilol (COREG) 6.25 MG tablet Take 12.5 mg by mouth 2 (two) times daily with a meal.    [provider]  FLUoxetine (PROZAC) 20 MG tablet Take 20 mg by mouth daily.    [provider]  fluticasone (FLONASE) 50 MCG/ACT nasal spray Place 1 spray into both nostrils daily. 09/29/17   Georgette Shell, MD  HYDROcodone-acetaminophen [facility]) 10-325 MG tablet Take 1 tablet by mouth every 6 (six) hours as needed.    [provider]  Insulin Glargine (TOUJEO SOLOSTAR) 300 UNIT/ML SOPN Inject 25 Units into the skin 2 (two) times daily. 09/28/17   Georgette Shell, MD  insulin lispro (HUMALOG) 100 UNIT/ML injection Inject 15-20 Units into the skin 2 (two) times daily.    [provider]  lacosamide (VIMPAT) 200 MG TABS tablet Take 1 tablet by mouth 2 (two) times daily. 02/08/18   [provider]  losartan (COZAAR) 50 MG tablet Take 50 mg by mouth daily.    [provider]  metoCLOPramide (REGLAN) 5 MG tablet Take 5 mg by mouth 4 (four) times daily.    [provider]  metolazone (ZAROXOLYN) 10 MG tablet Take 10 mg by mouth daily.    [provider]  tiZANidine (ZANAFLEX) 4 MG tablet Take 1 tablet (4 mg total) by mouth every 8 (eight) hours as needed for muscle spasms. 09/28/17   Georgette Shell, MD  torsemide (DEMADEX) 100 MG tablet Take 100 mg by mouth daily.    [provider]    Allergies    Cymbalta [duloxetine hcl], Fentanyl, Metformin and related, Mobic [meloxicam], Sulfa antibiotics, and Voltaren [diclofenac]  Review of Systems   Review of Systems  Constitutional: Negative for chills and fever.  HENT: Negative for  ear pain and sore throat.   Eyes: Negative for pain and visual disturbance.  Respiratory: Negative for cough and shortness of breath.   Cardiovascular: Negative for chest pain and palpitations.  Gastrointestinal: Positive for nausea and vomiting. Negative for abdominal pain and diarrhea.  Genitourinary: Negative for dysuria and hematuria.  Musculoskeletal: Negative for arthralgias, back pain and myalgias.  Skin: Negative for color change and rash.  Neurological: Negative for seizures, syncope and headaches.  All other systems reviewed and are negative.   Physical Exam Updated Vital Signs BP (!) 185/69   Pulse 69   Temp 98.9 F (37.2 C) (Oral)   Resp (!) 21   LMP 08/28/2008   SpO2 100%   Physical Exam Vitals and nursing note reviewed.  Constitutional:  General: She is not in acute distress.    Appearance: She is well-developed. She is not ill-appearing.  HENT:     Head: Normocephalic and atraumatic.     Nose: Nose normal.     Mouth/Throat:     Mouth: Mucous membranes are moist.  Eyes:     Extraocular Movements: Extraocular movements intact.     Conjunctiva/sclera: Conjunctivae normal.     Pupils: Pupils are equal, round, and reactive to light.  Cardiovascular:     Rate and Rhythm: Normal rate and regular rhythm.     Pulses: Normal pulses.     Heart sounds: Normal heart sounds. No murmur.  Pulmonary:     Effort: Pulmonary effort is normal. No respiratory distress.     Breath sounds: Normal breath sounds.  Abdominal:     General: There is no distension.     Palpations: Abdomen is soft. There is no mass.     Tenderness: There is no abdominal tenderness. There is no guarding or rebound.     Hernia: No hernia is present.  Musculoskeletal:        General: No swelling or tenderness. Normal range of motion.     Cervical back: Normal range of motion and neck supple.  Skin:    General: Skin is warm and dry.     Capillary Refill: Capillary refill takes less than 2  seconds.  Neurological:     General: No focal deficit present.     Mental Status: She is alert.  Psychiatric:        Mood and Affect: Mood normal.     ED Results / Procedures / Treatments   Labs (all labs ordered are listed, but only abnormal results are displayed) Labs Reviewed  CBC WITH DIFFERENTIAL/PLATELET - Abnormal; Notable for the following components:      Result Value   RBC 3.34 (*)    Hemoglobin 10.4 (*)    HCT 34.3 (*)    MCV 102.7 (*)    All other components within normal limits  BASIC METABOLIC PANEL - Abnormal; Notable for the following components:   Potassium 3.3 (*)    Glucose, Bld 108 (*)    Creatinine, Ser 1.35 (*)    Calcium 7.8 (*)    GFR calc non Af Amer 43 (*)    GFR calc Af Amer 50 (*)    All other components within normal limits  HEPATIC FUNCTION PANEL - Abnormal; Notable for the following components:   Total Protein 5.8 (*)    Albumin 1.6 (*)    All other components within normal limits  LIPASE, BLOOD  URINALYSIS, ROUTINE W REFLEX MICROSCOPIC    EKG EKG Interpretation  Date/Time:  Monday Jan 05 2020 13:09:00 EDT Ventricular Rate:  64 PR Interval:    QRS Duration: 100 QT Interval:  412 QTC Calculation: 426 R Axis:   67 Text Interpretation: Junctional rhythm Low voltage, precordial leads Confirmed by Lennice Sites [phone]) on 01/05/2020 2:37:52 PM   Radiology No results found.  Procedures Procedures (including critical care time)  Medications Ordered in ED Medications  sodium chloride 0.9 % bolus 1,000 mL (1,000 mLs Intravenous New Bag/Given 01/05/20 1218)  diphenhydrAMINE (BENADRYL) injection 25 mg (25 mg Intravenous Given 01/05/20 1218)  metoCLOPramide (REGLAN) injection 5 mg (5 mg Intravenous Given 01/05/20 1218)  potassium chloride 10 mEq in 100 mL IVPB (0 mEq Intravenous Stopped 01/05/20 1426)  ondansetron (ZOFRAN) injection 4 mg (4 mg Intravenous Given 01/05/20 1453)    ED Course  I have reviewed the triage vital signs and the  nursing notes.  Pertinent labs & imaging results that were available during my care of the patient were reviewed by me and considered in my medical decision making (see chart for details).    MDM Rules/Calculators/A&P                      Marissa Wilkerson is a 59 year old female with history of hypertension, diabetes, gastroparesis who presents to the ED with nausea and vomiting.  Family concern for dehydration.  History of the same.  Is on Reglan and Phenergan as needed for her gastroparesis.  However, she denies any abdominal pain.  She has chronic weakness and is wheelchair-bound.  Patient states that she had a fall yesterday morning but did not hit her head or lose consciousness.  Does not have any bony pain.  She just feels very rundown and dehydrated.  Overall she actually appears well.  We will get basic labs to evaluate for any kidney injury or electrolyte abnormality.  Will give IV fluids, IV Reglan and IV Benadryl and reevaluate.  Lab work with no significant anemia, electrolyte abnormality. Creatinine overall at baseline. No significant leukocytosis. Still awaiting urinalysis. Patient feeling slightly better after IV Reglan and Benadryl. Will give IV Zofran. Fluid bolus still running. Overall patient has chronically had some deconditioning over the past year. Husband is unable to fully take care of her at home. Will place order to case management for home health assuming patient will likely be okay for discharge to home. Please see oncoming ED doctor's note for further results, evaluation and disposition of the patient.  This chart was dictated using voice recognition software.  Despite best efforts to proofread,  errors can occur which can change the documentation meaning.     Final Clinical Impression(s) / ED Diagnoses Final diagnoses:  Vomiting without nausea, intractability of vomiting not specified, unspecified vomiting type  Weakness    Rx / DC Orders ED Discharge Orders    None        Lennice Sites, DO 01/05/20 1519

## 2020-01-05 NOTE — ED Notes (Signed)
ED Provider at bedside. 

## 2020-01-05 NOTE — H&P (Signed)
History and Physical    Marissa Wilkerson RCB:[id number] DOB: [birth year] DOA: 01/05/2020  PCP: Jani Gravel, MD  Patient coming from: Home  I have personally briefly reviewed patient's old medical records in [place]  Chief Complaint: weakness   HPI: Marissa Wilkerson is a 59 y.o. 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  Patient reports that she has chronic nausea and vomiting due to her gastroparesis but this has gotten worse in the past several days.  She has had decreased p.o. intake and has noticed decreased urinary retention.  Feels like she could go 24 hours without much urination.  Denies any dysuria or increased urgency or frequency. She also notes increasing weakness yesterday when her husband was trying to transfer her to the commode but  her legs became weak and she fell on her buttocks.  Did not hit her head or lose any consciousness.  She already has some chronic back pain and feels like after this fall it is worse. At baseline, patient  has chronic weakness and can only transfer from bed to the commode.  She is otherwise wheelchair-bound. Husband feels like he is having a harder time caring for her at home alone and has already met with case manager in the ED to help set up home health.  She denies any fevers.  No abdominal pain.  No shortness of breath.  No chest pain but she has felt palpitations and says she has had PVCs in the past.  Patient denies tobacco, alcohol or illicit drug use.   ED Course:  She was afebrile and hypertensive up to 180/70. No leukocytosis. Anemia at baseline with hemoglobin of 10.4.  Na of 143, K of 3.3, glucose of 108, creatinine of 1.35 which may be her baseline.  UA shows trace  leukocytes, negative nitrate with many bacteria and >50 WBC.   She received 1L NS, potassium, 68m Zofran, 562mReglan, 2570menadryl and started on Rocephin.   Review of Systems: Constitutional: No Weight Change, No Fever ENT/Mouth: No sore throat, No Rhinorrhea Eyes: No Eye Pain, No Vision Changes Cardiovascular: No Chest Pain, no SOB Respiratory: No Cough, No Sputum Gastrointestinal: + Nausea, + Vomiting, No Diarrhea, No Constipation, No Pain Genitourinary: no Urinary Incontinence, No Urgency, No Flank Pain Musculoskeletal: No Arthralgias, No Myalgias Skin: No Skin Lesions, No Pruritus, Neuro: +Weakness, No Numbness Psych: No Anxiety/Panic, No Depression,+decrease appetite Heme/Lymph: No Bruising, No Bleeding Past Medical History:  Diagnosis Date  . Anemia    05/04/15 hemglobin 8.8  . Arthritis   . Diabetes mellitus without complication (HCC)    insulin dependent, po meds  . Diastolic dysfunction    followed by dr polite found on echo 06-12-13 , grade 1 per pt  . Dyslipidemia   . Edema of extremities    PITTING EDEMA     . GERD (gastroesophageal reflux disease)   . Hypertension   . [facility]  [facility] Dr.Byrum, 9l16   . Shortness of breath    ON EXERTION , O2 2-3 l/min  . Toxoplasmosis Treated at age [age] 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  . Vertigo, benign positional   . Wheezing    OCC    Past Surgical History:  Procedure Laterality Date  .  all teeth extraction  7-8 yrs ago  . CHOLECYSTECTOMY  1996  . COLONOSCOPY WITH PROPOFOL N/A 08/05/2013   Procedure: COLONOSCOPY WITH PROPOFOL;  Surgeon: Garlan Fair, MD;  [facility];  Service: Endoscopy;  Laterality: N/A;  . ESOPHAGOGASTRODUODENOSCOPY (EGD) WITH PROPOFOL N/A 05/31/2015   Procedure: ESOPHAGOGASTRODUODENOSCOPY (EGD) WITH PROPOFOL;  Surgeon: Garlan Fair, MD;  [facility];  Service: Endoscopy;  Laterality: N/A;  . LUNG BIOPSY Left 03/05/2014   Procedure: LUNG  BIOPSY;  Surgeon: Gaye Pollack, MD;  Location: [facility];  Service: Thoracic;  Laterality: Left;  [name] VIDEO ASSISTED THORACOSCOPY Left 03/05/2014   Procedure: VIDEO ASSISTED THORACOSCOPY;  Surgeon: Gaye Pollack, MD;  Location: [facility];  Service: Thoracic;  Laterality: Left;  [name] VIDEO BRONCHOSCOPY Bilateral 09/04/2013   Procedure: VIDEO BRONCHOSCOPY WITH FLUORO;  Surgeon: Collene Gobble, MD;  [facility];  Service: Cardiopulmonary;  Laterality: Bilateral;  . WISDOM TOOTH EXTRACTION  yrs ago     reports that she quit smoking about 7 years ago. Her smoking use included cigarettes. She has a 12.50 pack-year smoking history. She has never used smokeless tobacco. She reports current alcohol use. She reports that she does not use drugs.  Allergies  Allergen Reactions  . Cymbalta [Duloxetine Hcl] Other (See Comments)    Hallucinations   . Fentanyl Other (See Comments)    Patches. Blistered skin.   . Metformin And Related Nausea And Vomiting  . Mobic [Meloxicam] Nausea And Vomiting  . Sulfa Antibiotics Rash  . Voltaren [Diclofenac] Nausea And Vomiting    Family History  Problem Relation Age of Onset  . Heart failure Mother   . Diabetes Father   . Hypertension Father   . Diabetes Brother   . Hypertension Brother      Prior to Admission medications   Medication Sig Start Date End Date Taking? Authorizing Provider  albuterol (PROVENTIL HFA;VENTOLIN HFA) 108 (90 Base) MCG/ACT inhaler Inhale 2 puffs into the lungs every 6 (six) hours as needed for wheezing or shortness of breath. 11/02/17  Yes Collene Gobble, MD  atorvastatin (LIPITOR) 20 MG tablet Take 1 tablet (20 mg total) by mouth daily. 09/28/17  Yes Georgette Shell, MD  carvedilol (COREG) 6.25 MG tablet Take 3.125 mg by mouth 2 (two) times daily with a meal.    Yes [provider]  donepezil (ARICEPT) 10 MG tablet Take 10 mg by mouth daily. 11/24/19  Yes [provider]  FLUoxetine (PROZAC) 10 MG capsule Take 10 mg  by mouth daily. 12/16/19  Yes [provider]  fluticasone (FLONASE) 50 MCG/ACT nasal spray Place 1 spray into both nostrils daily. Patient taking differently: Place 1 spray into both nostrils daily as needed for allergies.  09/29/17  Yes Georgette Shell, MD  HYDROcodone-acetaminophen [facility]) 10-325 MG tablet Take 0.5-1 tablets by mouth every 6 (six) hours as needed for moderate pain or severe pain.    Yes [provider]  Insulin Glargine (TOUJEO SOLOSTAR) 300 UNIT/ML SOPN Inject 25 Units into the skin 2 (two) times daily. 09/28/17  Yes Georgette Shell, MD  insulin lispro (HUMALOG) 100 UNIT/ML injection Inject 15-20 Units into the skin 2 (two) times daily.   Yes [provider]  lacosamide (VIMPAT) 200 MG TABS tablet Take 1 tablet by mouth 2 (two) times daily. 02/08/18  Yes [provider]  losartan (COZAAR) 50 MG tablet Take 50 mg by mouth daily.   Yes [provider]  metoCLOPramide (REGLAN)  5 MG tablet Take 5-10 mg by mouth in the morning and at bedtime. Take 2 tablets (10 mg) in the morning and Take 1 tablet (5 mg) in the evening   Yes [provider]  metolazone (ZAROXOLYN) 10 MG tablet Take 10 mg by mouth daily as needed (fluid).    Yes [provider]  promethazine (PHENERGAN) 25 MG tablet Take 25 mg by mouth every 6 (six) hours as needed for nausea or vomiting.  12/04/19  Yes [provider]  tiZANidine (ZANAFLEX) 4 MG tablet Take 1 tablet (4 mg total) by mouth every 8 (eight) hours as needed for muscle spasms. Patient taking differently: Take 2-4 mg by mouth every 8 (eight) hours as needed for muscle spasms.  09/28/17  Yes Georgette Shell, MD  torsemide (DEMADEX) 100 MG tablet Take 100 mg by mouth daily as needed (fluid).    Yes [provider]    Physical Exam: Vitals:   01/05/20 1400 01/05/20 1454 01/05/20 1600 01/05/20 1800  BP: (!) 180/70 (!) 185/69 (!) 163/65 (!) 181/70  Pulse: 66 69 64 66  Resp:  17 (!) 21 (!) 24 16  Temp:      TempSrc:      SpO2: 100% 100% 100% 100%    Constitutional: NAD, calm, comfortable, chronically ill appearing female laying flat in bed Vitals:   01/05/20 1400 01/05/20 1454 01/05/20 1600 01/05/20 1800  BP: (!) 180/70 (!) 185/69 (!) 163/65 (!) 181/70  Pulse: 66 69 64 66  Resp: 17 (!) 21 (!) 24 16  Temp:      TempSrc:      SpO2: 100% 100% 100% 100%   Eyes: PERRL, lids and conjunctivae normal ENMT: Mucous membranes are moist.  Neck: normal, supple Respiratory: Decreased aeration throughout, no wheezing, no crackles. Normal respiratory effort on 4 L via nasal cannula. No accessory muscle use.  Cardiovascular: Regular rate and rhythm, no murmurs / rubs / gallops.  +3 pitting edema of the left lower extremity distal to the knee. 2+ pedal pulses. No carotid bruits.  Abdomen: no tenderness, no masses palpated. Bowel sounds positive.  Musculoskeletal: no clubbing / cyanosis. No joint deformity upper and lower extremities. Good ROM, no contractures. Normal muscle tone.  Mild pain to palpation of the right ischium but no overlying hematoma or ecchymosis with good ROM of right LE. Skin: no rashes, lesions, ulcers. No induration Neurologic: CN 2-12 grossly intact. Sensation intact,Strength 4/5 in the left extremity.   Psychiatric: Normal judgment and insight. Alert and oriented x 3. Normal mood.     Labs on Admission: I have personally reviewed following labs and imaging studies  CBC: Recent Labs  Lab 01/05/20 1207  WBC 8.0  NEUTROABS 6.2  HGB 10.4*  HCT 34.3*  MCV 102.7*  PLT 176   Basic Metabolic Panel: Recent Labs  Lab 01/05/20 1207  NA 143  K 3.3*  CL 108  CO2 26  GLUCOSE 108*  BUN 16  CREATININE 1.35*  CALCIUM 7.8*   GFR: CrCl cannot be calculated (Unknown ideal weight.). Liver Function Tests: Recent Labs  Lab 01/05/20 1207  AST 32  ALT 18  ALKPHOS 77  BILITOT 0.5  PROT 5.8*  ALBUMIN 1.6*   Recent Labs  Lab 01/05/20 1207    LIPASE 15   No results for input(s): AMMONIA in the last 168 hours. Coagulation Profile: No results for input(s): INR, PROTIME in the last 168 hours. Cardiac Enzymes: No results for input(s): CKTOTAL, CKMB, CKMBINDEX, TROPONINI in  the last 168 hours. BNP (last 3 results) No results for input(s): PROBNP in the last 8760 hours. HbA1C: No results for input(s): HGBA1C in the last 72 hours. CBG: No results for input(s): GLUCAP in the last 168 hours. Lipid Profile: No results for input(s): CHOL, HDL, LDLCALC, TRIG, CHOLHDL, LDLDIRECT in the last 72 hours. Thyroid Function Tests: No results for input(s): TSH, T4TOTAL, FREET4, T3FREE, THYROIDAB in the last 72 hours. Anemia Panel: No results for input(s): VITAMINB12, FOLATE, FERRITIN, TIBC, IRON, RETICCTPCT in the last 72 hours. Urine analysis:    Component Value Date/Time   COLORURINE YELLOW 01/05/2020 1540   APPEARANCEUR CLOUDY (A) 01/05/2020 1540   LABSPEC 1.020 01/05/2020 1540   PHURINE 5.0 01/05/2020 1540   GLUCOSEU 50 (A) 01/05/2020 1540   HGBUR SMALL (A) 01/05/2020 1540   BILIRUBINUR NEGATIVE 01/05/2020 1540   KETONESUR 5 (A) 01/05/2020 1540   PROTEINUR >=300 (A) 01/05/2020 1540   UROBILINOGEN 1.0 03/03/2014 0942   NITRITE NEGATIVE 01/05/2020 1540   LEUKOCYTESUR TRACE (A) 01/05/2020 1540    Radiological Exams on Admission: No results found.    Assessment/Plan  Worsening Weakness likely secondary to dehydration/UTI Received 1 L normal saline fluid in the ED Continue IV Rocephin PT/OT  Nausea vomiting symptoms likely due to exacerbation of diabetic gastroparesis Continue daily Reglan  Hypertension Continue losartan, Coreg  Diastolic CHF Patient has chronic left lower extremity edema but otherwise does not appear overtly fluid overloaded will continue gentle IV fluids overnight   Pulmonary sarcoidosis with chronic hypoxemia At baseline 4 L  History of seizure Continue Vimpat  Insulin-dependent type 2  diabetes Continue moderate sliding scale for now as patient has had decreased p.o. intake Continue statin  Dementia Continue donepezil, Prozac  Status is: Inpatient  DVT prophylaxis:.Lovenox Code Status: DNR Family Communication: Plan discussed with patient and husband at bedside  disposition Plan: Home with at least 2 midnight stays  Consults called:  Admission status: inpatient  Remains inpatient appropriate because:IV treatments appropriate due to intensity of illness or inability to take PO   Dispo: The patient is from: Home              Anticipated d/c is to: Home              Anticipated d/c date is: 2 days              Patient currently is not medically stable to d/c.          Orene Desanctis DO [facility]   If 7PM-7AM, please contact night-coverage [email or web address]   01/05/2020, 7:29 PM

## 2020-01-05 NOTE — TOC Initial Note (Signed)
Transition of Care [facility]) - Initial/Assessment Note    Patient Details  Name: DELANY STEURY MRN: [id number] Date of Birth: Jan 18, 1961  Transition of Care [facility]) CM/SW Contact:    Erenest Rasher, RN Phone Number: [phone] 01/05/2020, 5:51 PM  Clinical Narrative:                 TOC CM spoke to pt and husband at bedside. Offered choice for [facility]. Pt states she used [facility] in the past. She is requesting hospital bed, hoyer lift and sliding board for home to make it easier on husband to transfer her to bedside commode. She has oxygen with [employer]. Will fax referral to [facility] and order DME with [place].     Barriers to Discharge: Continued Medical Work up   Patient Goals and CMS Choice Patient states their goals for this hospitalization and ongoing recovery are:: need more assistance at home CMS Medicare.gov Compare Post Acute Care list provided to:: Patient Choice offered to / list presented to : Patient  Expected Discharge Plan and Services   In-house Referral: Clinical Social Work Discharge Planning Services: CM Consult Post Acute Care Choice: Home Health Living arrangements for the past 2 months: [place]: [place]        Prior Living Arrangements/Services Living arrangements for the past 2 months: Single Family Home Lives with:: Spouse   Do you feel safe going back to the place where you live?: Yes      Need for Family Participation in Patient Care: Yes (Comment) Care giver support system in place?: Yes (comment) Current home services: DME(wheelchair x 2, rolling walker, bedside commode, oxygen ([employer])) Criminal Activity/Legal Involvement Pertinent to Current Situation/Hospitalization: No - Comment as needed  Activities of Daily Living      Permission Sought/Granted Permission sought to share information with : Case Manager, PCP, Family Supports    Share Information  with NAME: Aariyana Manz  Permission granted to share info w AGENCY: [place], [place] granted to share info w Relationship: husband  Permission granted to share info w Contact Information: [phone]  Emotional Assessment Appearance:: Appears stated age Attitude/Demeanor/Rapport: Gracious Affect (typically observed): Accepting Orientation: : Oriented to Self, Oriented to Place, Oriented to  Time, Oriented to Situation   Psych Involvement: No (comment)  Admission diagnosis:  nausea;emesis x5 days fall last night Patient Active Problem List   Diagnosis Date Noted  . Snoring 04/12/2018  . Chronic rhinitis 04/12/2018  . Nausea and vomiting 10/17/2017  . AKI (acute kidney injury) ([place]) 10/16/2017  . CHF (congestive heart failure), NYHA class I, unspecified failure chronicity, diastolic ([place]) 50/27/7412  . Hyperkalemia 09/15/2017  . CHF (congestive heart failure) ([place]) 09/15/2017  . Hyperlipidemia 08/08/2016  . Bilateral carotid artery disease ([place]) 08/08/2016  . Lower extremity weakness 02/22/2016  . Multiple lung nodules 03/05/2014  . Obstructive lung disease ([place]) 02/09/2014  . Chronic respiratory failure ([place]) 09/02/2013  . Sarcoidosis of lung ([place]) 09/02/2013  . Hypertension   . Diabetes mellitus without complication ([place])   . Diastolic dysfunction    PCP:  Jani Gravel, MD Pharmacy:   [place], [place] - [place] [address] [place] [address] Phone: [phone] Fax: [phone]     Social  Determinants of Health (SDOH) Interventions    Readmission Risk Interventions No flowsheet data found.

## 2020-01-05 NOTE — Progress Notes (Signed)
.      Durable Medical Equipment  (From admission, onward)         Start     Ordered   01/05/20 1746  For home use only DME Other see comment  Once    Comments: Sliding board Height 5'3" Weight 113.9 kg  Question:  Length of Need  Answer:  Lifetime   01/05/20 1746   01/05/20 1744  For home use only DME Overbed table  Once     01/05/20 1746   01/05/20 1743  For home use only DME Hospital bed  Once    Question Answer Comment  Length of Need Lifetime   Patient has (list medical condition): chronic weaknes, CHF, Sarcoidosis of lung, wheelchair bound   The above medical condition requires: Patient requires the ability to reposition frequently   Head must be elevated greater than: 45 degrees   Bed type Semi-electric   [employer] Yes   Trapeze Bar Yes   Support Surface: Gel Overlay      01/05/20 1746

## 2020-01-05 NOTE — ED Provider Notes (Signed)
5:41 PM Care assumed from Dr. Ronnald Nian.  At time of transfer of care, patient is awaiting results of urinalysis prior to determining disposition.  Patient reportedly had decreased oral intake with nausea vomiting and decreased urine.  Patient is generally fatigued and cannot ambulate.  Plan of care was to assess for UTI.  If urine was discovered, will treat with IV antibiotics and admit for rehydration and further monitoring with inability for oral intake.  Urinalysis has returned showing leukocytes, greater than 50 whites, and bacteria.  No epithelial cells to suggest contamination.  Also significant Beneprotein present.  She has some ketones which may suggest dehydration.  No nitrites.  Culture will be sent.  Went and had a shared conversation with patient and family.  They say that she is having the urinary hesitancy and decreased urine and are concerned about recurrent UTI.  They do not feel comfortable going home at this time with her having a likely UTI and inability to take oral medications or maintain hydration at home.  Per previous plan of care, will give IV antibiotics and call for admission for further monitoring of the rehydration, nausea, vomiting, and UTI.  We also had the case management team see the patient and had patient been discharged, she would likely need an assortment of home health services.  Those orders were placed by the previous team however, she will likely now need inpatient PT/OT to evaluate to see her needs and to see if she will need a rehabilitation stay.  We will call for admission for UTI and severe fatigue with decreased oral intake.   Marissa Wilkerson, Marissa Allegra, MD 01/05/20 [phone]

## 2020-01-05 NOTE — ED Triage Notes (Signed)
Transported by [facility] from home-- patient reports n/v x 5 days and reports that she experienced a fall last night injuring her lower back. Patient has a history of dementia however is oriented x 3 at baseline. VSS with [facility] and CBG read 115 mg/dl.

## 2020-01-06 ENCOUNTER — Inpatient Hospital Stay (HOSPITAL_COMMUNITY): Payer: Medicare Other

## 2020-01-06 ENCOUNTER — Encounter (HOSPITAL_COMMUNITY): Payer: Medicare Other

## 2020-01-06 DIAGNOSIS — R609 Edema, unspecified: Secondary | ICD-10-CM

## 2020-01-06 DIAGNOSIS — Z794 Long term (current) use of insulin: Secondary | ICD-10-CM

## 2020-01-06 DIAGNOSIS — M7989 Other specified soft tissue disorders: Secondary | ICD-10-CM

## 2020-01-06 DIAGNOSIS — E1169 Type 2 diabetes mellitus with other specified complication: Secondary | ICD-10-CM

## 2020-01-06 LAB — CBC
HCT: 30.7 % — ABNORMAL LOW (ref 36.0–46.0)
Hemoglobin: 9.4 g/dL — ABNORMAL LOW (ref 12.0–15.0)
MCH: 31.1 pg (ref 26.0–34.0)
MCHC: 30.6 g/dL (ref 30.0–36.0)
MCV: 101.7 fL — ABNORMAL HIGH (ref 80.0–100.0)
Platelets: 151 10*3/uL (ref 150–400)
RBC: 3.02 MIL/uL — ABNORMAL LOW (ref 3.87–5.11)
RDW: 13.4 % (ref 11.5–15.5)
WBC: 8 10*3/uL (ref 4.0–10.5)
nRBC: 0 % (ref 0.0–0.2)

## 2020-01-06 LAB — BASIC METABOLIC PANEL
Anion gap: 7 (ref 5–15)
BUN: 13 mg/dL (ref 6–20)
CO2: 27 mmol/L (ref 22–32)
Calcium: 7.3 mg/dL — ABNORMAL LOW (ref 8.9–10.3)
Chloride: 108 mmol/L (ref 98–111)
Creatinine, Ser: 1.24 mg/dL — ABNORMAL HIGH (ref 0.44–1.00)
GFR calc Af Amer: 56 mL/min — ABNORMAL LOW (ref >60)
GFR calc non Af Amer: 48 mL/min — ABNORMAL LOW (ref >60)
Glucose, Bld: 80 mg/dL (ref 70–99)
Potassium: 3.1 mmol/L — ABNORMAL LOW (ref 3.5–5.1)
Sodium: 142 mmol/L (ref 135–145)

## 2020-01-06 LAB — GLUCOSE, CAPILLARY
Glucose-Capillary: 78 mg/dL (ref 70–99)
Glucose-Capillary: 91 mg/dL (ref 70–99)
Glucose-Capillary: 109 mg/dL — ABNORMAL HIGH (ref 70–99)
Glucose-Capillary: 115 mg/dL — ABNORMAL HIGH (ref 70–99)

## 2020-01-06 MED ORDER — HYDRALAZINE HCL 20 MG/ML IJ SOLN
10.0000 mg | Freq: Once | INTRAMUSCULAR | Status: AC
  Administered 2020-01-06: 10 mg via INTRAVENOUS
  Filled 2020-01-06: qty 1

## 2020-01-06 MED ORDER — POTASSIUM CHLORIDE 20 MEQ PO PACK
40.0000 meq | PACK | Freq: Once | ORAL | Status: AC
  Administered 2020-01-06: 40 meq via ORAL
  Filled 2020-01-06: qty 2

## 2020-01-06 MED ORDER — AMLODIPINE BESYLATE 10 MG PO TABS
10.0000 mg | ORAL_TABLET | Freq: Every day | ORAL | Status: DC
  Administered 2020-01-06 – 2020-01-08 (×3): 10 mg via ORAL
  Filled 2020-01-06 (×3): qty 1

## 2020-01-06 MED ORDER — HYDRALAZINE HCL 20 MG/ML IJ SOLN
10.0000 mg | Freq: Four times a day (QID) | INTRAMUSCULAR | Status: DC | PRN
  Administered 2020-01-07: 10 mg via INTRAVENOUS
  Filled 2020-01-06: qty 1

## 2020-01-06 MED ORDER — ACETAMINOPHEN 325 MG PO TABS
650.0000 mg | ORAL_TABLET | Freq: Four times a day (QID) | ORAL | Status: DC | PRN
  Administered 2020-01-08: 650 mg via ORAL
  Filled 2020-01-06 (×2): qty 2

## 2020-01-06 NOTE — Evaluation (Signed)
Physical Therapy Evaluation Patient Details Name: Marissa Wilkerson MRN: [id number] DOB: [birth year] Today's Date: 01/06/2020   History of Present Illness  59 yo female admitted with weakness, UTI, fall at home. Hx of CHF, sarcoidosis-O2 dep, DM, BPPV, obesity  Clinical Impression  On eval, pt required Min assist +2 for safety/equipment for mobility. She was able to perform stand pivot, bed<>bsc, using RW. Pt fatigues easily and legs give out. Pt presents with general weakness, decreased activity tolerance, and impaired gait and balance. Will plan to follow and progress activity as tolerated.     Follow Up Recommendations Home health PT;Supervision/Assistance - 24 hour;HHOT;Home Health Aide    Equipment Recommendations  None recommended by PT    Recommendations for Other Services       Precautions / Restrictions Precautions Precautions: Fall Precaution Comments: O2 dep @ baseline Restrictions Weight Bearing Restrictions: No      Mobility  Bed Mobility Overal bed mobility: Needs Assistance Bed Mobility: Supine to Sit     Supine to sit: Min assist;HOB elevated     General bed mobility comments: Assist for LEs and to get to EOB. Increased time.  Transfers Overall transfer level: Needs assistance Equipment used: Rolling walker (2 wheeled) Transfers: Sit to/from [facility] Sit to Stand: Min assist;+2 safety/equipment Stand pivot transfers: Min assist;+2 safety/equipment       General transfer comment: Assist to power up, stabilize, control descent. VCs safety, hand placement. Stand pivot, bed<>, bsc, using RW, Pt fatigues easily. She is fearful of falling  Ambulation/Gait             General Gait Details: NT  Stairs            Wheelchair Mobility    Modified Rankin (Stroke Patients Only)       Balance Overall balance assessment: Needs assistance;History of Falls         Standing balance support: Bilateral upper extremity  supported Standing balance-Leahy Scale: Poor                               Pertinent Vitals/Pain Pain Assessment: Faces Faces Pain Scale: Hurts even more Pain Location: generalized pain Pain Descriptors / Indicators: Discomfort;Sore Pain Intervention(s): Limited activity within patient's tolerance;Monitored during session;Repositioned    Home Living Family/patient expects to be discharged to:: Private residence Living Arrangements: Spouse/significant other Available Help at Discharge: Family Type of Home: House         Home Equipment: Gilford Rile - 2 wheels;Wheelchair - [employer];Tub bench      Prior Function Level of Independence: Needs assistance   Gait / Transfers Assistance Needed: mostly transfers.  ADL's / Homemaking Assistance Needed: husband assist with ADLs        Hand Dominance        Extremity/Trunk Assessment   Upper Extremity Assessment Upper Extremity Assessment: Generalized weakness    Lower Extremity Assessment Lower Extremity Assessment: Generalized weakness    Cervical / Trunk Assessment Cervical / Trunk Assessment: Kyphotic  Communication   Communication: No difficulties  Cognition Arousal/Alertness: Awake/alert Behavior During Therapy: WFL for tasks assessed/performed Overall Cognitive Status: Within Functional Limits for tasks assessed                                        General Comments      Exercises  Assessment/Plan    PT Assessment Patient needs continued PT services  PT Problem List Decreased strength;Decreased mobility;Decreased activity tolerance;Decreased balance;Decreased knowledge of use of DME       PT Treatment Interventions DME instruction;Gait training;Therapeutic exercise;Therapeutic activities;Patient/family education;Balance training;Functional mobility training    PT Goals (Current goals can be found in the Care Plan section)  Acute Rehab PT Goals Patient Stated  Goal: home PT Goal Formulation: With patient/family Time For Goal Achievement: 01/20/20 Potential to Achieve Goals: Fair    Frequency Min 3X/week   Barriers to discharge        Co-evaluation               AM-PAC PT "6 Clicks" Mobility  Outcome Measure Help needed turning from your back to your side while in a flat bed without using bedrails?: A Little Help needed moving from lying on your back to sitting on the side of a flat bed without using bedrails?: A Little Help needed moving to and from a bed to a chair (including a wheelchair)?: A Little Help needed standing up from a chair using your arms (e.g., wheelchair or bedside chair)?: A Little Help needed to walk in hospital room?: A Lot Help needed climbing 3-5 steps with a railing? : Total 6 Click Score: 15    End of Session Equipment Utilized During Treatment: Gait belt;Oxygen Activity Tolerance: Patient limited by fatigue Patient left: in bed;with call bell/phone within reach;with bed alarm set;with family/visitor present   PT Visit Diagnosis: Muscle weakness (generalized) (M62.81);History of falling (Z91.81);Difficulty in walking, not elsewhere classified (R26.2)    Time: 6389-3734 PT Time Calculation (min) (ACUTE ONLY): 22 min   Charges:   PT Evaluation $PT Eval Moderate Complexity: 1 Mod            Loney Domingo P, PT Acute Rehabilitation

## 2020-01-06 NOTE — Progress Notes (Signed)
PROGRESS NOTE    Marissa Wilkerson  DVV:[id number]  DOB: December 22, 1960  PCP: Jani Gravel, MD Admit date:01/05/2020 59 y.o. 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 She does have gastroparesis/chronic nausea at baseline but lately gotten worse.At baseline, patient is wheelchair-bound- can only transfer from bed to the commode. On when her husband was trying to transfer her to the commode but  her legs became weak and she fell on her buttocks.  Did not hit her head or loose consciousness.  She has chronic back pain and feels like after this fall it is worse. ED Course: Afebrile,hypertensive up to 180/70.No leukocytosis. Anemia at baseline with hemoglobin of 10.4.  Na of 143, K of 3.3, glucose of 108, creatinine of 1.35 which may be her baseline.  UA shows negative nitrite, many bacteria and >50 WBC. She received 1L NS, potassium, 4mg  Zofran, 5mg  Reglan, 25mg  Benadryl and started on Rocephin.  Hospital course: Patient admitted to [facility] for further evaluation and management.   Subjective:  Patient feels improved today.  Husband at bedside and states patient has baseline dementia with memory deficits which has been somewhat worse over the last few weeks.  He reports reduced alertness and functional decline even more over the last couple of days.  Patient denies any dysuria.  Patient takes Toujeo 14 units twice daily for diabetes and according to patient/husband, she has fluctuating blood glucose levels due to variable oral intake/gastroparesis but lately her sugars have been more on the low side (70--110).  Although blood pressure been elevated in the hospital, according to the husband, patient is normotensive at home.  He denies noting any seizure activity and states  her prior seizure episodes have always been in the setting of infection and are generalized tonic-clonic in nature.  Objective: Vitals:   01/05/20 2042 01/05/20 2124 01/06/20 0142 01/06/20 0558  BP: (!) 167/62 (!) 181/77 (!) 190/65 (!) 199/77  Pulse: 70 72 79 73  Resp: (!) 22 18 18 18   Temp:  99.1 F (37.3 C) 98.6 F (37 C) 98.2 F (36.8 C)  TempSrc:  Oral Oral Oral  SpO2: 99% 100% 100% 98%  Weight: 108.9 kg     Height: 5\' 3"  (1.6 m)       Intake/Output Summary (Last 24 hours) at 01/06/2020 0805 Last data filed at 01/06/2020 0600 Gross per 24 hour  Intake 2050 ml  Output 400 ml  Net 1650 ml   Filed Weights   01/05/20 2042  Weight: 108.9 kg    Physical Examination:  General exam: Appears calm and comfortable  Respiratory system: Clear to auscultation. Respiratory effort normal. Cardiovascular system: S1 & S2 heard, RRR. No JVD, murmurs, rubs, gallops or clicks. No pedal edema. Gastrointestinal system: Abdomen is nondistended, soft and nontender. Normal bowel sounds heard. Central nervous system: Alert and oriented. No new focal neurological deficits. Extremities: Left lower extremity swelling (chronic per patient) compared to right, discoloration at the base of first and third toe (new per patient) noted.  Dorsalis pedis palpable bilaterally 2+. Psychiatry: Judgement and insight appear normal. Mood & affect appropriate.   Data Reviewed: I have personally reviewed following labs and imaging studies  CBC: Recent Labs  Lab 01/05/20 1207 01/06/20 0428  WBC 8.0 8.0  NEUTROABS 6.2  --   HGB 10.4* 9.4*  HCT 34.3* 30.7*  MCV 102.7* 101.7*  PLT 157 601   Basic Metabolic Panel: Recent Labs  Lab 01/05/20 1207 01/06/20 0428  NA 143 142  K 3.3* 3.1*  CL 108 108  CO2 26 27  GLUCOSE 108* 80  BUN 16 13  CREATININE 1.35* 1.24*  CALCIUM 7.8* 7.3*   GFR: Estimated Creatinine Clearance: 59.3 mL/min (A) (by C-G formula based on SCr of 1.24 mg/dL (H)). Liver Function  Tests: Recent Labs  Lab 01/05/20 1207  AST 32  ALT 18  ALKPHOS 77  BILITOT 0.5  PROT 5.8*  ALBUMIN 1.6*   Recent Labs  Lab 01/05/20 1207  LIPASE 15   No results for input(s): AMMONIA in the last 168 hours. Coagulation Profile: No results for input(s): INR, PROTIME in the last 168 hours. Cardiac Enzymes: No results for input(s): CKTOTAL, CKMB, CKMBINDEX, TROPONINI in the last 168 hours. BNP (last 3 results) No results for input(s): PROBNP in the last 8760 hours. HbA1C: Recent Labs    01/05/20 2015  HGBA1C 4.7*   CBG: Recent Labs  Lab 01/05/20 2241 01/05/20 2353 01/06/20 0751  GLUCAP 67* 73 78   Lipid Profile: No results for input(s): CHOL, HDL, LDLCALC, TRIG, CHOLHDL, LDLDIRECT in the last 72 hours. Thyroid Function Tests: No results for input(s): TSH, T4TOTAL, FREET4, T3FREE, THYROIDAB in the last 72 hours. Anemia Panel: No results for input(s): VITAMINB12, FOLATE, FERRITIN, TIBC, IRON, RETICCTPCT in the last 72 hours. Sepsis Labs: No results for input(s): PROCALCITON, LATICACIDVEN in the last 168 hours.  No results found for this or any previous visit (from the past 240 hour(s)).    Radiology Studies: No results found.      Scheduled Meds: . atorvastatin  20 mg Oral Daily  . carvedilol  3.125 mg Oral BID WC  . donepezil  10 mg Oral Daily  . enoxaparin (LOVENOX) injection  40 mg Subcutaneous Q24H  . FLUoxetine  10 mg Oral Daily  . insulin aspart  0-15 Units Subcutaneous TID WC  . lacosamide  200 mg Oral BID  . losartan  50 mg Oral Daily  . metoCLOPramide  10 mg Oral Daily   And  . metoCLOPramide  5 mg Oral QHS   Continuous Infusions: . sodium chloride 75 mL/hr at 01/05/20 2026  . cefTRIAXone (ROCEPHIN)  IV       Assessment/Plan:  Acute metabolic encephalopathy: secondary to hypoglycemia vs dehydration vs UTI vs uncontrolled BP-Patient has had functional/cognitive decline over the last few days with oliguria/n/v and reduced alertness. It  appears that her BG have been relatively low and repeat HgbA1C at 4.1!!. Received 1 L normal saline fluid in the ED Continue IV Rocephin. Follow urine cultures.  Control BP. CT head no acute abnormalities. No c/o dizziness/syncope.  On Vimpat for seizure h/o but no report of tonic clonic movements/sezure activity per se.  PT/OT  Acute Kidney injury: baseline creatinine appears to have been around 0.8-1.0 in yrs 2015- 2019. Had some renal dysfunction in 09/2017 with creat peak to 2.4 but later improved to 1.2. Creatinine on this presentation at 1.35 and improving now with fluids.  Hold ACEI.   Insulin-dependent type 2 diabetes-Continue sliding scale for now as patient has had decreased p.o. intake and it appears that she had been somewhat hypoglycemic at home. Apparently takes Trojeou 14 units BID. HgbAIC at 4.1. Insulin can likely be discontinued.Continue statin  Nausea/Vomiting: Sec to UTI vs Diabetic gastroparesis. Continue supportive management, on daily Reglan  Malignant Hypertension- Hold losartan  due to AKI. Add Norvasc, continue Coreg (can increase dose if HR tolerates) . CT head unremarkable, old stroke seen  Diastolic CHF-Patient has chronic left lower extremity edema but otherwise does not appear overtly fluid overloaded , started on gentle IV fluids overnight -will reduce rate and DC in a.m. 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  She states her left leg has always been more swollen than the right.  She however noted discoloration only since yesterday.  Will obtain venous Doppler to rule out DVT, can consider arterial Doppler if venous study okay (can palpate dorsalis pedis and denies any ischemic symptoms/pain).  History of seizure-Continue  Vimpat  Fall/chronic back pain: PT/OT eval. Fell on buttocks. LS x ray if symptoms worsen. Currently feels improved  Vascular Dementia-Continue donepezil, Prozac. Ct HEAD with old stroke and encephalomalacia.  Weakness/deconditioning : in the setting of chronic debility/back pain/dementia and now UTI/dehydration. Fell on her buttocks while being assisted to commode. No head injury.    DVT prophylaxis: Lovenox Code Status: DNR Family / Patient Communication: Discussed with patient and with husband at bedside Disposition Plan:   Status is: Inpatient  Remains inpatient appropriate because:Ongoing diagnostic testing needed not appropriate for outpatient work up.  Pending urine culture, venous Doppler, insulin adjustment, PT evaluation.  Remains on IV antibiotics/IV fluids for now   Dispo: The patient is from: Home              Anticipated d/c is to: Home with home health versus short-term SNF rehab              Anticipated d/c date is: 2 days              Patient currently is not medically stable to d/c.          LOS: 1 day    Time spent: 35 minutes    Guilford Shi, MD [facility] Pager in [place]  If 7PM-7AM, please contact night-coverage [email or web address] 01/06/2020, 8:05 AM

## 2020-01-06 NOTE — Progress Notes (Signed)
Lower extremity venous has been completed.   Preliminary results in CV Proc.   Abram Sander 01/06/2020 1:52 PM

## 2020-01-07 ENCOUNTER — Encounter (HOSPITAL_COMMUNITY): Payer: Medicare Other

## 2020-01-07 LAB — BASIC METABOLIC PANEL
Anion gap: 6 (ref 5–15)
BUN: 14 mg/dL (ref 6–20)
CO2: 26 mmol/L (ref 22–32)
Calcium: 7 mg/dL — ABNORMAL LOW (ref 8.9–10.3)
Chloride: 107 mmol/L (ref 98–111)
Creatinine, Ser: 1.24 mg/dL — ABNORMAL HIGH (ref 0.44–1.00)
GFR calc Af Amer: 56 mL/min — ABNORMAL LOW (ref >60)
GFR calc non Af Amer: 48 mL/min — ABNORMAL LOW (ref >60)
Glucose, Bld: 101 mg/dL — ABNORMAL HIGH (ref 70–99)
Potassium: 3.6 mmol/L (ref 3.5–5.1)
Sodium: 139 mmol/L (ref 135–145)

## 2020-01-07 LAB — GLUCOSE, CAPILLARY
Glucose-Capillary: 78 mg/dL (ref 70–99)
Glucose-Capillary: 101 mg/dL — ABNORMAL HIGH (ref 70–99)
Glucose-Capillary: 115 mg/dL — ABNORMAL HIGH (ref 70–99)
Glucose-Capillary: 98 mg/dL (ref 70–99)

## 2020-01-07 LAB — URINE CULTURE: Culture: >=100000 — AB

## 2020-01-07 LAB — HIV ANTIBODY (ROUTINE TESTING W REFLEX): HIV Screen 4th Generation wRfx: NONREACTIVE

## 2020-01-07 NOTE — Progress Notes (Signed)
PROGRESS NOTE  Marissa Wilkerson ACZ:[id number] DOB: [birth year] DOA: 01/05/2020 PCP: Jani Gravel, MD  Brief History   59 y.o.femalewith medical history significant forpulmonary sarcoidosis with chronic hypoxemia on 4 L via nasal cannula, OSA not on CPAP, chronic diastolic heart failure, hypertension, insulin-dependent type 2 diabetes, seizure and dementia presents with weakness, nausea / vomiting and oliguria. She does have gastroparesis/chronic nausea at baseline but lately gotten worse.At baseline, patient is wheelchair-bound- can only transferfrom bed to the commode. On when her husband was trying to transfer her to thecommode buther legs became weak and she fell on her buttocks. Did not hit her head or loose consciousness. She has chronic back pain and feels like after this fall it is worse. ED Course: Afebrile,hypertensive up to 180/70.No leukocytosis. Anemia at baseline with hemoglobin of 10.4.  Na of 143, K of 3.3, glucose of 108, creatinine of 1.35 which may be her baseline.  UA shows negative nitrite, many bacteria and >50 WBC. She received 1L NS, potassium, 4mg  Zofran, 5mg  Reglan, 25mg  Benadryl and started on Rocephin. Hospital course: Patient admitted to [facility] for further evaluation and management. She is receiving IV rocephin for E. Coli UTI.  Consultants  . None  Antibiotics   Anti-infectives (From admission, onward)   Start     Dose/Rate Route Frequency Ordered Stop   01/06/20 1000  cefTRIAXone (ROCEPHIN) 1 g in sodium chloride 0.9 % 100 mL IVPB     1 g 200 mL/hr over 30 Minutes Intravenous Every 24 hours 01/05/20 1926     01/05/20 1745  cefTRIAXone (ROCEPHIN) 1 g in sodium chloride 0.9 % 100 mL IVPB     1 g 200 mL/hr over 30 Minutes Intravenous  Once 01/05/20 1744 01/05/20 1934    .  Subjective  The patient complains that she is having abdominal cramping and that she has had 12 loose BM's since yesterday.  Objective   Vitals:  Vitals:   01/07/20 0657 01/07/20 1422   BP: (!) 178/67 (!) 185/72  Pulse: 77 77  Resp: 16 14  Temp: 98.1 F (36.7 C) 98.2 F (36.8 C)  SpO2: 100% 100%   Exam:  Constitutional:  . The patient is awake, alert, and oriented x 3. No acute distress. Eyes:  . pupils and irises appear normal . Normal lids and conjunctivae ENMT:  . grossly normal hearing  . Lips appear normal . external ears, nose appear normal . Oropharynx: mucosa, tongue,posterior pharynx appear normal Neck:  . neck appears normal, no masses, normal ROM, supple . no thyromegaly Respiratory:  . No increased work of breathing. . No wheezes, rales, or rhonchi . No tactile fremitus Cardiovascular:  . Regular rate and rhythm . No murmurs, ectopy, or gallups. . No lateral PMI. No thrills. Abdomen:  . Abdomen is soft, non-tender, non-distended . No hernias, masses, or organomegaly . Normoactive bowel sounds.  Musculoskeletal:  . No cyanosis, clubbing, or edema Skin:  . No rashes, lesions, ulcers . palpation of skin: no induration or nodules Neurologic:  . CN 2-12 intact . Sensation all 4 extremities intact Psychiatric:  . Mental status o Mood, affect appropriate o Orientation to person, place, time  . judgment and insight appear intact   I have personally reviewed the following:   Today's Data  . Vitals, BMP  Micro Data  . Urine Culture: Has grown out E. Coli  Scheduled Meds: . amLODipine  10 mg Oral Daily  . atorvastatin  20 mg Oral Daily  . carvedilol  3.125  mg Oral BID WC  . donepezil  10 mg Oral Daily  . enoxaparin (LOVENOX) injection  40 mg Subcutaneous Q24H  . FLUoxetine  10 mg Oral Daily  . insulin aspart  0-15 Units Subcutaneous TID WC  . lacosamide  200 mg Oral BID  . metoCLOPramide  10 mg Oral Daily   And  . metoCLOPramide  5 mg Oral QHS   Continuous Infusions: . cefTRIAXone (ROCEPHIN)  IV 1 g (01/07/20 0905)    Principal Problem:   Weakness Active Problems:   Chronic respiratory failure (HCC)   Diabetes  mellitus without complication (HCC)   Diastolic dysfunction   Sarcoidosis of lung ([place])   History of seizure   LOS: 2 days   A & P  Acute metabolic encephalopathy: Appears improved. Likely secondary to hypoglycemia vs dehydration vs UTI vs uncontrolled BP-Patient has had functional/cognitive decline over the last few days with oliguria/n/v and reduced alertness. It appears that her BG have been relatively low and repeat HgbA1C at 4.1!!. Received 1 L normal saline fluid in the ED Continue IV Rocephin. Follow urine cultures.  Control BP. CT head no acute abnormalities. No c/o dizziness/syncope.  On Vimpat for seizure h/o but no report of tonic clonic movements/sezure activity per se.  PT/OT to eval and treat.  UTI: Urine culture has grown out E. Coli. The patient is receiving IV Rocephin.  Acute Kidney injury: baseline creatinine appears to have been around 0.8-1.0 in yrs 2015- 2019. Had some renal dysfunction in 09/2017 with creat peak to 2.4 but later improved to 1.2. Creatinine on this presentation at 1.35 and improving now with fluids.  Hold ACEI.   Insulin-dependent type 2 diabetes: Continue sliding scale for now as patient has had decreased p.o. intake and it appears that she had been somewhat hypoglycemic at home. Apparently takes Trojeou 14 units BID. HgbAIC at 4.1. Insulin can likely be discontinued.Continue statin.  Diarrhea: Patient states that she is having abdominal cramping. She has had 12 loose stools since yesterday. I have ordered an enteric pathogen panel. Enteric precautions in place.  Nausea/Vomiting: Sec to UTI vs Diabetic gastroparesis. Continue supportive management, on daily Reglan  Malignant Hypertension:  Hold losartan due to AKI. Add Norvasc, continue Coreg (can increase dose if HR tolerates) . CT head unremarkable, old stroke seen  Diastolic CHF: Patient has chronic left lower extremity edema but otherwise does not appear overtly fluid overloaded , started on  gentle IV fluids overnight -will reduce rate and DC in a.m. as renal function improving  Pulmonary sarcoidosis with chronic hypoxic respiratory failure: At baseline 4 L.  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  She states her left leg has always been more swollen than the right.  She however noted discoloration only since yesterday.  Will obtain venous Doppler to rule out DVT, can consider arterial Doppler if venous study okay (can palpate dorsalis pedis and denies any ischemic symptoms/pain).  History of seizure: Continue Vimpat  Fall/chronic back pain: PT/OT eval. Fell on buttocks. LS x ray if symptoms worsen. Currently feels improved  Vascular Dementia: Continue donepezil, Prozac. Ct HEAD with old stroke and encephalomalacia.  Weakness/deconditioning : in the setting of chronic debility/back pain/dementia and now UTI/dehydration. Fell on her buttocks while being assisted to commode. No head injury.   I have seen and examined this patient myself. I have spent 34 minutes in her evaluation and  care.  DVT prophylaxis: Lovenox Code Status: DNR Family / Patient Communication: None available Disposition Plan:  The patient is from home. Anticipate discharge to home with 24 hour supervision and HH PT/OT and home health aide. Barriers to discharge: Diarrhea  Maham Quintin, DO [facility] Direct contact: see [email or web address]  7PM-7AM contact night coverage as above 01/07/2020, 3:19 PM  LOS: 2 days

## 2020-01-08 ENCOUNTER — Inpatient Hospital Stay (HOSPITAL_COMMUNITY): Payer: Medicare Other

## 2020-01-08 DIAGNOSIS — E876 Hypokalemia: Secondary | ICD-10-CM

## 2020-01-08 DIAGNOSIS — J9611 Chronic respiratory failure with hypoxia: Secondary | ICD-10-CM

## 2020-01-08 DIAGNOSIS — I5032 Chronic diastolic (congestive) heart failure: Secondary | ICD-10-CM

## 2020-01-08 LAB — BASIC METABOLIC PANEL
Anion gap: 4 — ABNORMAL LOW (ref 5–15)
BUN: 16 mg/dL (ref 6–20)
CO2: 26 mmol/L (ref 22–32)
Calcium: 7.2 mg/dL — ABNORMAL LOW (ref 8.9–10.3)
Chloride: 106 mmol/L (ref 98–111)
Creatinine, Ser: 1.2 mg/dL — ABNORMAL HIGH (ref 0.44–1.00)
GFR calc Af Amer: 58 mL/min — ABNORMAL LOW (ref >60)
GFR calc non Af Amer: 50 mL/min — ABNORMAL LOW (ref >60)
Glucose, Bld: 93 mg/dL (ref 70–99)
Potassium: 3.3 mmol/L — ABNORMAL LOW (ref 3.5–5.1)
Sodium: 136 mmol/L (ref 135–145)

## 2020-01-08 LAB — GLUCOSE, CAPILLARY
Glucose-Capillary: 93 mg/dL (ref 70–99)
Glucose-Capillary: 106 mg/dL — ABNORMAL HIGH (ref 70–99)
Glucose-Capillary: 113 mg/dL — ABNORMAL HIGH (ref 70–99)
Glucose-Capillary: 100 mg/dL — ABNORMAL HIGH (ref 70–99)

## 2020-01-08 MED ORDER — POTASSIUM CHLORIDE CRYS ER 20 MEQ PO TBCR
40.0000 meq | EXTENDED_RELEASE_TABLET | Freq: Once | ORAL | Status: AC
  Administered 2020-01-08: 40 meq via ORAL
  Filled 2020-01-08: qty 2

## 2020-01-08 MED ORDER — TORSEMIDE 20 MG PO TABS
40.0000 mg | ORAL_TABLET | Freq: Once | ORAL | Status: AC
  Administered 2020-01-08: 40 mg via ORAL
  Filled 2020-01-08: qty 2

## 2020-01-08 MED ORDER — CARVEDILOL 6.25 MG PO TABS
6.2500 mg | ORAL_TABLET | Freq: Two times a day (BID) | ORAL | Status: DC
  Administered 2020-01-09: 6.25 mg via ORAL
  Filled 2020-01-08: qty 1

## 2020-01-08 NOTE — Progress Notes (Signed)
PROGRESS NOTE    Marissa Wilkerson  JSE:[id number] DOB: 1960/12/11 DOA: 01/05/2020 PCP: Jani Gravel, MD    Chief Complaint  Patient presents with  . Emesis  . Weakness    Brief Narrative:  59 y.o.femalewith medical history significant forpulmonary sarcoidosis with chronic hypoxemia on 4 L via nasal cannula, OSA not on CPAP, chronic diastolic heart failure, hypertension, insulin-dependent type 2 diabetes, seizure and dementia presents with weakness, nausea/vomiting and oliguria.She does have gastroparesis/chronic nausea at baseline but lately gotten worse.At baseline, patientiswheelchair-bound- can only transferfrom bed to the commode.Onwhen her husband was trying to transfer her to thecommode buther legs became weak and she fell on her buttocks. Did not hit her head or loose consciousness. She has chronic back pain and feels like after this fall it is worse. ED Course: Afebrile,hypertensive up to 180/70.No leukocytosis. Anemia at baseline with hemoglobin of 10.4.  Na of 143, K of 3.3, glucose of 108, creatinine of 1.35 which may be her baseline.  UA shows negative nitrite,many bacteria and >50 WBC. She received 1L NS, potassium, 4mg  Zofran, 5mg  Reglan, 25mg  Benadryl and started on Rocephin. Hospital course: Patient admitted to [facility] for[facility] and management.She is receiving IV rocephin for E. Coli UTI.  Subjective: Had three bm this am, no fever, denies ab pain, last vomited two days ago, gi panel pending collection C/o lower extremity being  swollen, left more then right , and discoloration of toes, awaiting for abi C/o dark urine Reports still very weak, not at her baseline Husband at bedside  Assessment & Plan:   Principal Problem:   Weakness Active Problems:   Chronic respiratory failure (HCC)   Diabetes mellitus without complication (HCC)   Diastolic dysfunction   Sarcoidosis of lung ([place])   History of seizure  Acute metabolic  encephalopathy:Present on presentation ,much improved. Ct head on 5/11 showed "Left posterior parietal encephalomalacia consistent with old infarction. No acute intracranial abnormality seen" No reported seizure activity  Acute metabolic encephalopathy likely secondary tohypoglycemia/ electrolyte abnormality/dehydration/UTI/uncontrolled BP  E. coli UTI Has been on Rocephin since admission, plan for total 5 days treatment.   Nausea vomiting , present on presentation Possibly due to UTI and diabetic gastroparesis None in the last 2 days, continue home dose Reglan twice a day  Diarrhea, by report had diarrhea x12 on May 11,  3 bm this morning, Currently denies abdominal pain, no fever, GI pathogen panel  Ordered on 5/12, pending collection  Hypokalemia, replace K, check mag  AKI on CKD 3 a, aki presents on admission -BUN 16 creatinine 1.35 on presentation -She received hydration, BUN 16 creatinine 1.2 today, this is likely close to back to her baseline -Repeat renal function in the morning, renal dosing meds, home meds losartan held since admission  Anemia of chronic disease, likely due to CKD -Hemoglobin stable around 10 -She does has microcytosis, mcv 101.7  -checkB12/ folate   Left LE swelling/ toe discoloration:Patient states she was 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 She states her left leg has always been more swollen than the right. She however noted discoloration left first and third toe on 5/11 thus prompted ABI testing, venous doppler negative for DVT left lower extremity  Diastolic CHF, on Demadex 100mg   as needed at home, she has not received Demadex since in the hospital, will give 1 dose Demadex 40 mg today, reevaluate volume status and renal function in the morning  Malignant hypertension, present on  admission CT head no acute findings did show old stroke Blood pressure improving, will  uptitrate Coreg, stop Norvasc, with the plan to resume losartan prior to discharge if renal function stable  Insulin-dependent type 2 diabetes -Was on Toujeo 25 unit twice a day prior to admission, this has been no hold since admission -A1c only 4.7, fasting blood glucose ranged from 80 to 101 without insulin -Continue monitor on sliding scale,  -She has poor oral intake, likely will not need long-acting insulin at discharge  History of pulmonary sarcoidosis with chronic hypoxia on 4 L at baseline Stable no acute issues  History of seizure on Vimpat, no reported seizure activities Reports seizure in 2015   History of vascular dementia on donepezil  Class III obesity, OSA on CPAP Body mass index is 42.51 kg/m.  Fall/chronic back pain/FTT: PT/OT eval. Fell on buttocks. She can not have mri due to claustrophobia, will avoid ct scan for now due to impaired renal function, will get lumber spine x ray  DVT prophylaxis: Lovenox 40 mg subcu Code Status: DNR Family Communication: Husband at bedside Disposition:   Status is: Inpatient   Dispo: The patient is from: home              Anticipated d/c is to: home with home health               Anticipated d/c date is: in 24hr if  diarrhea , hypokalemia, edema improves, awaiting for ABI, awaiting for lumbar spine x-ray                Consultants:   None  Procedures:   None  Antimicrobials:   Rocephin     Objective: Vitals:   01/07/20 2211 01/08/20 0617 01/08/20 1037 01/08/20 1423  BP: (!) 157/58 (!) 171/64 (!) 148/58 (!) 156/64  Pulse: 77 80  77  Resp: 18 18    Temp: 99.2 F (37.3 C) 98.5 F (36.9 C)  98.6 F (37 C)  TempSrc: Oral Oral  Oral  SpO2: 100% 98%  100%  Weight:      Height:        Intake/Output Summary (Last 24 hours) at 01/08/2020 1544 Last data filed at 01/08/2020 1426 Gross per 24 hour  Intake 720 ml  Output 650 ml  Net 70 ml   Filed Weights   01/05/20 2042  Weight: 108.9 kg     Examination:  General exam: calm, NAD, answers questions appropriately Respiratory system: Clear to auscultation. Respiratory effort normal. Cardiovascular system: S1 & S2 heard, RRR. No JVD, no murmur, No pedal edema. Gastrointestinal system: Abdomen is nondistended, soft and nontender. No organomegaly or masses felt. Normal bowel sounds heard. Central nervous system: Alert and oriented. No focal neurological deficits.  Does has generalized weakness Extremities: Generalized weakness in lower extremities, bilateral lower extremity pitting edema, left worse than right, palpable pedal pulse Skin: No rashes, lesions or ulcers Psychiatry: Judgement and insight appear normal. Mood & affect appropriate.     Data Reviewed: I have personally reviewed following labs and imaging studies  CBC: Recent Labs  Lab 01/05/20 1207 01/06/20 0428  WBC 8.0 8.0  NEUTROABS 6.2  --   HGB 10.4* 9.4*  HCT 34.3* 30.7*  MCV 102.7* 101.7*  PLT 157 865    Basic Metabolic Panel: Recent Labs  Lab 01/05/20 1207 01/06/20 0428 01/07/20 0437 01/08/20 0439  NA 143 142 139 136  K 3.3* 3.1* 3.6 3.3*  CL 108 108 107 106  CO2 26  27 26 26   GLUCOSE 108* 80 101* 93  BUN 16 13 14 16   CREATININE 1.35* 1.24* 1.24* 1.20*  CALCIUM 7.8* 7.3* 7.0* 7.2*    GFR: Estimated Creatinine Clearance: 61.2 mL/min (A) (by C-G formula based on SCr of 1.2 mg/dL (H)).  Liver Function Tests: Recent Labs  Lab 01/05/20 1207  AST 32  ALT 18  ALKPHOS 77  BILITOT 0.5  PROT 5.8*  ALBUMIN 1.6*    CBG: Recent Labs  Lab 01/07/20 1151 01/07/20 1640 01/07/20 2044 01/08/20 0724 01/08/20 1147  GLUCAP 101* 115* 98 93 106*     Recent Results (from the past 240 hour(s))  Urine culture     Status: Abnormal   Collection Time: 01/05/20  6:22 PM   Specimen: Urine, Clean Catch  Result Value Ref Range Status   Specimen Description   Final    URINE, CLEAN CATCH Performed at [facility], [place]  [address] [place], [place] [address]    Special Requests   Final    NONE Performed at [facility], [place] [address], [place], [place] [address]    Culture >=100,000 COLONIES/mL ESCHERICHIA COLI (A)  Final   Report Status 01/07/2020 FINAL  Final   Organism ID, Bacteria ESCHERICHIA COLI (A)  Final      Susceptibility   Escherichia coli - MIC*    AMPICILLIN <=2 SENSITIVE Sensitive     CEFAZOLIN <=4 SENSITIVE Sensitive     CEFTRIAXONE <=1 SENSITIVE Sensitive     CIPROFLOXACIN >=4 RESISTANT Resistant     GENTAMICIN <=1 SENSITIVE Sensitive     IMIPENEM <=0.25 SENSITIVE Sensitive     NITROFURANTOIN <=16 SENSITIVE Sensitive     TRIMETH/SULFA <=20 SENSITIVE Sensitive     AMPICILLIN/SULBACTAM <=2 SENSITIVE Sensitive     PIP/TAZO <=4 SENSITIVE Sensitive     * >=100,000 COLONIES/mL ESCHERICHIA COLI         Radiology Studies: No results found.      Scheduled Meds: . amLODipine  10 mg Oral Daily  . atorvastatin  20 mg Oral Daily  . carvedilol  3.125 mg Oral BID WC  . donepezil  10 mg Oral Daily  . enoxaparin (LOVENOX) injection  40 mg Subcutaneous Q24H  . FLUoxetine  10 mg Oral Daily  . insulin aspart  0-15 Units Subcutaneous TID WC  . lacosamide  200 mg Oral BID  . metoCLOPramide  10 mg Oral Daily   And  . metoCLOPramide  5 mg Oral QHS  . potassium chloride  40 mEq Oral Once   Continuous Infusions: . cefTRIAXone (ROCEPHIN)  IV 1 g (01/08/20 1027)     LOS: 3 days     Time spent: 10mins I have personally reviewed and interpreted on  01/08/2020 daily labs, tele strips, imagings as discussed above under date review session and assessment and plans.  I reviewed all nursing notes, pharmacy notes, vitals, pertinent old records  I have discussed plan of care as described above with RN , patient and family on 01/08/2020  Voice Recognition /Dragon dictation system was used to create this note, attempts have been made to correct errors. Please contact the  author with questions and/or clarifications.   Florencia Reasons, MD PhD FACP [facility]  Available via Epic secure chat 7am-7pm for nonurgent issues Please page for urgent issues To page the attending provider between 7A-7P or the covering provider during after hours 7P-7A, please log into the web site [email or web address] and access using universal [place] password  for that web site. If you do not have the password, please call the hospital operator.    01/08/2020, 3:44 PM

## 2020-01-08 NOTE — Care Management Important Message (Signed)
Important Message  Patient Details IM Letter given to [place] Case Manager to present to the Patient Name: Marissa Wilkerson MRN: [id number] Date of Birth: [birth year]   Medicare Important Message Given:  Yes     Kerin Salen 01/08/2020, 10:42 AM

## 2020-01-08 NOTE — Plan of Care (Signed)

## 2020-01-08 NOTE — Progress Notes (Signed)
Physical Therapy Treatment Patient Details Name: Marissa Wilkerson MRN: [id number] DOB: 1960/12/26 Today's Date: 01/08/2020    History of Present Illness 59 yo female admitted with weakness, UTI, fall at home. 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 Assisted OOB to [facility].  General bed mobility comments: utilizing hospital bed controls and rail, pt required increased time to sit to EOB.  Once upright, pt was able to static sit at Supervision level.  General transfer comment: assisted from elevated bed to [facility] 1/4 pivot turn to pt's R.  Pt was bale to self rise but quickly sat in fear that her legs would give way.  Good safety cognition and use of hand to steady self.  Limited stance time due to fear/Hx of falls.  Then assisted with peri care as pt stood < 30 seconds before sitting back back doen on BSC.  Allowed a brief rest period, then assisted off BSC 1/4 turn towards pt L back to bed.  Again, pt able to self rise but limited stance time. Required increased assist back to bed to support B LE up onto bed and scoot to [facility].  Pt plans to return to home via ambulance. She has all equipment (oxygen, hosp bed, walker, BSC)   Spouse is her care taker.    Follow Up Recommendations  Home health PT;Supervision/Assistance - 24 hour     Equipment Recommendations  None recommended by PT    Recommendations for Other Services       Precautions / Restrictions Precautions Precautions: Fall Precaution Comments: O2 dep @ baseline 4 lts, Neuropathy, fall anxiety Restrictions Weight Bearing Restrictions: No    Mobility  Bed Mobility Overal bed mobility: Needs Assistance Bed Mobility: Supine to Sit;Sit to Supine     Supine to sit: Min assist;Mod assist Sit to supine: Mod assist;Max assist   General bed mobility comments:  utilizing hospital bed controls and rail, pt required increased time to sit to EOB.  Once upright, pt was able to static sit at Supervision level.  Required increased assist back to bed to support B LE up onto bed and scoot to [facility].  Transfers Overall transfer level: Needs assistance Equipment used: Rolling walker (2 wheeled) Transfers: Sit to/from [facility] Sit to Stand: Supervision Stand pivot transfers: Min guard;Min assist       General transfer comment: assisted from elevated bed to [facility] 1/4 pivot turn to pt's R.  Pt was bale to self rise but quickly sat in fear that her legs would give way.  Good safety cognition and use of hand to steady self.  Limited stance time due to fear/Hx of falls.  Then assisted with peri care as pt stood < 30 seconds before sitting back back doen on BSC.  Allowed a brief rest period, then assisted off BSC 1/4 turn towards pt L back to bed.  Again, pt able to self rise but limited stance time.  Ambulation/Gait             General Gait Details: unable to attempt gait due to B LE weakness, edema and pt's anxiety.   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      Exercises      General Comments        Pertinent Vitals/Pain Pain Assessment: 0-10    Home Living                      Prior Function            PT Goals (current goals can now be found in the care plan section) Progress towards PT goals: Progressing toward goals    Frequency    Min 3X/week      PT Plan  Current plan remains appropriate    Co-evaluation              AM-PAC PT "6 Clicks" Mobility   Outcome Measure  Help needed turning from your back to your side while in a flat bed without using bedrails?: A Little Help needed moving from lying on your back to sitting on the side of a flat bed without using bedrails?: A Little Help needed moving to and from a bed to a chair (including a wheelchair)?: A Little Help needed standing up from a chair using your arms (e.g., wheelchair or bedside chair)?: A Little Help needed to walk in hospital room?: A Lot Help needed climbing 3-5 steps with a railing? : Total 6 Click Score: 15    End of Session Equipment Utilized During Treatment: Gait belt;Oxygen Activity Tolerance: Patient limited by fatigue Patient left: in bed;with call bell/phone within reach;with bed alarm set Nurse Communication: Mobility status PT Visit Diagnosis: Muscle weakness (generalized) (M62.81);History of falling (Z91.81);Difficulty in walking, not elsewhere classified (R26.2)     Time: 2595-6387 PT Time Calculation (min) (ACUTE ONLY): 34 min  Charges:  $Therapeutic Activity: 23-37 mins                     Rica Koyanagi  PTA Acute  Rehabilitation Services Pager      [phone] Office      [phone]

## 2020-01-09 ENCOUNTER — Inpatient Hospital Stay (HOSPITAL_COMMUNITY): Payer: Medicare Other

## 2020-01-09 DIAGNOSIS — G9341 Metabolic encephalopathy: Secondary | ICD-10-CM

## 2020-01-09 DIAGNOSIS — Z9981 Dependence on supplemental oxygen: Secondary | ICD-10-CM

## 2020-01-09 DIAGNOSIS — L819 Disorder of pigmentation, unspecified: Secondary | ICD-10-CM

## 2020-01-09 DIAGNOSIS — G4733 Obstructive sleep apnea (adult) (pediatric): Secondary | ICD-10-CM

## 2020-01-09 DIAGNOSIS — Z9989 Dependence on other enabling machines and devices: Secondary | ICD-10-CM

## 2020-01-09 DIAGNOSIS — F0150 Vascular dementia without behavioral disturbance: Secondary | ICD-10-CM

## 2020-01-09 LAB — CBC WITH DIFFERENTIAL/PLATELET
Abs Immature Granulocytes: 0.03 10*3/uL (ref 0.00–0.07)
Basophils Absolute: 0 10*3/uL (ref 0.0–0.1)
Basophils Relative: 0 %
Eosinophils Absolute: 0.3 10*3/uL (ref 0.0–0.5)
Eosinophils Relative: 3 %
HCT: 30.1 % — ABNORMAL LOW (ref 36.0–46.0)
Hemoglobin: 9.3 g/dL — ABNORMAL LOW (ref 12.0–15.0)
Immature Granulocytes: 0 %
Lymphocytes Relative: 20 %
Lymphs Abs: 1.5 10*3/uL (ref 0.7–4.0)
MCH: 31.4 pg (ref 26.0–34.0)
MCHC: 30.9 g/dL (ref 30.0–36.0)
MCV: 101.7 fL — ABNORMAL HIGH (ref 80.0–100.0)
Monocytes Absolute: 1 10*3/uL (ref 0.1–1.0)
Monocytes Relative: 13 %
Neutro Abs: 4.8 10*3/uL (ref 1.7–7.7)
Neutrophils Relative %: 64 %
Platelets: 165 10*3/uL (ref 150–400)
RBC: 2.96 MIL/uL — ABNORMAL LOW (ref 3.87–5.11)
RDW: 13.5 % (ref 11.5–15.5)
WBC: 7.6 10*3/uL (ref 4.0–10.5)
nRBC: 0 % (ref 0.0–0.2)

## 2020-01-09 LAB — VITAMIN B12: Vitamin B-12: 412 pg/mL (ref 180–914)

## 2020-01-09 LAB — BASIC METABOLIC PANEL
Anion gap: 5 (ref 5–15)
BUN: 18 mg/dL (ref 6–20)
CO2: 28 mmol/L (ref 22–32)
Calcium: 7.2 mg/dL — ABNORMAL LOW (ref 8.9–10.3)
Chloride: 105 mmol/L (ref 98–111)
Creatinine, Ser: 1.39 mg/dL — ABNORMAL HIGH (ref 0.44–1.00)
GFR calc Af Amer: 49 mL/min — ABNORMAL LOW (ref >60)
GFR calc non Af Amer: 42 mL/min — ABNORMAL LOW (ref >60)
Glucose, Bld: 114 mg/dL — ABNORMAL HIGH (ref 70–99)
Potassium: 3.4 mmol/L — ABNORMAL LOW (ref 3.5–5.1)
Sodium: 138 mmol/L (ref 135–145)

## 2020-01-09 LAB — FOLATE: Folate: 1.9 ng/mL — ABNORMAL LOW (ref >5.9)

## 2020-01-09 LAB — GLUCOSE, CAPILLARY
Glucose-Capillary: 94 mg/dL (ref 70–99)
Glucose-Capillary: 105 mg/dL — ABNORMAL HIGH (ref 70–99)

## 2020-01-09 LAB — MAGNESIUM: Magnesium: 1.6 mg/dL — ABNORMAL LOW (ref 1.7–2.4)

## 2020-01-09 MED ORDER — CARVEDILOL 12.5 MG PO TABS: 12.5000 mg | ORAL_TABLET | Freq: Two times a day (BID) | ORAL | Status: DC

## 2020-01-09 MED ORDER — POTASSIUM CHLORIDE CRYS ER 20 MEQ PO TBCR
40.0000 meq | EXTENDED_RELEASE_TABLET | Freq: Once | ORAL | Status: AC
  Administered 2020-01-09: 40 meq via ORAL
  Filled 2020-01-09: qty 2

## 2020-01-09 MED ORDER — FOLIC ACID 1 MG PO TABS
1.0000 mg | ORAL_TABLET | Freq: Every day | ORAL | 0 refills | Status: AC
Start: 2020-01-09 — End: 2021-01-08

## 2020-01-09 MED ORDER — INSULIN LISPRO 100 UNIT/ML ~~LOC~~ SOLN: SUBCUTANEOUS | 0 refills | Status: AC

## 2020-01-09 MED ORDER — VITAMIN B-12 1000 MCG PO TABS: 1000.0000 ug | ORAL_TABLET | Freq: Every day | ORAL | 0 refills | Status: AC

## 2020-01-09 MED ORDER — MAGNESIUM SULFATE IN D5W 1-5 GM/100ML-% IV SOLN
1.0000 g | Freq: Once | INTRAVENOUS | Status: AC
  Administered 2020-01-09: 1 g via INTRAVENOUS
  Filled 2020-01-09: qty 100

## 2020-01-09 MED ORDER — LOSARTAN POTASSIUM 25 MG PO TABS
25.0000 mg | ORAL_TABLET | Freq: Every day | ORAL | 0 refills | Status: DC
Start: 2020-01-09 — End: 2020-05-07

## 2020-01-09 MED ORDER — CARVEDILOL 12.5 MG PO TABS: 12.5000 mg | ORAL_TABLET | Freq: Two times a day (BID) | ORAL | 0 refills | Status: DC

## 2020-01-09 NOTE — TOC Transition Note (Signed)
Transition of Care [facility]) - CM/SW Discharge Note   Patient Details  Name: Marissa Wilkerson MRN: [id number] Date of Birth: 11-24-1960  [facility] CM/SW Contact:  Lennart Pall, LCSW Phone Number: 01/09/2020, 1:24 PM   Clinical Narrative:  Pt cleared for d/c today.  Alerted [facility] and will start services over weekend.  All DME delivered.  Pt feeling ready to go!  Will transport home via [place].    Final next level of care: [place] Barriers to Discharge: Barriers Resolved   Patient Goals and CMS Choice Patient states their goals for this hospitalization and ongoing recovery are:: need more assistance at home CMS Medicare.gov Compare Post Acute Care list provided to:: Patient Choice offered to / list presented to : Patient  Discharge Placement                Patient to be transferred to facility by: home via [place] Name of family member notified: spouse Patient and family notified of of transfer: 01/09/20  Discharge Plan and Services In-house Referral: Clinical Social Work Discharge Planning Services: CM Consult Post Acute Care Choice: Home Health          DME Arranged: Overbed table, Hospital bed(hoyer lift) DME Agency: [employer] Date DME Agency Contacted: 01/06/20 Time DME Agency Contacted: [place]: PT, OT, Nurse's Aide, Social Work [employer] Agency: [place] Date [place]: 01/07/20 Time [place]: 1200 Representative spoke with at [place]: [place] ([place]) Interventions     Readmission Risk Interventions No flowsheet data found.

## 2020-01-09 NOTE — Progress Notes (Signed)
Instructions were reviewed with patient. All questions were answered. Patient was transported via ambulance.

## 2020-01-09 NOTE — Progress Notes (Signed)
ABI completed. Refer to "CV Proc" under chart review to view preliminary results.  01/09/2020 11:18 AM Kelby Aline., MHA, RVT, RDCS, RDMS

## 2020-01-09 NOTE — Discharge Summary (Signed)
Discharge Summary  Marissa Wilkerson OBS:[id number] DOB: August 04, 1961  PCP: Jani Gravel, MD  Admit date: 01/05/2020 Discharge date: 01/09/2020  Time spent: 71mns, more than 50% time spent on coordination of care.  Recommendations for Outpatient Follow-up:  1. F/u with PCP within a week  for hospital discharge follow up, repeat cbc/bmp/mag at follow up 2. Home health/home DME arranged 3. Patient needs ambulance transport  to go home , arranged by case manager  Discharge Diagnoses:  Active Hospital Problems   Diagnosis Date Noted  . Weakness 01/05/2020  . History of seizure 01/05/2020  . Chronic respiratory failure ([facility] 09/02/2013  . Sarcoidosis of lung ([facility] 09/02/2013  . Diabetes mellitus without complication ([facility]   . Diastolic dysfunction     Resolved Hospital Problems  No resolved problems to display.    Discharge Condition: stable  Diet recommendation: heart healthy/carb modified   Filed Weights   01/05/20 2042  Weight: 108.9 kg    History of present illness: (per admitting MD Dr TFlossie Buffy PCP: KJani Gravel MD  Patient coming from: Home  I have personally briefly reviewed [place] Chief Complaint: weakness   HPI: Marissa OKAis a 59y.o. 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  Patient reports that she has chronic nausea and vomiting due to her gastroparesis but this has gotten worse in the past several days.  She has had decreased p.o. intake and has noticed decreased urinary retention.  Feels like she could go 24 hours without much urination.  Denies any dysuria or increased urgency or frequency. She also notes increasing weakness yesterday when her husband was  trying to transfer her to the commode but  her legs became weak and she fell on her buttocks.  Did not hit her head or lose any consciousness.  She already has some chronic back pain and feels like after this fall it is worse. At baseline, patient  has chronic weakness and can only transfer from bed to the commode.  She is otherwise wheelchair-bound. Husband feels like he is having a harder time caring for her at home alone and has already met with case manager in the ED to help set up home health.  She denies any fevers.  No abdominal pain.  No shortness of breath.  No chest pain but she has felt palpitations and says she has had PVCs in the past.  Patient denies tobacco, alcohol or illicit drug use.   ED Course:  She was afebrile and hypertensive up to 180/70. No leukocytosis. Anemia at baseline with hemoglobin of 10.4.  Na of 143, K of 3.3, glucose of 108, creatinine of 1.35 which may be her baseline.  UA shows trace leukocytes, negative nitrate with many bacteria and >50 WBC.   She received 1L NS, potassium, 4637mZofran, 37m41meglan, 237m49mnadryl and started on Rocephin.    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 No acute intracranial abnormality seen" -No reported seizure activity  -Acute metabolic encephalopathy likelysecondary tohypoglycemia/ electrolyte abnormality/dehydration/UTI/uncontrolled BP  E. coli UTI Has been on Rocephin since admission, finished total 5 days treatment in the  hospital   Nausea vomiting , present on presentation Possibly due to UTI and diabetic gastroparesis Resolved,  continue home dose Reglan twice a day  Diarrhea, by report had diarrhea x12 on May 11,  Resolved, Currently denies abdominal pain, no fever, GI pathogen panel  Ordered on 5/12, not collected.  Hypokalemia/hypomagnesemia, replace K,/mag, repeat at hospital discharge follow up.  AKI on CKD 3 a, aki presents on admission -BUN 16 creatinine 1.35 on presentation -She received hydration, BUN 16 creatinine 1.2 on 5/13, - Cr 1.39 on 5/14, not far from  Baseline, urine is clear -renal dosing meds,  -home meds losartan held since admission, resumed at a lower dose at discharge, pcp to repeat bmp at hospital discharge follow up  Anemia of chronic disease, likely due to CKD -Hemoglobin stable around 10 -She does has microcytosis, mcv 101.7  -B12/ folate slightly low, will start supplement  Left LE swelling/ toe discoloration:Patient states she was 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 She states her left leg has always been more swollen than the right. She however noted discoloration left first and third toe on 5/11 thus prompted ABI testing, ABI is unremarkable, venous doppler negative for DVT left lower extremity Elevate legs, continue home dose demadex  Follow up with pcp  Diastolic CHF, on Demadex [id number]  as needed at home, she has not received Demadex since in the hospital, will give 1 dose Demadex 40 mg on 5/13 Resume home does demadex at discharge  Malignant hypertension, presents on admission CT head no acute findings did show old stroke she is discharged on increase dose of coreg stop Norvasc due to lower extremity edema,   resume losartan at a lower dose F/u with pcp to continue adjust bp meds as needed  Insulin-dependent type 2 diabetes -Was on Toujeo 25 unit twice a day prior to admission,  this has been no hold since admission -A1c only 4.7, fasting blood glucose ranged from 80 to 101 without insulin -she did not require any sliding scale coverage while in the hospital -long-acting insulin discontinued  at discharge, she is sent home on sliding scale insulin  History of pulmonary sarcoidosis with chronic hypoxia on 4 L at baseline Stable no acute issues  History of seizure on Vimpat, no reported seizure activities Reports seizure in 2015   History of vascular dementia on donepezil, slightly confused about the time but able to self correct.  Class III obesity, OSA on CPAP Body mass index is 42.51 kg/m.  Fall/chronic back pain/FTT: PT/OT eval. Fell on buttocks. She can not have mri due to claustrophobia, will avoid ct scan for now due to impaired renal function,  lumber spine x ray no acute findings F/u with pcp  DVT prophylaxis while in the hospital : Lovenox 40 mg subcu Code Status: DNR Family Communication: Husband daily  Disposition:   Status is: Inpatient  Dispo: The patient is from: home  Anticipated d/c is to: home with home health      Consultants:   None  Procedures:   None  Antimicrobials:   Rocephin  Discharge Exam: BP (!) 175/61 (BP Location: Right Arm)   Pulse 79  Temp 98.5 F (36.9 C) (Oral)   Resp 18   Ht '5\' 3"'  (1.6 m)   Wt 108.9 kg   LMP 08/28/2008   SpO2 100%   BMI 42.51 kg/m   General: NAD, slightly confused about the time but able to self correct Cardiovascular: RRR Respiratory: diminished at basis, no rhonchi, no rales, no wheezing  Discharge Instructions You were cared for by a hospitalist during your hospital stay. If you have any questions about your discharge medications or the care you received while you were in the hospital after you are discharged, you can call the unit and asked to speak with the hospitalist on call if the hospitalist that took care of you is not  available. Once you are discharged, your primary care physician will handle any further medical issues. Please note that NO REFILLS for any discharge medications will be authorized once you are discharged, as it is imperative that you return to your primary care physician (or establish a relationship with a primary care physician if you do not have one) for your aftercare needs so that they can reassess your need for medications and monitor your lab values.  Discharge Instructions    Diet - low sodium heart healthy   Complete by: As directed    Carb modified diet   Increase activity slowly   Complete by: As directed      Allergies as of 01/09/2020      Reactions   Cymbalta [duloxetine Hcl] Other (See Comments)   Hallucinations    Fentanyl Other (See Comments)   Patches. Blistered skin.    Metformin And Related Nausea And Vomiting   Mobic [meloxicam] Nausea And Vomiting   Sulfa Antibiotics Rash   Voltaren [diclofenac] Nausea And Vomiting      Medication List    STOP taking these medications   Insulin Glargine 300 UNIT/ML Sopn Commonly known as: Toujeo SoloStar     TAKE these medications   albuterol 108 (90 Base) MCG/ACT inhaler Commonly known as: VENTOLIN HFA Inhale 2 puffs into the lungs every 6 (six) hours as needed for wheezing or shortness of breath.   atorvastatin 20 MG tablet Commonly known as: LIPITOR Take 1 tablet (20 mg total) by mouth daily.   carvedilol 12.5 MG tablet Commonly known as: COREG Take 1 tablet (12.5 mg total) by mouth 2 (two) times daily with a meal. What changed:   medication strength  how much to take   donepezil 10 MG tablet Commonly known as: ARICEPT Take 10 mg by mouth daily.   FLUoxetine 10 MG capsule Commonly known as: PROZAC Take 10 mg by mouth daily.   fluticasone 50 MCG/ACT nasal spray Commonly known as: FLONASE Place 1 spray into both nostrils daily. What changed:   when to take this  reasons to take this   folic acid 1  MG tablet Commonly known as: FOLVITE Take 1 tablet (1 mg total) by mouth daily.   HYDROcodone-acetaminophen 10-325 MG tablet Commonly known as: NORCO Take 0.5-1 tablets by mouth every 6 (six) hours as needed for moderate pain or severe pain.   insulin lispro 100 UNIT/ML injection Commonly known as: HUMALOG Before each meal 3 times a day, 140-199 - 2 units, 200-250 - 4 units, 251-299 - 6 units,  300-349 - 8 units,  350 or above 10 units. Insulin PEN if approved, provide syringes and needles if needed. What changed:   how much to take  how to take this  when to take this  additional instructions   lacosamide 200 MG Tabs tablet Commonly known as: VIMPAT Take 1 tablet by mouth 2 (two) times daily.   losartan 25 MG tablet Commonly known as: Cozaar Take 1 tablet (25 mg total) by mouth daily. What changed:   medication strength  how much to take   metoCLOPramide 5 MG tablet Commonly known as: REGLAN Take 5-10 mg by mouth in the morning and at bedtime. Take 2 tablets (10 mg) in the morning and Take 1 tablet (5 mg) in the evening   metolazone 10 MG tablet Commonly known as: ZAROXOLYN Take 10 mg by mouth daily as needed (fluid).   promethazine 25 MG tablet Commonly known as: PHENERGAN Take 25 mg by mouth every 6 (six) hours as needed for nausea or vomiting.   tiZANidine 4 MG tablet Commonly known as: ZANAFLEX Take 1 tablet (4 mg total) by mouth every 8 (eight) hours as needed for muscle spasms. What changed: how much to take   torsemide 100 MG tablet Commonly known as: DEMADEX Take 100 mg by mouth daily as needed (fluid).   vitamin B-12 1000 MCG tablet Commonly known as: CYANOCOBALAMIN Take 1 tablet (1,000 mcg total) by mouth daily.            Durable Medical Equipment  (From admission, onward)         Start     Ordered   01/05/20 1759  For home use only DME Bedside commode  Once    Comments: Drop arm  Height 5'3" Weight 113.9 kg  Question Answer  Comment  Patient needs a bedside commode to treat with the following condition Wheelchair bound   Patient needs a bedside commode to treat with the following condition CHF (congestive heart failure) ([facility])      01/05/20 1759   01/05/20 1746  For home use only DME Other see comment  Once    Comments: Sliding board Height 5'3" Weight 113.9 kg  Question:  Length of Need  Answer:  Lifetime   01/05/20 1746   01/05/20 1744  For home use only DME Overbed table  Once     01/05/20 1746   01/05/20 1743  For home use only DME Hospital bed  Once    Question Answer Comment  Length of Need Lifetime   Patient has (list medical condition): chronic weaknes, CHF, Sarcoidosis of lung, wheelchair bound   The above medical condition requires: Patient requires the ability to reposition frequently   Head must be elevated greater than: 45 degrees   Bed type Semi-electric   [employer] Yes   Trapeze Bar Yes   Support Surface: Gel Overlay      01/05/20 1746         Allergies  Allergen Reactions  . Cymbalta [Duloxetine Hcl] Other (See Comments)    Hallucinations   . Fentanyl Other (See Comments)    Patches. Blistered skin.   . Metformin And Related Nausea And Vomiting  . Mobic [Meloxicam] Nausea And Vomiting  . Sulfa Antibiotics Rash  . Voltaren [Diclofenac] Nausea And Vomiting   Follow-up Information    Jani Gravel, MD Follow up in 1 week(s).   Specialty: Internal Medicine Why: hospital discharge follow up, repeat cbc/bmp/mag at discharge follow up Contact information: [address] [place] [address] [phone]            The results of significant diagnostics from this hospitalization (including imaging, microbiology, ancillary and laboratory) are listed below for reference.  Significant Diagnostic Studies: DG Lumbar Spine 2-3 Views  Result Date: 01/08/2020 CLINICAL DATA:  Low back pain.  Multiple recent falls EXAM: LUMBAR SPINE - 2-3 VIEW COMPARISON:   09/26/2013 FINDINGS: Normal alignment.  No fracture.  Disc spaces maintained. IMPRESSION: No acute bony abnormality. Electronically Signed   By: Rolm Baptise M.D.   On: 01/08/2020 19:24   CT HEAD WO CONTRAST  Result Date: 01/06/2020 CLINICAL DATA:  Altered mental status. EXAM: CT HEAD WITHOUT CONTRAST TECHNIQUE: Contiguous axial images were obtained from the base of the skull through the vertex without intravenous contrast. COMPARISON:  November 18, 2017. FINDINGS: Brain: Left posterior parietal encephalomalacia is noted consistent with old infarction. No mass effect or midline shift is noted. Ventricular size is within normal limits. There is no evidence of mass lesion, hemorrhage or acute infarction. Vascular: No hyperdense vessel or unexpected calcification. Skull: Normal. Negative for fracture or focal lesion. Sinuses/Orbits: No acute finding. Other: None. 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 No acute intracranial abnormality seen. Electronically Signed   By: Marijo Conception M.D.   On: 01/06/2020 14:01   VAS [place] ABI WITH/WO TBI  Result Date: 01/09/2020 LOWER EXTREMITY DOPPLER STUDY Indications: Discolored left great toe after fall.  Limitations: Today's exam was limited due to involuntary patient movement. Comparison Study: No prior study Performing Technologist: Maudry Mayhew MHA, RVT, RDCS, RDMS  Examination Guidelines: A complete evaluation includes at minimum, Doppler waveform signals and systolic blood pressure reading at the level of bilateral brachial, anterior tibial, and posterior tibial arteries, when vessel segments are accessible. Bilateral testing is considered an integral part of a complete examination. Photoelectric Plethysmograph (PPG) waveforms and toe systolic pressure readings are included as required and additional duplex testing as needed. Limited examinations for reoccurring indications may be performed as noted.  ABI Findings:  +---------+------------------+-----+---------+--------+ Right    Rt Pressure (mmHg)IndexWaveform Comment  +---------+------------------+-----+---------+--------+ Brachial 193                    triphasic         +---------+------------------+-----+---------+--------+ PTA      192               0.99 triphasic         +---------+------------------+-----+---------+--------+ DP       210               1.09 triphasic         +---------+------------------+-----+---------+--------+ Great Toe195               1.01                   +---------+------------------+-----+---------+--------+ +---------+------------------+-----+---------+-------+ Left     Lt Pressure (mmHg)IndexWaveform Comment +---------+------------------+-----+---------+-------+ Brachial 178                    triphasic        +---------+------------------+-----+---------+-------+ PTA      192               0.99 triphasic        +---------+------------------+-----+---------+-------+ DP       192               0.99 triphasic        +---------+------------------+-----+---------+-------+ Great Toe180               0.93                  +---------+------------------+-----+---------+-------+ +-------+-----------+-----------+------------+------------+ ABI/TBIToday's  ABIToday's TBIPrevious ABIPrevious TBI +-------+-----------+-----------+------------+------------+ Right  1.09       1.01                                +-------+-----------+-----------+------------+------------+ Left   0.99       0.93                                +-------+-----------+-----------+------------+------------+  Summary: Right: Resting right ankle-brachial index is within normal range. No evidence of significant right lower extremity arterial disease. The right toe-brachial index is normal. Left: Resting left ankle-brachial index is within normal range. No evidence of significant left lower extremity arterial  disease. The left toe-brachial index is normal.  *See table(s) above for measurements and observations.    Preliminary    VAS [place] LOWER EXTREMITY VENOUS (DVT)  Result Date: 01/06/2020  Lower Venous DVTStudy Indications: Swelling, and Edema.  Comparison Study: 09/20/17 previous Performing Technologist: Abram Sander RVS  Examination Guidelines: A complete evaluation includes B-mode imaging, spectral Doppler, color Doppler, and power Doppler as needed of all accessible portions of each vessel. Bilateral testing is considered an integral part of a complete examination. Limited examinations for reoccurring indications may be performed as noted. The reflux portion of the exam is performed with the patient in reverse Trendelenburg.  +-----+---------------+---------+-----------+----------+--------------+ RIGHTCompressibilityPhasicitySpontaneityPropertiesThrombus Aging +-----+---------------+---------+-----------+----------+--------------+ CFV  Full           Yes      Yes                                 +-----+---------------+---------+-----------+----------+--------------+   +---------+---------------+---------+-----------+----------+--------------+ LEFT     CompressibilityPhasicitySpontaneityPropertiesThrombus Aging +---------+---------------+---------+-----------+----------+--------------+ CFV      Full           Yes      Yes                                 +---------+---------------+---------+-----------+----------+--------------+ SFJ      Full                                                        +---------+---------------+---------+-----------+----------+--------------+ FV Prox  Full                                                        +---------+---------------+---------+-----------+----------+--------------+ FV Mid   Full                                                        +---------+---------------+---------+-----------+----------+--------------+ FV Distal                Yes      Yes                                 +---------+---------------+---------+-----------+----------+--------------+  PFV      Full                                                        +---------+---------------+---------+-----------+----------+--------------+ POP      Full           Yes      Yes                                 +---------+---------------+---------+-----------+----------+--------------+ PTV      Full                                                        +---------+---------------+---------+-----------+----------+--------------+ PERO     Full                                                        +---------+---------------+---------+-----------+----------+--------------+     Summary: RIGHT: - No evidence of common femoral vein obstruction.  LEFT: - There is no evidence of deep vein thrombosis in the lower extremity.  - No cystic structure found in the popliteal fossa.  *See table(s) above for measurements and observations. Electronically signed by Harold Barban MD on 01/06/2020 at 9:45:26 PM.    Final     Microbiology: Recent Results (from the past 240 hour(s))  Urine culture     Status: Abnormal   Collection Time: 01/05/20  6:22 PM   Specimen: Urine, Clean Catch  Result Value Ref Range Status   Specimen Description   Final    URINE, CLEAN CATCH Performed at [facility] [address] [place], [place] [address]    Special Requests   Final    NONE Performed at [facility], [place] [address], [place], [place] [address]    Culture >=100,000 COLONIES/mL ESCHERICHIA COLI (A)  Final   Report Status 01/07/2020 FINAL  Final   Organism ID, Bacteria ESCHERICHIA COLI (A)  Final      Susceptibility   Escherichia coli - MIC*    AMPICILLIN <=2 SENSITIVE Sensitive     CEFAZOLIN <=4 SENSITIVE Sensitive     CEFTRIAXONE <=1 SENSITIVE Sensitive     CIPROFLOXACIN >=4 RESISTANT Resistant     GENTAMICIN <=1  SENSITIVE Sensitive     IMIPENEM <=0.25 SENSITIVE Sensitive     NITROFURANTOIN <=16 SENSITIVE Sensitive     TRIMETH/SULFA <=20 SENSITIVE Sensitive     AMPICILLIN/SULBACTAM <=2 SENSITIVE Sensitive     PIP/TAZO <=4 SENSITIVE Sensitive     * >=100,000 COLONIES/mL ESCHERICHIA COLI     Labs: Basic Metabolic Panel: Recent Labs  Lab 01/05/20 1207 01/06/20 0428 01/07/20 0437 01/08/20 0439 01/09/20 0455  NA 143 142 139 136 138  K 3.3* 3.1* 3.6 3.3* 3.4*  CL 108 108 107 106 105  CO2 '26 27 26 26 28  ' GLUCOSE 108* 80 101* 93 114*  BUN '16 13 14 16 18  ' CREATININE 1.35* 1.24* 1.24* 1.20* 1.39*  CALCIUM 7.8* 7.3* 7.0* 7.2* 7.2*  MG  --   --   --   --  1.6*   Liver Function Tests: Recent Labs  Lab 01/05/20 1207  AST 32  ALT 18  ALKPHOS 77  BILITOT 0.5  PROT 5.8*  ALBUMIN 1.6*   Recent Labs  Lab 01/05/20 1207  LIPASE 15   No results for input(s): AMMONIA in the last 168 hours. CBC: Recent Labs  Lab 01/05/20 1207 01/06/20 0428 01/09/20 0455  WBC 8.0 8.0 7.6  NEUTROABS 6.2  --  4.8  HGB 10.4* 9.4* 9.3*  HCT 34.3* 30.7* 30.1*  MCV 102.7* 101.7* 101.7*  PLT 157 151 165   Cardiac Enzymes: No results for input(s): CKTOTAL, CKMB, CKMBINDEX, TROPONINI in the last 168 hours. BNP: BNP (last 3 results) No results for input(s): BNP in the last 8760 hours.  ProBNP (last 3 results) No results for input(s): PROBNP in the last 8760 hours.  CBG: Recent Labs  Lab 01/08/20 1147 01/08/20 1558 01/08/20 2059 01/09/20 0727 01/09/20 1225  GLUCAP 106* 113* 100* 94 105*       Signed:  Florencia Reasons MD, PhD, FACP  [facility] 01/09/2020, 1:32 PM

## 2020-01-13 MED FILL — VIMPAT 200 MG TABLET: 200 | 90 days supply | Qty: 180 | Fill #0

## 2020-01-13 MED FILL — DONEPEZIL HCL 10 MG TABLET: 10 | 30 days supply | Qty: 30 | Fill #0

## 2020-01-28 MED FILL — CARVEDILOL 3.125 MG TABLET: 3.125 | 30 days supply | Qty: 60 | Fill #0

## 2020-01-28 MED FILL — METOCLOPRAMIDE 5 MG TABLET: 5 | 30 days supply | Qty: 90 | Fill #0

## 2020-01-30 MED FILL — HYDROCODON-APAP 10-325: 10-325 | 20 days supply | Qty: 120 | Fill #0

## 2020-02-09 MED FILL — DONEPEZIL HCL 10 MG TABLET: 10 | 30 days supply | Qty: 30 | Fill #1

## 2020-02-27 MED FILL — LOSARTAN POTASSIUM 50 MG TA: 50 | 90 days supply | Qty: 90 | Fill #7

## 2020-03-02 MED FILL — METOCLOPRAMIDE 5 MG TABLET: 5 | 30 days supply | Qty: 90 | Fill #0

## 2020-03-02 MED FILL — CARVEDILOL 3.125 MG TABLET: 3.125 | 30 days supply | Qty: 60 | Fill #0

## 2020-03-02 MED FILL — HYDROCODON-APAP 10-325: 10-325 | 20 days supply | Qty: 120 | Fill #0

## 2020-03-02 MED FILL — PROMETHAZINE 25 MG TABLET: 25 | 90 days supply | Qty: 360 | Fill #0

## 2020-03-08 MED FILL — DONEPEZIL HCL 10 MG TABLET: 10 | 30 days supply | Qty: 30 | Fill #0

## 2020-03-19 ENCOUNTER — Telehealth: Payer: Self-pay | Admitting: Physician Assistant

## 2020-03-19 NOTE — Telephone Encounter (Signed)
I connected by phone with Marissa Wilkerson and/or patient's caregiver on 03/19/2020 at 3:01 PM to discuss the potential vaccination through our Homebound vaccination initiative.   Prevaccination Checklist for COVID-19 Vaccines  1.  Are you feeling sick today? no  2.  Have you ever received a dose of a COVID-19 vaccine?  no      If yes, which one? None   3.  Have you ever had an allergic reaction: (This would include a severe reaction [ e.g., anaphylaxis] that required treatment with epinephrine or EpiPen or that caused you to go to the hospital.  It would also include an allergic reaction that occurred within 4 hours that caused hives, swelling, or respiratory distress, including wheezing.) A.  A previous dose of COVID-19 vaccine. no  B.  A vaccine or injectable therapy that contains multiple components, one of which is a COVID-19 vaccine component, but it is not known which component elicited the immediate reaction. no  C.  Are you allergic to polyethylene glycol? no  D. Are you allergic to Polysorbate, which is found in some vaccines, film coated tablets and intravenous steroids?  no   4.  Have you ever had an allergic reaction to another vaccine (other than COVID-19 vaccine) or an injectable medication? (This would include a severe reaction [ e.g., anaphylaxis] that required treatment with epinephrine or EpiPen or that caused you to go to the hospital.  It would also include an allergic reaction that occurred within 4 hours that caused hives, swelling, or respiratory distress, including wheezing.)  no   5.  Have you ever had a severe allergic reaction (e.g., anaphylaxis) to something other than a component of the COVID-19 vaccine, or any vaccine or injectable medication?  This would include food, pet, venom, environmental, or oral medication allergies.  no   6.  Have you received any vaccine in the last 14 days? no   7.  Have you ever had a positive test for COVID-19 or has a doctor ever told you  that you had COVID-19?  no   8.  Have you received passive antibody therapy (monoclonal antibodies or convalescent serum) as a treatment for COVID-19? no   9.  Do you have a weakened immune system caused by something such as HIV infection or cancer or do you take immunosuppressive drugs or therapies?  no   10.  Do you have a bleeding disorder or are you taking a blood thinner? no   11.  Are you pregnant or breast-feeding? no   12.  Do you have dermal fillers? no   __________________   This patient is a 59 y.o. female that meets the FDA criteria to receive homebound vaccination. Patient or parent/caregiver understands they have the option to accept or refuse homebound vaccination.  Patient passed the pre-screening checklist and would like to proceed with homebound vaccination.  Based on questionnaire above, I recommend the patient be observed for 15 minutes.  There are no other household members/caregivers who are also interested in receiving the vaccine.     I will send the patient's information to our scheduling team who will reach out to schedule the patient and potential caregiver/family members for homebound vaccination.    Angelena Form 03/19/2020 3:01 PM

## 2020-03-19 NOTE — Telephone Encounter (Signed)
Called to discuss the homebound Covid-19 vaccination initiative with the patient and/or caregiver.   Message left to call back.  Vicent Febles PA-C  [facility]     

## 2020-03-22 MED FILL — ATORVASTATIN 20 MG TABLET: 20 | 30 days supply | Qty: 30 | Fill #0

## 2020-03-22 MED FILL — FLUoxetine HCL 10 MG CAPS: 10 | 90 days supply | Qty: 90 | Fill #1

## 2020-03-30 ENCOUNTER — Ambulatory Visit: Payer: Medicare Other

## 2020-03-31 ENCOUNTER — Ambulatory Visit: Payer: Medicare Other

## 2020-03-31 ENCOUNTER — Ambulatory Visit: Payer: Medicare Other | Attending: Critical Care Medicine

## 2020-03-31 DIAGNOSIS — Z23 Encounter for immunization: Secondary | ICD-10-CM

## 2020-03-31 NOTE — Progress Notes (Signed)
   Covid-19 Vaccination Clinic  Name:  Marissa Wilkerson    MRN: [id number] DOB: [birth year]  03/31/2020  [name] was observed post Covid-19 immunization for 15 minutes without incident. She was provided with Vaccine Information Sheet and instruction to access the V-Safe system.   [name] was instructed to call 911 with any severe reactions post vaccine: [name] Difficulty breathing  . Swelling of face and throat  . A fast heartbeat  . A bad rash all over body  . Dizziness and weakness   Immunizations Administered    Name Date Dose VIS Date Route   Moderna COVID-19 Vaccine 03/31/2020  2:10 PM 0.5 mL 07/2019 Intramuscular   Manufacturer: Moderna   Lot: [id number]   [place]: [id number]

## 2020-04-01 MED FILL — HYDROCODON-APAP 10-325: 10-325 | 20 days supply | Qty: 120 | Fill #0

## 2020-04-01 MED FILL — METOCLOPRAMIDE 5 MG TABLET: 5 | 30 days supply | Qty: 90 | Fill #0

## 2020-04-01 MED FILL — DONEPEZIL HCL 10 MG TABLET: 10 | 30 days supply | Qty: 30 | Fill #1

## 2020-04-19 MED FILL — VIMPAT 200 MG TABLET: 200 | 90 days supply | Qty: 180 | Fill #0

## 2020-04-22 ENCOUNTER — Other Ambulatory Visit (HOSPITAL_COMMUNITY): Payer: Self-pay | Admitting: Internal Medicine

## 2020-04-22 MED FILL — CARVEDILOL 6.25 MG TABLET: 6.25 | 90 days supply | Qty: 180 | Fill #0

## 2020-04-22 MED FILL — ATORVASTATIN CALCIUM 20 MG: 20 | 90 days supply | Qty: 90 | Fill #0

## 2020-04-22 MED FILL — TORSEMIDE 100 MG TABLET: 100 | 90 days supply | Qty: 90 | Fill #0

## 2020-04-24 ENCOUNTER — Emergency Department (HOSPITAL_COMMUNITY): Payer: Medicare Other

## 2020-04-24 ENCOUNTER — Inpatient Hospital Stay (HOSPITAL_COMMUNITY)
Admission: EM | Admit: 2020-04-24 | Discharge: 2020-05-07 | DRG: 291 | Disposition: A | Discharge status: Skilled Nursing Facility | Payer: Medicare Other | Attending: Family Medicine | Admitting: Family Medicine

## 2020-04-24 ENCOUNTER — Other Ambulatory Visit: Payer: Self-pay

## 2020-04-24 ENCOUNTER — Encounter (HOSPITAL_COMMUNITY): Payer: Self-pay | Admitting: Emergency Medicine

## 2020-04-24 DIAGNOSIS — I130 Hypertensive heart and chronic kidney disease with heart failure and stage 1 through stage 4 chronic kidney disease, or unspecified chronic kidney disease: Principal | ICD-10-CM | POA: Diagnosis present

## 2020-04-24 DIAGNOSIS — Z6841 Body Mass Index (BMI) 40.0 and over, adult: Secondary | ICD-10-CM | POA: Diagnosis not present

## 2020-04-24 DIAGNOSIS — D860 Sarcoidosis of lung: Secondary | ICD-10-CM | POA: Diagnosis present

## 2020-04-24 DIAGNOSIS — Y929 Unspecified place or not applicable: Secondary | ICD-10-CM

## 2020-04-24 DIAGNOSIS — R112 Nausea with vomiting, unspecified: Secondary | ICD-10-CM

## 2020-04-24 DIAGNOSIS — I509 Heart failure, unspecified: Secondary | ICD-10-CM

## 2020-04-24 DIAGNOSIS — T502X5A Adverse effect of carbonic-anhydrase inhibitors, benzothiadiazides and other diuretics, initial encounter: Secondary | ICD-10-CM | POA: Diagnosis present

## 2020-04-24 DIAGNOSIS — E8809 Other disorders of plasma-protein metabolism, not elsewhere classified: Secondary | ICD-10-CM | POA: Diagnosis present

## 2020-04-24 DIAGNOSIS — L8990 Pressure ulcer of unspecified site, unspecified stage: Secondary | ICD-10-CM | POA: Insufficient documentation

## 2020-04-24 DIAGNOSIS — I5030 Unspecified diastolic (congestive) heart failure: Secondary | ICD-10-CM | POA: Diagnosis not present

## 2020-04-24 DIAGNOSIS — I5032 Chronic diastolic (congestive) heart failure: Secondary | ICD-10-CM | POA: Diagnosis not present

## 2020-04-24 DIAGNOSIS — I10 Essential (primary) hypertension: Secondary | ICD-10-CM | POA: Diagnosis not present

## 2020-04-24 DIAGNOSIS — R188 Other ascites: Secondary | ICD-10-CM | POA: Diagnosis present

## 2020-04-24 DIAGNOSIS — J9611 Chronic respiratory failure with hypoxia: Secondary | ICD-10-CM | POA: Diagnosis not present

## 2020-04-24 DIAGNOSIS — Z87898 Personal history of other specified conditions: Secondary | ICD-10-CM | POA: Diagnosis not present

## 2020-04-24 DIAGNOSIS — I169 Hypertensive crisis, unspecified: Secondary | ICD-10-CM | POA: Diagnosis present

## 2020-04-24 DIAGNOSIS — Z20822 Contact with and (suspected) exposure to covid-19: Secondary | ICD-10-CM | POA: Diagnosis present

## 2020-04-24 DIAGNOSIS — Z86718 Personal history of other venous thrombosis and embolism: Secondary | ICD-10-CM

## 2020-04-24 DIAGNOSIS — K7469 Other cirrhosis of liver: Secondary | ICD-10-CM | POA: Diagnosis not present

## 2020-04-24 DIAGNOSIS — E785 Hyperlipidemia, unspecified: Secondary | ICD-10-CM | POA: Diagnosis present

## 2020-04-24 DIAGNOSIS — K219 Gastro-esophageal reflux disease without esophagitis: Secondary | ICD-10-CM | POA: Diagnosis present

## 2020-04-24 DIAGNOSIS — Z882 Allergy status to sulfonamides status: Secondary | ICD-10-CM

## 2020-04-24 DIAGNOSIS — R601 Generalized edema: Secondary | ICD-10-CM

## 2020-04-24 DIAGNOSIS — E1122 Type 2 diabetes mellitus with diabetic chronic kidney disease: Secondary | ICD-10-CM | POA: Diagnosis present

## 2020-04-24 DIAGNOSIS — D649 Anemia, unspecified: Secondary | ICD-10-CM | POA: Diagnosis not present

## 2020-04-24 DIAGNOSIS — I5189 Other ill-defined heart diseases: Secondary | ICD-10-CM

## 2020-04-24 DIAGNOSIS — J9621 Acute and chronic respiratory failure with hypoxia: Secondary | ICD-10-CM | POA: Diagnosis present

## 2020-04-24 DIAGNOSIS — B9620 Unspecified Escherichia coli [E. coli] as the cause of diseases classified elsewhere: Secondary | ICD-10-CM | POA: Diagnosis present

## 2020-04-24 DIAGNOSIS — J189 Pneumonia, unspecified organism: Secondary | ICD-10-CM

## 2020-04-24 DIAGNOSIS — E11649 Type 2 diabetes mellitus with hypoglycemia without coma: Secondary | ICD-10-CM | POA: Diagnosis present

## 2020-04-24 DIAGNOSIS — N179 Acute kidney failure, unspecified: Secondary | ICD-10-CM | POA: Diagnosis not present

## 2020-04-24 DIAGNOSIS — D638 Anemia in other chronic diseases classified elsewhere: Secondary | ICD-10-CM | POA: Diagnosis present

## 2020-04-24 DIAGNOSIS — E875 Hyperkalemia: Secondary | ICD-10-CM | POA: Diagnosis present

## 2020-04-24 DIAGNOSIS — R234 Changes in skin texture: Secondary | ICD-10-CM | POA: Diagnosis present

## 2020-04-24 DIAGNOSIS — E876 Hypokalemia: Secondary | ICD-10-CM | POA: Diagnosis not present

## 2020-04-24 DIAGNOSIS — N390 Urinary tract infection, site not specified: Secondary | ICD-10-CM

## 2020-04-24 DIAGNOSIS — I5031 Acute diastolic (congestive) heart failure: Secondary | ICD-10-CM | POA: Diagnosis not present

## 2020-04-24 DIAGNOSIS — N63 Unspecified lump in unspecified breast: Secondary | ICD-10-CM | POA: Diagnosis present

## 2020-04-24 DIAGNOSIS — N1831 Chronic kidney disease, stage 3a: Secondary | ICD-10-CM | POA: Diagnosis present

## 2020-04-24 DIAGNOSIS — Z794 Long term (current) use of insulin: Secondary | ICD-10-CM

## 2020-04-24 DIAGNOSIS — I5033 Acute on chronic diastolic (congestive) heart failure: Secondary | ICD-10-CM | POA: Diagnosis present

## 2020-04-24 DIAGNOSIS — R0789 Other chest pain: Secondary | ICD-10-CM | POA: Diagnosis not present

## 2020-04-24 DIAGNOSIS — G40909 Epilepsy, unspecified, not intractable, without status epilepticus: Secondary | ICD-10-CM | POA: Diagnosis present

## 2020-04-24 DIAGNOSIS — F0390 Unspecified dementia without behavioral disturbance: Secondary | ICD-10-CM | POA: Diagnosis present

## 2020-04-24 DIAGNOSIS — R7989 Other specified abnormal findings of blood chemistry: Secondary | ICD-10-CM | POA: Diagnosis not present

## 2020-04-24 DIAGNOSIS — K7460 Unspecified cirrhosis of liver: Secondary | ICD-10-CM

## 2020-04-24 DIAGNOSIS — Z833 Family history of diabetes mellitus: Secondary | ICD-10-CM

## 2020-04-24 DIAGNOSIS — Z79899 Other long term (current) drug therapy: Secondary | ICD-10-CM

## 2020-04-24 DIAGNOSIS — H5461 Unqualified visual loss, right eye, normal vision left eye: Secondary | ICD-10-CM | POA: Diagnosis present

## 2020-04-24 DIAGNOSIS — Z87891 Personal history of nicotine dependence: Secondary | ICD-10-CM

## 2020-04-24 DIAGNOSIS — G4733 Obstructive sleep apnea (adult) (pediatric): Secondary | ICD-10-CM | POA: Diagnosis present

## 2020-04-24 DIAGNOSIS — E119 Type 2 diabetes mellitus without complications: Secondary | ICD-10-CM | POA: Diagnosis not present

## 2020-04-24 DIAGNOSIS — Z8249 Family history of ischemic heart disease and other diseases of the circulatory system: Secondary | ICD-10-CM

## 2020-04-24 DIAGNOSIS — K760 Fatty (change of) liver, not elsewhere classified: Secondary | ICD-10-CM | POA: Diagnosis present

## 2020-04-24 DIAGNOSIS — Z888 Allergy status to other drugs, medicaments and biological substances status: Secondary | ICD-10-CM

## 2020-04-24 DIAGNOSIS — L89151 Pressure ulcer of sacral region, stage 1: Secondary | ICD-10-CM | POA: Diagnosis present

## 2020-04-24 LAB — COMPREHENSIVE METABOLIC PANEL
ALT: 11 U/L (ref 0–44)
AST: 24 U/L (ref 15–41)
Albumin: 1.6 g/dL — ABNORMAL LOW (ref 3.5–5.0)
Alkaline Phosphatase: 76 U/L (ref 38–126)
Anion gap: 9 (ref 5–15)
BUN: 21 mg/dL — ABNORMAL HIGH (ref 6–20)
CO2: 28 mmol/L (ref 22–32)
Calcium: 7.5 mg/dL — ABNORMAL LOW (ref 8.9–10.3)
Chloride: 102 mmol/L (ref 98–111)
Creatinine, Ser: 1.34 mg/dL — ABNORMAL HIGH (ref 0.44–1.00)
GFR calc Af Amer: 51 mL/min — ABNORMAL LOW (ref >60)
GFR calc non Af Amer: 44 mL/min — ABNORMAL LOW (ref >60)
Glucose, Bld: 113 mg/dL — ABNORMAL HIGH (ref 70–99)
Potassium: 3.9 mmol/L (ref 3.5–5.1)
Sodium: 139 mmol/L (ref 135–145)
Total Bilirubin: 0.8 mg/dL (ref 0.3–1.2)
Total Protein: 6.6 g/dL (ref 6.5–8.1)

## 2020-04-24 LAB — CBC WITH DIFFERENTIAL/PLATELET
Abs Immature Granulocytes: 0.12 10*3/uL — ABNORMAL HIGH (ref 0.00–0.07)
Basophils Absolute: 0 10*3/uL (ref 0.0–0.1)
Basophils Relative: 0 %
Eosinophils Absolute: 0 10*3/uL (ref 0.0–0.5)
Eosinophils Relative: 0 %
HCT: 29 % — ABNORMAL LOW (ref 36.0–46.0)
Hemoglobin: 9.5 g/dL — ABNORMAL LOW (ref 12.0–15.0)
Immature Granulocytes: 1 %
Lymphocytes Relative: 4 %
Lymphs Abs: 0.6 10*3/uL — ABNORMAL LOW (ref 0.7–4.0)
MCH: 30.4 pg (ref 26.0–34.0)
MCHC: 32.8 g/dL (ref 30.0–36.0)
MCV: 92.9 fL (ref 80.0–100.0)
Monocytes Absolute: 1.1 10*3/uL — ABNORMAL HIGH (ref 0.1–1.0)
Monocytes Relative: 7 %
Neutro Abs: 13.6 10*3/uL — ABNORMAL HIGH (ref 1.7–7.7)
Neutrophils Relative %: 88 %
Platelets: 236 10*3/uL (ref 150–400)
RBC: 3.12 MIL/uL — ABNORMAL LOW (ref 3.87–5.11)
RDW: 13.6 % (ref 11.5–15.5)
WBC: 15.5 10*3/uL — ABNORMAL HIGH (ref 4.0–10.5)
nRBC: 0 % (ref 0.0–0.2)

## 2020-04-24 LAB — TYPE AND SCREEN
ABO/RH(D): B POS
Antibody Screen: NEGATIVE

## 2020-04-24 LAB — URINALYSIS, ROUTINE W REFLEX MICROSCOPIC
Bilirubin Urine: NEGATIVE
Glucose, UA: NEGATIVE mg/dL
Ketones, ur: NEGATIVE mg/dL
Nitrite: NEGATIVE
Protein, ur: >=300 mg/dL — AB
Specific Gravity, Urine: 1.017 (ref 1.005–1.030)
pH: 5 (ref 5.0–8.0)

## 2020-04-24 LAB — SARS CORONAVIRUS 2 BY RT PCR (HOSPITAL ORDER, PERFORMED IN ~~LOC~~ HOSPITAL LAB): SARS Coronavirus 2: NEGATIVE

## 2020-04-24 LAB — BRAIN NATRIURETIC PEPTIDE: B Natriuretic Peptide: 264.5 pg/mL — ABNORMAL HIGH (ref 0.0–100.0)

## 2020-04-24 LAB — LACTIC ACID, PLASMA: Lactic Acid, Venous: 1.1 mmol/L (ref 0.5–1.9)

## 2020-04-24 LAB — GLUCOSE, CAPILLARY
Glucose-Capillary: 117 mg/dL — ABNORMAL HIGH (ref 70–99)
Glucose-Capillary: 97 mg/dL (ref 70–99)

## 2020-04-24 LAB — PROTIME-INR
INR: 1.2 (ref 0.8–1.2)
Prothrombin Time: 14.9 seconds (ref 11.4–15.2)

## 2020-04-24 LAB — AMMONIA: Ammonia: 38 umol/L — ABNORMAL HIGH (ref 9–35)

## 2020-04-24 LAB — POC OCCULT BLOOD, ED: Fecal Occult Bld: NEGATIVE

## 2020-04-24 MED ORDER — PROMETHAZINE HCL 25 MG/ML IJ SOLN
12.5000 mg | Freq: Once | INTRAMUSCULAR | Status: AC
  Administered 2020-04-24: 12.5 mg via INTRAVENOUS
  Filled 2020-04-24: qty 1

## 2020-04-24 MED ORDER — FLUOXETINE HCL 10 MG PO CAPS
10.0000 mg | ORAL_CAPSULE | Freq: Every day | ORAL | Status: DC
  Administered 2020-04-25 – 2020-05-07 (×13): 10 mg via ORAL
  Filled 2020-04-24 (×13): qty 1

## 2020-04-24 MED ORDER — ATORVASTATIN CALCIUM 10 MG PO TABS
20.0000 mg | ORAL_TABLET | Freq: Every day | ORAL | Status: DC
  Administered 2020-04-24 – 2020-05-07 (×14): 20 mg via ORAL
  Filled 2020-04-24 (×14): qty 2

## 2020-04-24 MED ORDER — LABETALOL HCL 5 MG/ML IV SOLN
10.0000 mg | Freq: Once | INTRAVENOUS | Status: AC
  Administered 2020-04-24: 10 mg via INTRAVENOUS
  Filled 2020-04-24: qty 4

## 2020-04-24 MED ORDER — ENOXAPARIN SODIUM 60 MG/0.6ML ~~LOC~~ SOLN
60.0000 mg | SUBCUTANEOUS | Status: DC
  Administered 2020-04-24 – 2020-05-06 (×13): 60 mg via SUBCUTANEOUS
  Filled 2020-04-24 (×13): qty 0.6

## 2020-04-24 MED ORDER — LACOSAMIDE 50 MG PO TABS
200.0000 mg | ORAL_TABLET | Freq: Two times a day (BID) | ORAL | Status: DC
  Administered 2020-04-24 – 2020-05-07 (×26): 200 mg via ORAL
  Filled 2020-04-24 (×26): qty 4

## 2020-04-24 MED ORDER — ONDANSETRON HCL 4 MG/2ML IJ SOLN
4.0000 mg | Freq: Four times a day (QID) | INTRAMUSCULAR | Status: DC | PRN
  Administered 2020-04-24 – 2020-04-25 (×3): 4 mg via INTRAVENOUS
  Filled 2020-04-24 (×5): qty 2

## 2020-04-24 MED ORDER — LOSARTAN POTASSIUM 50 MG PO TABS
50.0000 mg | ORAL_TABLET | Freq: Every day | ORAL | Status: DC
  Administered 2020-04-24 – 2020-05-01 (×8): 50 mg via ORAL
  Filled 2020-04-24 (×8): qty 1

## 2020-04-24 MED ORDER — FUROSEMIDE 10 MG/ML IJ SOLN
40.0000 mg | Freq: Once | INTRAMUSCULAR | Status: AC
  Administered 2020-04-24: 40 mg via INTRAVENOUS
  Filled 2020-04-24: qty 4

## 2020-04-24 MED ORDER — ACETAMINOPHEN 325 MG PO TABS
650.0000 mg | ORAL_TABLET | Freq: Four times a day (QID) | ORAL | Status: DC | PRN
  Administered 2020-04-25: 650 mg via ORAL
  Filled 2020-04-24: qty 2

## 2020-04-24 MED ORDER — INSULIN ASPART 100 UNIT/ML ~~LOC~~ SOLN: 0.0000 [IU] | Freq: Three times a day (TID) | SUBCUTANEOUS | Status: DC

## 2020-04-24 MED ORDER — ACETAMINOPHEN 650 MG RE SUPP: 650.0000 mg | Freq: Four times a day (QID) | RECTAL | Status: DC | PRN

## 2020-04-24 MED ORDER — CARVEDILOL 3.125 MG PO TABS
6.2500 mg | ORAL_TABLET | Freq: Two times a day (BID) | ORAL | Status: DC
  Administered 2020-04-25 – 2020-05-07 (×24): 6.25 mg via ORAL
  Filled 2020-04-24 (×24): qty 2

## 2020-04-24 MED ORDER — HYDROCODONE-ACETAMINOPHEN 10-325 MG PO TABS
1.0000 | ORAL_TABLET | Freq: Four times a day (QID) | ORAL | Status: DC | PRN
  Administered 2020-04-24 – 2020-05-07 (×38): 1 via ORAL
  Filled 2020-04-24 (×38): qty 1

## 2020-04-24 MED ORDER — FUROSEMIDE 10 MG/ML IJ SOLN
40.0000 mg | Freq: Two times a day (BID) | INTRAMUSCULAR | Status: DC
  Administered 2020-04-24 – 2020-04-25 (×2): 40 mg via INTRAVENOUS
  Filled 2020-04-24 (×2): qty 4

## 2020-04-24 MED ORDER — SODIUM CHLORIDE 0.9 % IV SOLN
1.0000 g | INTRAVENOUS | Status: DC
  Administered 2020-04-25 – 2020-04-26 (×2): 1 g via INTRAVENOUS
  Filled 2020-04-24: qty 1
  Filled 2020-04-24 (×2): qty 10

## 2020-04-24 MED ORDER — HYDRALAZINE HCL 20 MG/ML IJ SOLN
5.0000 mg | INTRAMUSCULAR | Status: DC | PRN
  Administered 2020-04-24 – 2020-04-29 (×5): 5 mg via INTRAVENOUS
  Filled 2020-04-24 (×5): qty 1

## 2020-04-24 MED ORDER — SODIUM CHLORIDE 0.9 % IV SOLN
1.0000 g | Freq: Once | INTRAVENOUS | Status: AC
  Administered 2020-04-24: 1 g via INTRAVENOUS
  Filled 2020-04-24: qty 10

## 2020-04-24 MED ORDER — INSULIN ASPART 100 UNIT/ML ~~LOC~~ SOLN: 0.0000 [IU] | Freq: Every day | SUBCUTANEOUS | Status: DC

## 2020-04-24 NOTE — ED Notes (Signed)
X-ray at bedside

## 2020-04-24 NOTE — ED Triage Notes (Signed)
Per [facility] pt primarily bed bound at home with transition to [facility]. Pt having fatigue/weakness and bilat leg swelling for 2 days with n/v and coffee ground emesis.  Vitals: 190/80, CBG 104, 96-100% on 4L that is normally on. 90HR.  IV 20g in right AC given Zofran 4mg .

## 2020-04-24 NOTE — ED Notes (Signed)
Pt placed on purewick at 50 mmHg. 

## 2020-04-24 NOTE — H&P (Signed)
History and Physical    Marissa Wilkerson ERD:[id number] DOB: [birth year] DOA: 04/24/2020  PCP: Jani Gravel, MD  Patient coming from: Home  Chief Complaint: swelling  HPI: Marissa Wilkerson is a 59 y.o. female with medical history significant of diabetes, chronic diastolic heart failure, sarcoid of the lung who presents to the emergency department with complaints of increasing weakness with worsening swelling primarily in the lower extremities with nausea.  On further questioning, patient has been without torsemide for several weeks prior to hospital visit with progressive swelling.  Patient's husband is at bedside who states patient's dry weight is around 240 pounds.  Patient also reports not taking her other medications, notably her blood pressure medications, as directed during this time as well.  ED Course: In the emergency department, patient patient was noted to have a urinalysis suggestive of urinary tract infection with a white blood count in excess of 15,000.  Patient also noted to have a BNP of 264.5 with chest x-ray reviewed, demonstrating evidence of volume overload and pulmonary edema.  Patient was given 1 dose of IV Lasix in the emergency department.  Patient was also noted to have systolic blood pressure in excess of 200, which was ultimately treated with 1 dose of IV labetalol by the ED.  Given concerns for UTI with acute heart failure, hospitalist service consulted for consideration for medical admission  Review of Systems:  Review of Systems  Constitutional: Positive for malaise/fatigue. Negative for chills, fever and weight loss.  HENT: Negative for ear pain and nosebleeds.   Eyes: Negative for double vision, photophobia and pain.  Respiratory: Positive for shortness of breath. Negative for hemoptysis and wheezing.   Cardiovascular: Positive for leg swelling. Negative for palpitations, orthopnea and claudication.  Gastrointestinal: Positive for nausea and vomiting. Negative for  constipation and diarrhea.  Genitourinary: Negative for flank pain and frequency.  Musculoskeletal: Negative for back pain, joint pain and neck pain.  Neurological: Negative for sensory change, speech change, seizures and loss of consciousness.  Psychiatric/Behavioral: Negative for hallucinations and memory loss. The patient does not have insomnia.     Past Medical History:  Diagnosis Date  . Anemia    05/04/15 hemglobin 8.8  . Arthritis   . Diabetes mellitus without complication (HCC)    insulin dependent, po meds  . Diastolic dysfunction    followed by dr polite found on echo 06-12-13 , grade 1 per pt  . Dyslipidemia   . Edema of extremities    PITTING EDEMA     . GERD (gastroesophageal reflux disease)   . Hypertension   . 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 Shortness of breath    ON EXERTION , O2 2-3 l/min  . 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 Vertigo, benign positional   . Wheezing    OCC    Past Surgical History:  Procedure Laterality Date  . all teeth extraction  7-8 yrs ago  . CHOLECYSTECTOMY  1996  . COLONOSCOPY WITH PROPOFOL N/A 08/05/2013   Procedure: COLONOSCOPY WITH PROPOFOL;  Surgeon: Garlan Fair, MD;  Location: [facility];  Service: Endoscopy;  Laterality: N/A;  . ESOPHAGOGASTRODUODENOSCOPY (EGD) WITH PROPOFOL N/A 05/31/2015   Procedure: ESOPHAGOGASTRODUODENOSCOPY (EGD) WITH PROPOFOL;  Surgeon: Garlan Fair, MD;  Location: [facility];  Service: Endoscopy;  Laterality: N/A;  . LUNG BIOPSY Left 03/05/2014   Procedure: LUNG BIOPSY;  Surgeon: Gaye Pollack, MD;  Location: [facility] OR;  Service: Thoracic;  Laterality: Left;  [name] VIDEO ASSISTED THORACOSCOPY Left 03/05/2014   Procedure: VIDEO ASSISTED THORACOSCOPY;  Surgeon: Gaye Pollack, MD;  Location: [facility];  Service: Thoracic;  Laterality: Left;  [name] VIDEO BRONCHOSCOPY Bilateral 09/04/2013   Procedure: VIDEO BRONCHOSCOPY WITH FLUORO;  Surgeon: Collene Gobble, MD;  Location: [facility];  Service: Cardiopulmonary;  Laterality: Bilateral;  . WISDOM TOOTH EXTRACTION  yrs ago     reports that she quit smoking about 7 years ago. Her smoking use included cigarettes. She has a 12.50 pack-year smoking history. She has never used smokeless tobacco. She reports current alcohol use. She reports that she does not use drugs.  Allergies  Allergen Reactions  . Cymbalta [Duloxetine Hcl] Other (See Comments)    Hallucinations   . Fentanyl Other (See Comments)    Patches. Blistered skin.   . Metformin And Related Nausea And Vomiting  . Mobic [Meloxicam] Nausea And Vomiting  . Sulfa Antibiotics Rash  . Voltaren [Diclofenac] Nausea And Vomiting    Family History  Problem Relation Age of Onset  . Heart failure Mother   . Diabetes Father   . Hypertension Father   . Diabetes Brother   . Hypertension Brother     Prior to Admission medications   Medication Sig Start Date End Date Taking? Authorizing Provider  albuterol (PROVENTIL HFA;VENTOLIN HFA) 108 (90 Base) MCG/ACT inhaler Inhale 2 puffs into the lungs every 6 (six) hours as needed for wheezing or shortness of breath. 11/02/17  Yes Collene Gobble, MD  atorvastatin (LIPITOR) 20 MG tablet Take 1 tablet (20 mg total) by mouth daily. 09/28/17  Yes Georgette Shell, MD  carvedilol (COREG) 6.25 MG tablet Take 6.25 mg by mouth 2 (two) times daily. 04/22/20  Yes [provider]  donepezil (ARICEPT) 10 MG tablet Take 10 mg by mouth daily. 11/24/19  Yes [provider]  FLUoxetine (PROZAC) 10 MG capsule Take 10 mg by mouth daily. 12/16/19  Yes [provider]  folic acid (FOLVITE) 1 MG tablet Take 1 tablet (1 mg total) by mouth daily. 01/09/20 01/08/21 Yes Florencia Reasons, MD  HYDROcodone-acetaminophen [facility]) 10-325 MG tablet Take 1 tablet by mouth every 6 (six) hours as needed for moderate pain or severe pain.    Yes [provider]  insulin lispro (HUMALOG) 100 UNIT/ML injection  Before each meal 3 times a day, 140-199 - 2 units, 200-250 - 4 units, 251-299 - 6 units,  300-349 - 8 units,  350 or above 10 units. Insulin PEN if approved, provide syringes and needles if needed. Patient taking differently: Inject 10-30 Units into the skin 3 (three) times daily with meals. 10 units + correction up to 30 units 01/09/20  Yes Florencia Reasons, MD  lacosamide (VIMPAT) 200 MG TABS tablet Take 200 mg by mouth 2 (two) times daily.  02/08/18  Yes [provider]  losartan (COZAAR) 50 MG tablet Take 50 mg by mouth daily. 02/27/20  Yes [provider]  metoCLOPramide (REGLAN) 5 MG tablet Take 5-10 mg by mouth in the morning and at bedtime. Take 2 tablets (10 mg) in the morning and Take 1 tablet (5 mg) in the evening   Yes [provider]  metolazone (ZAROXOLYN) 10 MG tablet Take 10 mg by mouth daily as needed (fluid).    Yes [provider]  promethazine (PHENERGAN) 25 MG tablet Take 25 mg by mouth every 6 (six) hours as needed for nausea or vomiting.  12/04/19  Yes [provider]  tiZANidine (ZANAFLEX) 4 MG tablet Take 1 tablet (4 mg total) by mouth every 8 (eight) hours as needed for muscle spasms. 09/28/17  Yes Georgette Shell, MD  torsemide (DEMADEX) 100 MG tablet Take 100 mg by mouth daily as needed (fluid).    Yes [provider]  vitamin B-12 (CYANOCOBALAMIN) 1000 MCG tablet Take 1 tablet (1,000 mcg total) by mouth daily. 01/09/20  Yes Florencia Reasons, MD  carvedilol (COREG) 12.5 MG tablet Take 1 tablet (12.5 mg total) by mouth 2 (two) times daily with a meal. Patient not taking: Reported on 04/24/2020 01/09/20   Florencia Reasons, MD  fluticasone [facility]) 50 MCG/ACT nasal spray Place 1 spray into both nostrils daily. Patient not taking: Reported on 04/24/2020 09/29/17   Georgette Shell, MD  losartan (COZAAR) 25 MG tablet Take 1 tablet (25 mg total) by mouth daily. Patient not taking: Reported on 04/24/2020 01/09/20 02/08/20  Florencia Reasons, MD    Physical  Exam: Vitals:   04/24/20 1530 04/24/20 1600 04/24/20 1630 04/24/20 1650  BP: (!) 202/72 (!) 182/62 (!) 199/100 (!) 199/100  Pulse:   98 96  Resp: (!) 24 17 (!) 26 18  Temp:      TempSrc:      SpO2:   100% 100%    Constitutional: NAD, calm, comfortable Vitals:   04/24/20 1530 04/24/20 1600 04/24/20 1630 04/24/20 1650  BP: (!) 202/72 (!) 182/62 (!) 199/100 (!) 199/100  Pulse:   98 96  Resp: (!) 24 17 (!) 26 18  Temp:      TempSrc:      SpO2:   100% 100%   Eyes: PERRL, lids and conjunctivae normal ENMT: Mucous membranes are moist. Posterior pharynx clear of any exudate or lesions.Normal dentition.  Neck: normal, supple, no masses, no thyromegaly Respiratory: Decreased breath sounds throughout, increased respiratory effort Cardiovascular: Regular rate and rhythm, S1, S2 Abdomen: no tenderness, no masses palpated. No hepatosplenomegaly. Bowel sounds positive.  Musculoskeletal: no clubbing / cyanosis. No joint deformity upper and lower extremities. Good ROM, no contractures. Normal muscle tone.  2-3+ pitting edema bilateral extremities above the knees Skin: no rashes, lesions, ulcers. No induration Neurologic: CN 2-12 grossly intact. Sensation intact, no tremor strength 5/5 in all 4.  Psychiatric: Normal judgment and insight. Alert and oriented x 3. Normal mood.    Labs on Admission: I have personally reviewed following labs and imaging studies  CBC: Recent Labs  Lab 04/24/20 1349  WBC 15.5*  NEUTROABS 13.6*  HGB 9.5*  HCT 29.0*  MCV 92.9  PLT 026   Basic Metabolic Panel: Recent Labs  Lab 04/24/20 1349  NA 139  K 3.9  CL 102  CO2 28  GLUCOSE 113*  BUN 21*  CREATININE 1.34*  CALCIUM 7.5*   GFR: CrCl cannot be calculated (Unknown ideal weight.). Liver Function Tests: Recent Labs  Lab 04/24/20 1349  AST 24  ALT 11  ALKPHOS 76  BILITOT 0.8  PROT 6.6  ALBUMIN 1.6*   No results for input(s): LIPASE, AMYLASE in the last 168 hours. Recent Labs  Lab  04/24/20 1349  AMMONIA 38*   Coagulation Profile: Recent Labs  Lab 04/24/20 1349  INR 1.2   Cardiac Enzymes: No results for input(s): CKTOTAL, CKMB, CKMBINDEX, TROPONINI in the last 168 hours. BNP (last 3 results) No results for input(s): PROBNP in the last 8760 hours. HbA1C: No results for input(s): HGBA1C in the last 72 hours. CBG: No results for  input(s): GLUCAP in the last 168 hours. Lipid Profile: No results for input(s): CHOL, HDL, LDLCALC, TRIG, CHOLHDL, LDLDIRECT in the last 72 hours. Thyroid Function Tests: No results for input(s): TSH, T4TOTAL, FREET4, T3FREE, THYROIDAB in the last 72 hours. Anemia Panel: No results for input(s): VITAMINB12, FOLATE, FERRITIN, TIBC, IRON, RETICCTPCT in the last 72 hours. Urine analysis:    Component Value Date/Time   COLORURINE AMBER (A) 04/24/2020 1407   APPEARANCEUR CLOUDY (A) 04/24/2020 1407   LABSPEC 1.017 04/24/2020 1407   PHURINE 5.0 04/24/2020 1407   GLUCOSEU NEGATIVE 04/24/2020 1407   HGBUR MODERATE (A) 04/24/2020 1407   BILIRUBINUR NEGATIVE 04/24/2020 1407   KETONESUR NEGATIVE 04/24/2020 1407   PROTEINUR >=300 (A) 04/24/2020 1407   UROBILINOGEN 1.0 03/03/2014 0942   NITRITE NEGATIVE 04/24/2020 1407   LEUKOCYTESUR MODERATE (A) 04/24/2020 1407   Sepsis Labs: !!!!!!!!!!!!!!!!!!!!!!!!!!!!!!!!!!!!!!!!!!!! @LABRCNTIP (procalcitonin:4,lacticidven:4) ) Recent Results (from the past 240 hour(s))  SARS Coronavirus 2 by RT PCR (hospital order, performed in [place] hospital lab) Nasopharyngeal Nasopharyngeal Swab     Status: None   Collection Time: 04/24/20  3:33 PM   Specimen: Nasopharyngeal Swab  Result Value Ref Range Status   SARS Coronavirus 2 NEGATIVE NEGATIVE Final    Comment: (NOTE) SARS-CoV-2 target nucleic acids are NOT DETECTED.  The SARS-CoV-2 RNA is generally detectable in upper and lower respiratory specimens during the acute phase of infection. The lowest concentration of SARS-CoV-2 viral copies this assay  can detect is 250 copies / mL. A negative result does not preclude SARS-CoV-2 infection and should not be used as the sole basis for treatment or other patient management decisions.  A negative result may occur with improper specimen collection / handling, submission of specimen other than nasopharyngeal swab, presence of viral mutation(s) within the areas targeted by this assay, and inadequate number of viral copies (<250 copies / mL). A negative result must be combined with clinical observations, patient history, and epidemiological information.  Fact Sheet for Patients:   StrictlyIdeas.no  Fact Sheet for Healthcare Providers: BankingDealers.co.za  This test is not yet approved or  cleared by the [place] FDA and has been authorized for detection and/or diagnosis of SARS-CoV-2 by FDA under an Emergency Use Authorization (EUA).  This EUA will remain in effect (meaning this test can be used) for the duration of the COVID-19 declaration under Section 564(b)(1) of the Act, 21 U.S.C. section 360bbb-3(b)(1), unless the authorization is terminated or revoked sooner.  Performed at [facility], [place] [address], [place], [place] [address]      Radiological Exams on Admission: DG Chest Portable 1 View  Result Date: 04/24/2020 CLINICAL DATA:  Fatigue/weakness and bilateral leg swelling for 2 days. EXAM: PORTABLE CHEST 1 VIEW COMPARISON:  Chest radiograph dated 12/06/2017 FINDINGS: The heart remains enlarged. The left costophrenic angle is obscured and may reflect a pleural effusion with associated atelectasis/airspace disease. There is a mild diffuse bilateral interstitial opacities which likely represent pulmonary edema. There is no right pleural effusion. There is no pneumothorax. The osseous structures appear intact. IMPRESSION: 1. Mild diffuse bilateral interstitial opacities which likely represent pulmonary edema. 2.  Possible left pleural effusion with associated atelectasis/airspace disease. 3. Cardiomegaly. Electronically Signed   By: Zerita Boers M.D.   On: 04/24/2020 14:35    EKG: Independently reviewed. NSR  Assessment/Plan Principal Problem:   CHF exacerbation (HCC) Active Problems:   Hypertension   Diabetes mellitus without complication ([place])   Diastolic dysfunction   Sarcoidosis of lung ([place])  Hyperlipidemia   CHF (congestive heart failure), NYHA class I, unspecified failure chronicity, diastolic (HCC)   History of seizure   1. Acute on chronic diastolic congestive heart failure 1. Presents with gross volume overload and anasarca on exam 2. BNP elevated at 264.5 3. Recent 2D echocardiogram from 2019 reviewed.  Findings of LVH with preserved LVEF 4. Family reports dry weight of around 240 pounds 5. Continue patient with 40 mg IV Lasix twice daily 6. Monitor ins and outs closely 7. Follow basic metabolic panel trends 2. Hypertensive crisis 1. Presenting systolic blood pressure in excess of 200 2. He 1 dose of labetalol was given by EDP with little effect 3. Would resume home blood pressure medications as tolerated 4. We will add IV hydralazine as needed for systolic blood pressure greater than 160 5. If blood pressure improves difficult to control despite as needed medications, can consider addition of IV nitroglycerin gtt 3. Diabetes 1. Random blood sugar stable at 113 2. Continue sliding scale insulin as tolerated 4. Sarcoidosis of the lung 1. Seems to be stable at this point in time. 2. Chest x-ray personally reviewed, findings consistent with volume overload/pulmonary edema and cardiomegaly 5. Hyperlipidemia 1. We will continue statin per home regimen 2. Appears to be stable 6. Prior history of seizures 1. Appears to be stable at this time 2. Continue Vimpat per home regimen 7. UTI 1. Patient presents with leukocytosis and urinalysis suggestive of urinary tract  infection 2. Tachypnea noted, however suspect secondary to pulmonary edema in light of heart failure 3. Rocephin started in the emergency department, will continue.  Follow cultures  DVT prophylaxis: Lovenox subq  Code Status: Full Family Communication: Pt in room  Disposition Plan:   Consults called:  Admission status: Inpatient as will likely require greater than 2 midnight stay to fully diurese patient in light of acute diastolic heart failure that is requiring IV Lasix  Marylu Lund MD [facility] Pager On Amion  If 7PM-7AM, please contact night-coverage  04/24/2020, 5:09 PM

## 2020-04-24 NOTE — ED Notes (Signed)
Called [facility] to inform that patient Covid was negative and to find out how long for bed assignment. Was told by Charge RN that she would approve the pt's bed request.

## 2020-04-24 NOTE — ED Notes (Signed)
ED Provider at bedside. 

## 2020-04-24 NOTE — ED Provider Notes (Signed)
[facility] DEPT Provider Note   CSN: [id number] Arrival date & time: 04/24/20  1006     History Chief Complaint  Patient presents with  . Leg Swelling  . Weakness  . Emesis    Marissa Wilkerson is a 59 y.o. female.  HPI     59yo female with history of DM, sarcoidosis on 4L via [place], seizure, myoclonus, cognitive problems/dementia, CHF who presents with nausea, generalized weakness, leg swelling.  Reports leg swelling has been progressively worsening over the last week.  Yesterday, developed nausea and vomiting.  Has been reports that she has been vomiting approximately 5 times per day.  Reports she has had loose stool, but denies black or bloody stools.  Does report that she had some dark-colored flecks in her emesis.  Reports she has swelling that significant extending from her legs up to her abdomen, and has never had severe swelling like this before.  Reports that her blood pressures have been well controlled with blood pressures in the 120s as recently as yesterday, but today she has been and unable to take her blood pressure medication due to nausea and vomiting.  Reports severe generalized weakness.  No other concerns of shortness of breath, cough, abdominal pain, fevers, chest pain.  Denies history of falls.  No focal numbness or weakness.  Reports she has had some confusion from her baseline. Had first vaccine for COVID weeks ago.  Hx limited by pt, husband providing much of hx   Past Medical History:  Diagnosis Date  . Anemia    05/04/15 hemglobin 8.8  . Arthritis   . Diabetes mellitus without complication (HCC)    insulin dependent, po meds  . Diastolic dysfunction    followed by dr polite found on echo 06-12-13 , grade 1 per pt  . Dyslipidemia   . Edema of extremities    PITTING EDEMA     . GERD (gastroesophageal reflux disease)   . Hypertension   . 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 Shortness of breath    ON  EXERTION , O2 2-3 l/min  . 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 Vertigo, benign positional   . Wheezing    OCC    Patient Active Problem List   Diagnosis Date Noted  . CHF exacerbation ([place]) 04/24/2020  . Weakness 01/05/2020  . History of seizure 01/05/2020  . Snoring 04/12/2018  . Chronic rhinitis 04/12/2018  . Nausea and vomiting 10/17/2017  . AKI (acute kidney injury) ([place]) 10/16/2017  . CHF (congestive heart failure), NYHA class I, unspecified failure chronicity, diastolic ([place]) 19/14/7829  . Hyperkalemia 09/15/2017  . CHF (congestive heart failure) ([place]) 09/15/2017  . Hyperlipidemia 08/08/2016  . Bilateral carotid artery disease ([place]) 08/08/2016  . Lower extremity weakness 02/22/2016  . Multiple lung nodules 03/05/2014  . Obstructive lung disease ([place]) 02/09/2014  . Chronic respiratory failure ([place]) 09/02/2013  . Sarcoidosis of lung ([place]) 09/02/2013  . Hypertension   . Diabetes mellitus without complication ([place])   . Diastolic dysfunction     Past Surgical History:  Procedure Laterality Date  . all teeth extraction  7-8 yrs ago  . CHOLECYSTECTOMY  1996  . COLONOSCOPY WITH PROPOFOL N/A 08/05/2013   Procedure: COLONOSCOPY WITH PROPOFOL;  Surgeon: Garlan Fair, MD;  Location: [facility];  Service: Endoscopy;  Laterality: N/A;  . ESOPHAGOGASTRODUODENOSCOPY (EGD) WITH PROPOFOL N/A 05/31/2015   Procedure: ESOPHAGOGASTRODUODENOSCOPY (  EGD) WITH PROPOFOL;  Surgeon: Garlan Fair, MD;  Location: [facility];  Service: Endoscopy;  Laterality: N/A;  . LUNG BIOPSY Left 03/05/2014   Procedure: LUNG BIOPSY;  Surgeon: Gaye Pollack, MD;  Location: [facility];  Service: Thoracic;  Laterality: Left;  [name] VIDEO ASSISTED THORACOSCOPY Left 03/05/2014   Procedure: VIDEO ASSISTED THORACOSCOPY;  Surgeon: Gaye Pollack, MD;  Location: [facility];  Service: Thoracic;  Laterality: Left;  [name] VIDEO BRONCHOSCOPY Bilateral 09/04/2013   Procedure: VIDEO BRONCHOSCOPY  WITH FLUORO;  Surgeon: Collene Gobble, MD;  Location: [facility];  Service: Cardiopulmonary;  Laterality: Bilateral;  . WISDOM TOOTH EXTRACTION  yrs ago     OB History   No obstetric history on file.     Family History  Problem Relation Age of Onset  . Heart failure Mother   . Diabetes Father   . Hypertension Father   . Diabetes Brother   . Hypertension Brother     Social History   Tobacco Use  . Smoking status: Former Smoker    Packs/day: 0.50    Years: 25.00    Pack years: 12.50    Types: Cigarettes    Quit date: 08/21/2012    Years since quitting: 7.6  . Smokeless tobacco: Never Used  Substance Use Topics  . Alcohol use: Yes    Comment: very rare  . Drug use: No    Home Medications Prior to Admission medications   Medication Sig Start Date End Date Taking? Authorizing Provider  albuterol (PROVENTIL HFA;VENTOLIN HFA) 108 (90 Base) MCG/ACT inhaler Inhale 2 puffs into the lungs every 6 (six) hours as needed for wheezing or shortness of breath. 11/02/17  Yes Collene Gobble, MD  atorvastatin (LIPITOR) 20 MG tablet Take 1 tablet (20 mg total) by mouth daily. 09/28/17  Yes Georgette Shell, MD  carvedilol (COREG) 6.25 MG tablet Take 6.25 mg by mouth 2 (two) times daily. 04/22/20  Yes [provider]  diphenhydrAMINE (BENADRYL) 50 MG tablet Take 50 mg by mouth as needed for itching, allergies or sleep.   Yes [provider]  donepezil (ARICEPT) 10 MG tablet Take 10 mg by mouth daily. 11/24/19  Yes [provider]  FLUoxetine (PROZAC) 10 MG capsule Take 10 mg by mouth daily. 12/16/19  Yes [provider]  folic acid (FOLVITE) 1 MG tablet Take 1 tablet (1 mg total) by mouth daily. 01/09/20 01/08/21 Yes Florencia Reasons, MD  HYDROcodone-acetaminophen [facility]) 10-325 MG tablet Take 1 tablet by mouth every 6 (six) hours as needed for moderate pain or severe pain.    Yes [provider]  insulin lispro (HUMALOG) 100 UNIT/ML injection Before each  meal 3 times a day, 140-199 - 2 units, 200-250 - 4 units, 251-299 - 6 units,  300-349 - 8 units,  350 or above 10 units. Insulin PEN if approved, provide syringes and needles if needed. Patient taking differently: Inject 10-30 Units into the skin 3 (three) times daily with meals. 10 units + correction up to 30 units 01/09/20  Yes Florencia Reasons, MD  lacosamide (VIMPAT) 200 MG TABS tablet Take 200 mg by mouth 2 (two) times daily.  02/08/18  Yes [provider]  losartan (COZAAR) 50 MG tablet Take 50 mg by mouth daily. 02/27/20  Yes [provider]  metoCLOPramide (REGLAN) 5 MG tablet Take 5-10 mg by mouth in the morning and at bedtime. Take 2 tablets (10 mg) in the morning and Take 1 tablet (5 mg) in the  evening   Yes [provider]  metolazone (ZAROXOLYN) 2.5 MG tablet Take 2.5 mg by mouth daily as needed (fluid).    Yes [provider]  promethazine (PHENERGAN) 25 MG tablet Take 25 mg by mouth every 6 (six) hours as needed for nausea or vomiting.  12/04/19  Yes [provider]  tiZANidine (ZANAFLEX) 4 MG tablet Take 1 tablet (4 mg total) by mouth every 8 (eight) hours as needed for muscle spasms. 09/28/17  Yes Georgette Shell, MD  torsemide (DEMADEX) 100 MG tablet Take 100 mg by mouth daily as needed (fluid).    Yes [provider]  vitamin B-12 (CYANOCOBALAMIN) 1000 MCG tablet Take 1 tablet (1,000 mcg total) by mouth daily. 01/09/20  Yes Florencia Reasons, MD  carvedilol (COREG) 12.5 MG tablet Take 1 tablet (12.5 mg total) by mouth 2 (two) times daily with a meal. Patient not taking: Reported on 04/24/2020 01/09/20   Florencia Reasons, MD  fluticasone [facility]) 50 MCG/ACT nasal spray Place 1 spray into both nostrils daily. Patient not taking: Reported on 04/24/2020 09/29/17   Georgette Shell, MD  losartan (COZAAR) 25 MG tablet Take 1 tablet (25 mg total) by mouth daily. Patient not taking: Reported on 04/24/2020 01/09/20 02/08/20  Florencia Reasons, MD    Allergies    Cymbalta  [duloxetine hcl], Fentanyl, Metformin and related, Mobic [meloxicam], Sulfa antibiotics, and Voltaren [diclofenac]  Review of Systems   Review of Systems  Constitutional: Positive for appetite change and fatigue. Negative for fever.  HENT: Negative for sore throat.   Eyes: Negative for visual disturbance.  Respiratory: Negative for cough and shortness of breath.   Cardiovascular: Positive for leg swelling. Negative for chest pain.  Gastrointestinal: Positive for nausea and vomiting. Negative for abdominal pain and diarrhea.  Genitourinary: Negative for difficulty urinating.  Musculoskeletal: Negative for back pain and neck pain.  Skin: Negative for rash.  Neurological: Negative for dizziness, syncope, speech difficulty, weakness, numbness and headaches.    Physical Exam Updated Vital Signs BP (!) 175/65 (BP Location: Right Arm)   Pulse 86   Temp 98.6 F (37 C) (Oral)   Resp (!) 22   Ht 5\' 3"  (1.6 m)   Wt 114.4 kg   LMP 08/28/2008   SpO2 100%   BMI 44.69 kg/m   Physical Exam Vitals and nursing note reviewed.  Constitutional:      General: She is not in acute distress.    Appearance: She is well-developed. She is not diaphoretic.  HENT:     Head: Normocephalic and atraumatic.  Eyes:     Conjunctiva/sclera: Conjunctivae normal.  Cardiovascular:     Rate and Rhythm: Normal rate and regular rhythm.     Heart sounds: Normal heart sounds. No murmur heard.  No friction rub. No gallop.   Pulmonary:     Effort: Pulmonary effort is normal. No respiratory distress.     Breath sounds: Normal breath sounds. No wheezing or rales.  Abdominal:     General: There is no distension.     Palpations: Abdomen is soft.     Tenderness: There is no abdominal tenderness. There is no guarding.  Musculoskeletal:        General: No tenderness.     Cervical back: Normal range of motion.     Right lower leg: Edema (3+ bilaterally) present.     Left lower leg: Edema (pitting edema to abdomen)  present.  Skin:    General: Skin is warm and dry.  Findings: No erythema or rash.  Neurological:     Mental Status: She is alert.     Comments: Alert, oriented to self, location, not date Reports unsure or need to ask husband as answer for many questions, somewhat slow to respond, no focal abnormalities     ED Results / Procedures / Treatments   Labs (all labs ordered are listed, but only abnormal results are displayed) Labs Reviewed  CBC WITH DIFFERENTIAL/PLATELET - Abnormal; Notable for the following components:      Result Value   WBC 15.5 (*)    RBC 3.12 (*)    Hemoglobin 9.5 (*)    HCT 29.0 (*)    Neutro Abs 13.6 (*)    Lymphs Abs 0.6 (*)    Monocytes Absolute 1.1 (*)    Abs Immature Granulocytes 0.12 (*)    All other components within normal limits  COMPREHENSIVE METABOLIC PANEL - Abnormal; Notable for the following components:   Glucose, Bld 113 (*)    BUN 21 (*)    Creatinine, Ser 1.34 (*)    Calcium 7.5 (*)    Albumin 1.6 (*)    GFR calc non Af Amer 44 (*)    GFR calc Af Amer 51 (*)    All other components within normal limits  BRAIN NATRIURETIC PEPTIDE - Abnormal; Notable for the following components:   B Natriuretic Peptide 264.5 (*)    All other components within normal limits  AMMONIA - Abnormal; Notable for the following components:   Ammonia 38 (*)    All other components within normal limits  URINALYSIS, ROUTINE W REFLEX MICROSCOPIC - Abnormal; Notable for the following components:   Color, Urine AMBER (*)    APPearance CLOUDY (*)    Hgb urine dipstick MODERATE (*)    Protein, ur >=300 (*)    Leukocytes,Ua MODERATE (*)    Bacteria, UA MANY (*)    All other components within normal limits  COMPREHENSIVE METABOLIC PANEL - Abnormal; Notable for the following components:   Potassium 3.0 (*)    BUN 21 (*)    Creatinine, Ser 1.21 (*)    Calcium 7.7 (*)    Albumin 1.7 (*)    GFR calc non Af Amer 50 (*)    GFR calc Af Amer 58 (*)    All other  components within normal limits  CBC - Abnormal; Notable for the following components:   WBC 10.7 (*)    RBC 3.03 (*)    Hemoglobin 8.9 (*)    HCT 28.8 (*)    All other components within normal limits  GLUCOSE, CAPILLARY - Abnormal; Notable for the following components:   Glucose-Capillary 117 (*)    All other components within normal limits  SARS CORONAVIRUS 2 BY RT PCR (HOSPITAL ORDER, [facility])  URINE CULTURE  CULTURE, BLOOD (ROUTINE X 2)  CULTURE, BLOOD (ROUTINE X 2)  LACTIC ACID, PLASMA  PROTIME-INR  TSH  MAGNESIUM  GLUCOSE, CAPILLARY  GLUCOSE, CAPILLARY  HEMOGLOBIN A1C  POC OCCULT BLOOD, ED  TYPE AND SCREEN    EKG EKG Interpretation  Date/Time:  Saturday April 24 2020 13:43:25 EDT Ventricular Rate:  95 PR Interval:    QRS Duration: 107 QT Interval:  389 QTC Calculation: 489 R Axis:   64 Text Interpretation: Normal sinus rhythm Low voltage, precordial leads Borderline prolonged QT interval No significant change since last tracing Confirmed by Gareth Morgan [phone]) on 04/24/2020 4:09:05 PM   Radiology DG Chest Portable  1 View  Result Date: 04/24/2020 CLINICAL DATA:  Fatigue/weakness and bilateral leg swelling for 2 days. EXAM: PORTABLE CHEST 1 VIEW COMPARISON:  Chest radiograph dated 12/06/2017 FINDINGS: The heart remains enlarged. The left costophrenic angle is obscured and may reflect a pleural effusion with associated atelectasis/airspace disease. There is a mild diffuse bilateral interstitial opacities which likely represent pulmonary edema. There is no right pleural effusion. There is no pneumothorax. The osseous structures appear intact. IMPRESSION: 1. Mild diffuse bilateral interstitial opacities which likely represent pulmonary edema. 2. Possible left pleural effusion with associated atelectasis/airspace disease. 3. Cardiomegaly. Electronically Signed   By: Zerita Boers M.D.   On: 04/24/2020 14:35    Procedures Procedures  (including critical care time)  Medications Ordered in ED Medications  enoxaparin (LOVENOX) injection 60 mg (60 mg Subcutaneous Given 04/24/20 2124)  acetaminophen (TYLENOL) tablet 650 mg (has no administration in time range)    Or  acetaminophen (TYLENOL) suppository 650 mg (has no administration in time range)  furosemide (LASIX) injection 40 mg (40 mg Intravenous Given 04/24/20 1924)  hydrALAZINE (APRESOLINE) injection 5 mg (5 mg Intravenous Given 04/25/20 0635)  cefTRIAXone (ROCEPHIN) 1 g in sodium chloride 0.9 % 100 mL IVPB (has no administration in time range)  atorvastatin (LIPITOR) tablet 20 mg (20 mg Oral Given 04/24/20 2100)  carvedilol (COREG) tablet 6.25 mg (has no administration in time range)  FLUoxetine (PROZAC) capsule 10 mg (has no administration in time range)  HYDROcodone-acetaminophen (NORCO) 10-325 MG per tablet 1 tablet (1 tablet Oral Given 04/25/20 0633)  lacosamide (VIMPAT) tablet 200 mg (200 mg Oral Given 04/24/20 2124)  losartan (COZAAR) tablet 50 mg (50 mg Oral Given 04/24/20 2100)  insulin aspart (novoLOG) injection 0-15 Units (has no administration in time range)  insulin aspart (novoLOG) injection 0-5 Units (0 Units Subcutaneous Not Given 04/24/20 2120)  ondansetron (ZOFRAN) injection 4 mg (4 mg Intravenous Given 04/24/20 1921)  cefTRIAXone (ROCEPHIN) 1 g in sodium chloride 0.9 % 100 mL IVPB (1 g Intravenous New Bag/Given (Non-Interop) 04/24/20 1704)  furosemide (LASIX) injection 40 mg (40 mg Intravenous Given 04/24/20 1651)  labetalol (NORMODYNE) injection 10 mg (10 mg Intravenous Given 04/24/20 1649)  promethazine (PHENERGAN) injection 12.5 mg (12.5 mg Intravenous Given 04/24/20 2042)    ED Course  I have reviewed the triage vital signs and the nursing notes.  Pertinent labs & imaging results that were available during my care of the patient were reviewed by me and considered in my medical decision making (see chart for details).    MDM Rules/Calculators/A&P                           59yo female with history of DM, sarcoidosis on 4L via [place], seizure, myoclonus, cognitive problems/dementia, CHF who presents with nausea, generalized weakness, leg swelling.  Exam significant for anasarca.  Denies dyspnea, no increase in home oxygen, however chest x-ray shows signs of pulmonary edema and pleural effusions.  Labs show albumin of 1.6, UA with proteinuria which likely contribute to edema.  Ordered ammonia and INR for evaluation of liver function.  BNP 264, increased from prior. Suspect worsening CHF, given lasix.  Significant hypertension although symptoms began prior to this by husbands history and not clear if htn today secondary to pt missing medications and other etiology of n/v/swelling. Ordered labetalol.  Do not feel at this time encephalopathy secondary to htn given husband reports normal BP yesterday when symptoms began.  UA is consistent with  infection and pt with leukocytosis, concerning for possible UTI. Given rocephin.   History described with nausea and vomiting concerning for possible upper GI bleed or Mallory-Weiss tear, however no sign of clinically significant GI bleed on rectal exam, hemoglobin stable.    Will admit for continued care of encephalopathy/gen weakness with UTI, CHF/anasarca with hypoalbuminemia/proteinuria, and hypertension.  Final Clinical Impression(s) / ED Diagnoses Final diagnoses:  Acute on chronic congestive heart failure, unspecified heart failure type ([place])  Urinary tract infection without hematuria, site unspecified  Essential hypertension  Hypoalbuminemia  Anasarca  Nausea and vomiting, intractability of vomiting not specified, unspecified vomiting type    Rx / DC Orders ED Discharge Orders    None       Gareth Morgan, MD 04/25/20 [phone]

## 2020-04-25 ENCOUNTER — Inpatient Hospital Stay (HOSPITAL_COMMUNITY): Payer: Medicare Other

## 2020-04-25 DIAGNOSIS — I5031 Acute diastolic (congestive) heart failure: Secondary | ICD-10-CM

## 2020-04-25 DIAGNOSIS — I509 Heart failure, unspecified: Secondary | ICD-10-CM

## 2020-04-25 LAB — COMPREHENSIVE METABOLIC PANEL
ALT: 11 U/L (ref 0–44)
AST: 24 U/L (ref 15–41)
Albumin: 1.7 g/dL — ABNORMAL LOW (ref 3.5–5.0)
Alkaline Phosphatase: 74 U/L (ref 38–126)
Anion gap: 7 (ref 5–15)
BUN: 21 mg/dL — ABNORMAL HIGH (ref 6–20)
CO2: 30 mmol/L (ref 22–32)
Calcium: 7.7 mg/dL — ABNORMAL LOW (ref 8.9–10.3)
Chloride: 102 mmol/L (ref 98–111)
Creatinine, Ser: 1.21 mg/dL — ABNORMAL HIGH (ref 0.44–1.00)
GFR calc Af Amer: 58 mL/min — ABNORMAL LOW (ref >60)
GFR calc non Af Amer: 50 mL/min — ABNORMAL LOW (ref >60)
Glucose, Bld: 95 mg/dL (ref 70–99)
Potassium: 3 mmol/L — ABNORMAL LOW (ref 3.5–5.1)
Sodium: 139 mmol/L (ref 135–145)
Total Bilirubin: 0.5 mg/dL (ref 0.3–1.2)
Total Protein: 6.8 g/dL (ref 6.5–8.1)

## 2020-04-25 LAB — CBC
HCT: 28.8 % — ABNORMAL LOW (ref 36.0–46.0)
Hemoglobin: 8.9 g/dL — ABNORMAL LOW (ref 12.0–15.0)
MCH: 29.4 pg (ref 26.0–34.0)
MCHC: 30.9 g/dL (ref 30.0–36.0)
MCV: 95 fL (ref 80.0–100.0)
Platelets: 210 10*3/uL (ref 150–400)
RBC: 3.03 MIL/uL — ABNORMAL LOW (ref 3.87–5.11)
RDW: 13.8 % (ref 11.5–15.5)
WBC: 10.7 10*3/uL — ABNORMAL HIGH (ref 4.0–10.5)
nRBC: 0 % (ref 0.0–0.2)

## 2020-04-25 LAB — GLUCOSE, CAPILLARY
Glucose-Capillary: 79 mg/dL (ref 70–99)
Glucose-Capillary: 78 mg/dL (ref 70–99)
Glucose-Capillary: 90 mg/dL (ref 70–99)
Glucose-Capillary: 82 mg/dL (ref 70–99)
Glucose-Capillary: 83 mg/dL (ref 70–99)
Glucose-Capillary: 100 mg/dL — ABNORMAL HIGH (ref 70–99)

## 2020-04-25 LAB — BLOOD CULTURE ID PANEL (REFLEXED) - BCID2

## 2020-04-25 LAB — ECHOCARDIOGRAM COMPLETE
AR max vel: 1.07 cm2
AV Area VTI: 1.19 cm2
AV Area mean vel: 1.32 cm2
AV Mean grad: 13 mmHg
AV Peak grad: 27.2 mmHg
Ao pk vel: 2.61 m/s
Area-P 1/2: 2.54 cm2
Height: 63 in
S' Lateral: 2.9 cm
Weight: 4036.8 oz

## 2020-04-25 LAB — HEMOGLOBIN A1C
Hgb A1c MFr Bld: 4.6 % — ABNORMAL LOW (ref 4.8–5.6)
Mean Plasma Glucose: 85.32 mg/dL

## 2020-04-25 LAB — TSH: TSH: 0.584 u[IU]/mL (ref 0.350–4.500)

## 2020-04-25 LAB — MAGNESIUM: Magnesium: 1.8 mg/dL (ref 1.7–2.4)

## 2020-04-25 MED ORDER — POTASSIUM CHLORIDE CRYS ER 20 MEQ PO TBCR
40.0000 meq | EXTENDED_RELEASE_TABLET | Freq: Every day | ORAL | Status: DC
  Administered 2020-04-25 – 2020-04-27 (×3): 40 meq via ORAL
  Filled 2020-04-25 (×3): qty 2

## 2020-04-25 MED ORDER — METOCLOPRAMIDE HCL 5 MG PO TABS
5.0000 mg | ORAL_TABLET | Freq: Three times a day (TID) | ORAL | Status: DC
  Administered 2020-04-25 – 2020-05-07 (×36): 5 mg via ORAL
  Filled 2020-04-25 (×36): qty 1

## 2020-04-25 MED ORDER — ONDANSETRON 4 MG PO TBDP: 4.0000 mg | ORAL_TABLET | Freq: Three times a day (TID) | ORAL | Status: DC | PRN

## 2020-04-25 MED ORDER — PERFLUTREN LIPID MICROSPHERE
1.0000 mL | INTRAVENOUS | Status: AC | PRN
  Administered 2020-04-25: 3 mL via INTRAVENOUS
  Filled 2020-04-25: qty 10

## 2020-04-25 MED ORDER — METOLAZONE 2.5 MG PO TABS
2.5000 mg | ORAL_TABLET | Freq: Every day | ORAL | Status: DC | PRN
  Filled 2020-04-25: qty 1

## 2020-04-25 MED ORDER — FUROSEMIDE 10 MG/ML IJ SOLN
60.0000 mg | Freq: Two times a day (BID) | INTRAMUSCULAR | Status: DC
  Administered 2020-04-25 – 2020-04-29 (×8): 60 mg via INTRAVENOUS
  Filled 2020-04-25 (×8): qty 6

## 2020-04-25 MED ORDER — DONEPEZIL HCL 10 MG PO TABS
10.0000 mg | ORAL_TABLET | Freq: Every day | ORAL | Status: DC
  Administered 2020-04-25 – 2020-05-07 (×13): 10 mg via ORAL
  Filled 2020-04-25 (×13): qty 1

## 2020-04-25 NOTE — Progress Notes (Signed)
PHARMACY - PHYSICIAN COMMUNICATION CRITICAL VALUE ALERT - BLOOD CULTURE IDENTIFICATION (BCID)  Marissa Wilkerson is an 59 y.o. female who presented to [facility] on 04/24/2020 with Wilkerson chief complaint of AECHF  Assessment:  1/4 BCx bottles with staph species (suspect contam)  Name of physician (or Provider) Contacted: [place]  Current antibiotics: Rocephin (for UTI)  Changes to prescribed antibiotics recommended: treat as contam; no changes necessary Recommendations accepted by provider  Results for orders placed or performed during the hospital encounter of 04/24/20  Blood Culture ID Panel (Reflexed) (Collected: 04/24/2020  4:47 PM)  Result Value Ref Range   Enterococcus faecalis NOT DETECTED NOT DETECTED   Enterococcus Faecium NOT DETECTED NOT DETECTED   Listeria monocytogenes NOT DETECTED NOT DETECTED   Staphylococcus species DETECTED ([place]) NOT DETECTED   Staphylococcus aureus (BCID) NOT DETECTED NOT DETECTED   Staphylococcus epidermidis NOT DETECTED NOT DETECTED   Staphylococcus lugdunensis NOT DETECTED NOT DETECTED   Streptococcus species NOT DETECTED NOT DETECTED   Streptococcus agalactiae NOT DETECTED NOT DETECTED   Streptococcus pneumoniae NOT DETECTED NOT DETECTED   Streptococcus pyogenes NOT DETECTED NOT DETECTED   Wilkerson.calcoaceticus-baumannii NOT DETECTED NOT DETECTED   Bacteroides fragilis NOT DETECTED NOT DETECTED   Enterobacterales NOT DETECTED NOT DETECTED   Enterobacter cloacae complex NOT DETECTED NOT DETECTED   Escherichia coli NOT DETECTED NOT DETECTED   Klebsiella aerogenes NOT DETECTED NOT DETECTED   Klebsiella oxytoca NOT DETECTED NOT DETECTED   Klebsiella pneumoniae NOT DETECTED NOT DETECTED   Proteus species NOT DETECTED NOT DETECTED   Salmonella species NOT DETECTED NOT DETECTED   Serratia marcescens NOT DETECTED NOT DETECTED   Haemophilus influenzae NOT DETECTED NOT DETECTED   Neisseria meningitidis NOT DETECTED NOT DETECTED   Pseudomonas aeruginosa NOT DETECTED  NOT DETECTED   Stenotrophomonas maltophilia NOT DETECTED NOT DETECTED   Candida albicans NOT DETECTED NOT DETECTED   Candida auris NOT DETECTED NOT DETECTED   Candida glabrata NOT DETECTED NOT DETECTED   Candida krusei NOT DETECTED NOT DETECTED   Candida parapsilosis NOT DETECTED NOT DETECTED   Candida tropicalis NOT DETECTED NOT DETECTED   Cryptococcus neoformans/gattii NOT DETECTED NOT DETECTED    Marissa Wilkerson 04/25/2020  6:10 PM

## 2020-04-25 NOTE — Progress Notes (Signed)
Echocardiogram 2D Echocardiogram has been performed.  [place] Deputy Maurisio Ruddy 04/25/2020, 9:09 AM

## 2020-04-25 NOTE — Progress Notes (Signed)
PROGRESS NOTE    Marissa Wilkerson  XTG:[id number] DOB: September 07, 1960 DOA: 04/24/2020 PCP: Jani Gravel, MD  Brief Narrative:  43 white female Pulmonary sarcoid/chronic RF 4 L OSA not on CPAP with BMI of 40-44 HFpEF + HTN-poorly controlled IDDM TY 2 Prior left lower extremity gastrocnemius DVT Seizure disorder on Vimpat Plus reported dementia on Aricept/psychosis Anemia of chronic disease  She has had prior admissions for both acute heart failure exacerbation 09/2017 in addition to AKI later 10/19/2017 Most recently discharged 2/70/3500 2/2 metabolic encephalopathy?  Hypoglycemia electrolyte abnormality uncontrolled BP and was Rx for E. coli urinary infection  Returned [facility] ED acute hypoxic respiratory failure 2/2 exacerbation HFpEF acute exacerbation HFpEF  Assessment & Plan:   Principal Problem:   CHF exacerbation (HCC) Active Problems:   Hypertension   Diabetes mellitus without complication ([place])   Diastolic dysfunction   Sarcoidosis of lung (HCC)   Hyperlipidemia   CHF (congestive heart failure), NYHA class I, unspecified failure chronicity, diastolic (HCC)   History of seizure   1. Acute exacerbation HFpEF with anasarca a. Questionable compliance on meds-her husband gives them to her so I will need to speak to him later on this admission b. Increase Lasix to 60 twice daily IV it looks like she is on metolazone which we will continue at a dose of 2.5 c. wght dry is 240 at home, 250 here d. Is -1.2 L so far for this admission and we will continue to monitor aggressively e. Continue to get daily weights f. At baseline she is quite debilitated and needs to be lifted by her husband and will probably need skilled placement 2. Nausea a. Etiology unclear but could have some edema secondary to severe anasarca b. Continue to diurese use only Zofran and ODT Zofran at this time c. Monitor if has actual vomiting will back down to clear liquids and may be a soft diet 3. Hypokalemia, mild  hypomagnesemia a. Replace orally with potassium and recheck in a.m.-this is secondary to diuresis 4. Hypertensive crisis on admission a. Would not control any more aggressively at this stage given active and aggressive diuresis b. Continue currently Coreg 6.25 twice daily, losartan 50, as needed hydralazine for blood pressure above 160 c. If not controlled over the next day or so d. Will consider amlodipine or Isordil in addition 5. Controlled IDDM TY 2 with complication of hypoglycemia a. Low CBG this morning A1c 4.6 this admission b. Discontinue sliding scale supplementation 6. Chronic hypoxic respiratory failure secondary to sarcoidosis on 4 L at home a. Continue oxygen at this time 7. OSA not compliant with CPAP has apparently been told she DOES not have sleep apnea?? 8. Seizure disorder a. Continue Vimpat 200 twice daily 9. Psychosis a. Continue Prozac 10 daily 10. Dementia a. Continue Aricept 10 daily 11. ?  UTI on admission -clean catch a. Unclear how the urine was captured however it is reasonable to continue at this time ceftriaxone daily b. White count is resolving so would complete 3 days and discontinue  DVT prophylaxis: lovenox Code Status: full Family Communication: called husband [phone] Disposition:   Status is: Inpatient  Remains inpatient appropriate because:Hemodynamically unstable, Persistent severe electrolyte disturbances and IV treatments appropriate due to intensity of illness or inability to take PO   Dispo: The patient is from: Home              Anticipated d/c is to: Home              Anticipated  d/c date is: 3 days              Patient currently is not medically stable to d/c.   Consultants:   None currently  Procedures:  Echo IMPRESSIONS    1. Left ventricular ejection fraction, by estimation, is 60 to 65%. The  left ventricle has normal function. The left ventricle has no regional  wall motion abnormalities. There is mild left  ventricular hypertrophy.  Left ventricular diastolic parameters  are indeterminate.  2. Right ventricular systolic function is normal. The right ventricular  size is normal. Tricuspid regurgitation signal is inadequate for assessing  PA pressure.  3. Left atrial size was mildly dilated.  4. The mitral valve is grossly normal. Trivial mitral valve  regurgitation.  5. The aortic valve has an indeterminant number of cusps, possibly  tricuspid with moderate calcification. Aortic valve regurgitation is not  visualized. Mild to moderate aortic valve stenosis. Aortic valve mean  gradient measures 13.0 mmHg. Aortic valve  Vmax measures 2.61 m/s. Dimentionless index 0.34.  6. Cannot exclude very small interatrial shunt/PFO.  7. Probable ascites and left pleural effusion noted.   Antimicrobials: ceftriaxone    Subjective: Awake alert pleasant in nad no focal deficits No fever no chills ++nausea Doesn't stand at home   Objective: Vitals:   04/24/20 1945 04/24/20 2103 04/25/20 0252 04/25/20 0613  BP: (!) 157/83 (!) 175/62 (!) 186/73 (!) 175/65  Pulse: 86 86 90 86  Resp:  20 20 (!) 22  Temp:  98.6 F (37 C) 98.4 F (36.9 C) 98.6 F (37 C)  TempSrc:  Oral  Oral  SpO2:  100% 100% 100%  Weight:    114.4 kg  Height:        Intake/Output Summary (Last 24 hours) at 04/25/2020 1234 Last data filed at 04/25/2020 6301 Gross per 24 hour  Intake --  Output 1200 ml  Net -1200 ml   Filed Weights   04/24/20 1839 04/25/20 6010  Weight: 116 kg 114.4 kg    Examination:  General exam: eomi ncat thick neck Respiratory system: poor exam Cardiovascular system: s1 s2 no mr/gnsr Gastrointestinal system: obese +anasarca. Central nervous system: neuro intact Extremities: swollen in LE's Skin: as above Psychiatry: flat affect   Data Reviewed: I have personally reviewed following labs and imaging studies Potassium 3.0 BUNs/creatinine 21/1.2 Albumin 1.7 White count  15.5-->10.7 Hemoglobin 8.9 baseline seems to be 9-10  Radiology Studies: DG Chest Portable 1 View  Result Date: 04/24/2020 CLINICAL DATA:  Fatigue/weakness and bilateral leg swelling for 2 days. EXAM: PORTABLE CHEST 1 VIEW COMPARISON:  Chest radiograph dated 12/06/2017 FINDINGS: The heart remains enlarged. The left costophrenic angle is obscured and may reflect a pleural effusion with associated atelectasis/airspace disease. There is a mild diffuse bilateral interstitial opacities which likely represent pulmonary edema. There is no right pleural effusion. There is no pneumothorax. The osseous structures appear intact. IMPRESSION: 1. Mild diffuse bilateral interstitial opacities which likely represent pulmonary edema. 2. Possible left pleural effusion with associated atelectasis/airspace disease. 3. Cardiomegaly. Electronically Signed   By: Zerita Boers M.D.   On: 04/24/2020 14:35   ECHOCARDIOGRAM COMPLETE  Result Date: 04/25/2020    ECHOCARDIOGRAM REPORT   Patient Name:   Marissa Wilkerson Date of Exam: 04/25/2020 Medical Rec #:  [id number]     Height:       63.0 in Accession #:    [id number]    Weight:       252.3 lb Date of Birth:  1960-09-08     BSA:          2.134 m Patient Age:    59 years      BP:           175/65 mmHg Patient Gender: F             HR:           81 bpm. Exam Location:  Inpatient Procedure: 2D Echo, Color Doppler, Cardiac Doppler and Intracardiac            Opacification Agent Indications:    C14.48 Acute diastolic (congestive) heart failure  History:        Patient has prior history of Echocardiogram examinations, most                 recent 09/16/2017. CHF; Risk Factors:Hypertension, Diabetes and                 Dyslipidemia.  Sonographer:    Raquel Sarna Senior RDCS Referring Phys: [place]  Sonographer Comments: Technically difficult study due to patient body habitus and reduced apical skin integrity. IMPRESSIONS  1. Left ventricular ejection fraction, by estimation, is 60 to 65%.  The left ventricle has normal function. The left ventricle has no regional wall motion abnormalities. There is mild left ventricular hypertrophy. Left ventricular diastolic parameters are indeterminate.  2. Right ventricular systolic function is normal. The right ventricular size is normal. Tricuspid regurgitation signal is inadequate for assessing PA pressure.  3. Left atrial size was mildly dilated.  4. The mitral valve is grossly normal. Trivial mitral valve regurgitation.  5. The aortic valve has an indeterminant number of cusps, possibly tricuspid with moderate calcification. Aortic valve regurgitation is not visualized. Mild to moderate aortic valve stenosis. Aortic valve mean gradient measures 13.0 mmHg. Aortic valve Vmax measures 2.61 m/s. Dimentionless index 0.34.  6. Cannot exclude very small interatrial shunt/PFO.  7. Probable ascites and left pleural effusion noted. FINDINGS  Left Ventricle: Left ventricular ejection fraction, by estimation, is 60 to 65%. The left ventricle has normal function. The left ventricle has no regional wall motion abnormalities. Definity contrast agent was given IV to delineate the left ventricular  endocardial borders. The left ventricular internal cavity size was normal in size. There is mild left ventricular hypertrophy. Left ventricular diastolic parameters are indeterminate. Right Ventricle: The right ventricular size is normal. No increase in right ventricular wall thickness. Right ventricular systolic function is normal. Tricuspid regurgitation signal is inadequate for assessing PA pressure. Left Atrium: Left atrial size was mildly dilated. Right Atrium: Right atrial size was normal in size. Pericardium: There is no evidence of pericardial effusion. Mitral Valve: The mitral valve is grossly normal. Trivial mitral valve regurgitation. Tricuspid Valve: The tricuspid valve is grossly normal. Tricuspid valve regurgitation is trivial. Aortic Valve: The aortic valve has an  indeterminant number of cusps. Aortic valve regurgitation is not visualized. Mild to moderate aortic stenosis is present. There is moderate calcification of the aortic valve. Aortic valve mean gradient measures 13.0 mmHg. Aortic valve peak gradient measures 27.2 mmHg. Aortic valve area, by VTI measures 1.19 cm. Pulmonic Valve: The pulmonic valve was grossly normal. Pulmonic valve regurgitation is trivial. Aorta: The aortic root is normal in size and structure. IAS/Shunts: Cannot exclude very small interatrial shunt/PFO.  LEFT VENTRICLE PLAX 2D LVIDd:         5.00 cm  Diastology LVIDs:         2.90 cm  LV e'  lateral:   9.46 cm/s LV PW:         1.20 cm  LV E/e' lateral: 7.8 LV IVS:        1.00 cm  LV e' medial:    10.10 cm/s LVOT diam:     2.10 cm  LV E/e' medial:  7.3 LV SV:         69 LV SV Index:   32 LVOT Area:     3.46 cm  RIGHT VENTRICLE RV S prime:     12.20 cm/s TAPSE (M-mode): 3.3 cm LEFT ATRIUM             Index       RIGHT ATRIUM           Index LA diam:        5.20 cm 2.44 cm/m  RA Area:     16.60 cm LA Vol (A2C):   58.4 ml 27.36 ml/m RA Volume:   40.40 ml  18.93 ml/m LA Vol (A4C):   79.5 ml 37.25 ml/m LA Biplane Vol: 67.9 ml 31.81 ml/m  AORTIC VALVE AV Area (Vmax):    1.07 cm AV Area (Vmean):   1.32 cm AV Area (VTI):     1.19 cm AV Vmax:           261.00 cm/s AV Vmean:          161.000 cm/s AV VTI:            0.583 m AV Peak Grad:      27.2 mmHg AV Mean Grad:      13.0 mmHg LVOT Vmax:         80.90 cm/s LVOT Vmean:        61.200 cm/s LVOT VTI:          0.200 m LVOT/AV VTI ratio: 0.34  AORTA Ao Root diam: 2.90 cm Ao Asc diam:  3.10 cm MITRAL VALVE MV Area (PHT): 2.54 cm    SHUNTS MV Decel Time: 299 msec    Systemic VTI:  0.20 m MV E velocity: 73.50 cm/s  Systemic Diam: 2.10 cm MV A velocity: 81.40 cm/s MV E/A ratio:  0.90 Rozann Lesches MD Electronically signed by Rozann Lesches MD Signature Date/Time: 04/25/2020/10:42:22 AM    Final      Scheduled Meds: . atorvastatin  20 mg Oral Daily    . carvedilol  6.25 mg Oral BID WC  . donepezil  10 mg Oral Daily  . enoxaparin (LOVENOX) injection  60 mg Subcutaneous Q24H  . FLUoxetine  10 mg Oral Daily  . furosemide  60 mg Intravenous BID  . lacosamide  200 mg Oral BID  . losartan  50 mg Oral Daily  . metoCLOPramide  5 mg Oral TID AC  . potassium chloride  40 mEq Oral Daily   Continuous Infusions: . cefTRIAXone (ROCEPHIN)  IV       LOS: 1 day    Time spent: [place], MD [facility] To contact the attending provider between 7A-7P or the covering provider during after hours 7P-7A, please log into the web site [email or web address] and access using universal [place] password for that web site. If you do not have the password, please call the hospital operator.  04/25/2020, 12:34 PM

## 2020-04-25 NOTE — Plan of Care (Signed)

## 2020-04-26 ENCOUNTER — Inpatient Hospital Stay (HOSPITAL_COMMUNITY): Payer: Medicare Other

## 2020-04-26 LAB — CBC WITH DIFFERENTIAL/PLATELET
Abs Immature Granulocytes: 0.07 10*3/uL (ref 0.00–0.07)
Basophils Absolute: 0 10*3/uL (ref 0.0–0.1)
Basophils Relative: 0 %
Eosinophils Absolute: 0.2 10*3/uL (ref 0.0–0.5)
Eosinophils Relative: 2 %
HCT: 27.6 % — ABNORMAL LOW (ref 36.0–46.0)
Hemoglobin: 8.8 g/dL — ABNORMAL LOW (ref 12.0–15.0)
Immature Granulocytes: 1 %
Lymphocytes Relative: 9 %
Lymphs Abs: 0.8 10*3/uL (ref 0.7–4.0)
MCH: 30.6 pg (ref 26.0–34.0)
MCHC: 31.9 g/dL (ref 30.0–36.0)
MCV: 95.8 fL (ref 80.0–100.0)
Monocytes Absolute: 1.1 10*3/uL — ABNORMAL HIGH (ref 0.1–1.0)
Monocytes Relative: 12 %
Neutro Abs: 7.2 10*3/uL (ref 1.7–7.7)
Neutrophils Relative %: 76 %
Platelets: 209 10*3/uL (ref 150–400)
RBC: 2.88 MIL/uL — ABNORMAL LOW (ref 3.87–5.11)
RDW: 13.8 % (ref 11.5–15.5)
WBC: 9.4 10*3/uL (ref 4.0–10.5)
nRBC: 0 % (ref 0.0–0.2)

## 2020-04-26 LAB — GLUCOSE, CAPILLARY
Glucose-Capillary: 86 mg/dL (ref 70–99)
Glucose-Capillary: 93 mg/dL (ref 70–99)
Glucose-Capillary: 99 mg/dL (ref 70–99)
Glucose-Capillary: 116 mg/dL — ABNORMAL HIGH (ref 70–99)

## 2020-04-26 LAB — URINE CULTURE: Culture: >=100000 — AB

## 2020-04-26 LAB — COMPREHENSIVE METABOLIC PANEL
ALT: 10 U/L (ref 0–44)
AST: 19 U/L (ref 15–41)
Albumin: 1.5 g/dL — ABNORMAL LOW (ref 3.5–5.0)
Alkaline Phosphatase: 63 U/L (ref 38–126)
Anion gap: 5 (ref 5–15)
BUN: 19 mg/dL (ref 6–20)
CO2: 31 mmol/L (ref 22–32)
Calcium: 7.4 mg/dL — ABNORMAL LOW (ref 8.9–10.3)
Chloride: 101 mmol/L (ref 98–111)
Creatinine, Ser: 1.07 mg/dL — ABNORMAL HIGH (ref 0.44–1.00)
GFR calc Af Amer: >60 mL/min (ref >60)
GFR calc non Af Amer: 58 mL/min — ABNORMAL LOW (ref >60)
Glucose, Bld: 99 mg/dL (ref 70–99)
Potassium: 3.4 mmol/L — ABNORMAL LOW (ref 3.5–5.1)
Sodium: 137 mmol/L (ref 135–145)
Total Bilirubin: 0.3 mg/dL (ref 0.3–1.2)
Total Protein: 6 g/dL — ABNORMAL LOW (ref 6.5–8.1)

## 2020-04-26 LAB — MAGNESIUM: Magnesium: 1.5 mg/dL — ABNORMAL LOW (ref 1.7–2.4)

## 2020-04-26 NOTE — Plan of Care (Signed)
  Problem: Coping: Goal: Level of anxiety will decrease Outcome: Progressing   Problem: Pain Managment: Goal: General experience of comfort will improve Outcome: Progressing   

## 2020-04-26 NOTE — Progress Notes (Addendum)
PROGRESS NOTE    Marissa Wilkerson  WCH:[id number] DOB: 22-Nov-1960 DOA: 04/24/2020 PCP: Jani Gravel, MD  Brief Narrative:  59 white female Pulmonary sarcoid/chronic RF 4 L BMI of 40-44 HFpEF + HTN-poorly controlled IDDM TY 2 Prior left lower extremity gastrocnemius DVT Seizure disorder on Vimpat Plus reported dementia on Aricept/psychosis Anemia of chronic disease  She has had prior admissions for both acute heart failure exacerbation 09/2017 in addition to AKI later 10/19/2017 Most recently discharged 3/58/6144 2/2 metabolic encephalopathy?  Hypoglycemia electrolyte abnormality uncontrolled BP and was Rx for E. coli urinary infection  Returned [facility] acute hypoxic respiratory failure 2/2 exacerbation HFpEF acute exacerbation HFpEF  Assessment & Plan:   Principal Problem:   CHF exacerbation (HCC) Active Problems:   Hypertension   Diabetes mellitus without complication ([place])   Diastolic dysfunction   Sarcoidosis of lung (HCC)   Hyperlipidemia   CHF (congestive heart failure), NYHA class I, unspecified failure chronicity, diastolic (HCC)   History of seizure   1. Acute exacerbation HFpEF with anasarca a. cont Lasix to 60 twice daily IV it looks like she is on metolazone which we will continue at a dose of 2.5 b. wght dry is 240 at home, 250 here--her weight has remained constant and may be inaccurate 2. Nausea and possibly 3. ?  Cirrhosis secondary to fatty liver a. Etiology unclear but could have some edema secondary to severe anasarca i. When I examined her today I think I feel an enlarged liver ii. She has never had a work-up for cirrhosis despite her habitus so I will order abdominal ultrasound given her habitus as I also appreciate some shifting dullness 4. Breast mass?? a. Last CT chest 08/2017 = lung nodularities but also skin thickening of left breast felt to be secondary to just edema b. We will examine the same a.m. with chaperone 5. Hypokalemia, mild  hypomagnesemia a. Replace orally with potassium-improving 6. Hypertensive crisis on admission a. Continue currently Coreg 6.25 twice daily, losartan 50, as needed hydralazine for blood pressure above 160 b. Somewhat better controlled today-we will consider amlodipine/Isordil if needed 8/29 7. Controlled IDDM TY 2 with complication of hypoglycemia a. Low CBG 8/29 therefore stopped sliding scale A1c 4.6 this admission b. Discontinue sliding scale supplementation 8. Underlying sarcoidosis with pulmonary and renal manifestations a. Previously on methotrexate off of it since 2018 b. 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 Split-night study 03/12/2018 showed no obstructive or central sleep apnea i. Continue oxygen at this time ii. Needs outpatient further follow-up with Dr. Lamonte Sakai who is seen her in 2019 9. Seizure disorder a. Continue Vimpat 200 twice daily-follow-up with [facility] neurologist who sees her regularly 10. Psychosis a. Continue Prozac 10 daily 11. Dementia a. Continue Aricept 10 daily 12. ?  UTI on admission -clean catch a. Unclear how the urine was captured however it is reasonable to continue at this time ceftriaxone daily b. White count is resolving so would complete 3 days and discontinue  DVT prophylaxis: lovenox Code Status: full and met him and spoke with him at the bedside on 8/30 Family Communication: called husband [phone] Disposition:   Status is: Inpatient  Remains inpatient appropriate because:Hemodynamically unstable, Persistent severe electrolyte disturbances and IV treatments appropriate due to intensity of illness or inability to take PO   Dispo: The patient is from: Home              Anticipated d/c is to: Home  Anticipated d/c date is: 58 days              Patient currently is not medically stable to d/c.   Consultants:   None currently  Procedures:  Echo IMPRESSIONS    1. Left ventricular  ejection fraction, by estimation, is 60 to 65%. The  left ventricle has normal function. The left ventricle has no regional  wall motion abnormalities. There is mild left ventricular hypertrophy.  Left ventricular diastolic parameters  are indeterminate.  2. Right ventricular systolic function is normal. The right ventricular  size is normal. Tricuspid regurgitation signal is inadequate for assessing  PA pressure.  3. Left atrial size was mildly dilated.  4. The mitral valve is grossly normal. Trivial mitral valve  regurgitation.  5. The aortic valve has an indeterminant number of cusps, possibly  tricuspid with moderate calcification. Aortic valve regurgitation is not  visualized. Mild to moderate aortic valve stenosis. Aortic valve mean  gradient measures 13.0 mmHg. Aortic valve  Vmax measures 2.61 m/s. Dimentionless index 0.34.  6. Cannot exclude very small interatrial shunt/PFO.  7. Probable ascites and left pleural effusion noted.   Antimicrobials: ceftriaxone    Subjective:  "I do not feel good today" Cannot specify Eating some No overt headache    Objective: Vitals:   04/25/20 1235 04/25/20 2055 04/26/20 0502 04/26/20 0643  BP: (!) 179/68 (!) 178/71 (!) 190/80 133/61  Pulse: 82 81 81 76  Resp: '18 18 18   ' Temp: 99 F (37.2 C) 98.8 F (37.1 C) 98.2 F (36.8 C)   TempSrc:  Oral Oral   SpO2: 100% 100% 100%   Weight:   114.8 kg   Height:        Intake/Output Summary (Last 24 hours) at 04/26/2020 1359 Last data filed at 04/26/2020 1696 Gross per 24 hour  Intake 260 ml  Output 1050 ml  Net -790 ml   Filed Weights   04/24/20 1839 04/25/20 0613 04/26/20 0502  Weight: 116 kg 114.4 kg 114.8 kg    Examination:  General exam: eomi ncat thick neck Respiratory system: poor exam Cardiovascular system: s1 s2 no mr/gnsr Gastrointestinal system: obese +anasarca--she has tender hard mass in the right upper quadrant and possible shifting dullness She has  significant anasarca still Central nervous system: neuro intact Extremities: swollen in LE's Skin: as above Psychiatry: flat affect   Data Reviewed: I have personally reviewed following labs and imaging studies Potassium 3.0->8.4 BUNs/creatinine 21/1.2-->19/1.07 White count 15.5-->10.7-->9.4 Hemoglobin 8.9 baseline seems to be 9-10  Radiology Studies: DG Chest 2 View  Result Date: 04/26/2020 CLINICAL DATA:  Pneumonia, chronic diastolic heart failure, sarcoid. EXAM: CHEST - 2 VIEW COMPARISON:  Multiple priors, most recent 04/24/2020 FINDINGS: Overall improved aeration lungs with similar mild diffuse interstitial opacities, most pronounced at the lateral lung bases. Similar small left pleural effusion. Overlying left basilar opacities. Similar enlargement of the cardiac silhouette. Similar dextrocurvature of the spine. Diffuse osteopenia. IMPRESSION: 1. Overall improved aeration lungs. Similar mild diffuse interstitial opacities, suggestive of mild interstitial edema. 2. Similar small left pleural effusion. Overlying left basilar opacities may represent atelectasis, aspiration, and/or pneumonia. 3. Cardiomegaly. Electronically Signed   By: Margaretha Sheffield MD   On: 04/26/2020 09:40   ECHOCARDIOGRAM COMPLETE  Result Date: 04/25/2020    ECHOCARDIOGRAM REPORT   Patient Name:   Marissa Wilkerson Date of Exam: 04/25/2020 Medical Rec #:  [id number]     Height:       63.0 in Accession #:  [id number]    Weight:       252.3 lb Date of Birth:  August 21, 1961     BSA:          2.134 m Patient Age:    59 years      BP:           175/65 mmHg Patient Gender: F             HR:           81 bpm. Exam Location:  Inpatient Procedure: 2D Echo, Color Doppler, Cardiac Doppler and Intracardiac            Opacification Agent Indications:    F79.02 Acute diastolic (congestive) heart failure  History:        Patient has prior history of Echocardiogram examinations, most                 recent 09/16/2017. CHF; Risk  Factors:Hypertension, Diabetes and                 Dyslipidemia.  Sonographer:    Raquel Sarna Senior RDCS Referring Phys: [place]  Sonographer Comments: Technically difficult study due to patient body habitus and reduced apical skin integrity. IMPRESSIONS  1. Left ventricular ejection fraction, by estimation, is 60 to 65%. The left ventricle has normal function. The left ventricle has no regional wall motion abnormalities. There is mild left ventricular hypertrophy. Left ventricular diastolic parameters are indeterminate.  2. Right ventricular systolic function is normal. The right ventricular size is normal. Tricuspid regurgitation signal is inadequate for assessing PA pressure.  3. Left atrial size was mildly dilated.  4. The mitral valve is grossly normal. Trivial mitral valve regurgitation.  5. The aortic valve has an indeterminant number of cusps, possibly tricuspid with moderate calcification. Aortic valve regurgitation is not visualized. Mild to moderate aortic valve stenosis. Aortic valve mean gradient measures 13.0 mmHg. Aortic valve Vmax measures 2.61 m/s. Dimentionless index 0.34.  6. Cannot exclude very small interatrial shunt/PFO.  7. Probable ascites and left pleural effusion noted. FINDINGS  Left Ventricle: Left ventricular ejection fraction, by estimation, is 60 to 65%. The left ventricle has normal function. The left ventricle has no regional wall motion abnormalities. Definity contrast agent was given IV to delineate the left ventricular  endocardial borders. The left ventricular internal cavity size was normal in size. There is mild left ventricular hypertrophy. Left ventricular diastolic parameters are indeterminate. Right Ventricle: The right ventricular size is normal. No increase in right ventricular wall thickness. Right ventricular systolic function is normal. Tricuspid regurgitation signal is inadequate for assessing PA pressure. Left Atrium: Left atrial size was mildly dilated. Right  Atrium: Right atrial size was normal in size. Pericardium: There is no evidence of pericardial effusion. Mitral Valve: The mitral valve is grossly normal. Trivial mitral valve regurgitation. Tricuspid Valve: The tricuspid valve is grossly normal. Tricuspid valve regurgitation is trivial. Aortic Valve: The aortic valve has an indeterminant number of cusps. Aortic valve regurgitation is not visualized. Mild to moderate aortic stenosis is present. There is moderate calcification of the aortic valve. Aortic valve mean gradient measures 13.0 mmHg. Aortic valve peak gradient measures 27.2 mmHg. Aortic valve area, by VTI measures 1.19 cm. Pulmonic Valve: The pulmonic valve was grossly normal. Pulmonic valve regurgitation is trivial. Aorta: The aortic root is normal in size and structure. IAS/Shunts: Cannot exclude very small interatrial shunt/PFO.  LEFT VENTRICLE PLAX 2D LVIDd:         5.00  cm  Diastology LVIDs:         2.90 cm  LV e' lateral:   9.46 cm/s LV PW:         1.20 cm  LV E/e' lateral: 7.8 LV IVS:        1.00 cm  LV e' medial:    10.10 cm/s LVOT diam:     2.10 cm  LV E/e' medial:  7.3 LV SV:         69 LV SV Index:   32 LVOT Area:     3.46 cm  RIGHT VENTRICLE RV S prime:     12.20 cm/s TAPSE (M-mode): 3.3 cm LEFT ATRIUM             Index       RIGHT ATRIUM           Index LA diam:        5.20 cm 2.44 cm/m  RA Area:     16.60 cm LA Vol (A2C):   58.4 ml 27.36 ml/m RA Volume:   40.40 ml  18.93 ml/m LA Vol (A4C):   79.5 ml 37.25 ml/m LA Biplane Vol: 67.9 ml 31.81 ml/m  AORTIC VALVE AV Area (Vmax):    1.07 cm AV Area (Vmean):   1.32 cm AV Area (VTI):     1.19 cm AV Vmax:           261.00 cm/s AV Vmean:          161.000 cm/s AV VTI:            0.583 m AV Peak Grad:      27.2 mmHg AV Mean Grad:      13.0 mmHg LVOT Vmax:         80.90 cm/s LVOT Vmean:        61.200 cm/s LVOT VTI:          0.200 m LVOT/AV VTI ratio: 0.34  AORTA Ao Root diam: 2.90 cm Ao Asc diam:  3.10 cm MITRAL VALVE MV Area (PHT): 2.54 cm     SHUNTS MV Decel Time: 299 msec    Systemic VTI:  0.20 m MV E velocity: 73.50 cm/s  Systemic Diam: 2.10 cm MV A velocity: 81.40 cm/s MV E/A ratio:  0.90 Rozann Lesches MD Electronically signed by Rozann Lesches MD Signature Date/Time: 04/25/2020/10:42:22 AM    Final      Scheduled Meds: . atorvastatin  20 mg Oral Daily  . carvedilol  6.25 mg Oral BID WC  . donepezil  10 mg Oral Daily  . enoxaparin (LOVENOX) injection  60 mg Subcutaneous Q24H  . FLUoxetine  10 mg Oral Daily  . furosemide  60 mg Intravenous BID  . lacosamide  200 mg Oral BID  . losartan  50 mg Oral Daily  . metoCLOPramide  5 mg Oral TID AC  . potassium chloride  40 mEq Oral Daily   Continuous Infusions: . cefTRIAXone (ROCEPHIN)  IV 1 g (04/25/20 1820)     LOS: 2 days    Time spent: 68  Nita Sells, MD [facility] To contact the attending provider between 7A-7P or the covering provider during after hours 7P-7A, please log into the web site [email or web address] and access using universal [place] password for that web site. If you do not have the password, please call the hospital operator.  04/26/2020, 1:59 PM

## 2020-04-27 ENCOUNTER — Ambulatory Visit: Payer: Medicare Other

## 2020-04-27 LAB — COMPREHENSIVE METABOLIC PANEL
ALT: 10 U/L (ref 0–44)
AST: 16 U/L (ref 15–41)
Albumin: 1.4 g/dL — ABNORMAL LOW (ref 3.5–5.0)
Alkaline Phosphatase: 56 U/L (ref 38–126)
Anion gap: 8 (ref 5–15)
BUN: 21 mg/dL — ABNORMAL HIGH (ref 6–20)
CO2: 31 mmol/L (ref 22–32)
Calcium: 7.3 mg/dL — ABNORMAL LOW (ref 8.9–10.3)
Chloride: 98 mmol/L (ref 98–111)
Creatinine, Ser: 1.23 mg/dL — ABNORMAL HIGH (ref 0.44–1.00)
GFR calc Af Amer: 56 mL/min — ABNORMAL LOW (ref >60)
GFR calc non Af Amer: 49 mL/min — ABNORMAL LOW (ref >60)
Glucose, Bld: 95 mg/dL (ref 70–99)
Potassium: 3.2 mmol/L — ABNORMAL LOW (ref 3.5–5.1)
Sodium: 137 mmol/L (ref 135–145)
Total Bilirubin: 0.2 mg/dL — ABNORMAL LOW (ref 0.3–1.2)
Total Protein: 5.3 g/dL — ABNORMAL LOW (ref 6.5–8.1)

## 2020-04-27 LAB — GLUCOSE, CAPILLARY
Glucose-Capillary: 90 mg/dL (ref 70–99)
Glucose-Capillary: 93 mg/dL (ref 70–99)
Glucose-Capillary: 103 mg/dL — ABNORMAL HIGH (ref 70–99)
Glucose-Capillary: 95 mg/dL (ref 70–99)

## 2020-04-27 LAB — CBC WITH DIFFERENTIAL/PLATELET
Abs Immature Granulocytes: 0.04 10*3/uL (ref 0.00–0.07)
Basophils Absolute: 0 10*3/uL (ref 0.0–0.1)
Basophils Relative: 0 %
Eosinophils Absolute: 0.2 10*3/uL (ref 0.0–0.5)
Eosinophils Relative: 3 %
HCT: 26.2 % — ABNORMAL LOW (ref 36.0–46.0)
Hemoglobin: 8 g/dL — ABNORMAL LOW (ref 12.0–15.0)
Immature Granulocytes: 1 %
Lymphocytes Relative: 8 %
Lymphs Abs: 0.5 10*3/uL — ABNORMAL LOW (ref 0.7–4.0)
MCH: 29.7 pg (ref 26.0–34.0)
MCHC: 30.5 g/dL (ref 30.0–36.0)
MCV: 97.4 fL (ref 80.0–100.0)
Monocytes Absolute: 0.7 10*3/uL (ref 0.1–1.0)
Monocytes Relative: 12 %
Neutro Abs: 4.7 10*3/uL (ref 1.7–7.7)
Neutrophils Relative %: 76 %
Platelets: 148 10*3/uL — ABNORMAL LOW (ref 150–400)
RBC: 2.69 MIL/uL — ABNORMAL LOW (ref 3.87–5.11)
RDW: 13.4 % (ref 11.5–15.5)
WBC: 6.2 10*3/uL (ref 4.0–10.5)
nRBC: 0 % (ref 0.0–0.2)

## 2020-04-27 LAB — CULTURE, BLOOD (ROUTINE X 2): Special Requests: ADEQUATE

## 2020-04-27 LAB — AMMONIA: Ammonia: 25 umol/L (ref 9–35)

## 2020-04-27 MED ORDER — SPIRONOLACTONE 12.5 MG HALF TABLET
12.5000 mg | ORAL_TABLET | Freq: Every day | ORAL | Status: DC
  Administered 2020-04-27 – 2020-04-29 (×3): 12.5 mg via ORAL
  Filled 2020-04-27 (×3): qty 1

## 2020-04-27 MED ORDER — MAGNESIUM SULFATE 2 GM/50ML IV SOLN
2.0000 g | Freq: Once | INTRAVENOUS | Status: AC
  Administered 2020-04-27: 2 g via INTRAVENOUS
  Filled 2020-04-27: qty 50

## 2020-04-27 NOTE — Progress Notes (Signed)
Pt c/o being uncomfortable in bed, attempted repositioning numerous times, unsuccessful. Pt transferred onto a low air loss mattress, still c/o needing repositioned multiple times. Pt refuses to change from her left side lying position. Educated her on risks of not  changing positions and benefits of the low air loss bed, pt still non compliant.

## 2020-04-27 NOTE — Progress Notes (Signed)
PROGRESS NOTE    Marissa Wilkerson  JYN:[id number] DOB: 09-Jan-1961 DOA: 04/24/2020 PCP: Jani Gravel, MD  Brief Narrative:  45 white female Pulmonary sarcoid/chronic RF 4 L BMI of 40-44 HFpEF + HTN-poorly controlled IDDM TY 2 Prior left lower extremity gastrocnemius DVT Seizure disorder on Vimpat Plus reported dementia on Aricept/psychosis Anemia of chronic disease  She has had prior admissions for both acute heart failure exacerbation 09/2017 in addition to AKI later 10/19/2017 Most recently discharged 8/65/7846 2/2 metabolic encephalopathy?  Hypoglycemia electrolyte abnormality uncontrolled BP and was Rx for E. coli urinary infection  [facility] acute hypoxic respiratory failure 2/2 exacerbation HFpEF acute exacerbation HFpEF  Assessment & Plan:   Principal Problem:   CHF exacerbation (HCC) Active Problems:   Hypertension   Diabetes mellitus without complication ([place])   Diastolic dysfunction   Sarcoidosis of lung (HCC)   Hyperlipidemia   CHF (congestive heart failure), NYHA class I, unspecified failure chronicity, diastolic (HCC)   History of seizure   1. Acute exacerbation HFpEF with anasarca a. cont Lasix to 60 twice daily IV it looks like she is on metolazone which we will continue at a dose of 2.5 b. -3.4 L since admission c. wght dry is 240 at home, 250 here--weights are inaccurate d. though she feels "puffy" today, myriad causes including albumin 1.4 2. Nausea and possibly 3. Cirrhosis on ultrasound a. Etiology unclear but could have some edema secondary to severe anasarca i. We will start Aldactone low-dose ii. Will receive education with printouts later on regarding the same and low-salt diet iii. Ammonia is normal 4. Breast mass?? a. Last CT chest 08/2017 = lung nodularities but also skin thickening of left breast felt to be secondary to just edema b. We will examine the same a.m. with chaperone breast exam relatively benign c. OP Mammogram 5. hypokalemia, mild  hypomagnesemia a. Replace orally with potassium-improving 6. Hypertensive crisis on admission a. keep coreg 6.25 as actively diuresing b. Continue losartan 50, as needed hydralazine for blood pressure above 160 7. Controlled IDDM TY 2 with complication of hypoglycemia a. Low CBG 8/29 therefore stopped sliding scale A1c 4.6 this admission b. Discontinue sliding scale supplementation 8. Underlying sarcoidosis with pulmonary and renal manifestations a. Previously on methotrexate off of it since 2018 b. 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 Split-night study 03/12/2018 showed no obstructive or central sleep apnea i. Continue oxygen at this time ii. Needs outpatient further follow-up with Dr. Lamonte Sakai who is seen her in 2019 9. Seizure disorder a. Continue Vimpat 200 twice daily-follow-up with [facility] neurologist who sees her regularly 10. Psychosis a. Continue Prozac 10 daily 11. Dementia a. Continue Aricept 10 daily 12. ?  UTI on admission -clean catch a. Unclear how the urine was captured however it is reasonable to continue at this time ceftriaxone daily b. Discontinued ceftriaxone on 8/31  DVT prophylaxis: lovenox Code Status: full and met him and spoke with him at the bedside on 8/30 Family Communication: called husband [phone] on 8/31 Disposition:   Status is: Inpatient  Remains inpatient appropriate because:Hemodynamically unstable, Persistent severe electrolyte disturbances and IV treatments appropriate due to intensity of illness or inability to take PO   Dispo: The patient is from: Home              Anticipated d/c is to: Home              Anticipated d/c date is: 3 days  Patient currently is not medically stable to d/c.   Consultants:   None currently  Procedures:  Echo IMPRESSIONS    1. Left ventricular ejection fraction, by estimation, is 60 to 65%. The  left ventricle has normal function. The left  ventricle has no regional  wall motion abnormalities. There is mild left ventricular hypertrophy.  Left ventricular diastolic parameters  are indeterminate.  2. Right ventricular systolic function is normal. The right ventricular  size is normal. Tricuspid regurgitation signal is inadequate for assessing  PA pressure.  3. Left atrial size was mildly dilated.  4. The mitral valve is grossly normal. Trivial mitral valve  regurgitation.  5. The aortic valve has an indeterminant number of cusps, possibly  tricuspid with moderate calcification. Aortic valve regurgitation is not  visualized. Mild to moderate aortic valve stenosis. Aortic valve mean  gradient measures 13.0 mmHg. Aortic valve  Vmax measures 2.61 m/s. Dimentionless index 0.34.  6. Cannot exclude very small interatrial shunt/PFO.  7. Probable ascites and left pleural effusion noted.   Antimicrobials: ceftriaxone    Subjective:  Feels swollen No new issues otherwise I examined her breast with chaperone    Objective: Vitals:   04/26/20 1430 04/26/20 2115 04/27/20 0610 04/27/20 0612  BP: (!) 174/69 (!) 152/64 (!) 151/65   Pulse: 68 63 66   Resp: '19 16 18   ' Temp: 98.1 F (36.7 C) 98 F (36.7 C) 97.6 F (36.4 C)   TempSrc:  Oral Oral   SpO2: 100% 100% 99%   Weight:    115.1 kg  Height:        Intake/Output Summary (Last 24 hours) at 04/27/2020 1140 Last data filed at 04/26/2020 1830 Gross per 24 hour  Intake --  Output 800 ml  Net -800 ml   Filed Weights   04/25/20 7544 04/26/20 0502 04/27/20 0612  Weight: 114.4 kg 114.8 kg 115.1 kg    Examination:  General exam: eomi ncat thick neck Respiratory system: poor exam Breast exam is benig daten--no lunmps or bumps to my unaccustomed exam Cardiovascular system: s1 s2 no mr/gnsr Gastrointestinal system: obese +anasarca--she has tender hard mass in the right upper quadrant and possible shifting dullness She has significant anasarca still Central nervous  system: neuro intact Extremities: swollen in LE's Skin: as above Psychiatry: flat affect   Data Reviewed: I have personally reviewed following labs and imaging studies Potassium 3.0->3.2 BUNs/creatinine 21/1.2-->19/1.07--->23/1.2 White count 15.5-->10.7-->9.4-->6.0 Hemoglobin 8.9 baseline seems to be 9-10  Radiology Studies: DG Chest 2 View  Result Date: 04/26/2020 CLINICAL DATA:  Pneumonia, chronic diastolic heart failure, sarcoid. EXAM: CHEST - 2 VIEW COMPARISON:  Multiple priors, most recent 04/24/2020 FINDINGS: Overall improved aeration lungs with similar mild diffuse interstitial opacities, most pronounced at the lateral lung bases. Similar small left pleural effusion. Overlying left basilar opacities. Similar enlargement of the cardiac silhouette. Similar dextrocurvature of the spine. Diffuse osteopenia. IMPRESSION: 1. Overall improved aeration lungs. Similar mild diffuse interstitial opacities, suggestive of mild interstitial edema. 2. Similar small left pleural effusion. Overlying left basilar opacities may represent atelectasis, aspiration, and/or pneumonia. 3. Cardiomegaly. Electronically Signed   By: Margaretha Sheffield MD   On: 04/26/2020 09:40   US Abdomen Complete  Result Date: 04/26/2020 CLINICAL DATA:  Hepatic cirrhosis. EXAM: ABDOMEN ULTRASOUND COMPLETE COMPARISON:  September 15, 2017. FINDINGS: Gallbladder: Status post cholecystectomy. Common bile duct: Diameter: 14 mm which may be due to post cholecystectomy status. Liver: Heterogeneous echotexture is noted with slightly nodular contours suggesting hepatic cirrhosis. No definite  focal sonographic hepatic abnormality is noted. Portal vein is patent on color Doppler imaging with normal direction of blood flow towards the liver. IVC: No abnormality visualized. Pancreas: Not well visualized due to overlying bowel gas. Spleen: Size and appearance within normal limits. Right Kidney: Length: 12 cm. Echogenicity within normal limits. No mass  or hydronephrosis visualized. Left Kidney: Length: 10.3 cm. Echogenicity within normal limits. No mass or hydronephrosis visualized. Abdominal aorta: No aneurysm visualized. Other findings: Mild ascites is noted around the liver. IMPRESSION: 1. Status post cholecystectomy. 2. Common bile duct is dilated at 14 mm which may be due to post cholecystectomy status. 3. Heterogeneous echotexture of hepatic parenchyma is noted with slightly nodular contours suggesting hepatic cirrhosis. No definite focal sonographic hepatic abnormality is noted. Mild ascites is noted around the liver. Electronically Signed   By: Marijo Conception M.D.   On: 04/26/2020 14:51     Scheduled Meds: . atorvastatin  20 mg Oral Daily  . carvedilol  6.25 mg Oral BID WC  . donepezil  10 mg Oral Daily  . enoxaparin (LOVENOX) injection  60 mg Subcutaneous Q24H  . FLUoxetine  10 mg Oral Daily  . furosemide  60 mg Intravenous BID  . lacosamide  200 mg Oral BID  . losartan  50 mg Oral Daily  . metoCLOPramide  5 mg Oral TID AC  . potassium chloride  40 mEq Oral Daily  . spironolactone  12.5 mg Oral Daily   Continuous Infusions: . magnesium sulfate bolus IVPB       LOS: 3 days    Time spent: [place], MD [facility] To contact the attending provider between 7A-7P or the covering provider during after hours 7P-7A, please log into the web site [email or web address] and access using universal [place] password for that web site. If you do not have the password, please call the hospital operator.  04/27/2020, 11:40 AM

## 2020-04-27 NOTE — Care Management Important Message (Signed)
Important Message  Patient Details IM Letter given to the Patient Name: Marissa Wilkerson MRN: [id number] Date of Birth: 09/17/1960   Medicare Important Message Given:  Yes     Kerin Salen 04/27/2020, 12:27 PM

## 2020-04-28 ENCOUNTER — Ambulatory Visit: Payer: Medicare Other

## 2020-04-28 DIAGNOSIS — I5189 Other ill-defined heart diseases: Secondary | ICD-10-CM

## 2020-04-28 DIAGNOSIS — E785 Hyperlipidemia, unspecified: Secondary | ICD-10-CM

## 2020-04-28 DIAGNOSIS — I10 Essential (primary) hypertension: Secondary | ICD-10-CM

## 2020-04-28 DIAGNOSIS — D860 Sarcoidosis of lung: Secondary | ICD-10-CM

## 2020-04-28 DIAGNOSIS — I5033 Acute on chronic diastolic (congestive) heart failure: Secondary | ICD-10-CM

## 2020-04-28 LAB — CBC WITH DIFFERENTIAL/PLATELET
Abs Immature Granulocytes: 0.04 10*3/uL (ref 0.00–0.07)
Basophils Absolute: 0 10*3/uL (ref 0.0–0.1)
Basophils Relative: 0 %
Eosinophils Absolute: 0.2 10*3/uL (ref 0.0–0.5)
Eosinophils Relative: 2 %
HCT: 26.6 % — ABNORMAL LOW (ref 36.0–46.0)
Hemoglobin: 8.3 g/dL — ABNORMAL LOW (ref 12.0–15.0)
Immature Granulocytes: 1 %
Lymphocytes Relative: 7 %
Lymphs Abs: 0.5 10*3/uL — ABNORMAL LOW (ref 0.7–4.0)
MCH: 30.1 pg (ref 26.0–34.0)
MCHC: 31.2 g/dL (ref 30.0–36.0)
MCV: 96.4 fL (ref 80.0–100.0)
Monocytes Absolute: 0.8 10*3/uL (ref 0.1–1.0)
Monocytes Relative: 11 %
Neutro Abs: 5.3 10*3/uL (ref 1.7–7.7)
Neutrophils Relative %: 79 %
Platelets: 163 10*3/uL (ref 150–400)
RBC: 2.76 MIL/uL — ABNORMAL LOW (ref 3.87–5.11)
RDW: 13.6 % (ref 11.5–15.5)
WBC: 6.8 10*3/uL (ref 4.0–10.5)
nRBC: 0 % (ref 0.0–0.2)

## 2020-04-28 LAB — COMPREHENSIVE METABOLIC PANEL
ALT: 11 U/L (ref 0–44)
AST: 18 U/L (ref 15–41)
Albumin: 1.5 g/dL — ABNORMAL LOW (ref 3.5–5.0)
Alkaline Phosphatase: 55 U/L (ref 38–126)
Anion gap: 10 (ref 5–15)
BUN: 21 mg/dL — ABNORMAL HIGH (ref 6–20)
CO2: 28 mmol/L (ref 22–32)
Calcium: 7.6 mg/dL — ABNORMAL LOW (ref 8.9–10.3)
Chloride: 98 mmol/L (ref 98–111)
Creatinine, Ser: 1.15 mg/dL — ABNORMAL HIGH (ref 0.44–1.00)
GFR calc Af Amer: >60 mL/min (ref >60)
GFR calc non Af Amer: 53 mL/min — ABNORMAL LOW (ref >60)
Glucose, Bld: 98 mg/dL (ref 70–99)
Potassium: 3.2 mmol/L — ABNORMAL LOW (ref 3.5–5.1)
Sodium: 136 mmol/L (ref 135–145)
Total Bilirubin: 0.5 mg/dL (ref 0.3–1.2)
Total Protein: 5.5 g/dL — ABNORMAL LOW (ref 6.5–8.1)

## 2020-04-28 LAB — GLUCOSE, CAPILLARY
Glucose-Capillary: 88 mg/dL (ref 70–99)
Glucose-Capillary: 112 mg/dL — ABNORMAL HIGH (ref 70–99)
Glucose-Capillary: 88 mg/dL (ref 70–99)
Glucose-Capillary: 105 mg/dL — ABNORMAL HIGH (ref 70–99)

## 2020-04-28 LAB — MAGNESIUM: Magnesium: 1.7 mg/dL (ref 1.7–2.4)

## 2020-04-28 MED ORDER — POTASSIUM CHLORIDE CRYS ER 20 MEQ PO TBCR
40.0000 meq | EXTENDED_RELEASE_TABLET | Freq: Two times a day (BID) | ORAL | Status: DC
  Administered 2020-04-28 – 2020-05-01 (×8): 40 meq via ORAL
  Filled 2020-04-28 (×8): qty 2

## 2020-04-28 MED ORDER — METOLAZONE 2.5 MG PO TABS
2.5000 mg | ORAL_TABLET | Freq: Once | ORAL | Status: AC
  Administered 2020-04-28: 2.5 mg via ORAL
  Filled 2020-04-28: qty 1

## 2020-04-28 MED FILL — DONEPEZIL HCL 10 MG TABLET: 10 | 30 days supply | Qty: 30 | Fill #2

## 2020-04-28 NOTE — Evaluation (Signed)
Occupational Therapy Evaluation Patient Details Name: Marissa Wilkerson MRN: [id number] DOB: July 08, 1961 Today's Date: 04/28/2020    History of Present Illness 59 y.o. female with medical history significant of diabetes, chronic diastolic heart failure, sarcoidosis -oxygen dependent, CHF, DM, BPPV, obesity and admitted for Acute on chronic diastolic congestive heart failure with anasarca   Clinical Impression   Mrs. Marissa Wilkerson is a 59 year old woman who presents with generalized weakness, decreased activity tolerance, poor balance, edematous lower extremities and obesity resulting in decreased ability to perform baseline functional mobility and participation in ADLs. Patient max assist to transfer to side of bed and total assist (+2) to return to supine. Patient able to tolerate a couple of minutes at edge of bed while kicking lower extremities and performing ankle pumps to promote edema reduction. Patient reports feeling like she is falling backwards and therapist maintained hand on patient's back for encouragement and to reduce patient's fear. Patient leaning to the left at edge of bed and propping on left arm though she can take hands off of the bed. Patient positions herself to the left in supine as well. Patient exhibits significant weakness in upper extremities limiting her ability to compensate for LE edema/wealmess. Patient will benefit from skilled OT services to improve deficits and learn compensatory strategies as needed in order to improve deficits and reduce caregiver burden. Patient will need short term rehab at discharge.    Follow Up Recommendations  SNF    Equipment Recommendations  None recommended by OT    Recommendations for Other Services       Precautions / Restrictions Precautions Precautions: Fall Precaution Comments: 3.5 L O2 [place] baseline Restrictions Weight Bearing Restrictions: No      Mobility Bed Mobility Overal bed mobility: Needs Assistance Bed Mobility: Sit  to Supine;Supine to Sit     Supine to sit: HOB elevated;Max assist Sit to supine: Total assist;+2 for safety/equipment   General bed mobility comments: Patient able to assist with movement of legs toward edge of bed - but limited due to excessive edema. Max assist for trunk lift off with use of elevated HOB to transfer patient into sitting. Total assist with +2 assistance to return to supine and pull patient up in bed.  Transfers                 General transfer comment: unable to stand. Patient too fearful to attempt.    Balance Overall balance assessment: Needs assistance Sitting-balance support: No upper extremity supported;Feet supported Sitting balance-Leahy Scale: Fair Sitting balance - Comments: Can remove hands from bed but has a tendency to lean to her left and prop on left arm.                                   ADL either performed or assessed with clinical judgement   ADL Overall ADL's : Needs assistance/impaired Eating/Feeding: Set up;Bed level   Grooming: Set up;Bed level   Upper Body Bathing: Maximal assistance;Bed level;Set up   Lower Body Bathing: Total assistance;+2 for physical assistance;Bed level   Upper Body Dressing : Maximal assistance;Bed level   Lower Body Dressing: Total assistance;+2 for physical assistance;Bed level     Toilet Transfer Details (indicate cue type and reason): unable Toileting- Clothing Manipulation and Hygiene: Total assistance;+2 for physical assistance;Bed level               Vision   Vision Assessment?:  No apparent visual deficits     Perception     Praxis      Pertinent Vitals/Pain Pain Assessment: No/denies pain Faces Pain Scale: Hurts little more Pain Location: chronic back pain Pain Intervention(s): Repositioned;Monitored during session     Hand Dominance Right   Extremity/Trunk Assessment Upper Extremity Assessment Upper Extremity Assessment: Generalized weakness   Lower  Extremity Assessment Lower Extremity Assessment: Defer to PT evaluation       Communication Communication Communication: No difficulties   Cognition Arousal/Alertness: Awake/alert Behavior During Therapy: WFL for tasks assessed/performed Overall Cognitive Status: Within Functional Limits for tasks assessed                                     General Comments       Exercises     Shoulder Instructions      Home Living Family/patient expects to be discharged to:: Skilled nursing facility Living Arrangements: Spouse/significant other Available Help at Discharge: Family Type of Home: House Home Access: Ramped entrance     Home Layout: One level               Home Equipment: Environmental consultant - 2 wheels;Wheelchair - [employer];Tub bench          Prior Functioning/Environment Level of Independence: Needs assistance  Gait / Transfers Assistance Needed: mostly transfers to w/c, husband has been assisting with mobility lately and reports mobility has been steadily declining ADL's / Homemaking Assistance Needed: husband assist with ADLs. Patient's husband dresses patient and bathes patient in the bed due to recent decline. husband helps patient stand and transfer to [facility]            OT Problem List: Decreased strength;Decreased activity tolerance;Impaired balance (sitting and/or standing);Decreased knowledge of use of DME or AE;Obesity;Increased edema      OT Treatment/Interventions: Self-care/ADL training;Therapeutic exercise;DME and/or AE instruction;Therapeutic activities;Balance training;Patient/family education;Manual therapy    OT Goals(Current goals can be found in the care plan section) Acute Rehab OT Goals Patient Stated Goal: to reduce edema OT Goal Formulation: With patient/family Time For Goal Achievement: 05/12/20 Potential to Achieve Goals: Fair  OT Frequency: Min 2X/week   Barriers to D/C:            Co-evaluation               AM-PAC OT "6 Clicks" Daily Activity     Outcome Measure Help from another person eating meals?: A Little Help from another person taking care of personal grooming?: A Little Help from another person toileting, which includes using toliet, bedpan, or urinal?: Total Help from another person bathing (including washing, rinsing, drying)?: A Lot Help from another person to put on and taking off regular upper body clothing?: A Lot Help from another person to put on and taking off regular lower body clothing?: Total 6 Click Score: 12   End of Session Equipment Utilized During Treatment: Oxygen Nurse Communication:  (okay to see per RN)  Activity Tolerance: Patient limited by fatigue Patient left: in bed;with call bell/phone within reach;with bed alarm set;with family/visitor present  OT Visit Diagnosis: Muscle weakness (generalized) (M62.81);History of falling (Z91.81)                Time: 1550-1610 OT Time Calculation (min): 20 min Charges:  OT General Charges $OT Visit: 1 Visit OT Evaluation $OT Eval Moderate Complexity: 1 Mod  Prim Morace, OTR/L Acute Care Rehab Services  Office [phone] Pager: [place] 04/28/2020, 5:24 PM

## 2020-04-28 NOTE — Progress Notes (Signed)
PROGRESS NOTE  Marissa Wilkerson  WRU:[id number] DOB: 1960-09-12 DOA: 04/24/2020 PCP: Jani Gravel, MD  Outpatient Specialists: Cardiology, Dr. Gwenlyn Found Brief Narrative: Marissa Wilkerson is a 59 y.o. female with a history of pulmonary sarcoidosis, chronic hypoxic respiratory failure on 3.5LPM, chronic HFpEF, HTN, IDT2DM, LLE DVT, seizure disorder, reported dementia, and anemia of chronic disease who presented to [facility]28 due to acute exacerbation of CHF. CXR revealed diffuse bilateral interstitial opacities consistent with pulmonary edema and left pleural effusion with cardiomegaly. Imaging also suggestive of new diagnosis hepatic cirrhosis.   Assessment & Plan: Principal Problem:   CHF exacerbation ([place]) Active Problems:   Hypertension   Diabetes mellitus without complication ([place])   Diastolic dysfunction   Sarcoidosis of lung ([facility])   Hyperlipidemia   CHF (congestive heart failure), NYHA class I, unspecified failure chronicity, diastolic ([place])   History of seizure  Acute on chronic HFpEF with anasarca: Echo 8/29 with LVEF 60-65%, indeterminate diastolic parameters, no WMA's, normal RV, mild-moderate AS mean gradient 71mmHg (unchanged from report of echo Jan 2019). Exacerbated by hypoalbuminemia likely related to cirrhosis. BNP 264.5.  - Continue lasix 60mg  IV BID.  - Only 550cc charted UOP/24hrs, add metolazone x1 today.  - Continue newly added spironolactone. Will plan to increase dose depending on diuresis, renal function. - Continue daily weights. Mobility precludes standing weights, bed weights stable at 253lbs, up from prior EDW of 240lbs.   Chronic hypoxemic respiratory failure due to pulmonary sarcoidosis with acute pulmonary edema: Off MTX since 2018.  - Follow up with pulmonology, Dr. Lamonte Sakai, as outpatient.  Hepatic cirrhosis: ?If related to sarcoid.  - INR 1.2, albumin 1.5, TBili 0.3.  - Check hepatitis B Ag, Ab, hepatitis C Ab, HIV (NR 2020), ferritin, ceruloplasmin, antimitochondrial  Ab's - Continue lasix, spironolactone.  - Na restriction - Low threshold for lactulose. Ammonia initially elevated to 38, down to 25.[name]  Hypokalemia:  - Supplement with ongoing loop diuretic, metolazone. Monitor in AM.   Seizure disorder:  - Continue vimpat, continue [facility] Neurology follow up as outpatient  IDT2DM: HbA1c 4.6%, possibly developing impairment in gluconeogenesis.  - DC SSI  HTN crisis: POA, resolved.  - Continue antihypertensive regimen and volume removal.   Dementia, psychosis?:  - Continue home medications aricept and prozac.    Staphylococcus capitis in 1 blood culture: Suspected contaminant.  - Monitor clinically without targeted Tx.  E. coli UTI:  - Completed Tx w/ceftriaxone  Obesity: Estimated body mass index is 44.95 kg/m as calculated from the following:   Height as of this encounter: 5\' 3"  (1.6 m).   Weight as of this encounter: 115.1 kg.   Breast mass: Exam benign per previous hospitalist.  - OP mammogram per routine.   DVT prophylaxis: Lovenox Code Status: Full Family Communication: Husband by phone this evening Disposition Plan:  Status is: Inpatient  Remains inpatient appropriate because:IV treatments appropriate due to intensity of illness or inability to take PO  Dispo: The patient is from: Home              Anticipated d/c is to: SNF              Anticipated d/c date is: 2 days              Patient currently is not medically stable to d/c.  Consultants:   None  Procedures:   None  Antimicrobials:  Ceftriaxone 8/28 - 8/30   Subjective: Feels more puffy than previously. Came in initially for nausea and  vomiting which have improved.   Objective: Vitals:   04/27/20 2121 04/28/20 0234 04/28/20 0553 04/28/20 1215  BP: (!) 148/59  (!) 156/84 139/60  Pulse: 61  77 62  Resp: 20  20 17   Temp: 97.6 F (36.4 C)  97.7 F (36.5 C) 97.6 F (36.4 C)  TempSrc: Oral  Oral Oral  SpO2: 100%  98% 100%  Weight:  115.1 kg    Height:          Intake/Output Summary (Last 24 hours) at 04/28/2020 1823 Last data filed at 04/28/2020 1231 Gross per 24 hour  Intake 840 ml  Output 550 ml  Net 290 ml   Filed Weights   04/26/20 0502 04/27/20 0612 04/28/20 0234  Weight: 114.8 kg 115.1 kg 115.1 kg    Gen: 59 y.o. female in no distress  Pulm: Non-labored breathing 3.5L. Clear to auscultation bilaterally.  CV: Regular rate and rhythm. No murmur, rub, or gallop. No definite JVD, diffuse pitting edema. GI: Abdomen soft, protuberant, non-tender, non-distended, with normoactive bowel sounds. No organomegaly or masses felt. Ext: Warm, no deformities Skin: No rashes, lesions or ulcers Neuro: Alert and oriented. No focal neurological deficits. Psych: Judgement and insight appear normal. Mood & affect appropriate.   Data Reviewed: I have personally reviewed following labs and imaging studies  CBC: Recent Labs  Lab 04/24/20 1349 04/25/20 0527 04/26/20 1011 04/27/20 0833 04/28/20 0721  WBC 15.5* 10.7* 9.4 6.2 6.8  NEUTROABS 13.6*  --  7.2 4.7 5.3  HGB 9.5* 8.9* 8.8* 8.0* 8.3*  HCT 29.0* 28.8* 27.6* 26.2* 26.6*  MCV 92.9 95.0 95.8 97.4 96.4  PLT 236 210 209 148* 846   Basic Metabolic Panel: Recent Labs  Lab 04/24/20 1349 04/25/20 0527 04/26/20 1011 04/27/20 0833 04/28/20 0721  NA 139 139 137 137 136  K 3.9 3.0* 3.4* 3.2* 3.2*  CL 102 102 101 98 98  CO2 28 30 31 31 28   GLUCOSE 113* 95 99 95 98  BUN 21* 21* 19 21* 21*  CREATININE 1.34* 1.21* 1.07* 1.23* 1.15*  CALCIUM 7.5* 7.7* 7.4* 7.3* 7.6*  MG  --  1.8 1.5*  --  1.7   GFR: Estimated Creatinine Clearance: 66 mL/min (A) (by C-G formula based on SCr of 1.15 mg/dL (H)). Liver Function Tests: Recent Labs  Lab 04/24/20 1349 04/25/20 0527 04/26/20 1011 04/27/20 0833 04/28/20 0721  AST 24 24 19 16 18   ALT 11 11 10 10 11   ALKPHOS 76 74 63 56 55  BILITOT 0.8 0.5 0.3 0.2* 0.5  PROT 6.6 6.8 6.0* 5.3* 5.5*  ALBUMIN 1.6* 1.7* 1.5* 1.4* 1.5*   No results for input(s):  LIPASE, AMYLASE in the last 168 hours. Recent Labs  Lab 04/24/20 1349 04/27/20 0833  AMMONIA 38* 25   Coagulation Profile: Recent Labs  Lab 04/24/20 1349  INR 1.2   Cardiac Enzymes: No results for input(s): CKTOTAL, CKMB, CKMBINDEX, TROPONINI in the last 168 hours. BNP (last 3 results) No results for input(s): PROBNP in the last 8760 hours. HbA1C: No results for input(s): HGBA1C in the last 72 hours. CBG: Recent Labs  Lab 04/27/20 1645 04/27/20 2119 04/28/20 0847 04/28/20 1212 04/28/20 1632  GLUCAP 103* 95 88 112* 88   Lipid Profile: No results for input(s): CHOL, HDL, LDLCALC, TRIG, CHOLHDL, LDLDIRECT in the last 72 hours. Thyroid Function Tests: No results for input(s): TSH, T4TOTAL, FREET4, T3FREE, THYROIDAB in the last 72 hours. Anemia Panel: No results for input(s): VITAMINB12, FOLATE, FERRITIN, TIBC, IRON, RETICCTPCT  in the last 72 hours. Urine analysis:    Component Value Date/Time   COLORURINE AMBER (A) 04/24/2020 1407   APPEARANCEUR CLOUDY (A) 04/24/2020 1407   LABSPEC 1.017 04/24/2020 1407   PHURINE 5.0 04/24/2020 1407   GLUCOSEU NEGATIVE 04/24/2020 1407   HGBUR MODERATE (A) 04/24/2020 1407   BILIRUBINUR NEGATIVE 04/24/2020 1407   KETONESUR NEGATIVE 04/24/2020 1407   PROTEINUR >=300 (A) 04/24/2020 1407   UROBILINOGEN 1.0 03/03/2014 0942   NITRITE NEGATIVE 04/24/2020 1407   LEUKOCYTESUR MODERATE (A) 04/24/2020 1407   Recent Results (from the past 240 hour(s))  Urine culture     Status: Abnormal   Collection Time: 04/24/20  2:07 PM   Specimen: Urine, Random  Result Value Ref Range Status   Specimen Description   Final    URINE, RANDOM Performed at [facility], [place] [address], [place], [place] [address]    Special Requests   Final    NONE Performed at [facility], [place] [address], [place], [place] [address]    Culture >=100,000 COLONIES/mL ESCHERICHIA COLI (A)  Final   Report Status 04/26/2020 FINAL  Final     Organism ID, Bacteria ESCHERICHIA COLI (A)  Final      Susceptibility   Escherichia coli - MIC*    AMPICILLIN >=32 RESISTANT Resistant     CEFAZOLIN <=4 SENSITIVE Sensitive     CEFTRIAXONE <=0.25 SENSITIVE Sensitive     CIPROFLOXACIN >=4 RESISTANT Resistant     GENTAMICIN <=1 SENSITIVE Sensitive     IMIPENEM <=0.25 SENSITIVE Sensitive     NITROFURANTOIN <=16 SENSITIVE Sensitive     TRIMETH/SULFA >=320 RESISTANT Resistant     AMPICILLIN/SULBACTAM >=32 RESISTANT Resistant     PIP/TAZO 8 SENSITIVE Sensitive     * >=100,000 COLONIES/mL ESCHERICHIA COLI  SARS Coronavirus 2 by RT PCR (hospital order, performed in [place] hospital lab) Nasopharyngeal Nasopharyngeal Swab     Status: None   Collection Time: 04/24/20  3:33 PM   Specimen: Nasopharyngeal Swab  Result Value Ref Range Status   SARS Coronavirus 2 NEGATIVE NEGATIVE Final    Comment: (NOTE) SARS-CoV-2 target nucleic acids are NOT DETECTED.  The SARS-CoV-2 RNA is generally detectable in upper and lower respiratory specimens during the acute phase of infection. The lowest concentration of SARS-CoV-2 viral copies this assay can detect is 250 copies / mL. A negative result does not preclude SARS-CoV-2 infection and should not be used as the sole basis for treatment or other patient management decisions.  A negative result may occur with improper specimen collection / handling, submission of specimen other than nasopharyngeal swab, presence of viral mutation(s) within the areas targeted by this assay, and inadequate number of viral copies (<250 copies / mL). A negative result must be combined with clinical observations, patient history, and epidemiological information.  Fact Sheet for Patients:   StrictlyIdeas.no  Fact Sheet for Healthcare Providers: BankingDealers.co.za  This test is not yet approved or  cleared by the [place] FDA and has been authorized for detection  and/or diagnosis of SARS-CoV-2 by FDA under an Emergency Use Authorization (EUA).  This EUA will remain in effect (meaning this test can be used) for the duration of the COVID-19 declaration under Section 564(b)(1) of the Act, 21 U.S.C. section 360bbb-3(b)(1), unless the authorization is terminated or revoked sooner.  Performed at [facility], [place] [address], [place],  [address]   Blood culture (routine x 2)     Status: Abnormal  Collection Time: 04/24/20  4:47 PM   Specimen: BLOOD  Result Value Ref Range Status   Specimen Description   Final    BLOOD RIGHT ANTECUBITAL Performed at [place] [address], [place], [place] [address]    Special Requests   Final    BOTTLES DRAWN AEROBIC AND ANAEROBIC Blood Culture adequate volume Performed at [facility] [address], [place], [place] [address]    Culture  Setup Time   Final    GRAM POSITIVE COCCI IN CLUSTERS AEROBIC BOTTLE ONLY Organism ID to follow CRITICAL RESULT CALLED TO, READ BACK BY AND VERIFIED WITH: D. WOFFORD PHARMD, AT [id number] 04/25/20 BY D. VANHOOK    Culture (A)  Final    STAPHYLOCOCCUS CAPITIS THE SIGNIFICANCE OF ISOLATING THIS ORGANISM FROM A SINGLE SET OF BLOOD CULTURES WHEN MULTIPLE SETS ARE DRAWN IS UNCERTAIN. PLEASE NOTIFY THE MICROBIOLOGY DEPARTMENT WITHIN ONE WEEK IF SPECIATION AND SENSITIVITIES ARE REQUIRED. Performed at [facility], [place] [address], [place], [place] [address]    Report Status 04/27/2020 FINAL  Final  Blood Culture ID Panel (Reflexed)     Status: Abnormal   Collection Time: 04/24/20  4:47 PM  Result Value Ref Range Status   Enterococcus faecalis NOT DETECTED NOT DETECTED Final   Enterococcus Faecium NOT DETECTED NOT DETECTED Final   Listeria monocytogenes NOT DETECTED NOT DETECTED Final   Staphylococcus species DETECTED (A) NOT DETECTED Final    Comment: CRITICAL RESULT CALLED TO, READ BACK BY AND VERIFIED  WITH: D. WOFFORD PHARMD, AT 1758 04/25/20 BY D. VANHOOK    Staphylococcus aureus (BCID) NOT DETECTED NOT DETECTED Final   Staphylococcus epidermidis NOT DETECTED NOT DETECTED Final   Staphylococcus lugdunensis NOT DETECTED NOT DETECTED Final   Streptococcus species NOT DETECTED NOT DETECTED Final   Streptococcus agalactiae NOT DETECTED NOT DETECTED Final   Streptococcus pneumoniae NOT DETECTED NOT DETECTED Final   Streptococcus pyogenes NOT DETECTED NOT DETECTED Final   A.calcoaceticus-baumannii NOT DETECTED NOT DETECTED Final   Bacteroides fragilis NOT DETECTED NOT DETECTED Final   Enterobacterales NOT DETECTED NOT DETECTED Final   Enterobacter cloacae complex NOT DETECTED NOT DETECTED Final   Escherichia coli NOT DETECTED NOT DETECTED Final   Klebsiella aerogenes NOT DETECTED NOT DETECTED Final   Klebsiella oxytoca NOT DETECTED NOT DETECTED Final   Klebsiella pneumoniae NOT DETECTED NOT DETECTED Final   Proteus species NOT DETECTED NOT DETECTED Final   Salmonella species NOT DETECTED NOT DETECTED Final   Serratia marcescens NOT DETECTED NOT DETECTED Final   Haemophilus influenzae NOT DETECTED NOT DETECTED Final   Neisseria meningitidis NOT DETECTED NOT DETECTED Final   Pseudomonas aeruginosa NOT DETECTED NOT DETECTED Final   Stenotrophomonas maltophilia NOT DETECTED NOT DETECTED Final   Candida albicans NOT DETECTED NOT DETECTED Final   Candida auris NOT DETECTED NOT DETECTED Final   Candida glabrata NOT DETECTED NOT DETECTED Final   Candida krusei NOT DETECTED NOT DETECTED Final   Candida parapsilosis NOT DETECTED NOT DETECTED Final   Candida tropicalis NOT DETECTED NOT DETECTED Final   Cryptococcus neoformans/gattii NOT DETECTED NOT DETECTED Final    Comment: Performed at [facility], [address], [place],  [address]  Blood culture (routine x 2)     Status: None (Preliminary result)   Collection Time: 04/24/20  5:04 PM   Specimen: BLOOD  Result Value Ref Range  Status   Specimen Description   Final    BLOOD BLOOD LEFT FOREARM Performed  at [facility], [place] [address], [place], [place] [address]    Special Requests   Final    BOTTLES DRAWN AEROBIC AND ANAEROBIC Blood Culture adequate volume Performed at [place] [address] [place], [place] [address]    Culture   Final    NO GROWTH 4 DAYS Performed at [facility], [place] [address] [place], [place] [address]    Report Status PENDING  Incomplete      Radiology Studies: No results found.  Scheduled Meds: . atorvastatin  20 mg Oral Daily  . carvedilol  6.25 mg Oral BID WC  . donepezil  10 mg Oral Daily  . enoxaparin (LOVENOX) injection  60 mg Subcutaneous Q24H  . FLUoxetine  10 mg Oral Daily  . furosemide  60 mg Intravenous BID  . lacosamide  200 mg Oral BID  . losartan  50 mg Oral Daily  . metoCLOPramide  5 mg Oral TID AC  . potassium chloride  40 mEq Oral BID  . spironolactone  12.5 mg Oral Daily   Continuous Infusions:   LOS: 4 days   Time spent: 25 minutes.  Patrecia Pour, MD [facility] [email or web address] 04/28/2020, 6:23 PM

## 2020-04-28 NOTE — Evaluation (Signed)
Physical Therapy Evaluation Patient Details Name: Marissa Wilkerson MRN: [id number] DOB: 04-17-1961 Today's Date: 04/28/2020   History of Present Illness  59 y.o. female with medical history significant of diabetes, chronic diastolic heart failure, sarcoidosis -oxygen dependent, CHF, DM, BPPV, obesity and admitted for Acute on chronic diastolic congestive heart failure with anasarca  Clinical Impression  Pt admitted with above diagnosis.  Pt currently with functional limitations due to the deficits listed below (see PT Problem List). Pt will benefit from skilled PT to increase their independence and safety with mobility to allow discharge to the venue listed below.  Pt reports fearful of falling (last fall in April).  Spouse has been assisting pt with transfers to w/c at home and reports mobility steadily declining.  Pt plans to d/c to SNF for rehab.  Pt assisted to sitting EOB however reported dizziness and not able to tolerate so assisted back to supine.  Pt plans to progress mobility slowly as she reports increased weakness and assist required at home, also very fearful of falls.     Follow Up Recommendations SNF    Equipment Recommendations  None recommended by PT    Recommendations for Other Services       Precautions / Restrictions Precautions Precautions: Fall Precaution Comments: 3.5 L O2 [place] baseline      Mobility  Bed Mobility Overal bed mobility: Needs Assistance Bed Mobility: Supine to Sit;Sit to Supine     Supine to sit: Max assist;+2 for physical assistance Sit to supine: Max assist;+2 for physical assistance   General bed mobility comments: pt able to move feet towards EOB however reports requiring assist for further bed mobility, initiated trunk descent to bed; assist mainly required for trunk and scooting; utilized bed pads for positioning; pt reports dizziness sitting EOB and requested return to supine after approx 2 minutes  Transfers                 General  transfer comment: pt declined  Ambulation/Gait                Stairs            Wheelchair Mobility    Modified Rankin (Stroke Patients Only)       Balance Overall balance assessment: Needs assistance Sitting-balance support: Feet supported;No upper extremity supported Sitting balance-Leahy Scale: Fair                                       Pertinent Vitals/Pain Pain Assessment: Faces Faces Pain Scale: Hurts little more Pain Location: chronic back pain Pain Intervention(s): Repositioned;Monitored during session    [place] expects to be discharged to:: Skilled nursing facility Living Arrangements: Spouse/significant other Available Help at Discharge: Family Type of Home: House Home Access: Ramped entrance     Home Layout: One level Home Equipment: Environmental consultant - 2 wheels;Wheelchair - [employer];Tub bench      Prior Function Level of Independence: Needs assistance   Gait / Transfers Assistance Needed: mostly transfers to w/c, husband has been assisting with mobility lately and reports mobility has been steadily declining  ADL's / Homemaking Assistance Needed: husband assist with ADLs        Hand Dominance        Extremity/Trunk Assessment   Upper Extremity Assessment Upper Extremity Assessment: Generalized weakness    Lower Extremity Assessment Lower Extremity Assessment: Generalized weakness (edematous LEs, weeping)  Communication   Communication: No difficulties  Cognition Arousal/Alertness: Awake/alert Behavior During Therapy: WFL for tasks assessed/performed Overall Cognitive Status: Within Functional Limits for tasks assessed                                        General Comments      Exercises     Assessment/Plan    PT Assessment Patient needs continued PT services  PT Problem List Decreased strength;Decreased mobility;Decreased balance;Decreased knowledge of  use of DME;Decreased activity tolerance       PT Treatment Interventions DME instruction;Therapeutic exercise;Balance training;Wheelchair mobility training;Functional mobility training;Therapeutic activities;Patient/family education    PT Goals (Current goals can be found in the Care Plan section)  Acute Rehab PT Goals PT Goal Formulation: With patient Time For Goal Achievement: 05/12/20 Potential to Achieve Goals: Fair    Frequency Min 2X/week   Barriers to discharge        Co-evaluation               AM-PAC PT "6 Clicks" Mobility  Outcome Measure Help needed turning from your back to your side while in a flat bed without using bedrails?: A Lot Help needed moving from lying on your back to sitting on the side of a flat bed without using bedrails?: A Lot Help needed moving to and from a bed to a chair (including a wheelchair)?: Total Help needed standing up from a chair using your arms (e.g., wheelchair or bedside chair)?: Total Help needed to walk in hospital room?: Total Help needed climbing 3-5 steps with a railing? : Total 6 Click Score: 8    End of Session Equipment Utilized During Treatment: Oxygen Activity Tolerance: Patient tolerated treatment well Patient left: in bed;with call bell/phone within reach;with bed alarm set;with family/visitor present   PT Visit Diagnosis: Muscle weakness (generalized) (M62.81);Other abnormalities of gait and mobility (R26.89)    Time: 1444-1500 PT Time Calculation (min) (ACUTE ONLY): 16 min   Charges:   PT Evaluation $PT Eval Low Complexity: 1 Low         Kati PT, DPT Acute Rehabilitation Services Pager: [phone] Office: [phone]   York Ram E 04/28/2020, 4:30 PM

## 2020-04-29 LAB — PROTEIN / CREATININE RATIO, URINE
Creatinine, Urine: 67.44 mg/dL
Total Protein, Urine: <6 mg/dL

## 2020-04-29 LAB — CBC WITH DIFFERENTIAL/PLATELET
Abs Immature Granulocytes: 0.07 10*3/uL (ref 0.00–0.07)
Basophils Absolute: 0 10*3/uL (ref 0.0–0.1)
Basophils Relative: 0 %
Eosinophils Absolute: 0.1 10*3/uL (ref 0.0–0.5)
Eosinophils Relative: 1 %
HCT: 25.2 % — ABNORMAL LOW (ref 36.0–46.0)
Hemoglobin: 8 g/dL — ABNORMAL LOW (ref 12.0–15.0)
Immature Granulocytes: 1 %
Lymphocytes Relative: 8 %
Lymphs Abs: 0.6 10*3/uL — ABNORMAL LOW (ref 0.7–4.0)
MCH: 30.4 pg (ref 26.0–34.0)
MCHC: 31.7 g/dL (ref 30.0–36.0)
MCV: 95.8 fL (ref 80.0–100.0)
Monocytes Absolute: 0.9 10*3/uL (ref 0.1–1.0)
Monocytes Relative: 12 %
Neutro Abs: 5.6 10*3/uL (ref 1.7–7.7)
Neutrophils Relative %: 78 %
Platelets: 150 10*3/uL (ref 150–400)
RBC: 2.63 MIL/uL — ABNORMAL LOW (ref 3.87–5.11)
RDW: 13.7 % (ref 11.5–15.5)
WBC: 7.2 10*3/uL (ref 4.0–10.5)
nRBC: 0 % (ref 0.0–0.2)

## 2020-04-29 LAB — COMPREHENSIVE METABOLIC PANEL
ALT: 11 U/L (ref 0–44)
AST: 17 U/L (ref 15–41)
Albumin: 1.5 g/dL — ABNORMAL LOW (ref 3.5–5.0)
Alkaline Phosphatase: 56 U/L (ref 38–126)
Anion gap: 7 (ref 5–15)
BUN: 22 mg/dL — ABNORMAL HIGH (ref 6–20)
CO2: 30 mmol/L (ref 22–32)
Calcium: 7.6 mg/dL — ABNORMAL LOW (ref 8.9–10.3)
Chloride: 98 mmol/L (ref 98–111)
Creatinine, Ser: 1.16 mg/dL — ABNORMAL HIGH (ref 0.44–1.00)
GFR calc Af Amer: >60 mL/min (ref >60)
GFR calc non Af Amer: 52 mL/min — ABNORMAL LOW (ref >60)
Glucose, Bld: 96 mg/dL (ref 70–99)
Potassium: 3.7 mmol/L (ref 3.5–5.1)
Sodium: 135 mmol/L (ref 135–145)
Total Bilirubin: 0.3 mg/dL (ref 0.3–1.2)
Total Protein: 5.3 g/dL — ABNORMAL LOW (ref 6.5–8.1)

## 2020-04-29 LAB — GLUCOSE, CAPILLARY
Glucose-Capillary: 90 mg/dL (ref 70–99)
Glucose-Capillary: 104 mg/dL — ABNORMAL HIGH (ref 70–99)
Glucose-Capillary: 106 mg/dL — ABNORMAL HIGH (ref 70–99)
Glucose-Capillary: 103 mg/dL — ABNORMAL HIGH (ref 70–99)

## 2020-04-29 LAB — HEPATITIS C ANTIBODY: HCV Ab: NONREACTIVE

## 2020-04-29 LAB — CULTURE, BLOOD (ROUTINE X 2)
Culture: NO GROWTH
Special Requests: ADEQUATE

## 2020-04-29 LAB — FERRITIN: Ferritin: 284 ng/mL (ref 11–307)

## 2020-04-29 LAB — HIV ANTIBODY (ROUTINE TESTING W REFLEX): HIV Screen 4th Generation wRfx: NONREACTIVE

## 2020-04-29 LAB — HCV RNA QUANT: HCV Quantitative: NOT DETECTED IU/mL (ref >50)

## 2020-04-29 LAB — HEPATITIS B SURFACE ANTIGEN: Hepatitis B Surface Ag: NONREACTIVE

## 2020-04-29 MED ORDER — ALBUMIN HUMAN 25 % IV SOLN
25.0000 g | Freq: Once | INTRAVENOUS | Status: AC
  Administered 2020-04-29: 25 g via INTRAVENOUS
  Filled 2020-04-29: qty 100

## 2020-04-29 MED ORDER — SPIRONOLACTONE 25 MG PO TABS
100.0000 mg | ORAL_TABLET | Freq: Every day | ORAL | Status: DC
  Administered 2020-04-30 – 2020-05-01 (×2): 100 mg via ORAL
  Filled 2020-04-29 (×2): qty 4

## 2020-04-29 MED ORDER — SPIRONOLACTONE 25 MG PO TABS
75.0000 mg | ORAL_TABLET | Freq: Once | ORAL | Status: AC
  Administered 2020-04-29: 75 mg via ORAL
  Filled 2020-04-29: qty 3

## 2020-04-29 MED ORDER — FUROSEMIDE 10 MG/ML IJ SOLN
80.0000 mg | Freq: Two times a day (BID) | INTRAMUSCULAR | Status: DC
  Administered 2020-04-29 – 2020-05-01 (×4): 80 mg via INTRAVENOUS
  Filled 2020-04-29 (×4): qty 8

## 2020-04-29 NOTE — Progress Notes (Signed)
PROGRESS NOTE  Marissa Wilkerson  XBD:[id number] DOB: 07-18-1961 DOA: 04/24/2020 PCP: Jani Gravel, MD  Outpatient Specialists: Cardiology, Dr. Gwenlyn Found Brief Narrative: Marissa Wilkerson is a 59 y.o. 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 CXR revealed diffuse bilateral interstitial opacities consistent with pulmonary edema and left pleural effusion with cardiomegaly. Imaging also suggestive of new diagnosis hepatic cirrhosis.   Assessment & Plan: Principal Problem:   CHF exacerbation ([place]) Active Problems:   Hypertension   Diabetes mellitus without complication ([place])   Diastolic dysfunction   Sarcoidosis of lung ([facility])   Hyperlipidemia   CHF (congestive heart failure), NYHA class I, unspecified failure chronicity, diastolic ([place])   History of seizure  Acute on chronic HFpEF with anasarca: Echo 8/29 with LVEF 60-65%, indeterminate diastolic parameters, no WMA's, normal RV, mild-moderate AS mean gradient 54mmHg (unchanged from report of echo Jan 2019). Exacerbated by hypoalbuminemia likely related to cirrhosis. BNP 264.5.  - Continue lasix, not a significant diuresis. Will increase to 80mg  IV BID, add albumin x1 today, increase spironolactone.  - Continue newly added spironolactone. Will plan to increase dose to 100mg . - Continue daily weights. Mobility precludes standing weights, bed weights stable, remaining up from prior EDW of 240lbs.   Chronic hypoxemic respiratory failure due to pulmonary sarcoidosis with acute pulmonary edema: Off MTX since 2018. - Follow up with pulmonology, Dr. Lamonte Sakai, as outpatient.  Hepatic cirrhosis: ?If related to sarcoid.  - INR 1.2, albumin 1.5, TBili 0.3.  - Check serologies (sent today) HCV Ab neg, HIV NR. Others pending. - Continue lasix,  spironolactone as above - Na restriction - Low threshold for lactulose. Ammonia initially elevated to 38, down to 25. - Consider paracentesis if develops abdominal discomfort.  Hypoalbuminemia: Impaired synthetic function and proteinuria contributing. - Quantify proteinuria. Noted to have proteinuria dating to 2019.   Hypokalemia:  - Supplement with ongoing loop diuretic, metolazone. Monitor in AM.   Seizure disorder:  - Continue vimpat, continue [facility] follow up as outpatient  IDT2DM: HbA1c 4.6%, possibly developing impairment in gluconeogenesis.  - DC SSI  HTN crisis: POA, resolved.  - Continue antihypertensive regimen and volume removal.   Dementia, psychosis?:  - Continue home medications aricept and prozac.    Staphylococcus capitis in 1 blood culture: Suspected contaminant.  - Monitor clinically without targeted Tx.  E. coli UTI:  - Completed Tx w/ceftriaxone  Obesity: Estimated body mass index is 44.68 kg/m as calculated from the following:   Height as of this encounter: 5\' 3"  (1.6 m).   Weight as of this encounter: 114.4 kg.   Breast mass: Exam benign per previous hospitalist.  - OP mammogram per routine.   DVT prophylaxis: Lovenox Code Status: Full Family Communication: Husband by phone daily Disposition Plan:  Status is: Inpatient  Remains inpatient appropriate because:IV treatments appropriate due to intensity of illness or inability to take PO. Diffuse volume overload refractory to IV diuresis.   Dispo: The patient is from: Home              Anticipated d/c is to: SNF              Anticipated d/c date is: 2 days              Patient currently is not medically stable to d/c.  Consultants:  None  Procedures:   None  Antimicrobials:  Ceftriaxone 8/28 - 8/30   Subjective: Does not think she had much urine over past 24 hours, no UOP charted. Swelling diffusely is stable, severe, constant.   Objective: Vitals:   04/28/20 2046 04/29/20  0500 04/29/20 0518 04/29/20 1328  BP: (!) 146/54  (!) 163/61 (!) 129/47  Pulse: 64  68 64  Resp: 20  20 18   Temp: 98 F (36.7 C)  97.8 F (36.6 C) 97.6 F (36.4 C)  TempSrc: Oral  Oral Oral  SpO2: 100%  98% 100%  Weight:  114.4 kg    Height:       No intake or output data in the 24 hours ending 04/29/20 1550 Filed Weights   04/27/20 0612 04/28/20 0234 04/29/20 0500  Weight: 115.1 kg 115.1 kg 114.4 kg   Gen: 59 y.o. female in no distress Pulm: Nonlabored breathing. Clear. CV: Regular rate and rhythm. No murmur, rub, or gallop. No definite JVD, diffuse edema stable x4 extremities. GI: Abdomen soft, non-tender, protuberant, with normoactive bowel sounds.  Ext: Warm, no deformities Skin: No rashes, lesions or ulcers on visualized skin. Neuro: Alert and oriented. No focal neurological deficits. Psych: Judgement and insight appear fair. Mood euthymic & affect congruent. Behavior is appropriate.    Data Reviewed: I have personally reviewed following labs and imaging studies  CBC: Recent Labs  Lab 04/24/20 1349 04/24/20 1349 04/25/20 0527 04/26/20 1011 04/27/20 0833 04/28/20 0721 04/29/20 0830  WBC 15.5*   < > 10.7* 9.4 6.2 6.8 7.2  NEUTROABS 13.6*  --   --  7.2 4.7 5.3 5.6  HGB 9.5*   < > 8.9* 8.8* 8.0* 8.3* 8.0*  HCT 29.0*   < > 28.8* 27.6* 26.2* 26.6* 25.2*  MCV 92.9   < > 95.0 95.8 97.4 96.4 95.8  PLT 236   < > 210 209 148* 163 150   < > = values in this interval not displayed.   Basic Metabolic Panel: Recent Labs  Lab 04/25/20 0527 04/26/20 1011 04/27/20 0833 04/28/20 0721 04/29/20 0830  NA 139 137 137 136 135  K 3.0* 3.4* 3.2* 3.2* 3.7  CL 102 101 98 98 98  CO2 30 31 31 28 30   GLUCOSE 95 99 95 98 96  BUN 21* 19 21* 21* 22*  CREATININE 1.21* 1.07* 1.23* 1.15* 1.16*  CALCIUM 7.7* 7.4* 7.3* 7.6* 7.6*  MG 1.8 1.5*  --  1.7  --    GFR: Estimated Creatinine Clearance: 65.2 mL/min (A) (by C-G formula based on SCr of 1.16 mg/dL (H)). Liver Function  Tests: Recent Labs  Lab 04/25/20 0527 04/26/20 1011 04/27/20 0833 04/28/20 0721 04/29/20 0830  AST 24 19 16 18 17   ALT 11 10 10 11 11   ALKPHOS 74 63 56 55 56  BILITOT 0.5 0.3 0.2* 0.5 0.3  PROT 6.8 6.0* 5.3* 5.5* 5.3*  ALBUMIN 1.7* 1.5* 1.4* 1.5* 1.5*   No results for input(s): LIPASE, AMYLASE in the last 168 hours. Recent Labs  Lab 04/24/20 1349 04/27/20 0833  AMMONIA 38* 25   Coagulation Profile: Recent Labs  Lab 04/24/20 1349  INR 1.2   Cardiac Enzymes: No results for input(s): CKTOTAL, CKMB, CKMBINDEX, TROPONINI in the last 168 hours. BNP (last 3 results) No results for input(s): PROBNP in the last 8760 hours. HbA1C: No results for input(s): HGBA1C in the last 72 hours. CBG: Recent Labs  Lab 04/28/20 1212 04/28/20 1632 04/28/20 2048 04/29/20 0727 04/29/20 1106  GLUCAP  112* 88 105* 90 104*   Lipid Profile: No results for input(s): CHOL, HDL, LDLCALC, TRIG, CHOLHDL, LDLDIRECT in the last 72 hours. Thyroid Function Tests: No results for input(s): TSH, T4TOTAL, FREET4, T3FREE, THYROIDAB in the last 72 hours. Anemia Panel: Recent Labs    04/29/20 0830  FERRITIN 284   Urine analysis:    Component Value Date/Time   COLORURINE AMBER (A) 04/24/2020 1407   APPEARANCEUR CLOUDY (A) 04/24/2020 1407   LABSPEC 1.017 04/24/2020 1407   PHURINE 5.0 04/24/2020 1407   GLUCOSEU NEGATIVE 04/24/2020 1407   HGBUR MODERATE (A) 04/24/2020 1407   BILIRUBINUR NEGATIVE 04/24/2020 1407   KETONESUR NEGATIVE 04/24/2020 1407   PROTEINUR >=300 (A) 04/24/2020 1407   UROBILINOGEN 1.0 03/03/2014 0942   NITRITE NEGATIVE 04/24/2020 1407   LEUKOCYTESUR MODERATE (A) 04/24/2020 1407   Recent Results (from the past 240 hour(s))  Urine culture     Status: Abnormal   Collection Time: 04/24/20  2:07 PM   Specimen: Urine, Random  Result Value Ref Range Status   Specimen Description   Final    URINE, RANDOM Performed at [facility], [place] [address]  [place], [place] [address]    Special Requests   Final    NONE Performed at [facility], [place] [address] [place], [place] [address]    Culture >=100,000 COLONIES/mL ESCHERICHIA COLI (A)  Final   Report Status 04/26/2020 FINAL  Final   Organism ID, Bacteria ESCHERICHIA COLI (A)  Final      Susceptibility   Escherichia coli - MIC*    AMPICILLIN >=32 RESISTANT Resistant     CEFAZOLIN <=4 SENSITIVE Sensitive     CEFTRIAXONE <=0.25 SENSITIVE Sensitive     CIPROFLOXACIN >=4 RESISTANT Resistant     GENTAMICIN <=1 SENSITIVE Sensitive     IMIPENEM <=0.25 SENSITIVE Sensitive     NITROFURANTOIN <=16 SENSITIVE Sensitive     TRIMETH/SULFA >=320 RESISTANT Resistant     AMPICILLIN/SULBACTAM >=32 RESISTANT Resistant     PIP/TAZO 8 SENSITIVE Sensitive     * >=100,000 COLONIES/mL ESCHERICHIA COLI  SARS Coronavirus 2 by RT PCR (hospital order, performed in [place] hospital lab) Nasopharyngeal Nasopharyngeal Swab     Status: None   Collection Time: 04/24/20  3:33 PM   Specimen: Nasopharyngeal Swab  Result Value Ref Range Status   SARS Coronavirus 2 NEGATIVE NEGATIVE Final    Comment: (NOTE) SARS-CoV-2 target nucleic acids are NOT DETECTED.  The SARS-CoV-2 RNA is generally detectable in upper and lower respiratory specimens during the acute phase of infection. The lowest concentration of SARS-CoV-2 viral copies this assay can detect is 250 copies / mL. A negative result does not preclude SARS-CoV-2 infection and should not be used as the sole basis for treatment or other patient management decisions.  A negative result may occur with improper specimen collection / handling, submission of specimen other than nasopharyngeal swab, presence of viral mutation(s) within the areas targeted by this assay, and inadequate number of viral copies (<250 copies / mL). A negative result must be combined with clinical observations, patient history, and epidemiological information.  Fact Sheet  for Patients:   StrictlyIdeas.no  Fact Sheet for Healthcare Providers: BankingDealers.co.za  This test is not yet approved or  cleared by the [place] FDA and has been authorized for detection and/or diagnosis of SARS-CoV-2 by FDA under an Emergency Use Authorization (EUA).  This EUA will remain in effect (meaning this test can be used) for the duration of the  COVID-19 declaration under Section 564(b)(1) of the Act, 21 U.S.C. section 360bbb-3(b)(1), unless the authorization is terminated or revoked sooner.  Performed at [facility], [place] [address] [place], [place] [address]   Blood culture (routine x 2)     Status: Abnormal   Collection Time: 04/24/20  4:47 PM   Specimen: BLOOD  Result Value Ref Range Status   Specimen Description   Final    BLOOD RIGHT ANTECUBITAL Performed at [place] [address], [place], [place] [address]    Special Requests   Final    BOTTLES DRAWN AEROBIC AND ANAEROBIC Blood Culture adequate volume Performed at [place] [address] [place], [place] [address]    Culture  Setup Time   Final    GRAM POSITIVE COCCI IN CLUSTERS AEROBIC BOTTLE ONLY Organism ID to follow CRITICAL RESULT CALLED TO, READ BACK BY AND VERIFIED WITH: D. WOFFORD PHARMD, AT [id number] 04/25/20 BY D. VANHOOK    Culture (A)  Final    STAPHYLOCOCCUS CAPITIS THE SIGNIFICANCE OF ISOLATING THIS ORGANISM FROM A SINGLE SET OF BLOOD CULTURES WHEN MULTIPLE SETS ARE DRAWN IS UNCERTAIN. PLEASE NOTIFY THE MICROBIOLOGY DEPARTMENT WITHIN ONE WEEK IF SPECIATION AND SENSITIVITIES ARE REQUIRED. Performed at [facility], [place] [address] [place], [place] [address]    Report Status 04/27/2020 FINAL  Final  Blood Culture ID Panel (Reflexed)     Status: Abnormal   Collection Time: 04/24/20  4:47 PM  Result Value Ref Range Status   Enterococcus faecalis NOT DETECTED NOT DETECTED  Final   Enterococcus Faecium NOT DETECTED NOT DETECTED Final   Listeria monocytogenes NOT DETECTED NOT DETECTED Final   Staphylococcus species DETECTED (A) NOT DETECTED Final    Comment: CRITICAL RESULT CALLED TO, READ BACK BY AND VERIFIED WITH: D. WOFFORD PHARMD, AT 1758 04/25/20 BY D. VANHOOK    Staphylococcus aureus (BCID) NOT DETECTED NOT DETECTED Final   Staphylococcus epidermidis NOT DETECTED NOT DETECTED Final   Staphylococcus lugdunensis NOT DETECTED NOT DETECTED Final   Streptococcus species NOT DETECTED NOT DETECTED Final   Streptococcus agalactiae NOT DETECTED NOT DETECTED Final   Streptococcus pneumoniae NOT DETECTED NOT DETECTED Final   Streptococcus pyogenes NOT DETECTED NOT DETECTED Final   A.calcoaceticus-baumannii NOT DETECTED NOT DETECTED Final   Bacteroides fragilis NOT DETECTED NOT DETECTED Final   Enterobacterales NOT DETECTED NOT DETECTED Final   Enterobacter cloacae complex NOT DETECTED NOT DETECTED Final   Escherichia coli NOT DETECTED NOT DETECTED Final   Klebsiella aerogenes NOT DETECTED NOT DETECTED Final   Klebsiella oxytoca NOT DETECTED NOT DETECTED Final   Klebsiella pneumoniae NOT DETECTED NOT DETECTED Final   Proteus species NOT DETECTED NOT DETECTED Final   Salmonella species NOT DETECTED NOT DETECTED Final   Serratia marcescens NOT DETECTED NOT DETECTED Final   Haemophilus influenzae NOT DETECTED NOT DETECTED Final   Neisseria meningitidis NOT DETECTED NOT DETECTED Final   Pseudomonas aeruginosa NOT DETECTED NOT DETECTED Final   Stenotrophomonas maltophilia NOT DETECTED NOT DETECTED Final   Candida albicans NOT DETECTED NOT DETECTED Final   Candida auris NOT DETECTED NOT DETECTED Final   Candida glabrata NOT DETECTED NOT DETECTED Final   Candida krusei NOT DETECTED NOT DETECTED Final   Candida parapsilosis NOT DETECTED NOT DETECTED Final   Candida tropicalis NOT DETECTED NOT DETECTED Final   Cryptococcus neoformans/gattii NOT DETECTED NOT DETECTED  Final    Comment: Performed at [facility], [address],  [place] [address]  Blood culture (routine x 2)     Status: None   Collection Time: 04/24/20  5:04 PM   Specimen: BLOOD  Result Value Ref Range Status   Specimen Description   Final    BLOOD BLOOD LEFT FOREARM Performed at [place] [address] [place], [place] [address]    Special Requests   Final    BOTTLES DRAWN AEROBIC AND ANAEROBIC Blood Culture adequate volume Performed at [place] [address] [place], [place] [address]    Culture   Final    NO GROWTH 5 DAYS Performed at [facility], [place] [address], [place], [place] [address]    Report Status 04/29/2020 FINAL  Final      Radiology Studies: No results found.  Scheduled Meds: . atorvastatin  20 mg Oral Daily  . carvedilol  6.25 mg Oral BID WC  . donepezil  10 mg Oral Daily  . enoxaparin (LOVENOX) injection  60 mg Subcutaneous Q24H  . FLUoxetine  10 mg Oral Daily  . furosemide  60 mg Intravenous BID  . lacosamide  200 mg Oral BID  . losartan  50 mg Oral Daily  . metoCLOPramide  5 mg Oral TID AC  . potassium chloride  40 mEq Oral BID  . spironolactone  12.5 mg Oral Daily   Continuous Infusions:   LOS: 5 days   Time spent: 35 minutes.  Patrecia Pour, MD [facility] [email or web address] 04/29/2020, 3:50 PM

## 2020-04-29 NOTE — NC FL2 (Addendum)
[place] LEVEL OF CARE SCREENING TOOL     IDENTIFICATION  Patient Name: Marissa Wilkerson Birthdate: 05/22/1961 Sex: female Admission Date (Current Location): 04/24/2020  [facility] and [place] Number:  Herbalist and Address:  [facility],  [address] [place], [place]      Provider Number: [id number]  Attending Physician Name and Address:  Patrecia Pour, MD  Relative Name and Phone Number:  Janasha, [place] [phone]  [phone]    Current Level of Care: Hospital Recommended Level of Care: No Name Prior Approval Number:    Date Approved/Denied:   PASRR Number: [id number]  Discharge Plan: SNF    Current Diagnoses: Patient Active Problem List   Diagnosis Date Noted  . CHF exacerbation ([place]) 04/24/2020  . Weakness 01/05/2020  . History of seizure 01/05/2020  . Snoring 04/12/2018  . Chronic rhinitis 04/12/2018  . Nausea and vomiting 10/17/2017  . AKI (acute kidney injury) ([place]) 10/16/2017  . CHF (congestive heart failure), NYHA class I, unspecified failure chronicity, diastolic ([place]) 22/97/9892  . Hyperkalemia 09/15/2017  . CHF (congestive heart failure) ([place]) 09/15/2017  . Hyperlipidemia 08/08/2016  . Bilateral carotid artery disease ([place]) 08/08/2016  . Lower extremity weakness 02/22/2016  . Multiple lung nodules 03/05/2014  . Obstructive lung disease ([place]) 02/09/2014  . Chronic respiratory failure ([place]) 09/02/2013  . 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 Hypertension   . Diabetes mellitus without complication ([place])   . Diastolic dysfunction     Orientation RESPIRATION BLADDER Height & Weight     Self, Time, Situation, Place  Normal Incontinent Weight: 252 lb 3.3 oz (114.4 kg) Height:  5\' 3"  (160 cm)  BEHAVIORAL SYMPTOMS/MOOD NEUROLOGICAL BOWEL NUTRITION STATUS      Continent Diet (Heart Healthy Carb Modified diet)  AMBULATORY STATUS COMMUNICATION OF NEEDS Skin   Limited Assist Verbally  Normal                       Personal Care Assistance Level of Assistance  Bathing, Dressing, Feeding Bathing Assistance: Limited assistance Feeding assistance: Independent Dressing Assistance: Limited assistance     Functional Limitations Info  Sight, Speech, Hearing Sight Info: Adequate Hearing Info: Adequate Speech Info: Adequate    SPECIAL CARE FACTORS FREQUENCY  PT (By licensed PT), OT (By licensed OT)     PT Frequency: Minimum 5x a week OT Frequency: Minimum 5x a week            Contractures Contractures Info: Not present    Additional Factors Info  Code Status, Allergies, Psychotropic Code Status Info: Full Code Allergies Info: Cymbalta, Fentanyl Metformin And Related Mobic, Sulfa Antibiotics Voltaren Psychotropic Info: FLUoxetine (PROZAC) capsule 10 mg or         Current Medications (04/29/2020):  This is the current hospital active medication list Current Facility-Administered Medications  Medication Dose Route Frequency Provider Last Rate Last Admin  . acetaminophen (TYLENOL) tablet 650 mg  650 mg Oral Q6H PRN Donne Hazel, MD   650 mg at 04/25/20 1534   Or  . acetaminophen (TYLENOL) suppository 650 mg  650 mg Rectal Q6H PRN Donne Hazel, MD      . atorvastatin (LIPITOR) tablet 20 mg  20 mg Oral Daily Donne Hazel, MD   20 mg at 04/29/20 1059  . carvedilol (COREG) tablet 6.25 mg  6.25 mg Oral BID WC Donne Hazel, MD   6.25 mg at 04/29/20 0849  . donepezil (  ARICEPT) tablet 10 mg  10 mg Oral Daily Nita Sells, MD   10 mg at 04/29/20 1059  . enoxaparin (LOVENOX) injection 60 mg  60 mg Subcutaneous Q24H Donne Hazel, MD   60 mg at 04/28/20 2104  . FLUoxetine (PROZAC) capsule 10 mg  10 mg Oral Daily Donne Hazel, MD   10 mg at 04/29/20 1100  . furosemide (LASIX) injection 60 mg  60 mg Intravenous BID Nita Sells, MD   60 mg at 04/29/20 0852  . hydrALAZINE (APRESOLINE) injection 5 mg  5 mg Intravenous Q4H PRN Donne Hazel, MD   5 mg at 04/29/20 0347  . HYDROcodone-acetaminophen (NORCO) 10-325 MG per tablet 1 tablet  1 tablet Oral Q6H PRN Donne Hazel, MD   1 tablet at 04/29/20 1309  . lacosamide (VIMPAT) tablet 200 mg  200 mg Oral BID Donne Hazel, MD   200 mg at 04/29/20 1058  . losartan (COZAAR) tablet 50 mg  50 mg Oral Daily Donne Hazel, MD   50 mg at 04/29/20 1059  . metoCLOPramide (REGLAN) tablet 5 mg  5 mg Oral TID AC Nita Sells, MD   5 mg at 04/29/20 1229  . ondansetron (ZOFRAN) injection 4 mg  4 mg Intravenous Q6H PRN Donne Hazel, MD   4 mg at 04/25/20 1817  . ondansetron (ZOFRAN-ODT) disintegrating tablet 4 mg  4 mg Oral Q8H PRN Nita Sells, MD      . potassium chloride SA (KLOR-CON) CR tablet 40 mEq  40 mEq Oral BID Patrecia Pour, MD   40 mEq at 04/29/20 1058  . spironolactone (ALDACTONE) tablet 12.5 mg  12.5 mg Oral Daily Nita Sells, MD   12.5 mg at 04/29/20 1100     Discharge Medications: Please see discharge summary for a list of discharge medications.  Relevant Imaging Results:  Relevant Lab Results:   Additional Information SSN [id number] Patient had first Covid Vaccine from [facility] on 03-31-2020 she was due for her second one on 04-28-2020  Ross Ludwig, [place]

## 2020-04-29 NOTE — TOC Initial Note (Addendum)
Transition of Care [facility]) - Initial/Assessment Note    Patient Details  Name: Marissa Wilkerson MRN: [id number] Date of Birth: 01-22-1961  [facility] CM/SW Contact:    Marissa Ludwig, LCSW Phone Number: 04/29/2020, 3:55 PM  Clinical Narrative:                  Patient is a 59 year old female who is alert and oriented x4.  Patient is married, CSW spoke to patient's husband via phone due to patient sleeping.  CSW discussed PT recommendation for short term rehab, patient's husband is in agreement to having patient to go to SNF for rehab and gave CSW permission to begin bed search in [facility].  CSW explained how insurance will pay for stay and what to expect at SNF.  CSW faxed clinicals to [facility], [place] reference number is [id number].  CSW presented current bed offers to patient's husband.via text, waiting for decision.  Expected Discharge Plan: Skilled Nursing Facility Barriers to Discharge: Insurance Authorization, Continued Medical Work up   Patient Goals and CMS Choice Patient states their goals for this hospitalization and ongoing recovery are:: To go to SNF for short term rehab, then return back home.   Choice offered to / list presented to : Spouse  Expected Discharge Plan and Services Expected Discharge Plan: [place] Choice: [place] arrangements for the past 2 months: Single Family Home                   DME Agency: NA       HH Arranged: NA          Prior Living Arrangements/Services Living arrangements for the past 2 months: Single Family Home Lives with:: Spouse Patient language and need for interpreter reviewed:: Yes Do you feel safe going back to the place where you live?: No   Patient's husband feels she needs some short term rehab before she is able to return back home.  Need for Family Participation in Patient Care: Yes (Comment) Care giver support system in place?: No  (comment)   Criminal Activity/Legal Involvement Pertinent to Current Situation/Hospitalization: No - Comment as needed  Activities of Daily Living Home Assistive Devices/Equipment: CBG Meter, Bedside commode/3-in-1, Blood pressure cuff, Dentures (specify type), Oxygen, Wheelchair ADL Screening (condition at time of admission) Patient's cognitive ability adequate to safely complete daily activities?: Yes Is the patient deaf or have difficulty hearing?: No Does the patient have difficulty seeing, even when wearing glasses/contacts?: No Does the patient have difficulty concentrating, remembering, or making decisions?: Yes Patient able to express need for assistance with ADLs?: Yes Does the patient have difficulty dressing or bathing?: Yes Independently performs ADLs?: No Communication: Independent Dressing (OT): Dependent Is this a change from baseline?: Pre-admission baseline Grooming: Dependent Is this a change from baseline?: Pre-admission baseline Feeding: Independent Bathing: Dependent Is this a change from baseline?: Pre-admission baseline Toileting: Dependent Is this a change from baseline?: Pre-admission baseline Walks in Home: Dependent (wheelchair bound ) Is this a change from baseline?: Pre-admission baseline Does the patient have difficulty walking or climbing stairs?: Yes Weakness of Legs: Both Weakness of Arms/Hands: Both  Permission Sought/Granted Permission sought to share information with : Facility Sport and exercise psychologist, Family Supports Permission granted to share information with : Yes, Verbal Permission Granted, Yes, Release of Information Signed  Share Information with NAMERosaura, Wilkerson [phone]  [phone]  Permission granted to share info w AGENCY:  SNF admissions        Emotional Assessment Appearance:: Appears stated age Attitude/Demeanor/Rapport: Engaged Affect (typically observed): Accepting, Calm, Stable Orientation: : Oriented to Self,  Oriented to Place, Oriented to  Time, Oriented to Situation Alcohol / Substance Use: Not Applicable Psych Involvement: No (comment)  Admission diagnosis:  CHF exacerbation ([place]) [I50.9] Patient Active Problem List   Diagnosis Date Noted  . CHF exacerbation ([place]) 04/24/2020  . Weakness 01/05/2020  . History of seizure 01/05/2020  . Snoring 04/12/2018  . Chronic rhinitis 04/12/2018  . Nausea and vomiting 10/17/2017  . AKI (acute kidney injury) ([place]) 10/16/2017  . CHF (congestive heart failure), NYHA class I, unspecified failure chronicity, diastolic ([place]) 14/97/0263  . Hyperkalemia 09/15/2017  . CHF (congestive heart failure) ([place]) 09/15/2017  . Hyperlipidemia 08/08/2016  . Bilateral carotid artery disease ([place]) 08/08/2016  . Lower extremity weakness 02/22/2016  . Multiple lung nodules 03/05/2014  . Obstructive lung disease ([place]) 02/09/2014  . Chronic respiratory failure ([place]) 09/02/2013  . 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 Hypertension   . Diabetes mellitus without complication ([place])   . Diastolic dysfunction    PCP:  Jani Gravel, MD Pharmacy:   [place], [place] [address] [place] [address] Phone: [phone] Fax: [phone]     Social Determinants of Health (SDOH) Interventions    Readmission Risk Interventions No flowsheet data found.

## 2020-04-30 DIAGNOSIS — L8990 Pressure ulcer of unspecified site, unspecified stage: Secondary | ICD-10-CM | POA: Insufficient documentation

## 2020-04-30 LAB — GLUCOSE, CAPILLARY
Glucose-Capillary: 93 mg/dL (ref 70–99)
Glucose-Capillary: 115 mg/dL — ABNORMAL HIGH (ref 70–99)
Glucose-Capillary: 98 mg/dL (ref 70–99)
Glucose-Capillary: 118 mg/dL — ABNORMAL HIGH (ref 70–99)

## 2020-04-30 LAB — HEPATITIS B SURFACE ANTIBODY, QUANTITATIVE: Hep B S AB Quant (Post): <3.1 m[IU]/mL — ABNORMAL LOW (ref >9.9)

## 2020-04-30 LAB — CERULOPLASMIN: Ceruloplasmin: 17.7 mg/dL — ABNORMAL LOW (ref 19.0–39.0)

## 2020-04-30 NOTE — Progress Notes (Addendum)
PROGRESS NOTE  Marissa Wilkerson  XBD:[id number] DOB: 12-Jan-1961 DOA: 04/24/2020 PCP: Jani Gravel, MD  Outpatient Specialists: Cardiology, Dr. Gwenlyn Found Brief Narrative: Marissa Wilkerson is a 59 y.o. female with a history of pulmonary sarcoidosis, chronic hypoxic respiratory failure on 3.5LPM, chronic HFpEF, HTN, IDT2DM, LLE DVT, seizure disorder, reported dementia, and anemia of chronic disease who presented to [facility] 8/28 due to acute exacerbation of CHF. CXR revealed diffuse bilateral interstitial opacities consistent with pulmonary edema and left pleural effusion with cardiomegaly. Imaging also suggestive of new diagnosis hepatic cirrhosis.   Assessment & Plan: Principal Problem:   CHF exacerbation ([place]) Active Problems:   Hypertension   Diabetes mellitus without complication ([place])   Diastolic dysfunction   Sarcoidosis of lung (HCC)   Hyperlipidemia   CHF (congestive heart failure), NYHA class I, unspecified failure chronicity, diastolic (HCC)   History of seizure   Pressure injury of skin  Acute on chronic HFpEF with anasarca: Echo 8/29 with LVEF 60-65%, indeterminate diastolic parameters, no WMA's, normal RV, mild-moderate AS mean gradient 6mmHg (unchanged from report of echo Jan 2019). Exacerbated by hypoalbuminemia likely related to cirrhosis. BNP 264.5.  -Has had better and decent diuresis in last 24 hours.  Continue Lasix 80mg  IV BID and increased dose of Aldactone 100 mg daily. - Continue daily weights.    Chronic hypoxemic respiratory failure due to pulmonary sarcoidosis with acute pulmonary edema: Baseline oxygen usage is 4 L via nasal cannula, currently on 3.5 L.  Off MTX since 2018. - Follow up with pulmonology, Dr. Lamonte Sakai, as outpatient.  Hepatic cirrhosis: ?If related to sarcoid.  - INR 1.2, albumin 1.5, TBili 0.3.  - Check serologies, pending.  HCV Ab neg, HIV NR.  - Continue lasix, spironolactone as above - Na restriction - Low threshold for lactulose. Ammonia initially elevated  to 38, down to 25. - Consider paracentesis if develops abdominal discomfort.  Hypoalbuminemia: Impaired synthetic function and proteinuria contributing. - Quantify proteinuria. Noted to have proteinuria dating to 2019.   Hypokalemia: Resolved.  Seizure disorder:  - Continue vimpat, continue [facility] follow up as outpatient  IDT2DM: HbA1c 4.6%, possibly developing impairment in gluconeogenesis.  SSI discontinued.  HTN crisis: POA, resolved.  Blood pressure stable and improved. - Continue antihypertensive regimen and volume removal.   Dementia, psychosis?:  - Continue home medications aricept and prozac.  She is alert and oriented.  Staphylococcus capitis in 1 blood culture: Suspected contaminant.  - Monitor clinically without targeted Tx.  E. coli UTI:  - Completed Tx w/ceftriaxone  Obesity: Estimated body mass index is 44.68 kg/m as calculated from the following:   Height as of this encounter: 5\' 3"  (1.6 m).   Weight as of this encounter: 114.4 kg.   Breast mass: Exam benign per previous hospitalist.  - OP mammogram per routine.   DVT prophylaxis: Lovenox Code Status: Full Family Communication: Updated husband Fulton Reek over the phone. Disposition Plan:  Status is: Inpatient  Remains inpatient appropriate because:IV treatments appropriate due to intensity of illness or inability to take PO. Diffuse volume overload refractory to IV diuresis.   Dispo: The patient is from: Home              Anticipated d/c is to: SNF              Anticipated d/c date is: 2 days              Patient currently is not medically stable to d/c.  Consultants:   None  Procedures:   None  Antimicrobials:  Ceftriaxone 8/28 - 8/30   Subjective: Patient seen and examined.  She states that she feels better and less edematous compared to yesterday.  Denies any shortness of breath or any other complaint.  Objective: Vitals:   04/29/20 1328 04/29/20 2136 04/30/20 0618 04/30/20 0743  BP:  (!) 129/47 (!) 148/65 (!) 180/73 (!) 155/55  Pulse: 64 64 73   Resp: 18 20 20    Temp: 97.6 F (36.4 C) (!) 97.5 F (36.4 C) 97.7 F (36.5 C)   TempSrc: Oral Oral Oral   SpO2: 100% 100% 97%   Weight:      Height:        Intake/Output Summary (Last 24 hours) at 04/30/2020 1240 Last data filed at 04/30/2020 0558 Gross per 24 hour  Intake 240 ml  Output 1700 ml  Net -1460 ml   Filed Weights   04/27/20 0612 04/28/20 0234 04/29/20 0500  Weight: 115.1 kg 115.1 kg 114.4 kg   General exam: Appears calm and comfortable  Respiratory system: Clear to auscultation. Respiratory effort normal. Cardiovascular system: S1 & S2 heard, RRR. No JVD, murmurs, rubs, gallops or clicks.  +4 pitting edema bilateral lower extremity Gastrointestinal system: Abdomen is nondistended, soft and nontender. No organomegaly or masses felt. Normal bowel sounds heard. Central nervous system: Alert and oriented. No focal neurological deficits. Extremities: Symmetric 5 x 5 power. Skin: No rashes, lesions or ulcers.  Psychiatry: Judgement and insight appear normal. Mood & affect appropriate.    Data Reviewed: I have personally reviewed following labs and imaging studies  CBC: Recent Labs  Lab 04/24/20 1349 04/24/20 1349 04/25/20 0527 04/26/20 1011 04/27/20 0833 04/28/20 0721 04/29/20 0830  WBC 15.5*   < > 10.7* 9.4 6.2 6.8 7.2  NEUTROABS 13.6*  --   --  7.2 4.7 5.3 5.6  HGB 9.5*   < > 8.9* 8.8* 8.0* 8.3* 8.0*  HCT 29.0*   < > 28.8* 27.6* 26.2* 26.6* 25.2*  MCV 92.9   < > 95.0 95.8 97.4 96.4 95.8  PLT 236   < > 210 209 148* 163 150   < > = values in this interval not displayed.   Basic Metabolic Panel: Recent Labs  Lab 04/25/20 0527 04/26/20 1011 04/27/20 0833 04/28/20 0721 04/29/20 0830  NA 139 137 137 136 135  K 3.0* 3.4* 3.2* 3.2* 3.7  CL 102 101 98 98 98  CO2 30 31 31 28 30   GLUCOSE 95 99 95 98 96  BUN 21* 19 21* 21* 22*  CREATININE 1.21* 1.07* 1.23* 1.15* 1.16*  CALCIUM 7.7* 7.4* 7.3*  7.6* 7.6*  MG 1.8 1.5*  --  1.7  --    GFR: Estimated Creatinine Clearance: 65.2 mL/min (A) (by C-G formula based on SCr of 1.16 mg/dL (H)). Liver Function Tests: Recent Labs  Lab 04/25/20 0527 04/26/20 1011 04/27/20 0833 04/28/20 0721 04/29/20 0830  AST 24 19 16 18 17   ALT 11 10 10 11 11   ALKPHOS 74 63 56 55 56  BILITOT 0.5 0.3 0.2* 0.5 0.3  PROT 6.8 6.0* 5.3* 5.5* 5.3*  ALBUMIN 1.7* 1.5* 1.4* 1.5* 1.5*   No results for input(s): LIPASE, AMYLASE in the last 168 hours. Recent Labs  Lab 04/24/20 1349 04/27/20 0833  AMMONIA 38* 25   Coagulation Profile: Recent Labs  Lab 04/24/20 1349  INR 1.2   Cardiac Enzymes: No results for input(s): CKTOTAL, CKMB, CKMBINDEX, TROPONINI in the last 168 hours. BNP (last 3 results)  No results for input(s): PROBNP in the last 8760 hours. HbA1C: No results for input(s): HGBA1C in the last 72 hours. CBG: Recent Labs  Lab 04/29/20 0727 04/29/20 1106 04/29/20 1620 04/29/20 2137 04/30/20 0732  GLUCAP 90 104* 106* 103* 93   Lipid Profile: No results for input(s): CHOL, HDL, LDLCALC, TRIG, CHOLHDL, LDLDIRECT in the last 72 hours. Thyroid Function Tests: No results for input(s): TSH, T4TOTAL, FREET4, T3FREE, THYROIDAB in the last 72 hours. Anemia Panel: Recent Labs    04/29/20 0830  FERRITIN 284   Urine analysis:    Component Value Date/Time   COLORURINE AMBER (A) 04/24/2020 1407   APPEARANCEUR CLOUDY (A) 04/24/2020 1407   LABSPEC 1.017 04/24/2020 1407   PHURINE 5.0 04/24/2020 1407   GLUCOSEU NEGATIVE 04/24/2020 1407   HGBUR MODERATE (A) 04/24/2020 1407   BILIRUBINUR NEGATIVE 04/24/2020 1407   KETONESUR NEGATIVE 04/24/2020 1407   PROTEINUR >=300 (A) 04/24/2020 1407   UROBILINOGEN 1.0 03/03/2014 0942   NITRITE NEGATIVE 04/24/2020 1407   LEUKOCYTESUR MODERATE (A) 04/24/2020 1407   Recent Results (from the past 240 hour(s))  Urine culture     Status: Abnormal   Collection Time: 04/24/20  2:07 PM   Specimen: Urine, Random   Result Value Ref Range Status   Specimen Description   Final    URINE, RANDOM Performed at [facility], [place] [address], [place],  [address]    Special Requests   Final    NONE Performed at [facility], [place] [address], [place], [place] [address]    Culture >=100,000 COLONIES/mL ESCHERICHIA COLI (A)  Final   Report Status 04/26/2020 FINAL  Final   Organism ID, Bacteria ESCHERICHIA COLI (A)  Final      Susceptibility   Escherichia coli - MIC*    AMPICILLIN >=32 RESISTANT Resistant     CEFAZOLIN <=4 SENSITIVE Sensitive     CEFTRIAXONE <=0.25 SENSITIVE Sensitive     CIPROFLOXACIN >=4 RESISTANT Resistant     GENTAMICIN <=1 SENSITIVE Sensitive     IMIPENEM <=0.25 SENSITIVE Sensitive     NITROFURANTOIN <=16 SENSITIVE Sensitive     TRIMETH/SULFA >=320 RESISTANT Resistant     AMPICILLIN/SULBACTAM >=32 RESISTANT Resistant     PIP/TAZO 8 SENSITIVE Sensitive     * >=100,000 COLONIES/mL ESCHERICHIA COLI  SARS Coronavirus 2 by RT PCR (hospital order, performed in [facility] hospital lab) Nasopharyngeal Nasopharyngeal Swab     Status: None   Collection Time: 04/24/20  3:33 PM   Specimen: Nasopharyngeal Swab  Result Value Ref Range Status   SARS Coronavirus 2 NEGATIVE NEGATIVE Final    Comment: (NOTE) SARS-CoV-2 target nucleic acids are NOT DETECTED.  The SARS-CoV-2 RNA is generally detectable in upper and lower respiratory specimens during the acute phase of infection. The lowest concentration of SARS-CoV-2 viral copies this assay can detect is 250 copies / mL. A negative result does not preclude SARS-CoV-2 infection and should not be used as the sole basis for treatment or other patient management decisions.  A negative result may occur with improper specimen collection / handling, submission of specimen other than nasopharyngeal swab, presence of viral mutation(s) within the areas targeted by this assay, and inadequate number of viral  copies (<250 copies / mL). A negative result must be combined with clinical observations, patient history, and epidemiological information.  Fact Sheet for Patients:   StrictlyIdeas.no  Fact Sheet for Healthcare Providers: BankingDealers.co.za  This test is not yet approved or  cleared by the [place]  States FDA and has been authorized for detection and/or diagnosis of SARS-CoV-2 by FDA under an Emergency Use Authorization (EUA).  This EUA will remain in effect (meaning this test can be used) for the duration of the COVID-19 declaration under Section 564(b)(1) of the Act, 21 U.S.C. section 360bbb-3(b)(1), unless the authorization is terminated or revoked sooner.  Performed at [facility], [place] [address], [place], [place] [address]   Blood culture (routine x 2)     Status: Abnormal   Collection Time: 04/24/20  4:47 PM   Specimen: BLOOD  Result Value Ref Range Status   Specimen Description   Final    BLOOD RIGHT ANTECUBITAL Performed at [place] [address] [place], [place] [address]    Special Requests   Final    BOTTLES DRAWN AEROBIC AND ANAEROBIC Blood Culture adequate volume Performed at [facility] [address] [place], [place] [address]    Culture  Setup Time   Final    GRAM POSITIVE COCCI IN CLUSTERS AEROBIC BOTTLE ONLY Organism ID to follow CRITICAL RESULT CALLED TO, READ BACK BY AND VERIFIED WITH: D. WOFFORD PHARMD, AT 4315 04/25/20 BY D. VANHOOK    Culture (A)  Final    STAPHYLOCOCCUS CAPITIS THE SIGNIFICANCE OF ISOLATING THIS ORGANISM FROM A SINGLE SET OF BLOOD CULTURES WHEN MULTIPLE SETS ARE DRAWN IS UNCERTAIN. PLEASE NOTIFY THE MICROBIOLOGY DEPARTMENT WITHIN ONE WEEK IF SPECIATION AND SENSITIVITIES ARE REQUIRED. Performed at [facility], [place] [address], [place], [place] [address]    Report Status 04/27/2020 FINAL  Final  Blood Culture ID  Panel (Reflexed)     Status: Abnormal   Collection Time: 04/24/20  4:47 PM  Result Value Ref Range Status   Enterococcus faecalis NOT DETECTED NOT DETECTED Final   Enterococcus Faecium NOT DETECTED NOT DETECTED Final   Listeria monocytogenes NOT DETECTED NOT DETECTED Final   Staphylococcus species DETECTED (A) NOT DETECTED Final    Comment: CRITICAL RESULT CALLED TO, READ BACK BY AND VERIFIED WITH: D. WOFFORD PHARMD, AT 1758 04/25/20 BY D. VANHOOK    Staphylococcus aureus (BCID) NOT DETECTED NOT DETECTED Final   Staphylococcus epidermidis NOT DETECTED NOT DETECTED Final   Staphylococcus lugdunensis NOT DETECTED NOT DETECTED Final   Streptococcus species NOT DETECTED NOT DETECTED Final   Streptococcus agalactiae NOT DETECTED NOT DETECTED Final   Streptococcus pneumoniae NOT DETECTED NOT DETECTED Final   Streptococcus pyogenes NOT DETECTED NOT DETECTED Final   A.calcoaceticus-baumannii NOT DETECTED NOT DETECTED Final   Bacteroides fragilis NOT DETECTED NOT DETECTED Final   Enterobacterales NOT DETECTED NOT DETECTED Final   Enterobacter cloacae complex NOT DETECTED NOT DETECTED Final   Escherichia coli NOT DETECTED NOT DETECTED Final   Klebsiella aerogenes NOT DETECTED NOT DETECTED Final   Klebsiella oxytoca NOT DETECTED NOT DETECTED Final   Klebsiella pneumoniae NOT DETECTED NOT DETECTED Final   Proteus species NOT DETECTED NOT DETECTED Final   Salmonella species NOT DETECTED NOT DETECTED Final   Serratia marcescens NOT DETECTED NOT DETECTED Final   Haemophilus influenzae NOT DETECTED NOT DETECTED Final   Neisseria meningitidis NOT DETECTED NOT DETECTED Final   Pseudomonas aeruginosa NOT DETECTED NOT DETECTED Final   Stenotrophomonas maltophilia NOT DETECTED NOT DETECTED Final   Candida albicans NOT DETECTED NOT DETECTED Final   Candida auris NOT DETECTED NOT DETECTED Final   Candida glabrata NOT DETECTED NOT DETECTED Final   Candida krusei NOT DETECTED NOT DETECTED Final   Candida  parapsilosis  NOT DETECTED NOT DETECTED Final   Candida tropicalis NOT DETECTED NOT DETECTED Final   Cryptococcus neoformans/gattii NOT DETECTED NOT DETECTED Final    Comment: Performed at [facility], [place] [address], [place], [place] [address]  Blood culture (routine x 2)     Status: None   Collection Time: 04/24/20  5:04 PM   Specimen: BLOOD  Result Value Ref Range Status   Specimen Description   Final    BLOOD BLOOD LEFT FOREARM Performed at [facility] [address], [place], [place] [address]    Special Requests   Final    BOTTLES DRAWN AEROBIC AND ANAEROBIC Blood Culture adequate volume Performed at [facility] [address], [place], [place] [address]    Culture   Final    NO GROWTH 5 DAYS Performed at [facility], [place] [address] [place], [place] [address]    Report Status 04/29/2020 FINAL  Final      Radiology Studies: No results found.  Scheduled Meds: . atorvastatin  20 mg Oral Daily  . carvedilol  6.25 mg Oral BID WC  . donepezil  10 mg Oral Daily  . enoxaparin (LOVENOX) injection  60 mg Subcutaneous Q24H  . FLUoxetine  10 mg Oral Daily  . furosemide  80 mg Intravenous BID  . lacosamide  200 mg Oral BID  . losartan  50 mg Oral Daily  . metoCLOPramide  5 mg Oral TID AC  . potassium chloride  40 mEq Oral BID  . spironolactone  100 mg Oral Daily   Continuous Infusions:   LOS: 6 days   Time spent: 35 minutes.  Darliss Cheney, MD [facility] [email or web address] 04/30/2020, 12:40 PM

## 2020-04-30 NOTE — Care Management Important Message (Signed)
Important Message  Patient Details IM Letter given to the Patient Name: Marissa Wilkerson MRN: [id number] Date of Birth: [birth year]   Medicare Important Message Given:  Yes     Kerin Salen 04/30/2020, 12:21 PM

## 2020-04-30 NOTE — TOC Progression Note (Signed)
Transition of Care [facility]) - Progression Note    Patient Details  Name: Marissa Wilkerson MRN: [id number] Date of Birth: 06/15/1961  Transition of Care [facility]) CM/SW Contact  Ross Ludwig, [place] Phone Number:  04/30/2020, 3:19 PM  Clinical Narrative:     CSW spoke to patient's husband and they chose [facility].  CSW contacted [facility], they can accept patient on Monday pending new Covid test.  CSW spoke to [employer], patient has been approved for five days next review the 7th of September.  Reference number is [id number].     Expected Discharge Plan: Skilled Nursing Facility Barriers to Discharge: Ship broker, Continued Medical Work up  Expected Discharge Plan and Services Expected Discharge Plan: [place] Choice: [place] arrangements for the past 2 months: Single Family Home                   DME Agency: NA       HH Arranged: NA           Social Determinants of Health (SDOH) Interventions    Readmission Risk Interventions Readmission Risk Prevention Plan 04/29/2020  Transportation Screening Complete  PCP or Specialist Appt within 3-5 Days Complete  HRI or [place] Complete  Social Work Consult for [place] Planning/Counseling Complete  Palliative Care Screening Complete  Medication Review [employer]) Referral to Pharmacy  Some recent data might be hidden

## 2020-05-01 LAB — COMPREHENSIVE METABOLIC PANEL
ALT: 9 U/L (ref 0–44)
AST: 14 U/L — ABNORMAL LOW (ref 15–41)
Albumin: 1.7 g/dL — ABNORMAL LOW (ref 3.5–5.0)
Alkaline Phosphatase: 53 U/L (ref 38–126)
Anion gap: 6 (ref 5–15)
BUN: 28 mg/dL — ABNORMAL HIGH (ref 6–20)
CO2: 32 mmol/L (ref 22–32)
Calcium: 7.8 mg/dL — ABNORMAL LOW (ref 8.9–10.3)
Chloride: 97 mmol/L — ABNORMAL LOW (ref 98–111)
Creatinine, Ser: 1.3 mg/dL — ABNORMAL HIGH (ref 0.44–1.00)
GFR calc Af Amer: 53 mL/min — ABNORMAL LOW (ref >60)
GFR calc non Af Amer: 46 mL/min — ABNORMAL LOW (ref >60)
Glucose, Bld: 100 mg/dL — ABNORMAL HIGH (ref 70–99)
Potassium: 4.7 mmol/L (ref 3.5–5.1)
Sodium: 135 mmol/L (ref 135–145)
Total Bilirubin: 0.3 mg/dL (ref 0.3–1.2)
Total Protein: 5.5 g/dL — ABNORMAL LOW (ref 6.5–8.1)

## 2020-05-01 LAB — CBC WITH DIFFERENTIAL/PLATELET
Abs Immature Granulocytes: 0.05 10*3/uL (ref 0.00–0.07)
Basophils Absolute: 0 10*3/uL (ref 0.0–0.1)
Basophils Relative: 0 %
Eosinophils Absolute: 0.1 10*3/uL (ref 0.0–0.5)
Eosinophils Relative: 1 %
HCT: 24.7 % — ABNORMAL LOW (ref 36.0–46.0)
Hemoglobin: 7.7 g/dL — ABNORMAL LOW (ref 12.0–15.0)
Immature Granulocytes: 1 %
Lymphocytes Relative: 10 %
Lymphs Abs: 0.8 10*3/uL (ref 0.7–4.0)
MCH: 30.1 pg (ref 26.0–34.0)
MCHC: 31.2 g/dL (ref 30.0–36.0)
MCV: 96.5 fL (ref 80.0–100.0)
Monocytes Absolute: 1 10*3/uL (ref 0.1–1.0)
Monocytes Relative: 12 %
Neutro Abs: 6.1 10*3/uL (ref 1.7–7.7)
Neutrophils Relative %: 76 %
Platelets: 160 10*3/uL (ref 150–400)
RBC: 2.56 MIL/uL — ABNORMAL LOW (ref 3.87–5.11)
RDW: 13.5 % (ref 11.5–15.5)
WBC: 8.1 10*3/uL (ref 4.0–10.5)
nRBC: 0 % (ref 0.0–0.2)

## 2020-05-01 LAB — GLUCOSE, CAPILLARY
Glucose-Capillary: 84 mg/dL (ref 70–99)
Glucose-Capillary: 99 mg/dL (ref 70–99)
Glucose-Capillary: 96 mg/dL (ref 70–99)
Glucose-Capillary: 126 mg/dL — ABNORMAL HIGH (ref 70–99)

## 2020-05-01 LAB — MITOCHONDRIAL ANTIBODIES: Mitochondrial M2 Ab, IgG: <20 Units (ref 0.0–20.0)

## 2020-05-01 LAB — MAGNESIUM: Magnesium: 1.6 mg/dL — ABNORMAL LOW (ref 1.7–2.4)

## 2020-05-01 LAB — ANTI-SMOOTH MUSCLE ANTIBODY, IGG: F-Actin IgG: 9 Units (ref 0–19)

## 2020-05-01 MED ORDER — FUROSEMIDE 10 MG/ML IJ SOLN
80.0000 mg | Freq: Three times a day (TID) | INTRAMUSCULAR | Status: DC
  Administered 2020-05-01 – 2020-05-06 (×14): 80 mg via INTRAVENOUS
  Filled 2020-05-01 (×16): qty 8

## 2020-05-01 MED ORDER — MAGNESIUM SULFATE 2 GM/50ML IV SOLN
2.0000 g | Freq: Once | INTRAVENOUS | Status: AC
  Administered 2020-05-01: 2 g via INTRAVENOUS
  Filled 2020-05-01: qty 50

## 2020-05-01 NOTE — Progress Notes (Signed)
PROGRESS NOTE  Marissa Wilkerson  PXT:[id number] DOB: 20-Sep-1960 DOA: 04/24/2020 PCP: Jani Gravel, MD  Outpatient Specialists: Cardiology, Dr. Gwenlyn Found Brief Narrative: Marissa Wilkerson is a 59 y.o. female with a history of pulmonary sarcoidosis, chronic hypoxic respiratory failure on 3.5LPM, chronic HFpEF, HTN, IDT2DM, LLE DVT, seizure disorder, reported dementia, and anemia of chronic disease who presented to [facility] 8/28 due to acute exacerbation of CHF. CXR revealed diffuse bilateral interstitial opacities consistent with pulmonary edema and left pleural effusion with cardiomegaly. Imaging also suggestive of new diagnosis hepatic cirrhosis.  She was admitted to hospitalist service.  Echo 8/29 with LVEF 60-65%, indeterminate diastolic parameters, no WMA's, normal RV, mild-moderate AS mean gradient 57mmHg (unchanged from report of echo Jan 2019).  She was initially started on medium dose of IV Lasix.  Her renal function remained stable.  There was not enough diuresis so dose was increased to 80 mg IV twice daily and Aldactone was added.  She was also diagnosed with UTI for which she completed treatment with ceftriaxone.  Assessment & Plan: Principal Problem:   CHF exacerbation ([place]) Active Problems:   Hypertension   Diabetes mellitus without complication ([place])   Diastolic dysfunction   Sarcoidosis of lung ([facility])   Hyperlipidemia   CHF (congestive heart failure), NYHA class I, unspecified failure chronicity, diastolic ([facility])   History of seizure   Pressure injury of skin  Acute on chronic HFpEF with anasarca: Echo 8/29 with LVEF 60-65%, indeterminate diastolic parameters, no WMA's, normal RV, mild-moderate AS mean gradient 87mmHg (unchanged from report of echo Jan 2019). Exacerbated by hypoalbuminemia likely related to cirrhosis. BNP 264.5.  Has had only about 1 L of diuresis in the last 24 hours per chart, unless there is discrepancy in charting.  She also feels more swollen.  Will increase Lasix to 80 mg 3  times daily.  Continue same dose of Aldactone.  Impaired weight was 114 EKG on 04/29/2020 and 113 EKG today.  Chronic hypoxemic respiratory failure due to pulmonary sarcoidosis with acute pulmonary edema: Baseline oxygen usage is 4 L via nasal cannula, currently on 3.5 L.  Off MTX since 2018. - Follow up with pulmonology, Dr. Lamonte Sakai, as outpatient.  Hepatic cirrhosis: ?If related to sarcoid.  - INR 1.2, albumin 1.5, TBili 0.3.  - Check serologies, pending.  HCV Ab neg, HIV NR.  - Continue lasix, spironolactone as above - Na restriction - Low threshold for lactulose. Ammonia initially elevated to 38, down to 25. - Consider paracentesis if develops abdominal discomfort.  Hypoalbuminemia: Impaired synthetic function and proteinuria contributing. - Quantify proteinuria. Noted to have proteinuria dating to 2019.   Hypokalemia: Resolved.  Hypomagnesemia: 1.6.  Will replace today.  Kd stage IIIa: At baseline.  Monitor.  Seizure disorder:  - Continue vimpat, continue [facility] follow up as outpatient  IDT2DM: HbA1c 4.6%, possibly developing impairment in gluconeogenesis.  SSI discontinued.  HTN crisis: POA, resolved.  Blood pressure stable and improved. - Continue antihypertensive regimen and volume removal.   Dementia, psychosis?:  - Continue home medications aricept and prozac.  She is alert and oriented.  Staphylococcus capitis in 1 blood culture: Suspected contaminant.  - Monitor clinically without targeted Tx.  E. coli UTI:  - Completed Tx w/ceftriaxone  Obesity: Estimated body mass index is 44.17 kg/m as calculated from the following:   Height as of this encounter: 5\' 3"  (1.6 m).   Weight as of this encounter: 113.1 kg.   Breast mass: Exam benign per previous hospitalist.  -  OP mammogram per routine.   DVT prophylaxis: Lovenox Code Status: Full Family Communication: Updated husband Fulton Reek over the phone yesterday.  Plan of care discussed with patient herself  today. Disposition Plan:  Status is: Inpatient  Remains inpatient appropriate because:IV treatments appropriate due to intensity of illness or inability to take PO. Diffuse volume overload refractory to IV diuresis.   Dispo: The patient is from: Home              Anticipated d/c is to: SNF              Anticipated d/c date is: 05/03/2020              Patient currently is not medically stable to d/c.  Consultants:   None  Procedures:   None  Antimicrobials:  Ceftriaxone 8/28 - 8/30   Subjective: Seen and examined.  She states that " I feel more swollen today" but denied any other complaint.  Denied any shortness of breath stating " I never had any shortness of breath".   Objective: Vitals:   04/30/20 1310 04/30/20 2147 05/01/20 0435 05/01/20 0500  BP: (!) 138/57 (!) 138/49  (!) 167/67  Pulse: 64 64  66  Resp: 14   20  Temp: 97.7 F (36.5 C) 97.9 F (36.6 C)  97.7 F (36.5 C)  TempSrc: Oral Oral  Oral  SpO2: 99% 100%  98%  Weight:   113.1 kg   Height:        Intake/Output Summary (Last 24 hours) at 05/01/2020 1134 Last data filed at 05/01/2020 1106 Gross per 24 hour  Intake --  Output 1550 ml  Net -1550 ml   Filed Weights   04/28/20 0234 04/29/20 0500 05/01/20 0435  Weight: 115.1 kg 114.4 kg 113.1 kg   General exam: Appears calm and comfortable  Respiratory system: Clear to auscultation. Respiratory effort normal. Cardiovascular system: S1 & S2 heard, RRR. No JVD, murmurs, rubs, gallops or clicks.  +4 pitting edema bilateral lower extremity. Gastrointestinal system: Abdomen is nondistended, soft and nontender. No organomegaly or masses felt. Normal bowel sounds heard. Central nervous system: Alert and oriented. No focal neurological deficits. Extremities: Symmetric 5 x 5 power. Skin: No rashes, lesions or ulcers.  Psychiatry: Judgement and insight appear normal. Mood & affect appropriate.   Data Reviewed: I have personally reviewed following labs and imaging  studies  CBC: Recent Labs  Lab 04/26/20 1011 04/27/20 0833 04/28/20 0721 04/29/20 0830 05/01/20 0519  WBC 9.4 6.2 6.8 7.2 8.1  NEUTROABS 7.2 4.7 5.3 5.6 6.1  HGB 8.8* 8.0* 8.3* 8.0* 7.7*  HCT 27.6* 26.2* 26.6* 25.2* 24.7*  MCV 95.8 97.4 96.4 95.8 96.5  PLT 209 148* 163 150 734   Basic Metabolic Panel: Recent Labs  Lab 04/25/20 0527 04/25/20 0527 04/26/20 1011 04/27/20 0833 04/28/20 0721 04/29/20 0830 05/01/20 0519  NA 139   < > 137 137 136 135 135  K 3.0*   < > 3.4* 3.2* 3.2* 3.7 4.7  CL 102   < > 101 98 98 98 97*  CO2 30   < > 31 31 28 30  32  GLUCOSE 95   < > 99 95 98 96 100*  BUN 21*   < > 19 21* 21* 22* 28*  CREATININE 1.21*   < > 1.07* 1.23* 1.15* 1.16* 1.30*  CALCIUM 7.7*   < > 7.4* 7.3* 7.6* 7.6* 7.8*  MG 1.8  --  1.5*  --  1.7  --  1.6*   < > =  values in this interval not displayed.   GFR: Estimated Creatinine Clearance: 57.8 mL/min (A) (by C-G formula based on SCr of 1.3 mg/dL (H)). Liver Function Tests: Recent Labs  Lab 04/26/20 1011 04/27/20 0833 04/28/20 0721 04/29/20 0830 05/01/20 0519  AST 19 16 18 17  14*  ALT 10 10 11 11 9   ALKPHOS 63 56 55 56 53  BILITOT 0.3 0.2* 0.5 0.3 0.3  PROT 6.0* 5.3* 5.5* 5.3* 5.5*  ALBUMIN 1.5* 1.4* 1.5* 1.5* 1.7*   No results for input(s): LIPASE, AMYLASE in the last 168 hours. Recent Labs  Lab 04/24/20 1349 04/27/20 0833  AMMONIA 38* 25   Coagulation Profile: Recent Labs  Lab 04/24/20 1349  INR 1.2   Cardiac Enzymes: No results for input(s): CKTOTAL, CKMB, CKMBINDEX, TROPONINI in the last 168 hours. BNP (last 3 results) No results for input(s): PROBNP in the last 8760 hours. HbA1C: No results for input(s): HGBA1C in the last 72 hours. CBG: Recent Labs  Lab 04/30/20 0732 04/30/20 1246 04/30/20 1744 04/30/20 2143 05/01/20 0804  GLUCAP 93 115* 98 118* 84   Lipid Profile: No results for input(s): CHOL, HDL, LDLCALC, TRIG, CHOLHDL, LDLDIRECT in the last 72 hours. Thyroid Function Tests: No  results for input(s): TSH, T4TOTAL, FREET4, T3FREE, THYROIDAB in the last 72 hours. Anemia Panel: Recent Labs    04/29/20 0830  FERRITIN 284   Urine analysis:    Component Value Date/Time   COLORURINE AMBER (A) 04/24/2020 1407   APPEARANCEUR CLOUDY (A) 04/24/2020 1407   LABSPEC 1.017 04/24/2020 1407   PHURINE 5.0 04/24/2020 1407   GLUCOSEU NEGATIVE 04/24/2020 1407   HGBUR MODERATE (A) 04/24/2020 1407   BILIRUBINUR NEGATIVE 04/24/2020 1407   KETONESUR NEGATIVE 04/24/2020 1407   PROTEINUR >=300 (A) 04/24/2020 1407   UROBILINOGEN 1.0 03/03/2014 0942   NITRITE NEGATIVE 04/24/2020 1407   LEUKOCYTESUR MODERATE (A) 04/24/2020 1407   Recent Results (from the past 240 hour(s))  Urine culture     Status: Abnormal   Collection Time: 04/24/20  2:07 PM   Specimen: Urine, Random  Result Value Ref Range Status   Specimen Description   Final    URINE, RANDOM Performed at [facility], [place] [address] [place], [place] [address]    Special Requests   Final    NONE Performed at [facility], [place] [address] [place], [place] [address]    Culture >=100,000 COLONIES/mL ESCHERICHIA COLI (A)  Final   Report Status 04/26/2020 FINAL  Final   Organism ID, Bacteria ESCHERICHIA COLI (A)  Final      Susceptibility   Escherichia coli - MIC*    AMPICILLIN >=32 RESISTANT Resistant     CEFAZOLIN <=4 SENSITIVE Sensitive     CEFTRIAXONE <=0.25 SENSITIVE Sensitive     CIPROFLOXACIN >=4 RESISTANT Resistant     GENTAMICIN <=1 SENSITIVE Sensitive     IMIPENEM <=0.25 SENSITIVE Sensitive     NITROFURANTOIN <=16 SENSITIVE Sensitive     TRIMETH/SULFA >=320 RESISTANT Resistant     AMPICILLIN/SULBACTAM >=32 RESISTANT Resistant     PIP/TAZO 8 SENSITIVE Sensitive     * >=100,000 COLONIES/mL ESCHERICHIA COLI  SARS Coronavirus 2 by RT PCR (hospital order, performed in [facility] hospital lab) Nasopharyngeal Nasopharyngeal Swab     Status: None   Collection Time: 04/24/20   3:33 PM   Specimen: Nasopharyngeal Swab  Result Value Ref Range Status   SARS Coronavirus 2 NEGATIVE NEGATIVE Final    Comment: (NOTE) SARS-CoV-2 target nucleic acids are  NOT DETECTED.  The SARS-CoV-2 RNA is generally detectable in upper and lower respiratory specimens during the acute phase of infection. The lowest concentration of SARS-CoV-2 viral copies this assay can detect is 250 copies / mL. A negative result does not preclude SARS-CoV-2 infection and should not be used as the sole basis for treatment or other patient management decisions.  A negative result may occur with improper specimen collection / handling, submission of specimen other than nasopharyngeal swab, presence of viral mutation(s) within the areas targeted by this assay, and inadequate number of viral copies (<250 copies / mL). A negative result must be combined with clinical observations, patient history, and epidemiological information.  Fact Sheet for Patients:   StrictlyIdeas.no  Fact Sheet for Healthcare Providers: BankingDealers.co.za  This test is not yet approved or  cleared by the [place] FDA and has been authorized for detection and/or diagnosis of SARS-CoV-2 by FDA under an Emergency Use Authorization (EUA).  This EUA will remain in effect (meaning this test can be used) for the duration of the COVID-19 declaration under Section 564(b)(1) of the Act, 21 U.S.C. section 360bbb-3(b)(1), unless the authorization is terminated or revoked sooner.  Performed at [facility], [place] [address], [place], [place] [address]   Blood culture (routine x 2)     Status: Abnormal   Collection Time: 04/24/20  4:47 PM   Specimen: BLOOD  Result Value Ref Range Status   Specimen Description   Final    BLOOD RIGHT ANTECUBITAL Performed at [facility] [address], [place], [place] [address]    Special Requests   Final     BOTTLES DRAWN AEROBIC AND ANAEROBIC Blood Culture adequate volume Performed at [place] [address] [place], [place] [address]    Culture  Setup Time   Final    GRAM POSITIVE COCCI IN CLUSTERS AEROBIC BOTTLE ONLY Organism ID to follow CRITICAL RESULT CALLED TO, READ BACK BY AND VERIFIED WITH: D. WOFFORD PHARMD, AT 0300 04/25/20 BY D. VANHOOK    Culture (A)  Final    STAPHYLOCOCCUS CAPITIS THE SIGNIFICANCE OF ISOLATING THIS ORGANISM FROM A SINGLE SET OF BLOOD CULTURES WHEN MULTIPLE SETS ARE DRAWN IS UNCERTAIN. PLEASE NOTIFY THE MICROBIOLOGY DEPARTMENT WITHIN ONE WEEK IF SPECIATION AND SENSITIVITIES ARE REQUIRED. Performed at [facility], [place] [address], [place], [place] [address]    Report Status 04/27/2020 FINAL  Final  Blood Culture ID Panel (Reflexed)     Status: Abnormal   Collection Time: 04/24/20  4:47 PM  Result Value Ref Range Status   Enterococcus faecalis NOT DETECTED NOT DETECTED Final   Enterococcus Faecium NOT DETECTED NOT DETECTED Final   Listeria monocytogenes NOT DETECTED NOT DETECTED Final   Staphylococcus species DETECTED (A) NOT DETECTED Final    Comment: CRITICAL RESULT CALLED TO, READ BACK BY AND VERIFIED WITH: D. WOFFORD PHARMD, AT 1758 04/25/20 BY D. VANHOOK    Staphylococcus aureus (BCID) NOT DETECTED NOT DETECTED Final   Staphylococcus epidermidis NOT DETECTED NOT DETECTED Final   Staphylococcus lugdunensis NOT DETECTED NOT DETECTED Final   Streptococcus species NOT DETECTED NOT DETECTED Final   Streptococcus agalactiae NOT DETECTED NOT DETECTED Final   Streptococcus pneumoniae NOT DETECTED NOT DETECTED Final   Streptococcus pyogenes NOT DETECTED NOT DETECTED Final   A.calcoaceticus-baumannii NOT DETECTED NOT DETECTED Final   Bacteroides fragilis NOT DETECTED NOT DETECTED Final   Enterobacterales NOT DETECTED NOT DETECTED Final   Enterobacter cloacae complex NOT DETECTED  NOT DETECTED Final   Escherichia coli NOT DETECTED NOT  DETECTED Final   Klebsiella aerogenes NOT DETECTED NOT DETECTED Final   Klebsiella oxytoca NOT DETECTED NOT DETECTED Final   Klebsiella pneumoniae NOT DETECTED NOT DETECTED Final   Proteus species NOT DETECTED NOT DETECTED Final   Salmonella species NOT DETECTED NOT DETECTED Final   Serratia marcescens NOT DETECTED NOT DETECTED Final   Haemophilus influenzae NOT DETECTED NOT DETECTED Final   Neisseria meningitidis NOT DETECTED NOT DETECTED Final   Pseudomonas aeruginosa NOT DETECTED NOT DETECTED Final   Stenotrophomonas maltophilia NOT DETECTED NOT DETECTED Final   Candida albicans NOT DETECTED NOT DETECTED Final   Candida auris NOT DETECTED NOT DETECTED Final   Candida glabrata NOT DETECTED NOT DETECTED Final   Candida krusei NOT DETECTED NOT DETECTED Final   Candida parapsilosis NOT DETECTED NOT DETECTED Final   Candida tropicalis NOT DETECTED NOT DETECTED Final   Cryptococcus neoformans/gattii NOT DETECTED NOT DETECTED Final    Comment: Performed at [facility], [place] [address] [place], [place] [address]  Blood culture (routine x 2)     Status: None   Collection Time: 04/24/20  5:04 PM   Specimen: BLOOD  Result Value Ref Range Status   Specimen Description   Final    BLOOD BLOOD LEFT FOREARM Performed at [facility] [address], [place], [place] [address]    Special Requests   Final    BOTTLES DRAWN AEROBIC AND ANAEROBIC Blood Culture adequate volume Performed at [facility] [address] [place], [place] [address]    Culture   Final    NO GROWTH 5 DAYS Performed at [facility], [place] [address], [place],  [address]    Report Status 04/29/2020 FINAL  Final      Radiology Studies: No results found.  Scheduled Meds: . atorvastatin  20 mg Oral Daily  . carvedilol  6.25 mg Oral BID WC  . donepezil  10 mg Oral Daily  . enoxaparin (LOVENOX) injection  60 mg Subcutaneous Q24H  . FLUoxetine  10 mg Oral Daily    . furosemide  80 mg Intravenous TID AC  . lacosamide  200 mg Oral BID  . losartan  50 mg Oral Daily  . metoCLOPramide  5 mg Oral TID AC  . potassium chloride  40 mEq Oral BID  . spironolactone  100 mg Oral Daily   Continuous Infusions:   LOS: 7 days   Time spent: 30 minutes.  Darliss Cheney, MD [facility] [email or web address] 05/01/2020, 11:33 AM

## 2020-05-01 NOTE — Progress Notes (Signed)
Physical Therapy Treatment Patient Details Name: NALLELY YOST MRN: [id number] DOB: 27-Nov-1960 Today's Date: 05/01/2020    History of Present Illness 59 y.o. female with medical history significant of diabetes, chronic diastolic heart failure, sarcoidosis -oxygen dependent, CHF, DM, BPPV, obesity and admitted for Acute on chronic diastolic congestive heart failure with anasarca    PT Comments    Assisted back to bed required + 2 Total Assist.  General transfer comment: Pt was only able to tolerate OOB in recliner approx 40 min mainly due to back pain and breathing "I feel like I am being squeezed". Assisted back to bed same fashion therapist in front and second assist in bed to use pad to laterally scoot from drop arm recliner 1/4 turn back to bed in segments of three (8 to 10 inched at a time)  Assisted with hygiene after a BM rolling side to side Max Assist + 2 then positioned to comfort.   Pt has a hospital Bed at home and has not been able to get up and out of it for more than 2 weeks, per spouse.    Follow Up Recommendations  SNF     Equipment Recommendations       Recommendations for Other Services       Precautions / Restrictions Precautions Precautions: Fall Precaution Comments: 3.5 L O2 [place] baseline    Mobility  Bed Mobility Overal bed mobility: Needs Assistance Bed Mobility: Sit to Supine       Sit to supine: Total assist;+2 for physical assistance;+2 for safety/equipment   General bed mobility comments: pt required Total Assist + 2 to return to bed.  Pt < 10% able due to body habitus, edema and weakness.  Transfers Overall transfer level: Needs assistance Equipment used: None Transfers: Lateral/Scoot Transfers   Stand pivot transfers: Total assist;+2 physical assistance;+2 safety/equipment;From elevated surface       General transfer comment: Pt was only able to tolerate OOB in recliner approx 40 min mainly due to back pain and breathing "I feel like I am  being squeezed". Assisted back to bed same fashion therapist in front and second assist in bed to use pad to laterally scoot from drop arm recliner 1/4 turn back to bed in segments of three (8 to 10 inched at a time)  Ambulation/Gait             General Gait Details: pt has been non amb for > 6 months   Stairs             Wheelchair Mobility    Modified Rankin (Stroke Patients Only)       Balance                                            Cognition Arousal/Alertness: Awake/alert Behavior During Therapy: WFL for tasks assessed/performed Overall Cognitive Status: Within Functional Limits for tasks assessed                                 General Comments: AxO x 3 very pleasant and willing but also very fearful of falling or being "dropped"  Pt is a retired Therapist, 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  Name is pronounced like Ty-na      Exercises      General Comments  Pertinent Vitals/Pain Pain Assessment: Faces Faces Pain Scale: Hurts a little bit Pain Location: chronic back pain Pain Descriptors / Indicators: Aching;Discomfort;Grimacing Pain Intervention(s): Monitored during session;Repositioned    Home Living                      Prior Function            PT Goals (current goals can now be found in the care plan section) Progress towards PT goals: Progressing toward goals    Frequency    Min 2X/week      PT Plan Current plan remains appropriate    Co-evaluation              AM-PAC PT "6 Clicks" Mobility   Outcome Measure  Help needed turning from your back to your side while in a flat bed without using bedrails?: Total Help needed moving from lying on your back to sitting on the side of a flat bed without using bedrails?: Total Help needed moving to and from a bed to a chair (including a wheelchair)?: Total Help needed standing up from a chair using your arms (e.g., wheelchair or  bedside chair)?: Total Help needed to walk in hospital room?: Total Help needed climbing 3-5 steps with a railing? : Total 6 Click Score: 6    End of Session Equipment Utilized During Treatment: Oxygen;Gait belt Activity Tolerance: Patient limited by fatigue Patient left: in bed Nurse Communication: Mobility status PT Visit Diagnosis: Muscle weakness (generalized) (M62.81);Other abnormalities of gait and mobility (R26.89)     Time: 1150-1210 PT Time Calculation (min) (ACUTE ONLY): 20 min  Charges:  $Therapeutic Activity: 8-22 mins                     Rica Koyanagi  PTA Acute  Rehabilitation Services Pager      [phone] Office      [phone]

## 2020-05-01 NOTE — Progress Notes (Signed)
Physical Therapy Treatment Patient Details Name: Marissa Wilkerson MRN: [id number] DOB: 02/04/1961 Today's Date: 05/01/2020    History of Present Illness 59 y.o. 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  Name is pronounced like Ty-na  General bed mobility comments: pt required Total Assist + 2 to transition to EOB.  Very limited self ability due to whole body edema and ABD girth.  Pt sat EOB x 4 min at Mod assist with initial c/o dizziness which decreased with rest time.  VERY fearful of falling forward.  Therapist remained in front to guard.General transfer comment: at home pt would hold around his neck and stand pivot but has not been able to do so for about 2 weels, stated spouse.  Pt has been staying in her hospital bed.  Attempted same.  "Bear Hug" 1/4 pivot with therapist in front and NT in back to assist with lifting and guiding hips.  Pt was unable to completely stand so in 4 segmental partial stance was able to laterally scoot to drop arm recliner. Positioned in recliner partially upright. Remained on 3 lts nasal.  Spouse in room during session and very helpful.  Rec nursing use LIFT for any OOB.    Follow Up Recommendations  SNF     Equipment Recommendations       Recommendations for Other Services       Precautions / Restrictions Precautions Precautions: Fall Precaution Comments: 3.5 L O2 [place] baseline    Mobility  Bed Mobility Overal bed mobility: Needs Assistance Bed Mobility: Supine to Sit       Sit to supine: Total assist;+2 for physical assistance;+2 for safety/equipment   General bed mobility comments: pt  required Total Assist + 2 to transition to EOB.  Very limited self ability due to whole body edema and ABD girth.  Pt sat EOB x 4 min at Mod assist with initial c/o dizziness which decreased with rest time.  VERY fearful of falling forward.  Therapist remained in front to guard.  Transfers Overall transfer level: Needs assistance Equipment used: None Transfers: Lateral/Scoot Transfers   Stand pivot transfers: Total assist;+2 physical assistance;+2 safety/equipment;From elevated surface       General transfer comment: at home pt would hold around his neck and stand pivot but has not been able to do so for about 2 weels, stated spouse.  Pt has been staying in her hospital bed.  Attempted same.  "Bear Hug" 1/4 pivot with therapist in front and NT in back to assist with lifting and guiding hips.  Pt was unable to completely stand so in 4 segmental partial stance was able to laterally scoot to drop arm recliner.  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  Name is pronounced like Ty-na      Exercises      General Comments        Pertinent Vitals/Pain Pain Assessment: Faces Faces Pain Scale: Hurts a little bit Pain Location: chronic back pain Pain Descriptors / Indicators: Aching;Discomfort;Grimacing Pain Intervention(s): Monitored during session;Repositioned    Home Living                       Prior Function            PT Goals (current goals can now be found in the care plan section) Progress towards PT goals: Progressing toward goals    Frequency    Min 2X/week      PT Plan Current plan remains appropriate    Co-evaluation              AM-PAC PT "6 Clicks" Mobility   Outcome Measure  Help needed turning from your back to your side while in a flat bed without using bedrails?: Total Help needed moving from lying on your back to sitting on the side of a flat bed without using bedrails?: Total Help needed moving to and from a bed to a chair (including a wheelchair)?: Total Help needed standing up from a chair using your arms (e.g., wheelchair or bedside chair)?: Total Help needed to walk in hospital room?: Total Help needed climbing 3-5 steps with a railing? : Total 6 Click Score: 6    End of Session Equipment Utilized During Treatment: Oxygen;Gait belt Activity Tolerance: Patient limited by fatigue Patient left: in chair;with family/visitor present;with call bell/phone within reach Nurse Communication: Mobility status PT Visit Diagnosis: Muscle weakness (generalized) (M62.81);Other abnormalities of gait and mobility (R26.89)     Time: 1740-8144 PT Time Calculation (min) (ACUTE ONLY): 30 min  Charges:  $Therapeutic Activity: 23-37 mins                     Rica Koyanagi  PTA Acute  Rehabilitation Services Pager      [phone] Office      [phone]

## 2020-05-02 LAB — COMPREHENSIVE METABOLIC PANEL
ALT: 11 U/L (ref 0–44)
AST: 13 U/L — ABNORMAL LOW (ref 15–41)
Albumin: 1.8 g/dL — ABNORMAL LOW (ref 3.5–5.0)
Alkaline Phosphatase: 59 U/L (ref 38–126)
Anion gap: 7 (ref 5–15)
BUN: 30 mg/dL — ABNORMAL HIGH (ref 6–20)
CO2: 32 mmol/L (ref 22–32)
Calcium: 8.1 mg/dL — ABNORMAL LOW (ref 8.9–10.3)
Chloride: 98 mmol/L (ref 98–111)
Creatinine, Ser: 1.32 mg/dL — ABNORMAL HIGH (ref 0.44–1.00)
GFR calc Af Amer: 52 mL/min — ABNORMAL LOW (ref >60)
GFR calc non Af Amer: 45 mL/min — ABNORMAL LOW (ref >60)
Glucose, Bld: 93 mg/dL (ref 70–99)
Potassium: 5.5 mmol/L — ABNORMAL HIGH (ref 3.5–5.1)
Sodium: 137 mmol/L (ref 135–145)
Total Bilirubin: 0.6 mg/dL (ref 0.3–1.2)
Total Protein: 5.6 g/dL — ABNORMAL LOW (ref 6.5–8.1)

## 2020-05-02 LAB — CBC WITH DIFFERENTIAL/PLATELET
Abs Immature Granulocytes: 0.07 10*3/uL (ref 0.00–0.07)
Basophils Absolute: 0 10*3/uL (ref 0.0–0.1)
Basophils Relative: 0 %
Eosinophils Absolute: 0.1 10*3/uL (ref 0.0–0.5)
Eosinophils Relative: 1 %
HCT: 25.5 % — ABNORMAL LOW (ref 36.0–46.0)
Hemoglobin: 8.1 g/dL — ABNORMAL LOW (ref 12.0–15.0)
Immature Granulocytes: 1 %
Lymphocytes Relative: 10 %
Lymphs Abs: 0.8 10*3/uL (ref 0.7–4.0)
MCH: 30.7 pg (ref 26.0–34.0)
MCHC: 31.8 g/dL (ref 30.0–36.0)
MCV: 96.6 fL (ref 80.0–100.0)
Monocytes Absolute: 1.2 10*3/uL — ABNORMAL HIGH (ref 0.1–1.0)
Monocytes Relative: 14 %
Neutro Abs: 6.4 10*3/uL (ref 1.7–7.7)
Neutrophils Relative %: 74 %
Platelets: 149 10*3/uL — ABNORMAL LOW (ref 150–400)
RBC: 2.64 MIL/uL — ABNORMAL LOW (ref 3.87–5.11)
RDW: 13.5 % (ref 11.5–15.5)
WBC: 8.6 10*3/uL (ref 4.0–10.5)
nRBC: 0 % (ref 0.0–0.2)

## 2020-05-02 LAB — GLUCOSE, CAPILLARY
Glucose-Capillary: 82 mg/dL (ref 70–99)
Glucose-Capillary: 103 mg/dL — ABNORMAL HIGH (ref 70–99)
Glucose-Capillary: 106 mg/dL — ABNORMAL HIGH (ref 70–99)
Glucose-Capillary: 105 mg/dL — ABNORMAL HIGH (ref 70–99)

## 2020-05-02 LAB — SARS CORONAVIRUS 2 BY RT PCR (HOSPITAL ORDER, PERFORMED IN ~~LOC~~ HOSPITAL LAB): SARS Coronavirus 2: NEGATIVE

## 2020-05-02 LAB — MAGNESIUM: Magnesium: 2.1 mg/dL (ref 1.7–2.4)

## 2020-05-02 NOTE — Progress Notes (Signed)
PROGRESS NOTE  CASSADIE PANKONIN  TML:[id number] DOB: [birth year] DOA: 04/24/2020 PCP: Jani Gravel, MD  Outpatient Specialists: Cardiology, Dr. Gwenlyn Found Brief Narrative: ROXIE KREEGER is a 59 y.o. 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 CXR revealed diffuse bilateral interstitial opacities consistent with pulmonary edema and left pleural effusion with cardiomegaly. Imaging also suggestive of new diagnosis hepatic cirrhosis.  She was admitted to hospitalist service.  Echo 8/29 with LVEF 60-65%, indeterminate diastolic parameters, no WMA's, normal RV, mild-moderate AS mean gradient 48mmHg (unchanged from report of echo Jan 2019).  She was initially started on medium dose of IV Lasix.  Her renal function remained stable.  There was not enough diuresis so dose was increased to 80 mg IV twice daily and Aldactone was added.  She was also diagnosed with UTI for which she completed treatment with ceftriaxone.  Assessment & Plan: Principal Problem:   CHF exacerbation ([place]) Active Problems:   Hypertension   Diabetes mellitus without complication ([place])   Diastolic dysfunction   Sarcoidosis of lung (HCC)   Hyperlipidemia   CHF (congestive heart failure), NYHA class I, unspecified failure chronicity, diastolic (HCC)   History of seizure   Pressure injury of skin  Acute on chronic HFpEF with anasarca: Echo 8/29 with LVEF 60-65%, indeterminate diastolic parameters, no WMA's, normal RV, mild-moderate AS mean gradient 70mmHg (unchanged from report of echo Jan 2019). Exacerbated by hypoalbuminemia likely related to cirrhosis. BNP 264.5.  Has had only about 1 L of diuresis in the last 24 hours once again per chart, unless there is discrepancy in charting.  She states that she feels better than yesterday.   Will continue Lasix to 80 mg 3 times daily.  Continue same dose of Aldactone.   Chronic hypoxemic respiratory failure due to pulmonary sarcoidosis with acute pulmonary edema: Baseline oxygen usage is 4 L via nasal cannula, currently on 3.5 L.  Off MTX since 2018. - Follow up with pulmonology, Dr. Lamonte Sakai, as outpatient.  Hepatic cirrhosis: ?If related to sarcoid.  - INR 1.2, albumin 1.5, TBili 0.3.  - Check serologies, pending.  HCV Ab neg, HIV NR.  - Continue lasix, spironolactone as above - Na restriction - Low threshold for lactulose. Ammonia initially elevated to 38, down to 25. - Consider paracentesis if develops abdominal discomfort.  Hypoalbuminemia: Impaired synthetic function and proteinuria contributing. - Quantify proteinuria. Noted to have proteinuria dating to 2019.   Hypokalemia: Resolved.  Hypokalemia: Interestingly, despite of receiving heavy dose of Lasix, she is hyperkalemic with potassium of 5.5.  I expect this to be normal with current dose of Lasix.  Hypomagnesemia: Resolved  CKd stage IIIa: At baseline.  Monitor.  Seizure disorder:  - Continue vimpat, continue [facility] follow up as outpatient  IDT2DM: HbA1c 4.6%, possibly developing impairment in gluconeogenesis.  SSI discontinued.  HTN crisis: POA, resolved.  Blood pressure stable and improved. - Continue antihypertensive regimen and volume removal.   Dementia, psychosis?:  - Continue home medications aricept and prozac.  She is alert and oriented.  Staphylococcus capitis in 1 blood culture: Suspected contaminant.  - Monitor clinically without targeted Tx.  E. coli UTI:  - Completed Tx w/ceftriaxone  Obesity: Estimated body mass index is 44.17 kg/m as calculated from the following:   Height as of this encounter: 5\' 3"  (1.6 m).  Weight as of this encounter: 113.1 kg.   Breast mass: Exam benign per previous hospitalist.  - OP mammogram per routine.   DVT prophylaxis: Lovenox Code Status:  Full Family Communication: Husband at the bedside today.  Plan of care discussed with both of them. Disposition Plan:  Status is: Inpatient  Remains inpatient appropriate because:IV treatments appropriate due to intensity of illness or inability to take PO. Diffuse volume overload refractory to IV diuresis.   Dispo: The patient is from: Home              Anticipated d/c is to: SNF              Anticipated d/c date is: 05/03/2020.  Will order COVID-19 test today.              Patient currently is not medically stable to d/c.  Consultants:   None  Procedures:   None  Antimicrobials:  Ceftriaxone 8/28 - 8/30   Subjective: Patient seen and examined.  She states that she feels better than yesterday.  No new complaint.  Husband at the bedside.  Objective: Vitals:   05/01/20 0500 05/01/20 1405 05/01/20 1950 05/02/20 0443  BP: (!) 167/67 (!) 144/57 (!) 143/68 135/66  Pulse: 66 (!) 59 62 69  Resp: 20 12 20 18   Temp: 97.7 F (36.5 C) 97.6 F (36.4 C) 97.9 F (36.6 C) 97.6 F (36.4 C)  TempSrc: Oral Oral Oral Oral  SpO2: 98% 99% 100% 97%  Weight:      Height:        Intake/Output Summary (Last 24 hours) at 05/02/2020 1136 Last data filed at 05/02/2020 1000 Gross per 24 hour  Intake 720 ml  Output 1050 ml  Net -330 ml   Filed Weights   04/28/20 0234 04/29/20 0500 05/01/20 0435  Weight: 115.1 kg 114.4 kg 113.1 kg    General exam: Appears calm and comfortable  Respiratory system: Clear to auscultation. Respiratory effort normal. Cardiovascular system: S1 & S2 heard, RRR. No JVD, murmurs, rubs, gallops or clicks.  +3 pitting edema bilateral lower extremity Gastrointestinal system: Abdomen is nondistended, soft and nontender. No organomegaly or masses felt. Normal bowel sounds heard. Central nervous system: Alert and oriented. No focal neurological deficits. Extremities: Symmetric 5 x 5 power. Skin: No rashes, lesions or ulcers.  Psychiatry: Judgement and insight appear  normal. Mood & affect appropriate.   Data Reviewed: I have personally reviewed following labs and imaging studies  CBC: Recent Labs  Lab 04/27/20 0833 04/28/20 0721 04/29/20 0830 05/01/20 0519 05/02/20 0422  WBC 6.2 6.8 7.2 8.1 8.6  NEUTROABS 4.7 5.3 5.6 6.1 6.4  HGB 8.0* 8.3* 8.0* 7.7* 8.1*  HCT 26.2* 26.6* 25.2* 24.7* 25.5*  MCV 97.4 96.4 95.8 96.5 96.6  PLT 148* 163 150 160 892*   Basic Metabolic Panel: Recent Labs  Lab 04/26/20 1011 04/26/20 1011 04/27/20 0833 04/28/20 0721 04/29/20 0830 05/01/20 0519 05/02/20 0422  NA 137   < > 137 136 135 135 137  K 3.4*   < > 3.2* 3.2* 3.7 4.7 5.5*  CL 101   < > 98 98 98 97* 98  CO2 31   < > 31 28 30  32 32  GLUCOSE 99   < > 95 98 96 100* 93  BUN 19   < > 21* 21* 22* 28* 30*  CREATININE 1.07*   < > 1.23* 1.15* 1.16* 1.30* 1.32*  CALCIUM 7.4*   < > 7.3* 7.6* 7.6* 7.8* 8.1*  MG 1.5*  --   --  1.7  --  1.6* 2.1   < > = values in this interval not displayed.   GFR: Estimated Creatinine Clearance: 56.9 mL/min (A) (by C-G formula based on SCr of 1.32 mg/dL (H)). Liver Function Tests: Recent Labs  Lab 04/27/20 0833 04/28/20 0721 04/29/20 0830 05/01/20 0519 05/02/20 0422  AST 16 18 17  14* 13*  ALT 10 11 11 9 11   ALKPHOS 56 55 56 53 59  BILITOT 0.2* 0.5 0.3 0.3 0.6  PROT 5.3* 5.5* 5.3* 5.5* 5.6*  ALBUMIN 1.4* 1.5* 1.5* 1.7* 1.8*   No results for input(s): LIPASE, AMYLASE in the last 168 hours. Recent Labs  Lab 04/27/20 0833  AMMONIA 25   Coagulation Profile: No results for input(s): INR, PROTIME in the last 168 hours. Cardiac Enzymes: No results for input(s): CKTOTAL, CKMB, CKMBINDEX, TROPONINI in the last 168 hours. BNP (last 3 results) No results for input(s): PROBNP in the last 8760 hours. HbA1C: No results for input(s): HGBA1C in the last 72 hours. CBG: Recent Labs  Lab 05/01/20 1220 05/01/20 1647 05/01/20 1955 05/02/20 0726 05/02/20 1134  GLUCAP 99 96 126* 82 103*   Lipid Profile: No results for  input(s): CHOL, HDL, LDLCALC, TRIG, CHOLHDL, LDLDIRECT in the last 72 hours. Thyroid Function Tests: No results for input(s): TSH, T4TOTAL, FREET4, T3FREE, THYROIDAB in the last 72 hours. Anemia Panel: No results for input(s): VITAMINB12, FOLATE, FERRITIN, TIBC, IRON, RETICCTPCT in the last 72 hours. Urine analysis:    Component Value Date/Time   COLORURINE AMBER (A) 04/24/2020 1407   APPEARANCEUR CLOUDY (A) 04/24/2020 1407   LABSPEC 1.017 04/24/2020 1407   PHURINE 5.0 04/24/2020 1407   GLUCOSEU NEGATIVE 04/24/2020 1407   HGBUR MODERATE (A) 04/24/2020 1407   BILIRUBINUR NEGATIVE 04/24/2020 1407   KETONESUR NEGATIVE 04/24/2020 1407   PROTEINUR >=300 (A) 04/24/2020 1407   UROBILINOGEN 1.0 03/03/2014 0942   NITRITE NEGATIVE 04/24/2020 1407   LEUKOCYTESUR MODERATE (A) 04/24/2020 1407   Recent Results (from the past 240 hour(s))  Urine culture     Status: Abnormal   Collection Time: 04/24/20  2:07 PM   Specimen: Urine, Random  Result Value Ref Range Status   Specimen Description   Final    URINE, RANDOM Performed at [facility], [place] [address], [place], [place] [address]    Special Requests   Final    NONE Performed at [facility], [place] [address] [place], [place] [address]    Culture >=100,000 COLONIES/mL ESCHERICHIA COLI (A)  Final   Report Status 04/26/2020 FINAL  Final   Organism ID, Bacteria ESCHERICHIA COLI (A)  Final      Susceptibility   Escherichia coli - MIC*    AMPICILLIN >=32 RESISTANT Resistant     CEFAZOLIN <=4 SENSITIVE Sensitive     CEFTRIAXONE <=0.25 SENSITIVE Sensitive     CIPROFLOXACIN >=4 RESISTANT Resistant     GENTAMICIN <=1 SENSITIVE Sensitive     IMIPENEM <=0.25 SENSITIVE Sensitive     NITROFURANTOIN <=16 SENSITIVE Sensitive     TRIMETH/SULFA >=320 RESISTANT Resistant     AMPICILLIN/SULBACTAM >=32 RESISTANT Resistant     PIP/TAZO 8 SENSITIVE Sensitive     * >=100,000 COLONIES/mL ESCHERICHIA COLI  SARS  Coronavirus 2 by RT PCR (hospital order, performed in [place] hospital lab) Nasopharyngeal Nasopharyngeal Swab     Status: None   Collection Time: 04/24/20  3:33 PM   Specimen: Nasopharyngeal Swab  Result Value Ref Range  Status   SARS Coronavirus 2 NEGATIVE NEGATIVE Final    Comment: (NOTE) SARS-CoV-2 target nucleic acids are NOT DETECTED.  The SARS-CoV-2 RNA is generally detectable in upper and lower respiratory specimens during the acute phase of infection. The lowest concentration of SARS-CoV-2 viral copies this assay can detect is 250 copies / mL. A negative result does not preclude SARS-CoV-2 infection and should not be used as the sole basis for treatment or other patient management decisions.  A negative result may occur with improper specimen collection / handling, submission of specimen other than nasopharyngeal swab, presence of viral mutation(s) within the areas targeted by this assay, and inadequate number of viral copies (<250 copies / mL). A negative result must be combined with clinical observations, patient history, and epidemiological information.  Fact Sheet for Patients:   StrictlyIdeas.no  Fact Sheet for Healthcare Providers: BankingDealers.co.za  This test is not yet approved or  cleared by the [place] FDA and has been authorized for detection and/or diagnosis of SARS-CoV-2 by FDA under an Emergency Use Authorization (EUA).  This EUA will remain in effect (meaning this test can be used) for the duration of the COVID-19 declaration under Section 564(b)(1) of the Act, 21 U.S.C. section 360bbb-3(b)(1), unless the authorization is terminated or revoked sooner.  Performed at [facility], [place] [address], [place], [place] [address]   Blood culture (routine x 2)     Status: Abnormal   Collection Time: 04/24/20  4:47 PM   Specimen: BLOOD  Result Value Ref Range Status   Specimen  Description   Final    BLOOD RIGHT ANTECUBITAL Performed at [place] [address], [place], [place] [address]    Special Requests   Final    BOTTLES DRAWN AEROBIC AND ANAEROBIC Blood Culture adequate volume Performed at [place] [address], [place], [place] [address]    Culture  Setup Time   Final    GRAM POSITIVE COCCI IN CLUSTERS AEROBIC BOTTLE ONLY Organism ID to follow CRITICAL RESULT CALLED TO, READ BACK BY AND VERIFIED WITH: D. WOFFORD PHARMD, AT [id number] 04/25/20 BY D. VANHOOK    Culture (A)  Final    STAPHYLOCOCCUS CAPITIS THE SIGNIFICANCE OF ISOLATING THIS ORGANISM FROM A SINGLE SET OF BLOOD CULTURES WHEN MULTIPLE SETS ARE DRAWN IS UNCERTAIN. PLEASE NOTIFY THE MICROBIOLOGY DEPARTMENT WITHIN ONE WEEK IF SPECIATION AND SENSITIVITIES ARE REQUIRED. Performed at [facility], [place] [address], [place],  [address]    Report Status 04/27/2020 FINAL  Final  Blood Culture ID Panel (Reflexed)     Status: Abnormal   Collection Time: 04/24/20  4:47 PM  Result Value Ref Range Status   Enterococcus faecalis NOT DETECTED NOT DETECTED Final   Enterococcus Faecium NOT DETECTED NOT DETECTED Final   Listeria monocytogenes NOT DETECTED NOT DETECTED Final   Staphylococcus species DETECTED (A) NOT DETECTED Final    Comment: CRITICAL RESULT CALLED TO, READ BACK BY AND VERIFIED WITH: D. WOFFORD PHARMD, AT 1758 04/25/20 BY D. VANHOOK    Staphylococcus aureus (BCID) NOT DETECTED NOT DETECTED Final   Staphylococcus epidermidis NOT DETECTED NOT DETECTED Final   Staphylococcus lugdunensis NOT DETECTED NOT DETECTED Final   Streptococcus species NOT DETECTED NOT DETECTED Final   Streptococcus agalactiae NOT DETECTED NOT DETECTED Final   Streptococcus pneumoniae NOT DETECTED NOT DETECTED Final   Streptococcus pyogenes NOT DETECTED NOT DETECTED Final   A.calcoaceticus-baumannii NOT DETECTED NOT DETECTED Final   Bacteroides fragilis NOT  DETECTED  NOT DETECTED Final   Enterobacterales NOT DETECTED NOT DETECTED Final   Enterobacter cloacae complex NOT DETECTED NOT DETECTED Final   Escherichia coli NOT DETECTED NOT DETECTED Final   Klebsiella aerogenes NOT DETECTED NOT DETECTED Final   Klebsiella oxytoca NOT DETECTED NOT DETECTED Final   Klebsiella pneumoniae NOT DETECTED NOT DETECTED Final   Proteus species NOT DETECTED NOT DETECTED Final   Salmonella species NOT DETECTED NOT DETECTED Final   Serratia marcescens NOT DETECTED NOT DETECTED Final   Haemophilus influenzae NOT DETECTED NOT DETECTED Final   Neisseria meningitidis NOT DETECTED NOT DETECTED Final   Pseudomonas aeruginosa NOT DETECTED NOT DETECTED Final   Stenotrophomonas maltophilia NOT DETECTED NOT DETECTED Final   Candida albicans NOT DETECTED NOT DETECTED Final   Candida auris NOT DETECTED NOT DETECTED Final   Candida glabrata NOT DETECTED NOT DETECTED Final   Candida krusei NOT DETECTED NOT DETECTED Final   Candida parapsilosis NOT DETECTED NOT DETECTED Final   Candida tropicalis NOT DETECTED NOT DETECTED Final   Cryptococcus neoformans/gattii NOT DETECTED NOT DETECTED Final    Comment: Performed at [facility], [place] [address] [place], [place] [address]  Blood culture (routine x 2)     Status: None   Collection Time: 04/24/20  5:04 PM   Specimen: BLOOD  Result Value Ref Range Status   Specimen Description   Final    BLOOD BLOOD LEFT FOREARM Performed at [facility] [address] [place], [place] [address]    Special Requests   Final    BOTTLES DRAWN AEROBIC AND ANAEROBIC Blood Culture adequate volume Performed at [place] [address], [place], [place] [address]    Culture   Final    NO GROWTH 5 DAYS Performed at [facility], [place] [address], [place], [place] [address]    Report Status 04/29/2020 FINAL  Final      Radiology Studies: No results found.  Scheduled Meds: . atorvastatin  20 mg  Oral Daily  . carvedilol  6.25 mg Oral BID WC  . donepezil  10 mg Oral Daily  . enoxaparin (LOVENOX) injection  60 mg Subcutaneous Q24H  . FLUoxetine  10 mg Oral Daily  . furosemide  80 mg Intravenous TID AC  . lacosamide  200 mg Oral BID  . metoCLOPramide  5 mg Oral TID AC   Continuous Infusions:   LOS: 8 days   Time spent: 29 minutes.  Darliss Cheney, MD [facility] [email or web address] 05/02/2020, 11:36 AM

## 2020-05-03 LAB — GLUCOSE, CAPILLARY
Glucose-Capillary: 83 mg/dL (ref 70–99)
Glucose-Capillary: 98 mg/dL (ref 70–99)
Glucose-Capillary: 131 mg/dL — ABNORMAL HIGH (ref 70–99)
Glucose-Capillary: 127 mg/dL — ABNORMAL HIGH (ref 70–99)

## 2020-05-03 LAB — BASIC METABOLIC PANEL
Anion gap: 8 (ref 5–15)
BUN: 31 mg/dL — ABNORMAL HIGH (ref 6–20)
CO2: 30 mmol/L (ref 22–32)
Calcium: 8 mg/dL — ABNORMAL LOW (ref 8.9–10.3)
Chloride: 93 mmol/L — ABNORMAL LOW (ref 98–111)
Creatinine, Ser: 1.29 mg/dL — ABNORMAL HIGH (ref 0.44–1.00)
GFR calc Af Amer: 53 mL/min — ABNORMAL LOW (ref >60)
GFR calc non Af Amer: 46 mL/min — ABNORMAL LOW (ref >60)
Glucose, Bld: 93 mg/dL (ref 70–99)
Potassium: 4.8 mmol/L (ref 3.5–5.1)
Sodium: 131 mmol/L — ABNORMAL LOW (ref 135–145)

## 2020-05-03 NOTE — Plan of Care (Signed)
  Problem: Education: Goal: Knowledge of General Education information will improve Description: Including pain rating scale, medication(s)/side effects and non-pharmacologic comfort measures Outcome: Progressing   Problem: Nutrition: Goal: Adequate nutrition will be maintained Outcome: Progressing   Problem: Elimination: Goal: Will not experience complications related to bowel motility Outcome: Progressing Goal: Will not experience complications related to urinary retention Outcome: Progressing   Problem: Pain Managment: Goal: General experience of comfort will improve Outcome: Progressing   

## 2020-05-03 NOTE — Progress Notes (Signed)
PROGRESS NOTE  [name]  YOM:[id number] DOB: [birth year] DOA: 04/24/2020 PCP: Jani Gravel, MD  Outpatient Specialists: Cardiology, Dr. Gwenlyn Found Brief Narrative: Marissa Wilkerson is a 59 y.o. female with a history of pulmonary sarcoidosis, chronic hypoxic respiratory failure on 3.5LPM, chronic HFpEF, HTN, IDT2DM, LLE DVT, seizure disorder, reported dementia, and anemia of chronic disease who presented to [facility] 8/28 due to acute exacerbation of CHF. CXR revealed diffuse bilateral interstitial opacities consistent with pulmonary edema and left pleural effusion with cardiomegaly. Imaging also suggestive of new diagnosis hepatic cirrhosis.  She was admitted to hospitalist service.  Echo 8/29 with LVEF 60-65%, indeterminate diastolic parameters, no WMA's, normal RV, mild-moderate AS mean gradient 81mmHg (unchanged from report of echo Jan 2019).  She was initially started on medium dose of IV Lasix.  Her renal function remained stable.  There was not enough diuresis so dose was increased to 80 mg IV twice daily and Aldactone was added.  She was also diagnosed with UTI for which she completed treatment with ceftriaxone.  Assessment & Plan: Principal Problem:   CHF exacerbation ([place]) Active Problems:   Hypertension   Diabetes mellitus without complication ([place])   Diastolic dysfunction   Sarcoidosis of lung (HCC)   Hyperlipidemia   CHF (congestive heart failure), NYHA class I, unspecified failure chronicity, diastolic (HCC)   History of seizure   Pressure injury of skin  Acute on chronic HFpEF with anasarca: Echo 8/29 with LVEF 60-65%, indeterminate diastolic parameters, no WMA's, normal RV, mild-moderate AS mean gradient 6mmHg (unchanged from report of echo Jan 2019). Exacerbated by hypoalbuminemia likely related to cirrhosis. BNP 264.5.  Has had much better diuresis with about 2700 cc out with net -7.7 L so far.  She still has significant bilateral lower extremity edema and I think she will benefit from  more IV diuresis at least for 1 to 2 days and for this reason, we will cancel her discharge and continue her aggressive diuresis with Lasix 80 mg 3 times daily and Aldactone.  Chronic hypoxemic respiratory failure due to pulmonary sarcoidosis with acute pulmonary edema: Baseline oxygen usage is 4 L via nasal cannula, currently on 3.5 L.  Off MTX since 2018. - Follow up with pulmonology, Dr. Lamonte Sakai, as outpatient.  Hepatic cirrhosis: ?If related to sarcoid.  - INR 1.2, albumin 1.5, TBili 0.3.  - Check serologies, pending.  HCV Ab neg, HIV NR.  - Continue lasix, spironolactone as above - Na restriction - Low threshold for lactulose. Ammonia initially elevated to 38, down to 25. - Consider paracentesis if develops abdominal discomfort.  Hypoalbuminemia: Impaired synthetic function and proteinuria contributing. - Quantify proteinuria. Noted to have proteinuria dating to 2019.   Hyperkalemia: Resolved.  Hypokalemia: Resolved  Hypomagnesemia: Resolved  CKd stage IIIa: At baseline.  Monitor.  Seizure disorder:  - Continue vimpat, continue [facility] follow up as outpatient  IDT2DM: HbA1c 4.6%, possibly developing impairment in gluconeogenesis.  SSI discontinued.  HTN crisis: POA, resolved.  Blood pressure stable and improved. - Continue antihypertensive regimen and volume removal.   Dementia, psychosis?:  - Continue home medications aricept and prozac.  She is alert and oriented.  Staphylococcus capitis in 1 blood culture: Suspected contaminant.  - Monitor clinically without targeted Tx.  E. coli UTI:  - Completed Tx w/ceftriaxone  Obesity: Estimated body mass index is 43.7 kg/m as calculated from the following:   Height as of this encounter: 5\' 3"  (1.6 m).   Weight as of this encounter: 111.9 kg.  Breast mass: Exam benign per previous hospitalist.  - OP mammogram per routine.   DVT prophylaxis: Lovenox Code Status: Full Family Communication: No family member present at  bedside.  Plan of care discussed with patient.  She will discuss with her husband. Disposition Plan:  Status is: Inpatient  Remains inpatient appropriate because:IV treatments appropriate due to intensity of illness or inability to take PO. Diffuse volume overload refractory to IV diuresis.   Dispo: The patient is from: Home              Anticipated d/c is to: SNF              Anticipated d/c date is: 9/7-8.               Patient currently is not medically stable to d/c.  Consultants:   None  Procedures:   None  Antimicrobials:  Ceftriaxone 8/28 - 8/30   Subjective: Seen and examined.  Feels better.  No shortness of breath.  Still with significant bilateral lower extremity edema.  Objective: Vitals:   05/02/20 0443 05/02/20 1341 05/02/20 2128 05/03/20 0506  BP: 135/66 (!) 173/57 (!) 166/63 (!) 134/50  Pulse: 69 67 62 64  Resp: 18 (!) 21 20 18   Temp: 97.6 F (36.4 C) (!) 97.5 F (36.4 C) 97.6 F (36.4 C) 97.7 F (36.5 C)  TempSrc: Oral  Oral Oral  SpO2: 97% 100% 100% 99%  Weight:    111.9 kg  Height:        Intake/Output Summary (Last 24 hours) at 05/03/2020 1144 Last data filed at 05/03/2020 0900 Gross per 24 hour  Intake 1200 ml  Output 2203 ml  Net -1003 ml   Filed Weights   04/29/20 0500 05/01/20 0435 05/03/20 0506  Weight: 114.4 kg 113.1 kg 111.9 kg   General exam: Appears calm and comfortable  Respiratory system: Clear to auscultation. Respiratory effort normal. Cardiovascular system: S1 & S2 heard, RRR. No JVD, murmurs, rubs, gallops or clicks.  +3 pitting edema bilateral lower extremity. Gastrointestinal system: Abdomen is nondistended, soft and nontender. No organomegaly or masses felt. Normal bowel sounds heard. Central nervous system: Alert and oriented. No focal neurological deficits. Extremities: Symmetric 5 x 5 power. Skin: No rashes, lesions or ulcers.  Psychiatry: Judgement and insight appear normal. Mood & affect appropriate.    Data  Reviewed: I have personally reviewed following labs and imaging studies  CBC: Recent Labs  Lab 04/27/20 0833 04/28/20 0721 04/29/20 0830 05/01/20 0519 05/02/20 0422  WBC 6.2 6.8 7.2 8.1 8.6  NEUTROABS 4.7 5.3 5.6 6.1 6.4  HGB 8.0* 8.3* 8.0* 7.7* 8.1*  HCT 26.2* 26.6* 25.2* 24.7* 25.5*  MCV 97.4 96.4 95.8 96.5 96.6  PLT 148* 163 150 160 242*   Basic Metabolic Panel: Recent Labs  Lab 04/28/20 0721 04/29/20 0830 05/01/20 0519 05/02/20 0422 05/03/20 0438  NA 136 135 135 137 131*  K 3.2* 3.7 4.7 5.5* 4.8  CL 98 98 97* 98 93*  CO2 28 30 32 32 30  GLUCOSE 98 96 100* 93 93  BUN 21* 22* 28* 30* 31*  CREATININE 1.15* 1.16* 1.30* 1.32* 1.29*  CALCIUM 7.6* 7.6* 7.8* 8.1* 8.0*  MG 1.7  --  1.6* 2.1  --    GFR: Estimated Creatinine Clearance: 57.9 mL/min (A) (by C-G formula based on SCr of 1.29 mg/dL (H)). Liver Function Tests: Recent Labs  Lab 04/27/20 0833 04/28/20 0721 04/29/20 0830 05/01/20 0519 05/02/20 0422  AST 16 18 17  14*  13*  ALT 10 11 11 9 11   ALKPHOS 56 55 56 53 59  BILITOT 0.2* 0.5 0.3 0.3 0.6  PROT 5.3* 5.5* 5.3* 5.5* 5.6*  ALBUMIN 1.4* 1.5* 1.5* 1.7* 1.8*   No results for input(s): LIPASE, AMYLASE in the last 168 hours. Recent Labs  Lab 04/27/20 0833  AMMONIA 25   Coagulation Profile: No results for input(s): INR, PROTIME in the last 168 hours. Cardiac Enzymes: No results for input(s): CKTOTAL, CKMB, CKMBINDEX, TROPONINI in the last 168 hours. BNP (last 3 results) No results for input(s): PROBNP in the last 8760 hours. HbA1C: No results for input(s): HGBA1C in the last 72 hours. CBG: Recent Labs  Lab 05/02/20 1134 05/02/20 1715 05/02/20 2123 05/03/20 0726 05/03/20 1117  GLUCAP 103* 106* 105* 83 98   Lipid Profile: No results for input(s): CHOL, HDL, LDLCALC, TRIG, CHOLHDL, LDLDIRECT in the last 72 hours. Thyroid Function Tests: No results for input(s): TSH, T4TOTAL, FREET4, T3FREE, THYROIDAB in the last 72 hours. Anemia Panel: No  results for input(s): VITAMINB12, FOLATE, FERRITIN, TIBC, IRON, RETICCTPCT in the last 72 hours. Urine analysis:    Component Value Date/Time   COLORURINE AMBER (A) 04/24/2020 1407   APPEARANCEUR CLOUDY (A) 04/24/2020 1407   LABSPEC 1.017 04/24/2020 1407   PHURINE 5.0 04/24/2020 1407   GLUCOSEU NEGATIVE 04/24/2020 1407   HGBUR MODERATE (A) 04/24/2020 1407   BILIRUBINUR NEGATIVE 04/24/2020 1407   KETONESUR NEGATIVE 04/24/2020 1407   PROTEINUR >=300 (A) 04/24/2020 1407   UROBILINOGEN 1.0 03/03/2014 0942   NITRITE NEGATIVE 04/24/2020 1407   LEUKOCYTESUR MODERATE (A) 04/24/2020 1407   Recent Results (from the past 240 hour(s))  Urine culture     Status: Abnormal   Collection Time: 04/24/20  2:07 PM   Specimen: Urine, Random  Result Value Ref Range Status   Specimen Description   Final    URINE, RANDOM Performed at [facility], [place] [address], [place], [place] [address]    Special Requests   Final    NONE Performed at [facility], [place] [address], [place], [place] [address]    Culture >=100,000 COLONIES/mL ESCHERICHIA COLI (A)  Final   Report Status 04/26/2020 FINAL  Final   Organism ID, Bacteria ESCHERICHIA COLI (A)  Final      Susceptibility   Escherichia coli - MIC*    AMPICILLIN >=32 RESISTANT Resistant     CEFAZOLIN <=4 SENSITIVE Sensitive     CEFTRIAXONE <=0.25 SENSITIVE Sensitive     CIPROFLOXACIN >=4 RESISTANT Resistant     GENTAMICIN <=1 SENSITIVE Sensitive     IMIPENEM <=0.25 SENSITIVE Sensitive     NITROFURANTOIN <=16 SENSITIVE Sensitive     TRIMETH/SULFA >=320 RESISTANT Resistant     AMPICILLIN/SULBACTAM >=32 RESISTANT Resistant     PIP/TAZO 8 SENSITIVE Sensitive     * >=100,000 COLONIES/mL ESCHERICHIA COLI  SARS Coronavirus 2 by RT PCR (hospital order, performed in [facility] lab) Nasopharyngeal Nasopharyngeal Swab     Status: None   Collection Time: 04/24/20  3:33 PM   Specimen: Nasopharyngeal Swab  Result  Value Ref Range Status   SARS Coronavirus 2 NEGATIVE NEGATIVE Final    Comment: (NOTE) SARS-CoV-2 target nucleic acids are NOT DETECTED.  The SARS-CoV-2 RNA is generally detectable in upper and lower respiratory specimens during the acute phase of infection. The lowest concentration of SARS-CoV-2 viral copies this assay can detect is 250 copies / mL. A negative result does not preclude SARS-CoV-2 infection and should not  be used as the sole basis for treatment or other patient management decisions.  A negative result may occur with improper specimen collection / handling, submission of specimen other than nasopharyngeal swab, presence of viral mutation(s) within the areas targeted by this assay, and inadequate number of viral copies (<250 copies / mL). A negative result must be combined with clinical observations, patient history, and epidemiological information.  Fact Sheet for Patients:   StrictlyIdeas.no  Fact Sheet for Healthcare Providers: BankingDealers.co.za  This test is not yet approved or  cleared by the [place] FDA and has been authorized for detection and/or diagnosis of SARS-CoV-2 by FDA under an Emergency Use Authorization (EUA).  This EUA will remain in effect (meaning this test can be used) for the duration of the COVID-19 declaration under Section 564(b)(1) of the Act, 21 U.S.C. section 360bbb-3(b)(1), unless the authorization is terminated or revoked sooner.  Performed at [facility], [place] [address] [place], [place] [address]   Blood culture (routine x 2)     Status: Abnormal   Collection Time: 04/24/20  4:47 PM   Specimen: BLOOD  Result Value Ref Range Status   Specimen Description   Final    BLOOD RIGHT ANTECUBITAL Performed at [place] [address], [place], [place] [address]    Special Requests   Final    BOTTLES DRAWN AEROBIC AND ANAEROBIC Blood Culture  adequate volume Performed at [place] [address], [place], [place] [address]    Culture  Setup Time   Final    GRAM POSITIVE COCCI IN CLUSTERS AEROBIC BOTTLE ONLY Organism ID to follow CRITICAL RESULT CALLED TO, READ BACK BY AND VERIFIED WITH: D. WOFFORD PHARMD, AT 7341 04/25/20 BY D. VANHOOK    Culture (A)  Final    STAPHYLOCOCCUS CAPITIS THE SIGNIFICANCE OF ISOLATING THIS ORGANISM FROM A SINGLE SET OF BLOOD CULTURES WHEN MULTIPLE SETS ARE DRAWN IS UNCERTAIN. PLEASE NOTIFY THE MICROBIOLOGY DEPARTMENT WITHIN ONE WEEK IF SPECIATION AND SENSITIVITIES ARE REQUIRED. Performed at [facility], [place] [address] [place], [place] [address]    Report Status 04/27/2020 FINAL  Final  Blood Culture ID Panel (Reflexed)     Status: Abnormal   Collection Time: 04/24/20  4:47 PM  Result Value Ref Range Status   Enterococcus faecalis NOT DETECTED NOT DETECTED Final   Enterococcus Faecium NOT DETECTED NOT DETECTED Final   Listeria monocytogenes NOT DETECTED NOT DETECTED Final   Staphylococcus species DETECTED (A) NOT DETECTED Final    Comment: CRITICAL RESULT CALLED TO, READ BACK BY AND VERIFIED WITH: D. WOFFORD PHARMD, AT 1758 04/25/20 BY D. VANHOOK    Staphylococcus aureus (BCID) NOT DETECTED NOT DETECTED Final   Staphylococcus epidermidis NOT DETECTED NOT DETECTED Final   Staphylococcus lugdunensis NOT DETECTED NOT DETECTED Final   Streptococcus species NOT DETECTED NOT DETECTED Final   Streptococcus agalactiae NOT DETECTED NOT DETECTED Final   Streptococcus pneumoniae NOT DETECTED NOT DETECTED Final   Streptococcus pyogenes NOT DETECTED NOT DETECTED Final   A.calcoaceticus-baumannii NOT DETECTED NOT DETECTED Final   Bacteroides fragilis NOT DETECTED NOT DETECTED Final   Enterobacterales NOT DETECTED NOT DETECTED Final   Enterobacter cloacae complex NOT DETECTED NOT DETECTED Final   Escherichia coli NOT DETECTED NOT DETECTED Final   Klebsiella aerogenes NOT  DETECTED NOT DETECTED Final   Klebsiella oxytoca NOT DETECTED NOT DETECTED Final   Klebsiella pneumoniae NOT DETECTED NOT DETECTED Final   Proteus species NOT DETECTED NOT DETECTED  Final   Salmonella species NOT DETECTED NOT DETECTED Final   Serratia marcescens NOT DETECTED NOT DETECTED Final   Haemophilus influenzae NOT DETECTED NOT DETECTED Final   Neisseria meningitidis NOT DETECTED NOT DETECTED Final   Pseudomonas aeruginosa NOT DETECTED NOT DETECTED Final   Stenotrophomonas maltophilia NOT DETECTED NOT DETECTED Final   Candida albicans NOT DETECTED NOT DETECTED Final   Candida auris NOT DETECTED NOT DETECTED Final   Candida glabrata NOT DETECTED NOT DETECTED Final   Candida krusei NOT DETECTED NOT DETECTED Final   Candida parapsilosis NOT DETECTED NOT DETECTED Final   Candida tropicalis NOT DETECTED NOT DETECTED Final   Cryptococcus neoformans/gattii NOT DETECTED NOT DETECTED Final    Comment: Performed at [facility], [address] [place], [place] [address]  Blood culture (routine x 2)     Status: None   Collection Time: 04/24/20  5:04 PM   Specimen: BLOOD  Result Value Ref Range Status   Specimen Description   Final    BLOOD BLOOD LEFT FOREARM Performed at [facility] [address] [place], [place] [address]    Special Requests   Final    BOTTLES DRAWN AEROBIC AND ANAEROBIC Blood Culture adequate volume Performed at [place] [address], [place], [place] [address]    Culture   Final    NO GROWTH 5 DAYS Performed at [facility], [place] [address], [place], [place] [address]    Report Status 04/29/2020 FINAL  Final  SARS Coronavirus 2 by RT PCR (hospital order, performed in [facility] hospital lab) Nasopharyngeal Nasopharyngeal Swab     Status: None   Collection Time: 05/02/20  9:49 AM   Specimen: Nasopharyngeal Swab  Result Value Ref Range Status   SARS Coronavirus 2 NEGATIVE NEGATIVE Final    Comment:  (NOTE) SARS-CoV-2 target nucleic acids are NOT DETECTED.  The SARS-CoV-2 RNA is generally detectable in upper and lower respiratory specimens during the acute phase of infection. The lowest concentration of SARS-CoV-2 viral copies this assay can detect is 250 copies / mL. A negative result does not preclude SARS-CoV-2 infection and should not be used as the sole basis for treatment or other patient management decisions.  A negative result may occur with improper specimen collection / handling, submission of specimen other than nasopharyngeal swab, presence of viral mutation(s) within the areas targeted by this assay, and inadequate number of viral copies (<250 copies / mL). A negative result must be combined with clinical observations, patient history, and epidemiological information.  Fact Sheet for Patients:   StrictlyIdeas.no  Fact Sheet for Healthcare Providers: BankingDealers.co.za  This test is not yet approved or  cleared by the [place] FDA and has been authorized for detection and/or diagnosis of SARS-CoV-2 by FDA under an Emergency Use Authorization (EUA).  This EUA will remain in effect (meaning this test can be used) for the duration of the COVID-19 declaration under Section 564(b)(1) of the Act, 21 U.S.C. section 360bbb-3(b)(1), unless the authorization is terminated or revoked sooner.  Performed at [facility], [place] [address] [place], [place] [address]       Radiology Studies: No results found.  Scheduled Meds: . atorvastatin  20 mg Oral Daily  . carvedilol  6.25 mg Oral BID WC  . donepezil  10 mg Oral Daily  . enoxaparin (LOVENOX) injection  60 mg Subcutaneous Q24H  . FLUoxetine  10 mg Oral Daily  . furosemide  80 mg  Intravenous TID AC  . lacosamide  200 mg Oral BID  . metoCLOPramide  5 mg Oral TID AC   Continuous Infusions:   LOS: 9 days   Time spent: 28 minutes.  Darliss Cheney,  MD [facility] [email or web address] 05/03/2020, 11:44 AM

## 2020-05-04 LAB — BASIC METABOLIC PANEL
Anion gap: 6 (ref 5–15)
BUN: 31 mg/dL — ABNORMAL HIGH (ref 6–20)
CO2: 31 mmol/L (ref 22–32)
Calcium: 7.7 mg/dL — ABNORMAL LOW (ref 8.9–10.3)
Chloride: 94 mmol/L — ABNORMAL LOW (ref 98–111)
Creatinine, Ser: 1.25 mg/dL — ABNORMAL HIGH (ref 0.44–1.00)
GFR calc Af Amer: 55 mL/min — ABNORMAL LOW (ref >60)
GFR calc non Af Amer: 48 mL/min — ABNORMAL LOW (ref >60)
Glucose, Bld: 94 mg/dL (ref 70–99)
Potassium: 4.6 mmol/L (ref 3.5–5.1)
Sodium: 131 mmol/L — ABNORMAL LOW (ref 135–145)

## 2020-05-04 LAB — GLUCOSE, CAPILLARY
Glucose-Capillary: 99 mg/dL (ref 70–99)
Glucose-Capillary: 146 mg/dL — ABNORMAL HIGH (ref 70–99)
Glucose-Capillary: 113 mg/dL — ABNORMAL HIGH (ref 70–99)
Glucose-Capillary: 138 mg/dL — ABNORMAL HIGH (ref 70–99)

## 2020-05-04 LAB — MAGNESIUM: Magnesium: 1.9 mg/dL (ref 1.7–2.4)

## 2020-05-04 NOTE — Progress Notes (Signed)
Physical Therapy Treatment Patient Details Name: Marissa Wilkerson MRN: [id number] DOB: 1960-12-20 Today's Date: 05/04/2020    History of Present Illness 59 y.o. female with medical history significant of diabetes, chronic diastolic heart failure, sarcoidosis -oxygen dependent, CHF, DM, BPPV, obesity and admitted for Acute on chronic diastolic congestive heart failure with anasarca    PT Comments    Pt looks better.  "I do?" stated pt.  Pt visibly less swelling throughout.  Assisted OOB to recliner.  General bed mobility comments: pt required Total Assist + 2 to return to bed.  Pt 15% able limited by body habitus, edema and weakness.  General transfer comment: Assisted OOB to recliner stand pivot sit "Bear Hug" with pt increased ability to lean forward and increased ability to stand pivot long enough to complete 1/4 turn.  Did c/o MAX dizziness after though.  BP soft at 105/59 once in recliner.  Required 5 min rest period for symptoms to subside. Positioned in recliner semi reclined as pt is unable to tolerate upright.  "my tummy is too big". Pt requested I return in 40 min to assist back to bed.  "I don't think I can last longer than that".     Follow Up Recommendations  SNF     Equipment Recommendations       Recommendations for Other Services       Precautions / Restrictions Precautions Precautions: Fall Precaution Comments: 3.5 L O2 [place] baseline    Mobility  Bed Mobility Overal bed mobility: Needs Assistance Bed Mobility: Supine to Sit       Sit to supine: Total assist;+2 for physical assistance;+2 for safety/equipment   General bed mobility comments: pt required Total Assist + 2 to return to bed.  Pt 15% able limited by body habitus, edema and weakness.  Transfers Overall transfer level: Needs assistance   Transfers: Stand Pivot Transfers           General transfer comment: Assisted OOB to recliner stand pivot sit "Bear Hug" with pt increased ability to lean forward and  increased ability to stand pivot long enough to complete 1/4 turn.  Did c/o MAX dizziness after though.  BP soft at 105/59 once in recliner.  Required 5 min rest period for symptoms to subside.  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  Name is pronounced like Ty-na      Exercises      General Comments        Pertinent  Vitals/Pain Pain Assessment: No/denies pain    Home Living                      Prior Function            PT Goals (current goals can now be found in the care plan section)      Frequency    Min 2X/week      PT Plan Current plan remains appropriate    Co-evaluation              AM-PAC PT "6 Clicks" Mobility   Outcome Measure  Help needed turning from your back to your side while in a flat bed without using bedrails?: Total Help needed moving from lying on your back to sitting on the side of a flat bed without using bedrails?: Total Help needed moving to and from a bed to a chair (including a wheelchair)?: Total Help needed standing up from a chair using your arms (e.g., wheelchair or bedside chair)?: Total Help needed to walk in hospital room?: Total Help needed climbing 3-5 steps with a railing? : Total 6 Click Score: 6    End of Session Equipment Utilized During Treatment: Oxygen;Gait belt Activity Tolerance: Patient limited by  fatigue Patient left: in chair;with call bell/phone within reach;with family/visitor present Nurse Communication: Mobility status PT Visit Diagnosis: Muscle weakness (generalized) (M62.81);Other abnormalities of gait and mobility (R26.89)     Time: 7517-0017 PT Time Calculation (min) (ACUTE ONLY): 30 min  Charges:  $Therapeutic Activity: 23-37 mins                     Rica Koyanagi  PTA Acute  Rehabilitation Services Pager      [phone] Office      [phone]

## 2020-05-04 NOTE — Progress Notes (Signed)
OT Cancellation Note  Patient Details Name: Marissa Wilkerson MRN: [id number] DOB: [birth year]   Cancelled Treatment:    Reason Eval/Treat Not Completed: Other (comment)  Pt just back to bed with lift with PT. Will check on pt later this day or next day Kari Baars, [place] [phone] Office- [phone], Thereasa Parkin 05/04/2020, 1:56 PM

## 2020-05-04 NOTE — Progress Notes (Signed)
Physical Therapy Treatment Patient Details Name: Marissa Wilkerson MRN: [id number] DOB: [birth year] Today's Date: 05/04/2020    History of Present Illness 59 y.o. female with medical history significant of diabetes, chronic diastolic heart failure, sarcoidosis -oxygen dependent, CHF, DM, BPPV, obesity and admitted for Acute on chronic diastolic congestive heart failure with anasarca    PT Comments    Pt was only able to tolerate semi reclined in recliner 40 min.  Assisted back to bed via [place].    Follow Up Recommendations  SNF     Equipment Recommendations       Recommendations for Other Services       Precautions / Restrictions Precautions Precautions: Fall Precaution Comments: 3.5 L O2 [place] baseline    Mobility  Bed Mobility Overal bed mobility: Needs Assistance Bed Mobility: Supine to Sit     Supine to sit: Total assist;+2 for physical assistance;+2 for safety/equipment Sit to supine: Total assist;+2 for physical assistance;+2 for safety/equipment   General bed mobility comments: used Maxi Move to assist back to bed.  + 2 total assist to position and scoot to [place]  Transfers Overall transfer level: Needs assistance   Transfers: Stand Pivot Transfers           General transfer comment: used MAXI MOVE to assist out of recliner back to bed  Ambulation/Gait             General Gait Details: pt has been non amb for > 6 months   Stairs             Wheelchair Mobility    Modified Rankin (Stroke Patients Only)       Balance                                            Cognition Arousal/Alertness: Awake/alert Behavior During Therapy: WFL for tasks assessed/performed Overall Cognitive Status: Within Functional Limits for tasks assessed                                 General Comments: AxO x 3 very pleasant and willing but also very fearful of falling or being "dropped"  Pt is a retired Therapist, 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  Name is pronounced like Ty-na      Exercises      General Comments        Pertinent Vitals/Pain Pain Assessment: No/denies pain    Home Living                      Prior Function            PT Goals (current goals can now be found in the care plan section)      Frequency    Min 2X/week      PT Plan Current plan remains appropriate    Co-evaluation              AM-PAC PT "6 Clicks" Mobility   Outcome Measure  Help needed turning from your back to your side while in a flat bed without using bedrails?: Total Help needed moving from lying on your back to sitting on the side of a flat bed without using bedrails?: Total Help needed moving to and from a bed to a chair (including a  wheelchair)?: Total Help needed standing up from a chair using your arms (e.g., wheelchair or bedside chair)?: Total Help needed to walk in hospital room?: Total Help needed climbing 3-5 steps with a railing? : Total 6 Click Score: 6    End of Session Equipment Utilized During Treatment: Oxygen;Gait belt Activity Tolerance: Patient limited by fatigue Patient left: in bed Nurse Communication: Mobility status PT Visit Diagnosis: Muscle weakness (generalized) (M62.81);Other abnormalities of gait and mobility (R26.89)     Time: 7564-3329 PT Time Calculation (min) (ACUTE ONLY): 25 min  Charges:  $Therapeutic Activity: 23-37 mins                     Rica Koyanagi  PTA Acute  Rehabilitation Services Pager      [phone] Office      [phone]

## 2020-05-04 NOTE — Progress Notes (Signed)
PROGRESS NOTE  Marissa Wilkerson  HQI:[id number] DOB: [birth year] DOA: 04/24/2020 PCP: Jani Gravel, MD  Outpatient Specialists: Cardiology, Dr. Gwenlyn Found Brief Narrative: Marissa Wilkerson is a 59 y.o. 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 CXR revealed diffuse bilateral interstitial opacities consistent with pulmonary edema and left pleural effusion with cardiomegaly. Imaging also suggestive of new diagnosis hepatic cirrhosis.  She was admitted to hospitalist service.  Echo 8/29 with LVEF 60-65%, indeterminate diastolic parameters, no WMA's, normal RV, mild-moderate AS mean gradient 16mmHg (unchanged from report of echo Jan 2019).  She was initially started on medium dose of IV Lasix.  Her renal function remained stable.  There was not enough diuresis so dose was increased to 80 mg IV twice daily and Aldactone was added.  Despite of this heavy diuresis doses, patient has not had significant diuresis and has not had enough improvement in her bilateral lower extremity edema.  She was also diagnosed with UTI for which she completed treatment with ceftriaxone.  Assessment & Plan: Principal Problem:   CHF exacerbation ([place]) Active Problems:   Hypertension   Diabetes mellitus without complication ([place])   Diastolic dysfunction   Sarcoidosis of lung (HCC)   Hyperlipidemia   CHF (congestive heart failure), NYHA class I, unspecified failure chronicity, diastolic (HCC)   History of seizure   Pressure injury of skin  Acute on chronic HFpEF with anasarca: Echo 8/29 with LVEF 60-65%, indeterminate diastolic parameters, no WMA's, normal RV, mild-moderate AS mean gradient 64mmHg (unchanged from report of echo Jan 2019). Exacerbated by hypoalbuminemia likely related to cirrhosis. BNP 264.5.  If I's and O's  are accurate then she has had only about 1200 cc of diuresis in last 24 hours with net 8.5 L negative so far since admission.  Patient still has significant bilateral lower extremity edema up to +3 to +4.  This is affecting patient's mobility as she cannot lift her legs.  Due to this reason, patient will not be able to participate in any sort of rehabilitation and physical therapy has recommended SNF.  I think patient needs at least a few more days if not more of IV diuresis as she is getting here and wants her edema improved to the level that she is able to participate in physical therapy, she can be discharged.  I have communicated with the TOC and we are going to cancel her discharge.  Continue current dose of IV Lasix 80 mg 3 times daily as well as Aldactone oral.  Chronic hypoxemic respiratory failure due to pulmonary sarcoidosis with acute pulmonary edema: Baseline oxygen usage is 4 L via nasal cannula, currently on 3.5 L.  Off MTX since 2018. - Follow up with pulmonology, Dr. Lamonte Sakai, as outpatient.  Hepatic cirrhosis: ?If related to sarcoid.  - INR 1.2, albumin 1.5, TBili 0.3.  - Check serologies, pending.  HCV Ab neg, HIV NR.  - Continue lasix, spironolactone as above - Na restriction - Low threshold for lactulose. Ammonia initially elevated to 38, down to 25. - Consider paracentesis if develops abdominal discomfort.  Hypoalbuminemia: Impaired synthetic function and proteinuria contributing. - Quantify proteinuria. Noted to have proteinuria dating to 2019.   Hyperkalemia: Resolved.  Hypokalemia: Resolved  Hypomagnesemia: Resolved  CKd stage IIIa: At baseline.  Monitor.  Seizure disorder:  - Continue vimpat, continue [facility]  follow up as outpatient  IDT2DM: HbA1c 4.6%, possibly developing impairment in gluconeogenesis.  SSI discontinued.  HTN crisis: POA, resolved.  Blood pressure stable and improved. - Continue antihypertensive regimen and volume removal.   Dementia,  psychosis?:  - Continue home medications aricept and prozac.  She is alert and oriented.  Staphylococcus capitis in 1 blood culture: Suspected contaminant.  - Monitor clinically without targeted Tx.  E. coli UTI:  - Completed Tx w/ceftriaxone  Stage I decubitus sacral ulcer: Unsure whether this was present at the time of admission not.  This was pointed out to me by her husband today.  Dressing per nurses.  Obesity: Estimated body mass index is 43.7 kg/m as calculated from the following:   Height as of this encounter: 5\' 3"  (1.6 m).   Weight as of this encounter: 111.9 kg.   Breast mass: Exam benign per previous hospitalist.  - OP mammogram per routine.   DVT prophylaxis: Lovenox Code Status: Full Family Communication: Husband present at bedside.  Long discussion with both of them about further need of IV diuresis and the need to remain in the hospital for that.  Plan of care discussed with both of them and they are both in agreement.  Status is: Inpatient  Remains inpatient appropriate because:IV treatments appropriate due to intensity of illness or inability to take PO. Diffuse volume overload refractory to IV diuresis.   Dispo: The patient is from: Home              Anticipated d/c is to: SNF              Anticipated d/c date is: 2 to 3 days              Patient currently is not medically stable to d/c.  Consultants:   None  Procedures:   None  Antimicrobials:  Ceftriaxone 8/28 - 8/30   Subjective: Patient seen and examined.  She has no new complaint but does not feel that she had enough urine output in last 24 hours.  Husband at the bedside.  Objective: Vitals:   05/03/20 1629 05/03/20 2055 05/04/20 0520 05/04/20 0953  BP: (!) 134/52 (!) 138/59 139/62 (!) 123/48  Pulse: 65 63 65 67  Resp:  15 18 14   Temp: 98.2 F (36.8 C) 98.3 F (36.8 C) 98 F (36.7 C) 98.3 F (36.8 C)  TempSrc: Oral Oral Oral Oral  SpO2: 100% 99% 97% 100%  Weight:      Height:         Intake/Output Summary (Last 24 hours) at 05/04/2020 1140 Last data filed at 05/04/2020 1013 Gross per 24 hour  Intake 1320 ml  Output 2550 ml  Net -1230 ml   Filed Weights   04/29/20 0500 05/01/20 0435 05/03/20 0506  Weight: 114.4 kg 113.1 kg 111.9 kg   General exam: Appears calm and comfortable, obese Respiratory system: Clear to auscultation. Respiratory effort normal. Cardiovascular system: S1 & S2 heard, RRR. No JVD, murmurs, rubs, gallops or clicks.  +3-4 pitting edema bilateral lower extremity Gastrointestinal system: Abdomen is nondistended, soft and nontender. No organomegaly or masses felt. Normal bowel sounds heard. Central nervous system: Alert and oriented. No focal neurological deficits. Extremities: Symmetric 5 x 5 power. Skin: No rashes, lesions or ulcers.  Psychiatry: Judgement and insight appear normal. Mood & affect appropriate.   Data Reviewed: I have personally reviewed following labs and imaging studies  CBC: Recent Labs  Lab 04/28/20 0721 04/29/20 0830 05/01/20 0519 05/02/20  0422  WBC 6.8 7.2 8.1 8.6  NEUTROABS 5.3 5.6 6.1 6.4  HGB 8.3* 8.0* 7.7* 8.1*  HCT 26.6* 25.2* 24.7* 25.5*  MCV 96.4 95.8 96.5 96.6  PLT 163 150 160 782*   Basic Metabolic Panel: Recent Labs  Lab 04/28/20 0721 04/28/20 0721 04/29/20 0830 05/01/20 0519 05/02/20 0422 05/03/20 0438 05/04/20 0458  NA 136   < > 135 135 137 131* 131*  K 3.2*   < > 3.7 4.7 5.5* 4.8 4.6  CL 98   < > 98 97* 98 93* 94*  CO2 28   < > 30 32 32 30 31  GLUCOSE 98   < > 96 100* 93 93 94  BUN 21*   < > 22* 28* 30* 31* 31*  CREATININE 1.15*   < > 1.16* 1.30* 1.32* 1.29* 1.25*  CALCIUM 7.6*   < > 7.6* 7.8* 8.1* 8.0* 7.7*  MG 1.7  --   --  1.6* 2.1  --  1.9   < > = values in this interval not displayed.   GFR: Estimated Creatinine Clearance: 59.7 mL/min (A) (by C-G formula based on SCr of 1.25 mg/dL (H)). Liver Function Tests: Recent Labs  Lab 04/28/20 0721 04/29/20 0830 05/01/20 0519  05/02/20 0422  AST 18 17 14* 13*  ALT 11 11 9 11   ALKPHOS 55 56 53 59  BILITOT 0.5 0.3 0.3 0.6  PROT 5.5* 5.3* 5.5* 5.6*  ALBUMIN 1.5* 1.5* 1.7* 1.8*   No results for input(s): LIPASE, AMYLASE in the last 168 hours. No results for input(s): AMMONIA in the last 168 hours. Coagulation Profile: No results for input(s): INR, PROTIME in the last 168 hours. Cardiac Enzymes: No results for input(s): CKTOTAL, CKMB, CKMBINDEX, TROPONINI in the last 168 hours. BNP (last 3 results) No results for input(s): PROBNP in the last 8760 hours. HbA1C: No results for input(s): HGBA1C in the last 72 hours. CBG: Recent Labs  Lab 05/03/20 0726 05/03/20 1117 05/03/20 1626 05/03/20 2053 05/04/20 0728  GLUCAP 83 98 131* 127* 99   Lipid Profile: No results for input(s): CHOL, HDL, LDLCALC, TRIG, CHOLHDL, LDLDIRECT in the last 72 hours. Thyroid Function Tests: No results for input(s): TSH, T4TOTAL, FREET4, T3FREE, THYROIDAB in the last 72 hours. Anemia Panel: No results for input(s): VITAMINB12, FOLATE, FERRITIN, TIBC, IRON, RETICCTPCT in the last 72 hours. Urine analysis:    Component Value Date/Time   COLORURINE AMBER (A) 04/24/2020 1407   APPEARANCEUR CLOUDY (A) 04/24/2020 1407   LABSPEC 1.017 04/24/2020 1407   PHURINE 5.0 04/24/2020 1407   GLUCOSEU NEGATIVE 04/24/2020 1407   HGBUR MODERATE (A) 04/24/2020 1407   BILIRUBINUR NEGATIVE 04/24/2020 1407   KETONESUR NEGATIVE 04/24/2020 1407   PROTEINUR >=300 (A) 04/24/2020 1407   UROBILINOGEN 1.0 03/03/2014 0942   NITRITE NEGATIVE 04/24/2020 1407   LEUKOCYTESUR MODERATE (A) 04/24/2020 1407   Recent Results (from the past 240 hour(s))  Urine culture     Status: Abnormal   Collection Time: 04/24/20  2:07 PM   Specimen: Urine, Random  Result Value Ref Range Status   Specimen Description   Final    URINE, RANDOM Performed at [facility], [place] [address], [place], [place] [address]    Special Requests   Final     NONE Performed at [facility], [place] [address] [place], [place] [address]    Culture >=100,000 COLONIES/mL ESCHERICHIA COLI (A)  Final   Report Status 04/26/2020 FINAL  Final   Organism ID,  Bacteria ESCHERICHIA COLI (A)  Final      Susceptibility   Escherichia coli - MIC*    AMPICILLIN >=32 RESISTANT Resistant     CEFAZOLIN <=4 SENSITIVE Sensitive     CEFTRIAXONE <=0.25 SENSITIVE Sensitive     CIPROFLOXACIN >=4 RESISTANT Resistant     GENTAMICIN <=1 SENSITIVE Sensitive     IMIPENEM <=0.25 SENSITIVE Sensitive     NITROFURANTOIN <=16 SENSITIVE Sensitive     TRIMETH/SULFA >=320 RESISTANT Resistant     AMPICILLIN/SULBACTAM >=32 RESISTANT Resistant     PIP/TAZO 8 SENSITIVE Sensitive     * >=100,000 COLONIES/mL ESCHERICHIA COLI  SARS Coronavirus 2 by RT PCR (hospital order, performed in [place] hospital lab) Nasopharyngeal Nasopharyngeal Swab     Status: None   Collection Time: 04/24/20  3:33 PM   Specimen: Nasopharyngeal Swab  Result Value Ref Range Status   SARS Coronavirus 2 NEGATIVE NEGATIVE Final    Comment: (NOTE) SARS-CoV-2 target nucleic acids are NOT DETECTED.  The SARS-CoV-2 RNA is generally detectable in upper and lower respiratory specimens during the acute phase of infection. The lowest concentration of SARS-CoV-2 viral copies this assay can detect is 250 copies / mL. A negative result does not preclude SARS-CoV-2 infection and should not be used as the sole basis for treatment or other patient management decisions.  A negative result may occur with improper specimen collection / handling, submission of specimen other than nasopharyngeal swab, presence of viral mutation(s) within the areas targeted by this assay, and inadequate number of viral copies (<250 copies / mL). A negative result must be combined with clinical observations, patient history, and epidemiological information.  Fact Sheet for Patients:    StrictlyIdeas.no  Fact Sheet for Healthcare Providers: BankingDealers.co.za  This test is not yet approved or  cleared by the [place] FDA and has been authorized for detection and/or diagnosis of SARS-CoV-2 by FDA under an Emergency Use Authorization (EUA).  This EUA will remain in effect (meaning this test can be used) for the duration of the COVID-19 declaration under Section 564(b)(1) of the Act, 21 U.S.C. section 360bbb-3(b)(1), unless the authorization is terminated or revoked sooner.  Performed at [facility], [place] [address] [place], [place] [address]   Blood culture (routine x 2)     Status: Abnormal   Collection Time: 04/24/20  4:47 PM   Specimen: BLOOD  Result Value Ref Range Status   Specimen Description   Final    BLOOD RIGHT ANTECUBITAL Performed at [place] [address], [place], [place] [address]    Special Requests   Final    BOTTLES DRAWN AEROBIC AND ANAEROBIC Blood Culture adequate volume Performed at [place] [address] [place], [place] [address]    Culture  Setup Time   Final    GRAM POSITIVE COCCI IN CLUSTERS AEROBIC BOTTLE ONLY Organism ID to follow CRITICAL RESULT CALLED TO, READ BACK BY AND VERIFIED WITH: D. WOFFORD PHARMD, AT 9233 04/25/20 BY D. VANHOOK    Culture (A)  Final    STAPHYLOCOCCUS CAPITIS THE SIGNIFICANCE OF ISOLATING THIS ORGANISM FROM A SINGLE SET OF BLOOD CULTURES WHEN MULTIPLE SETS ARE DRAWN IS UNCERTAIN. PLEASE NOTIFY THE MICROBIOLOGY DEPARTMENT WITHIN ONE WEEK IF SPECIATION AND SENSITIVITIES ARE REQUIRED. Performed at [facility], [place] [address] [place], [place] [address]    Report Status 04/27/2020 FINAL  Final  Blood Culture ID Panel (Reflexed)     Status: Abnormal   Collection  Time: 04/24/20  4:47 PM  Result Value Ref Range Status   Enterococcus faecalis NOT DETECTED NOT DETECTED Final    Enterococcus Faecium NOT DETECTED NOT DETECTED Final   Listeria monocytogenes NOT DETECTED NOT DETECTED Final   Staphylococcus species DETECTED (A) NOT DETECTED Final    Comment: CRITICAL RESULT CALLED TO, READ BACK BY AND VERIFIED WITH: D. WOFFORD PHARMD, AT 1758 04/25/20 BY D. VANHOOK    Staphylococcus aureus (BCID) NOT DETECTED NOT DETECTED Final   Staphylococcus epidermidis NOT DETECTED NOT DETECTED Final   Staphylococcus lugdunensis NOT DETECTED NOT DETECTED Final   Streptococcus species NOT DETECTED NOT DETECTED Final   Streptococcus agalactiae NOT DETECTED NOT DETECTED Final   Streptococcus pneumoniae NOT DETECTED NOT DETECTED Final   Streptococcus pyogenes NOT DETECTED NOT DETECTED Final   A.calcoaceticus-baumannii NOT DETECTED NOT DETECTED Final   Bacteroides fragilis NOT DETECTED NOT DETECTED Final   Enterobacterales NOT DETECTED NOT DETECTED Final   Enterobacter cloacae complex NOT DETECTED NOT DETECTED Final   Escherichia coli NOT DETECTED NOT DETECTED Final   Klebsiella aerogenes NOT DETECTED NOT DETECTED Final   Klebsiella oxytoca NOT DETECTED NOT DETECTED Final   Klebsiella pneumoniae NOT DETECTED NOT DETECTED Final   Proteus species NOT DETECTED NOT DETECTED Final   Salmonella species NOT DETECTED NOT DETECTED Final   Serratia marcescens NOT DETECTED NOT DETECTED Final   Haemophilus influenzae NOT DETECTED NOT DETECTED Final   Neisseria meningitidis NOT DETECTED NOT DETECTED Final   Pseudomonas aeruginosa NOT DETECTED NOT DETECTED Final   Stenotrophomonas maltophilia NOT DETECTED NOT DETECTED Final   Candida albicans NOT DETECTED NOT DETECTED Final   Candida auris NOT DETECTED NOT DETECTED Final   Candida glabrata NOT DETECTED NOT DETECTED Final   Candida krusei NOT DETECTED NOT DETECTED Final   Candida parapsilosis NOT DETECTED NOT DETECTED Final   Candida tropicalis NOT DETECTED NOT DETECTED Final   Cryptococcus neoformans/gattii NOT DETECTED NOT DETECTED Final     Comment: Performed at [facility], [address], [place], [place] [address]  Blood culture (routine x 2)     Status: None   Collection Time: 04/24/20  5:04 PM   Specimen: BLOOD  Result Value Ref Range Status   Specimen Description   Final    BLOOD BLOOD LEFT FOREARM Performed at [facility] [address], [place], [place] [address]    Special Requests   Final    BOTTLES DRAWN AEROBIC AND ANAEROBIC Blood Culture adequate volume Performed at [place] [address], [place], [place] [address]    Culture   Final    NO GROWTH 5 DAYS Performed at [facility], [place] [address] [place], [place] [address]    Report Status 04/29/2020 FINAL  Final  SARS Coronavirus 2 by RT PCR (hospital order, performed in [facility] hospital lab) Nasopharyngeal Nasopharyngeal Swab     Status: None   Collection Time: 05/02/20  9:49 AM   Specimen: Nasopharyngeal Swab  Result Value Ref Range Status   SARS Coronavirus 2 NEGATIVE NEGATIVE Final    Comment: (NOTE) SARS-CoV-2 target nucleic acids are NOT DETECTED.  The SARS-CoV-2 RNA is generally detectable in upper and lower respiratory specimens during the acute phase of infection. The lowest concentration of SARS-CoV-2 viral copies this assay can detect is 250 copies / mL. A negative result does not preclude SARS-CoV-2 infection and should not be used as the sole basis for treatment or other patient management decisions.  A negative result may occur with improper specimen collection / handling, submission of specimen other than nasopharyngeal swab, presence of viral mutation(s) within the areas targeted by this assay, and inadequate number of viral copies (<250 copies / mL). A negative result must be combined with clinical observations, patient history, and epidemiological information.  Fact Sheet for Patients:   StrictlyIdeas.no  Fact Sheet for Healthcare  Providers: BankingDealers.co.za  This test is not yet approved or  cleared by the [place] FDA and has been authorized for detection and/or diagnosis of SARS-CoV-2 by FDA under an Emergency Use Authorization (EUA).  This EUA will remain in effect (meaning this test can be used) for the duration of the COVID-19 declaration under Section 564(b)(1) of the Act, 21 U.S.C. section 360bbb-3(b)(1), unless the authorization is terminated or revoked sooner.  Performed at [facility], [place] [address] [place], [place] [address]       Radiology Studies: No results found.  Scheduled Meds: . atorvastatin  20 mg Oral Daily  . carvedilol  6.25 mg Oral BID WC  . donepezil  10 mg Oral Daily  . enoxaparin (LOVENOX) injection  60 mg Subcutaneous Q24H  . FLUoxetine  10 mg Oral Daily  . furosemide  80 mg Intravenous TID AC  . lacosamide  200 mg Oral BID  . metoCLOPramide  5 mg Oral TID AC   Continuous Infusions:   LOS: 10 days   Time spent: 30 minutes.  Darliss Cheney, MD [facility] [email or web address] 05/04/2020, 11:40 AM

## 2020-05-04 NOTE — TOC Progression Note (Signed)
Transition of Care [facility]) - Progression Note    Patient Details  Name: Marissa Wilkerson MRN: [id number] Date of Birth: February 24, 1961  [facility] CM/SW Contact  Ross Ludwig, [place] Phone Number: 05/04/2020, 11:22 AM  Clinical Narrative:     CSW updated SNF that patient is still not medically ready for discharge due to aggressive IV diuresis.  SNF will continue to hold bed for patient, CSW to continue to follow patient's progress throughout discharge planning.   Expected Discharge Plan: Skilled Nursing Facility Barriers to Discharge: Ship broker, Continued Medical Work up  Expected Discharge Plan and Services Expected Discharge Plan: [place] Choice: [place] arrangements for the past 2 months: Single Family Home                   DME Agency: NA       HH Arranged: NA           Social Determinants of Health (SDOH) Interventions    Readmission Risk Interventions Readmission Risk Prevention Plan 04/29/2020  Transportation Screening Complete  PCP or Specialist Appt within 3-5 Days Complete  HRI or [place] Complete  Social Work Consult for [place] Planning/Counseling Complete  Palliative Care Screening Complete  Medication Review Press photographer) Referral to Pharmacy  Some recent data might be hidden

## 2020-05-05 ENCOUNTER — Inpatient Hospital Stay (HOSPITAL_COMMUNITY): Payer: Medicare Other

## 2020-05-05 LAB — BASIC METABOLIC PANEL
Anion gap: 8 (ref 5–15)
BUN: 34 mg/dL — ABNORMAL HIGH (ref 6–20)
CO2: 32 mmol/L (ref 22–32)
Calcium: 8 mg/dL — ABNORMAL LOW (ref 8.9–10.3)
Chloride: 94 mmol/L — ABNORMAL LOW (ref 98–111)
Creatinine, Ser: 1.45 mg/dL — ABNORMAL HIGH (ref 0.44–1.00)
GFR calc Af Amer: 46 mL/min — ABNORMAL LOW (ref >60)
GFR calc non Af Amer: 40 mL/min — ABNORMAL LOW (ref >60)
Glucose, Bld: 120 mg/dL — ABNORMAL HIGH (ref 70–99)
Potassium: 4.5 mmol/L (ref 3.5–5.1)
Sodium: 134 mmol/L — ABNORMAL LOW (ref 135–145)

## 2020-05-05 LAB — GLUCOSE, CAPILLARY
Glucose-Capillary: 94 mg/dL (ref 70–99)
Glucose-Capillary: 108 mg/dL — ABNORMAL HIGH (ref 70–99)
Glucose-Capillary: 115 mg/dL — ABNORMAL HIGH (ref 70–99)
Glucose-Capillary: 153 mg/dL — ABNORMAL HIGH (ref 70–99)

## 2020-05-05 MED ORDER — SPIRONOLACTONE 25 MG PO TABS
100.0000 mg | ORAL_TABLET | Freq: Every day | ORAL | Status: DC
  Administered 2020-05-05 – 2020-05-07 (×3): 100 mg via ORAL
  Filled 2020-05-05 (×3): qty 4

## 2020-05-05 NOTE — Progress Notes (Signed)
Occupational Therapy Treatment Patient Details Name: Marissa Wilkerson MRN: [id number] DOB: 1961-02-11 Today's Date: 05/05/2020    History of present illness 59 y.o. female with medical history significant of diabetes, chronic diastolic heart failure, sarcoidosis -oxygen dependent, CHF, DM, BPPV, obesity and admitted for Acute on chronic diastolic congestive heart failure with anasarca   OT comments  Patient continues to need significant assistance for bed mobility and standing. Today patient demonstrated improved sitting balance and sitting activity tolerance. Patient required use of stedy and +2 physical assistance to stand x 1 and perform second partial stand. Continues to be limited by fear of falling, weakness, and edema. Continue to recommend SNF at discharge.   Follow Up Recommendations  SNF    Equipment Recommendations  None recommended by OT    Recommendations for Other Services      Precautions / Restrictions Precautions Precautions: Fall Precaution Comments: 3.5 L O2 [place] baseline Restrictions Weight Bearing Restrictions: No       Mobility Bed Mobility Overal bed mobility: Needs Assistance Bed Mobility: Rolling;Sidelying to Sit Rolling: Max assist Sidelying to sit: Max assist Supine to sit: Total assist;+2 for physical assistance;+2 for safety/equipment     General bed mobility comments: Max assist to roll to the right .  Max assist to transfer into sitting needing assistance for both LEs and trunk lift off. Therapist provided verbal cues and instruction for patient to assist with transfer - patient able to hold onto bed rail and push down through right elbow to assist with coming into sitting. Total assist for return to bed.  Transfers Overall transfer level: Needs assistance       Stand pivot transfers: Total assist;+2 physical assistance;+2 safety/equipment;From elevated surface       General transfer comment: Used stedy to work on sit to stand. Patient able to  come into one stand (with +2 physical assistance) though unable to keep her upper body erect due to UE weakness. Then a second partial stand - with patient unable to get knees fully extended.    Balance Overall balance assessment: Needs assistance Sitting-balance support: No upper extremity supported;Feet supported Sitting balance-Leahy Scale: Fair     Standing balance support: During functional activity;Bilateral upper extremity supported Standing balance-Leahy Scale: Poor                             ADL either performed or assessed with clinical judgement   ADL                                               Vision Patient Visual Report: No change from baseline     Perception     Praxis      Cognition Arousal/Alertness: Awake/alert Behavior During Therapy: WFL for tasks assessed/performed Overall Cognitive Status: Within Functional Limits for tasks assessed                                 General Comments: AxO x 3 very pleasant and willing but also very fearful of falling or being "dropped"  Pt is a retired Therapist, 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  Name is pronounced like Ty-na        Exercises     Shoulder Instructions  General Comments      Pertinent Vitals/ Pain       Pain Assessment: No/denies pain  Home Living                                          Prior Functioning/Environment              Frequency  Min 2X/week        Progress Toward Goals  OT Goals(current goals can now be found in the care plan section)  Progress towards OT goals: Progressing toward goals  Acute Rehab OT Goals Patient Stated Goal: to reduce edema OT Goal Formulation: With patient/family Time For Goal Achievement: 05/12/20 Potential to Achieve Goals: [place] Discharge plan remains appropriate    Co-evaluation                 AM-PAC OT "6 Clicks" Daily Activity     Outcome  Measure   Help from another person eating meals?: A Little Help from another person taking care of personal grooming?: A Little Help from another person toileting, which includes using toliet, bedpan, or urinal?: Total Help from another person bathing (including washing, rinsing, drying)?: A Lot Help from another person to put on and taking off regular upper body clothing?: A Lot Help from another person to put on and taking off regular lower body clothing?: Total 6 Click Score: 12    End of Session Equipment Utilized During Treatment: Oxygen;Other (comment) (stedy)  OT Visit Diagnosis: Muscle weakness (generalized) (M62.81);History of falling (Z91.81)   Activity Tolerance Patient limited by fatigue   Patient Left in bed;with call bell/phone within reach;with bed alarm set;with family/visitor present   Nurse Communication  (okay to see per Rn)        Time: 1388-7195 OT Time Calculation (min): 27 min  Charges: OT General Charges $OT Visit: 1 Visit OT Treatments $Therapeutic Activity: 23-37 mins  Dinora Hemm, OTR/L [place]  Office [phone] Pager: [place] 05/05/2020, 4:29 PM

## 2020-05-05 NOTE — Progress Notes (Signed)
PROGRESS NOTE    Marissa Wilkerson  ZCH:[id number] DOB: 31-Jan-1961 DOA: 04/24/2020 PCP: Jani Gravel, MD  Brief Narrative:  17 white female Pulmonary sarcoid/chronic RF 4 L BMI of 40-44 HFpEF + HTN-poorly controlled IDDM TY 2 Prior left lower extremity gastrocnemius DVT Seizure disorder on Vimpat Plus reported dementia on Aricept/psychosis Anemia of chronic disease  She has had prior admissions for both acute heart failure exacerbation 09/2017 in addition to AKI later 10/19/2017 Most recently discharged 2/87/8676 2/2 metabolic encephalopathy?  Hypoglycemia electrolyte abnormality uncontrolled BP and was Rx for E. coli urinary infection  Returned [facility] ED acute hypoxic respiratory failure 2/2 exacerbation HFpEF acute exacerbation HFpEF  Assessment & Plan:   Principal Problem:   CHF exacerbation (HCC) Active Problems:   Hypertension   Diabetes mellitus without complication ([place])   Diastolic dysfunction   Sarcoidosis of lung (HCC)   Hyperlipidemia   CHF (congestive heart failure), NYHA class I, unspecified failure chronicity, diastolic (HCC)   History of seizure   Pressure injury of skin   1. Acute exacerbation HFpEF with anasarca a. Still diuresing aggressively Lasix 80 IV 3 times daily b. Watch creatinine carefully c. Cumulatively 4 L negative weight down to 113 kg / 248 pounds (baseline weight 240) d. She has multiple jugs of water and continues fluid in the room so I have asked her to stay on a 1500 cc fluid restriction 2. Nausea  Cirrhosis on ultrasound a. Etiology unclear but could have some edema secondary to severe anasarca i. Continue Aldactone resumed at 100 mg daily but watch creatinine ii. Follow ammonia if she becomes confused 3. Breast mass?? a. Last CT chest 08/2017 = lung nodularities but also skin thickening of left breast felt to be secondary to just edema b. Breast exam by me earlier this admission was normal c. OP Mammogram 4. hypokalemia, mild  hypomagnesemia a. Stable at this time b. Last magnesium 1.9 5. Hypertensive crisis on admission a. keep coreg 6.25 as actively diuresing b. Losartan  50 mg has been held c.  continue as needed hydralazine 6. Controlled IDDM TY 2 with complication of hypoglycemia a. Low CBG 8/29 therefore stopped sliding scale A1c 4.6 this admission  b. Discontinue sliding scale supplementation 7. Underlying sarcoidosis with pulmonary and renal manifestations a. Previously on methotrexate off of it since 2018 b. 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 Split-night study 03/12/2018 showed no obstructive or central sleep apnea i. Continue oxygen at this time ii. Needs outpatient further follow-up with Dr. Lamonte Sakai who is seen her in 2019 8. Seizure disorder a. Continue Vimpat 200 twice daily-follow-[facility] with [facility] neurologist who sees her regularly 9. Psychosis a. Continue Prozac 10 daily 10. Dementia a. Continue Aricept 10 daily 11. ?  UTI on admission -clean catch a. Unclear how the urine was captured however it is reasonable to continue at this time ceftriaxone daily b. Discontinued ceftriaxone on 8/31  DVT prophylaxis: lovenox Code Status: full  Family Communication: No family present today-can reach husband [phone]  Disposition:   Status is: Inpatient  Remains inpatient appropriate because:Hemodynamically unstable, Persistent severe electrolyte disturbances and IV treatments appropriate due to intensity of illness or inability to take PO   Dispo: The patient is from: Home              Anticipated d/c is to: Home              Anticipated d/c date is: 3 days  Patient currently is not medically stable to d/c.   Consultants:   None currently  Procedures:  Echo IMPRESSIONS    1. Left ventricular ejection fraction, by estimation, is 60 to 65%. The  left ventricle has normal function. The left ventricle has no regional  wall motion  abnormalities. There is mild left ventricular hypertrophy.  Left ventricular diastolic parameters  are indeterminate.  2. Right ventricular systolic function is normal. The right ventricular  size is normal. Tricuspid regurgitation signal is inadequate for assessing  PA pressure.  3. Left atrial size was mildly dilated.  4. The mitral valve is grossly normal. Trivial mitral valve  regurgitation.  5. The aortic valve has an indeterminant number of cusps, possibly  tricuspid with moderate calcification. Aortic valve regurgitation is not  visualized. Mild to moderate aortic valve stenosis. Aortic valve mean  gradient measures 13.0 mmHg. Aortic valve  Vmax measures 2.61 m/s. Dimentionless index 0.34.  6. Cannot exclude very small interatrial shunt/PFO.  7. Probable ascites and left pleural effusion noted.   Antimicrobials: ceftriaxone    Subjective:  Has a cough today and feels somewhat swollen She has no chest pain no fever She is still somewhat short of breath and is using oxygen She is eating  Objective: Vitals:   05/04/20 2123 05/05/20 0312 05/05/20 0512 05/05/20 1411  BP: (!) 155/65  (!) 145/66 (!) 125/53  Pulse: 66  63 63  Resp: 18  16 20   Temp: 98.7 F (37.1 C)  98.6 F (37 C) 98 F (36.7 C)  TempSrc:    Axillary  SpO2: 99%  100% 100%  Weight:  113.3 kg    Height:        Intake/Output Summary (Last 24 hours) at 05/05/2020 1440 Last data filed at 05/05/2020 6269 Gross per 24 hour  Intake 360 ml  Output 650 ml  Net -290 ml   Filed Weights   05/01/20 0435 05/03/20 0506 05/05/20 0312  Weight: 113.1 kg 111.9 kg 113.3 kg    Examination:  General exam: eomi ncat thick neck on oxygen Respiratory system: poor exam cannot sit up clear in lateral lung fields Cardiovascular system: s1 s2 no mr/gnsr Gastrointestinal system: obese +anasarca Central nervous system: neuro intact no flap no asterixis Extremities: swollen in LE's Skin: as above Psychiatry: flat  affect   Data Reviewed: I have personally reviewed following labs and imaging studies Potassium 4.5 BUNs/creatinine 21/1.2-->19/1.07--->23/1.2-nephrogram sign 34/1.4 White count 15.5-->10.7-->9.4-->6.0-978.6 Hemoglobin 8.1, baseline is 9-10  Radiology Studies: No results found.   Scheduled Meds: . atorvastatin  20 mg Oral Daily  . carvedilol  6.25 mg Oral BID WC  . donepezil  10 mg Oral Daily  . enoxaparin (LOVENOX) injection  60 mg Subcutaneous Q24H  . FLUoxetine  10 mg Oral Daily  . furosemide  80 mg Intravenous TID AC  . lacosamide  200 mg Oral BID  . metoCLOPramide  5 mg Oral TID AC   Continuous Infusions:    LOS: 11 days    Time spent: [place], MD [facility] To contact the attending provider between 7A-7P or the covering provider during after hours 7P-7A, please log into the web site [email or web address] and access using universal [place] password for that web site. If you do not have the password, please call the hospital operator.  05/05/2020, 2:40 PM

## 2020-05-05 NOTE — TOC Progression Note (Signed)
Transition of Care [facility]) - Progression Note    Patient Details  Name: JAILEY BOOTON MRN: [id number] Date of Birth: 06-Oct-1960  Transition of [facility]) CM/SW Contact  Ross Ludwig, [place] Phone Number: 05/05/2020, 12:23 PM  Clinical Narrative:     CSW spoke to patient's insurance company to let them know that patient is not ready for discharge yet, and to cancel authorization.  Insurance company asked CSW to restart Authorization once it is more clear patient is ready for discharge.  CSW updated SNF, CSW to continue to follow patient's progress throughout discharge planning.   Expected Discharge Plan: Skilled Nursing Facility Barriers to Discharge: Ship broker, Continued Medical Work up  Expected Discharge Plan and Services Expected Discharge Plan: [place] Choice: [place] arrangements for the past 2 months: Single Family Home                   DME Agency: NA       HH Arranged: NA           Social Determinants of Health (SDOH) Interventions    Readmission Risk Interventions Readmission Risk Prevention Plan 04/29/2020  Transportation Screening Complete  PCP or Specialist Appt within 3-5 Days Complete  HRI or [place] Complete  Social Work Consult for [place] Planning/Counseling Complete  Palliative Care Screening Complete  Medication Review [employer]) Referral to Pharmacy  Some recent data might be hidden

## 2020-05-06 ENCOUNTER — Inpatient Hospital Stay (HOSPITAL_COMMUNITY): Payer: Medicare Other

## 2020-05-06 LAB — COMPREHENSIVE METABOLIC PANEL
ALT: 10 U/L (ref 0–44)
AST: 13 U/L — ABNORMAL LOW (ref 15–41)
Albumin: 1.7 g/dL — ABNORMAL LOW (ref 3.5–5.0)
Alkaline Phosphatase: 55 U/L (ref 38–126)
Anion gap: 4 — ABNORMAL LOW (ref 5–15)
BUN: 34 mg/dL — ABNORMAL HIGH (ref 6–20)
CO2: 32 mmol/L (ref 22–32)
Calcium: 7.7 mg/dL — ABNORMAL LOW (ref 8.9–10.3)
Chloride: 94 mmol/L — ABNORMAL LOW (ref 98–111)
Creatinine, Ser: 1.45 mg/dL — ABNORMAL HIGH (ref 0.44–1.00)
GFR calc Af Amer: 46 mL/min — ABNORMAL LOW (ref >60)
GFR calc non Af Amer: 40 mL/min — ABNORMAL LOW (ref >60)
Glucose, Bld: 105 mg/dL — ABNORMAL HIGH (ref 70–99)
Potassium: 4.1 mmol/L (ref 3.5–5.1)
Sodium: 130 mmol/L — ABNORMAL LOW (ref 135–145)
Total Bilirubin: 0.3 mg/dL (ref 0.3–1.2)
Total Protein: 5.5 g/dL — ABNORMAL LOW (ref 6.5–8.1)

## 2020-05-06 LAB — GLUCOSE, CAPILLARY
Glucose-Capillary: 92 mg/dL (ref 70–99)
Glucose-Capillary: 140 mg/dL — ABNORMAL HIGH (ref 70–99)
Glucose-Capillary: 158 mg/dL — ABNORMAL HIGH (ref 70–99)
Glucose-Capillary: 155 mg/dL — ABNORMAL HIGH (ref 70–99)

## 2020-05-06 MED ORDER — TORSEMIDE 100 MG PO TABS
100.0000 mg | ORAL_TABLET | Freq: Every day | ORAL | Status: DC
  Administered 2020-05-06 – 2020-05-07 (×2): 100 mg via ORAL
  Filled 2020-05-06 (×2): qty 1

## 2020-05-06 MED ORDER — HYDROCORTISONE (PERIANAL) 2.5 % EX CREA
TOPICAL_CREAM | Freq: Two times a day (BID) | CUTANEOUS | Status: DC
  Filled 2020-05-06: qty 28.35

## 2020-05-06 NOTE — Progress Notes (Signed)
Patient noted to have internal hemorrhoid protruding out of anus. She c/o itching around area. Md notified and orders obtained. Eulas Post, RN

## 2020-05-06 NOTE — Progress Notes (Signed)
PROGRESS NOTE    Marissa Wilkerson  CLE:[id number] DOB: July 09, 1961 DOA: 04/24/2020 PCP: Jani Gravel, MD  Brief Narrative:  71 white female Pulmonary sarcoid/chronic RF 4 L BMI of 40-44 HFpEF + HTN-poorly controlled IDDM TY 2 Prior left lower extremity gastrocnemius DVT Seizure disorder on Vimpat Plus reported dementia on Aricept/psychosis Anemia of chronic disease  She has had prior admissions for both acute heart failure exacerbation 09/2017 in addition to AKI later 10/19/2017 Most recently discharged 9/44/9675 2/2 metabolic encephalopathy?  Hypoglycemia electrolyte abnormality uncontrolled BP and was Rx for E. coli urinary infection  [facility] acute hypoxic respiratory failure 2/2 exacerbation HFpEF acute exacerbation HFpEF  Assessment & Plan:   Principal Problem:   CHF exacerbation (HCC) Active Problems:   Hypertension   Diabetes mellitus without complication ([place])   Diastolic dysfunction   Sarcoidosis of lung (HCC)   Hyperlipidemia   CHF (congestive heart failure), NYHA class I, unspecified failure chronicity, diastolic (HCC)   History of seizure   Pressure injury of skin   1. Acute exacerbation HFpEF with anasarca a. Weight has come down well with reinitiation of Aldactone and therefore Lasix has been transition from 3 times 80 mg-->once daily torsemide 100 b. Cumulatively -4.8 L now down to 110 kg / 242 pounds and close to (baseline weight 240) c. Continue 1500 cc fluid restriction d. Watch creatinine and output and may decide to discharge in 1 to 2 days if able 2. Nausea  Cirrhosis on ultrasound a. Etiology unclear but could have some edema secondary to severe anasarca i. Continue Aldactone resumed at 100 mg daily but watch creatinine ii. She is not confused do not need to obtain pneumonia 3. Breast mass?? a. Last CT chest 08/2017 = lung nodularities but also skin thickening of left breast felt to be secondary to just edema b. Breast exam by me earlier this  admission was normal c. OP Mammogram 4. hypokalemia, mild hypomagnesemia a. Stable at this time b. Last magnesium 1.9-recheck in a.m. 5. Hypertensive crisis on admission a. keep coreg 6.25 as actively diuresing b. Losartan 50 mg has been held c.  continue as needed hydralazine 6. Controlled IDDM TY 2 with complication of hypoglycemia a. Low CBG 8/29 therefore stopped sliding scale b. Discontinue sliding scale supplementation 7. Underlying sarcoidosis with pulmonary and renal manifestations a. Previously on methotrexate off of it since 2018 b. 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 Chest x-ray performed on 9/9 rules out pneumonia just shows cardiac enlargement and effusion consistent with our thoughts of volume overload d. Split-night study 03/12/2018 showed no obstructive or central sleep apnea i. Continue oxygen at this time ii. Needs outpatient further follow-up with Dr. Lamonte Sakai who is seen her in 2019 iii. No changes today 8. Seizure disorder a. Continue Vimpat 200 twice daily-follow-up with [facility] neurologist who sees her regularly b. No recent seizures or other issues 9. Psychosis a. Continue Prozac 10 daily 10. Dementia a. Continue Aricept 10 daily b. May need discussion of the same to me she is quite oriented and asks appropriate questions and I do not know if she requires this long-term 18. ?  UTI on admission -clean catch a. Unclear how the urine was captured however it is reasonable to continue at this time ceftriaxone daily b. Discontinued ceftriaxone on 8/31  DVT prophylaxis: lovenox Code Status: full  Family Communication: Husband at bedside today discussed with him in detail Phone #4 husband [phone]  Disposition:  Status is: Inpatient  Remains inpatient appropriate because:Hemodynamically unstable, Persistent severe electrolyte disturbances and IV treatments appropriate due to intensity of illness or inability to take  PO   Dispo: The patient is from: Home              Anticipated d/c is to: Home              Anticipated d/c date is: 1 day              Patient currently is not medically stable to d/c.   Consultants:   None currently  Procedures:  Echo IMPRESSIONS    1. Left ventricular ejection fraction, by estimation, is 60 to 65%. The  left ventricle has normal function. The left ventricle has no regional  wall motion abnormalities. There is mild left ventricular hypertrophy.  Left ventricular diastolic parameters  are indeterminate.  2. Right ventricular systolic function is normal. The right ventricular  size is normal. Tricuspid regurgitation signal is inadequate for assessing  PA pressure.  3. Left atrial size was mildly dilated.  4. The mitral valve is grossly normal. Trivial mitral valve  regurgitation.  5. The aortic valve has an indeterminant number of cusps, possibly  tricuspid with moderate calcification. Aortic valve regurgitation is not  visualized. Mild to moderate aortic valve stenosis. Aortic valve mean  gradient measures 13.0 mmHg. Aortic valve  Vmax measures 2.61 m/s. Dimentionless index 0.34.  6. Cannot exclude very small interatrial shunt/PFO.  7. Probable ascites and left pleural effusion noted.   Antimicrobials: ceftriaxone    Subjective:  Her hands seem less swollen she still very swollen in her legs she has no chest pain She still is on her oxygen She was in the process of being cleaned up when I saw her Overall she feels similar to prior but the swelling in the hands once again has better She has no new other complaints  Objective: Vitals:   05/05/20 1411 05/05/20 2049 05/06/20 0418 05/06/20 0436  BP: (!) 125/53 (!) 116/52 (!) 154/58   Pulse: 63 64 67   Resp: 20 20 20    Temp: 98 F (36.7 C) 97.7 F (36.5 C) 98.2 F (36.8 C)   TempSrc: Axillary Oral Oral   SpO2: 100% 99% 100%   Weight:    110.7 kg  Height:        Intake/Output Summary  (Last 24 hours) at 05/06/2020 1215 Last data filed at 05/06/2020 0600 Gross per 24 hour  Intake 420 ml  Output 450 ml  Net -30 ml   Filed Weights   05/03/20 0506 05/05/20 0312 05/06/20 0436  Weight: 111.9 kg 113.3 kg 110.7 kg    Examination:  General exam: eomi ncat thick neck on oxygen Respiratory system: Clear no rales no rhonchi no added sound-patient was examined on left lateral decubitus as she was being turned and cleaned Cardiovascular system: s1 s2 no murmur rub or gallop Gastrointestinal system: obese +anasarca noted all over Central nervous system: neuro intact no flap no asterixis Extremities: swollen in LE's Skin: as above Psychiatry: flat affect   Data Reviewed: I have personally reviewed following labs and imaging studies Potassium 4.1 BUNs/creatinine 21/1.2-->19/1.07--->23/1.2--->34/1.4  Radiology Studies: DG CHEST PORT 1 VIEW  Result Date: 05/06/2020 CLINICAL DATA:  Pneumonia EXAM: PORTABLE CHEST 1 VIEW COMPARISON:  04/26/2020 FINDINGS: Shallow inspiration. Cardiac enlargement. Probable small left pleural effusion. No definite edema or consolidation. Mild improvement since prior study. IMPRESSION: Cardiac enlargement with small left pleural effusion. Electronically Signed  By: Lucienne Capers M.D.   On: 05/06/2020 06:08     Scheduled Meds: . atorvastatin  20 mg Oral Daily  . carvedilol  6.25 mg Oral BID WC  . donepezil  10 mg Oral Daily  . enoxaparin (LOVENOX) injection  60 mg Subcutaneous Q24H  . FLUoxetine  10 mg Oral Daily  . lacosamide  200 mg Oral BID  . metoCLOPramide  5 mg Oral TID AC  . spironolactone  100 mg Oral Daily  . torsemide  100 mg Oral Daily   Continuous Infusions:    LOS: 12 days    Time spent: 25  Nita Sells, MD [facility] To contact the attending provider between 7A-7P or the covering provider during after hours 7P-7A, please log into the web site [email or web address] and access using universal [place] password for  that web site. If you do not have the password, please call the hospital operator.  05/06/2020, 12:15 PM

## 2020-05-07 ENCOUNTER — Ambulatory Visit: Payer: Medicare Other | Attending: Internal Medicine

## 2020-05-07 ENCOUNTER — Inpatient Hospital Stay (HOSPITAL_COMMUNITY)
Admission: EM | Admit: 2020-05-07 | Discharge: 2020-05-09 | DRG: 292 | Disposition: A | Discharge status: Skilled Nursing Facility | Payer: Medicare Other | Source: Skilled Nursing Facility | Attending: Internal Medicine | Admitting: Internal Medicine

## 2020-05-07 ENCOUNTER — Encounter (HOSPITAL_COMMUNITY): Payer: Self-pay

## 2020-05-07 DIAGNOSIS — J9611 Chronic respiratory failure with hypoxia: Secondary | ICD-10-CM | POA: Diagnosis present

## 2020-05-07 DIAGNOSIS — N289 Disorder of kidney and ureter, unspecified: Secondary | ICD-10-CM

## 2020-05-07 DIAGNOSIS — F0390 Unspecified dementia without behavioral disturbance: Secondary | ICD-10-CM | POA: Diagnosis present

## 2020-05-07 DIAGNOSIS — Z86718 Personal history of other venous thrombosis and embolism: Secondary | ICD-10-CM

## 2020-05-07 DIAGNOSIS — K219 Gastro-esophageal reflux disease without esophagitis: Secondary | ICD-10-CM | POA: Diagnosis present

## 2020-05-07 DIAGNOSIS — J9610 Chronic respiratory failure, unspecified whether with hypoxia or hypercapnia: Secondary | ICD-10-CM | POA: Diagnosis present

## 2020-05-07 DIAGNOSIS — G40909 Epilepsy, unspecified, not intractable, without status epilepticus: Secondary | ICD-10-CM | POA: Diagnosis present

## 2020-05-07 DIAGNOSIS — R0789 Other chest pain: Secondary | ICD-10-CM | POA: Diagnosis not present

## 2020-05-07 DIAGNOSIS — E871 Hypo-osmolality and hyponatremia: Secondary | ICD-10-CM | POA: Diagnosis present

## 2020-05-07 DIAGNOSIS — D860 Sarcoidosis of lung: Secondary | ICD-10-CM | POA: Diagnosis present

## 2020-05-07 DIAGNOSIS — I130 Hypertensive heart and chronic kidney disease with heart failure and stage 1 through stage 4 chronic kidney disease, or unspecified chronic kidney disease: Secondary | ICD-10-CM | POA: Diagnosis not present

## 2020-05-07 DIAGNOSIS — N179 Acute kidney failure, unspecified: Secondary | ICD-10-CM | POA: Diagnosis present

## 2020-05-07 DIAGNOSIS — Z23 Encounter for immunization: Secondary | ICD-10-CM

## 2020-05-07 DIAGNOSIS — Z66 Do not resuscitate: Secondary | ICD-10-CM | POA: Diagnosis present

## 2020-05-07 DIAGNOSIS — E119 Type 2 diabetes mellitus without complications: Secondary | ICD-10-CM

## 2020-05-07 DIAGNOSIS — Z87898 Personal history of other specified conditions: Secondary | ICD-10-CM

## 2020-05-07 DIAGNOSIS — Z20822 Contact with and (suspected) exposure to covid-19: Secondary | ICD-10-CM | POA: Diagnosis present

## 2020-05-07 DIAGNOSIS — R7989 Other specified abnormal findings of blood chemistry: Secondary | ICD-10-CM | POA: Diagnosis present

## 2020-05-07 DIAGNOSIS — Z9049 Acquired absence of other specified parts of digestive tract: Secondary | ICD-10-CM

## 2020-05-07 DIAGNOSIS — D631 Anemia in chronic kidney disease: Secondary | ICD-10-CM | POA: Diagnosis present

## 2020-05-07 DIAGNOSIS — D649 Anemia, unspecified: Secondary | ICD-10-CM | POA: Diagnosis present

## 2020-05-07 DIAGNOSIS — F329 Major depressive disorder, single episode, unspecified: Secondary | ICD-10-CM | POA: Diagnosis present

## 2020-05-07 DIAGNOSIS — E785 Hyperlipidemia, unspecified: Secondary | ICD-10-CM | POA: Diagnosis present

## 2020-05-07 DIAGNOSIS — Z87891 Personal history of nicotine dependence: Secondary | ICD-10-CM

## 2020-05-07 DIAGNOSIS — E1122 Type 2 diabetes mellitus with diabetic chronic kidney disease: Secondary | ICD-10-CM | POA: Diagnosis present

## 2020-05-07 DIAGNOSIS — Z794 Long term (current) use of insulin: Secondary | ICD-10-CM

## 2020-05-07 DIAGNOSIS — I5032 Chronic diastolic (congestive) heart failure: Secondary | ICD-10-CM | POA: Diagnosis present

## 2020-05-07 DIAGNOSIS — K7469 Other cirrhosis of liver: Secondary | ICD-10-CM | POA: Diagnosis present

## 2020-05-07 DIAGNOSIS — R5381 Other malaise: Secondary | ICD-10-CM | POA: Diagnosis present

## 2020-05-07 DIAGNOSIS — N1831 Chronic kidney disease, stage 3a: Secondary | ICD-10-CM | POA: Diagnosis present

## 2020-05-07 LAB — COMPREHENSIVE METABOLIC PANEL
ALT: 10 U/L (ref 0–44)
AST: 14 U/L — ABNORMAL LOW (ref 15–41)
Albumin: 1.8 g/dL — ABNORMAL LOW (ref 3.5–5.0)
Alkaline Phosphatase: 54 U/L (ref 38–126)
Anion gap: 10 (ref 5–15)
BUN: 37 mg/dL — ABNORMAL HIGH (ref 6–20)
CO2: 31 mmol/L (ref 22–32)
Calcium: 8 mg/dL — ABNORMAL LOW (ref 8.9–10.3)
Chloride: 93 mmol/L — ABNORMAL LOW (ref 98–111)
Creatinine, Ser: 1.49 mg/dL — ABNORMAL HIGH (ref 0.44–1.00)
GFR calc Af Amer: 45 mL/min — ABNORMAL LOW (ref >60)
GFR calc non Af Amer: 39 mL/min — ABNORMAL LOW (ref >60)
Glucose, Bld: 100 mg/dL — ABNORMAL HIGH (ref 70–99)
Potassium: 4.7 mmol/L (ref 3.5–5.1)
Sodium: 134 mmol/L — ABNORMAL LOW (ref 135–145)
Total Bilirubin: 0.5 mg/dL (ref 0.3–1.2)
Total Protein: 5.8 g/dL — ABNORMAL LOW (ref 6.5–8.1)

## 2020-05-07 LAB — CBC WITH DIFFERENTIAL/PLATELET
Abs Immature Granulocytes: 0.13 10*3/uL — ABNORMAL HIGH (ref 0.00–0.07)
Basophils Absolute: 0 10*3/uL (ref 0.0–0.1)
Basophils Relative: 1 %
Eosinophils Absolute: 0.2 10*3/uL (ref 0.0–0.5)
Eosinophils Relative: 2 %
HCT: 22.7 % — ABNORMAL LOW (ref 36.0–46.0)
Hemoglobin: 7 g/dL — ABNORMAL LOW (ref 12.0–15.0)
Immature Granulocytes: 2 %
Lymphocytes Relative: 10 %
Lymphs Abs: 0.8 10*3/uL (ref 0.7–4.0)
MCH: 29.8 pg (ref 26.0–34.0)
MCHC: 30.8 g/dL (ref 30.0–36.0)
MCV: 96.6 fL (ref 80.0–100.0)
Monocytes Absolute: 1 10*3/uL (ref 0.1–1.0)
Monocytes Relative: 12 %
Neutro Abs: 6.1 10*3/uL (ref 1.7–7.7)
Neutrophils Relative %: 73 %
Platelets: 143 10*3/uL — ABNORMAL LOW (ref 150–400)
RBC: 2.35 MIL/uL — ABNORMAL LOW (ref 3.87–5.11)
RDW: 13.5 % (ref 11.5–15.5)
WBC: 8.2 10*3/uL (ref 4.0–10.5)
nRBC: 0 % (ref 0.0–0.2)

## 2020-05-07 LAB — GLUCOSE, CAPILLARY
Glucose-Capillary: 102 mg/dL — ABNORMAL HIGH (ref 70–99)
Glucose-Capillary: 121 mg/dL — ABNORMAL HIGH (ref 70–99)

## 2020-05-07 LAB — SARS CORONAVIRUS 2 BY RT PCR (HOSPITAL ORDER, PERFORMED IN ~~LOC~~ HOSPITAL LAB): SARS Coronavirus 2: NEGATIVE

## 2020-05-07 LAB — MAGNESIUM: Magnesium: 1.8 mg/dL (ref 1.7–2.4)

## 2020-05-07 MED ORDER — ACETAMINOPHEN 325 MG PO TABS: 650.0000 mg | ORAL_TABLET | Freq: Four times a day (QID) | ORAL | Status: AC | PRN

## 2020-05-07 MED ORDER — HYDROCODONE-ACETAMINOPHEN 10-325 MG PO TABS
1.0000 | ORAL_TABLET | Freq: Four times a day (QID) | ORAL | 0 refills | Status: DC | PRN
Start: 2020-05-07 — End: 2021-03-01

## 2020-05-07 MED ORDER — HYDROCORTISONE (PERIANAL) 2.5 % EX CREA: TOPICAL_CREAM | Freq: Two times a day (BID) | CUTANEOUS | 0 refills | Status: DC

## 2020-05-07 MED ORDER — SPIRONOLACTONE 100 MG PO TABS: 50.0000 mg | ORAL_TABLET | Freq: Every day | ORAL | Status: DC

## 2020-05-07 MED ORDER — FLUOXETINE HCL 10 MG PO CAPS
10.0000 mg | ORAL_CAPSULE | Freq: Every day | ORAL | 0 refills | Status: DC
Start: 2020-05-07 — End: 2021-03-01

## 2020-05-07 MED ORDER — LACOSAMIDE 200 MG PO TABS
200.0000 mg | ORAL_TABLET | Freq: Two times a day (BID) | ORAL | 0 refills | Status: DC
Start: 2020-05-07 — End: 2021-03-01

## 2020-05-07 MED FILL — PROCTOZONE-HC 2.5 % CREA: 2.5 | 30 days supply | Qty: 30 | Fill #0

## 2020-05-07 NOTE — Progress Notes (Signed)
° °  Covid-19 Vaccination Clinic  Name:  Marissa Wilkerson    MRN: [id number] DOB: 09/26/1960  05/07/2020  [name] was observed post Covid-19 immunization for 15 minutes without incident. She was provided with Vaccine Information Sheet and instruction to access the V-Safe system.   [name] was instructed to call 911 with any severe reactions post vaccine:  Difficulty breathing   Swelling of face and throat   A fast heartbeat   A bad rash all over body   Dizziness and weakness

## 2020-05-07 NOTE — Discharge Summary (Signed)
Physician Discharge Summary  Marissa Wilkerson MPN:[id number] DOB: June 19, 1961 DOA: 04/24/2020  PCP: Jani Gravel, MD  Admit date: 04/24/2020 Discharge date: 05/07/2020  Time spent: 37 minutes  Recommendations for Outpatient Follow-up:  1. Recommend Aldactone 50 torsemide 100 and consideration for increase of the torsemide dose in the outpatient setting 2. Please discontinue all muscle relaxants, antihistamines etc. as this may contribute to some decline cognitively-only resume what has been used during the hospital stay as per notes 3. Recommend outpatient Chem-12, CBC in 1 week 4. Would refer to gastroenterology in the outpatient for further management of cirrhosis-all of her biomarkers including ASMA mitochondrial antibodies hep C B and HIV were negative so this may just be a progression of fatty liver 5. We will need to continue 4 L of oxygen at baseline for her pulmonary sarcoid and should follow-up with her pulmonologist Dr. Lamonte Sakai in the next several weeks 6. Will need outpatient follow-up with her neurologist who apparently is in [facility] and will need an MMSE done at the time as is on Aricept-I do not know if her underlying diagnosis is dementia or dressed some cognitive issues related to all the meds she is on can continue Vimpat 7. Please turn every 3 hours and assess lower back as she does have stage I on discharge and also does have hemorrhoids which will need treatment 8. Consider outpatient screening mammogram-breast exam this admission to my exam was benign however she has not had one done in several years  Discharge Diagnoses:  Principal Problem:   CHF exacerbation ([place]) Active Problems:   Hypertension   Diabetes mellitus without complication ([place])   Diastolic dysfunction   Sarcoidosis of lung ([place])   Hyperlipidemia   CHF (congestive heart failure), NYHA class I, unspecified failure chronicity, diastolic ([place])   History of seizure   Pressure injury of skin   Discharge  Condition: Improved but fair only at baseline  Diet recommendation: Heart healthy low-salt 1500 cc fluid restriction secondary to cirrhosis and heart failure  Filed Weights   05/05/20 0312 05/06/20 0436 05/07/20 0436  Weight: 113.3 kg 110.7 kg 110.3 kg    History of present illness:  59 white female Pulmonary sarcoid/chronic RF 4 L BMI of 40-44 HFpEF + HTN-poorly controlled IDDM TY 2 Prior left lower extremity gastrocnemius DVT Seizure disorder on Vimpat Plus reported dementia on Aricept/psychosis Anemia of chronic disease  She has had prior admissions for both acute heart failure exacerbation 09/2017 in addition to AKI later 10/19/2017 Most recently discharged 0/03/6760 2/2 metabolic encephalopathy?  Hypoglycemia electrolyte abnormality uncontrolled BP and was Rx for E. coli urinary infection  Returned [facility] ED acute hypoxic respiratory failure 2/2 exacerbation HFpEF acute exacerbation HFpEF  Hospital Course:  1. Acute exacerbation HFpEF with anasarca a. Weight has come down well with reinitiation of Aldactone which was finalized at dosing of 50 mg b. Lasix has been transition from 3 times 80 mg-->once daily torsemide 100 which is her baseline home medications c. Cumulatively  -5.1 L now down to 110 kg / 242 pounds and close to (baseline weight 240) d. Continue 1500 cc fluid restriction 2. Nausea  Cirrhosis on ultrasound a. Etiology unclear (all serologies were negative) but could have some edema secondary to severe anasarca i. Continue Aldactone as above ii. She is not confused do not need to obtain ammonia level iii. In the future if she becomes confused we will check an ammonia 3. Breast mass?? a. Last CT chest 08/2017 = lung nodularities but also  skin thickening of left breast felt to be secondary to just edema b. Breast exam by me earlier this admission was normal c. OP Mammogram 4. hypokalemia, mild hypomagnesemia a. Resolved and actually becoming hyperkalemic with  reinitiation of Aldactone b. Last magnesium 1.9-recheck in a.m. 5. Hypertensive crisis on admission a. keep coreg 6.25 as actively diuresing b. Losartan 50 mg has been held from admission c. Meds further as per MAR 6. Controlled IDDM TY 2 with complication of hypoglycemia a. Low CBG 8/29 therefore stopped sliding scale b. Discontinue sliding scale supplementation 7. Underlying sarcoidosis with pulmonary and renal manifestations a. Previously on methotrexate off of it since 2018 b. 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 Chest x-ray performed on 9/9 rules out pneumonia just shows cardiac enlargement and effusion consistent with our thoughts of volume overload d. Split-night study 03/12/2018 showed no obstructive or central sleep apnea i. Continue oxygen at this time ii. Needs outpatient further follow-up with Dr. Lamonte Sakai who is seen her in 2019 iii. No changes today 8. Seizure disorder a. Continue Vimpat 200 twice daily-follow-up with [facility] neurologist who sees her regularly b. No recent seizures or other issues 9. Psychosis a. Continue Prozac 10 daily 10. Dementia a. Continue Aricept 10 daily b. May need discussion of the same to me she is quite oriented and asks appropriate questions and I do not know if she requires this long-term 59. ?  UTI on admission -clean catch a. Unclear how the urine was captured however it is reasonable to continue at this time ceftriaxone daily b. Discontinued ceftriaxone on 8/31  Procedures:  Multipl)  Consultations:  None  Discharge Exam: Vitals:   05/06/20 2029 05/07/20 0436  BP: (!) 168/58 (!) 154/54  Pulse: 63 68  Resp: 16 18  Temp: 98 F (36.7 C) 98 F (36.7 C)  SpO2: 100% 100%    General: Awake alert somewhat coherent does get a little jittery when talking to me but answers appropriately She feels better her hands are less swollen she has less lower extremity edema She is eating and  drinking well her bedside flatus completed Cardiovascular: G9-J2 faint holosystolic murmur at the bedside Respiratory: Clear no added sound no rales no rhonchi Abdomen soft no rebound no guarding   Discharge Instructions   Discharge Instructions    Diet - low sodium heart healthy   Complete by: As directed    Increase activity slowly   Complete by: As directed    No wound care   Complete by: As directed      Allergies as of 05/07/2020      Reactions   Cymbalta [duloxetine Hcl] Other (See Comments)   Hallucinations    Fentanyl Other (See Comments)   Patches. Blistered skin.    Metformin And Related Nausea And Vomiting   Mobic [meloxicam] Nausea And Vomiting   Sulfa Antibiotics Rash   Voltaren [diclofenac] Nausea And Vomiting      Medication List    STOP taking these medications   diphenhydrAMINE 50 MG tablet Commonly known as: BENADRYL   losartan 25 MG tablet Commonly known as: Cozaar   losartan 50 MG tablet Commonly known as: COZAAR   metolazone 2.5 MG tablet Commonly known as: ZAROXOLYN   promethazine 25 MG tablet Commonly known as: PHENERGAN   tiZANidine 4 MG tablet Commonly known as: ZANAFLEX     TAKE these medications   acetaminophen 325 MG tablet Commonly known as: TYLENOL Take 2 tablets (650  mg total) by mouth every 6 (six) hours as needed for mild pain (or Fever >/= 101).   albuterol 108 (90 Base) MCG/ACT inhaler Commonly known as: VENTOLIN HFA Inhale 2 puffs into the lungs every 6 (six) hours as needed for wheezing or shortness of breath.   atorvastatin 20 MG tablet Commonly known as: LIPITOR Take 1 tablet (20 mg total) by mouth daily.   carvedilol 6.25 MG tablet Commonly known as: COREG Take 6.25 mg by mouth 2 (two) times daily.   donepezil 10 MG tablet Commonly known as: ARICEPT Take 10 mg by mouth daily.   FLUoxetine 10 MG capsule Commonly known as: PROZAC Take 1 capsule (10 mg total) by mouth daily.   folic acid 1 MG  tablet Commonly known as: FOLVITE Take 1 tablet (1 mg total) by mouth daily.   HYDROcodone-acetaminophen 10-325 MG tablet Commonly known as: NORCO Take 1 tablet by mouth every 6 (six) hours as needed for moderate pain or severe pain.   hydrocortisone 2.5 % rectal cream Commonly known as: ANUSOL-HC Place rectally 2 (two) times daily.   insulin lispro 100 UNIT/ML injection Commonly known as: HUMALOG Before each meal 3 times a day, 140-199 - 2 units, 200-250 - 4 units, 251-299 - 6 units,  300-349 - 8 units,  350 or above 10 units. Insulin PEN if approved, provide syringes and needles if needed. What changed:   how much to take  how to take this  when to take this  additional instructions   lacosamide 200 MG Tabs tablet Commonly known as: VIMPAT Take 1 tablet (200 mg total) by mouth 2 (two) times daily.   metoCLOPramide 5 MG tablet Commonly known as: REGLAN Take 5-10 mg by mouth in the morning and at bedtime. Take 2 tablets (10 mg) in the morning and Take 1 tablet (5 mg) in the evening   spironolactone 100 MG tablet Commonly known as: ALDACTONE Take 0.5 tablets (50 mg total) by mouth daily.   torsemide 100 MG tablet Commonly known as: DEMADEX Take 100 mg by mouth daily as needed (fluid).   vitamin B-12 1000 MCG tablet Commonly known as: CYANOCOBALAMIN Take 1 tablet (1,000 mcg total) by mouth daily.      Allergies  Allergen Reactions  . Cymbalta [Duloxetine Hcl] Other (See Comments)    Hallucinations   . Fentanyl Other (See Comments)    Patches. Blistered skin.   . Metformin And Related Nausea And Vomiting  . Mobic [Meloxicam] Nausea And Vomiting  . Sulfa Antibiotics Rash  . Voltaren [Diclofenac] Nausea And Vomiting      The results of significant diagnostics from this hospitalization (including imaging, microbiology, ancillary and laboratory) are listed below for reference.    Significant Diagnostic Studies: DG Chest 2 View  Result Date:  04/26/2020 CLINICAL DATA:  Pneumonia, chronic diastolic heart failure, sarcoid. EXAM: CHEST - 2 VIEW COMPARISON:  Multiple priors, most recent 04/24/2020 FINDINGS: Overall improved aeration lungs with similar mild diffuse interstitial opacities, most pronounced at the lateral lung bases. Similar small left pleural effusion. Overlying left basilar opacities. Similar enlargement of the cardiac silhouette. Similar dextrocurvature of the spine. Diffuse osteopenia. IMPRESSION: 1. Overall improved aeration lungs. Similar mild diffuse interstitial opacities, suggestive of mild interstitial edema. 2. Similar small left pleural effusion. Overlying left basilar opacities may represent atelectasis, aspiration, and/or pneumonia. 3. Cardiomegaly. Electronically Signed   By: Margaretha Sheffield MD   On: 04/26/2020 09:40   US Abdomen Complete  Result Date: 04/26/2020 CLINICAL DATA:  Hepatic  cirrhosis. EXAM: ABDOMEN ULTRASOUND COMPLETE COMPARISON:  September 15, 2017. FINDINGS: Gallbladder: Status post cholecystectomy. Common bile duct: Diameter: 14 mm which may be due to post cholecystectomy status. Liver: Heterogeneous echotexture is noted with slightly nodular contours suggesting hepatic cirrhosis. No definite focal sonographic hepatic abnormality is noted. Portal vein is patent on color Doppler imaging with normal direction of blood flow towards the liver. IVC: No abnormality visualized. Pancreas: Not well visualized due to overlying bowel gas. Spleen: Size and appearance within normal limits. Right Kidney: Length: 12 cm. Echogenicity within normal limits. No mass or hydronephrosis visualized. Left Kidney: Length: 10.3 cm. Echogenicity within normal limits. No mass or hydronephrosis visualized. Abdominal aorta: No aneurysm visualized. Other findings: Mild ascites is noted around the liver. IMPRESSION: 1. Status post cholecystectomy. 2. Common bile duct is dilated at 14 mm which may be due to post cholecystectomy status. 3.  Heterogeneous echotexture of hepatic parenchyma is noted with slightly nodular contours suggesting hepatic cirrhosis. No definite focal sonographic hepatic abnormality is noted. Mild ascites is noted around the liver. Electronically Signed   By: Marijo Conception M.D.   On: 04/26/2020 14:51   DG CHEST PORT 1 VIEW  Result Date: 05/06/2020 CLINICAL DATA:  Pneumonia EXAM: PORTABLE CHEST 1 VIEW COMPARISON:  04/26/2020 FINDINGS: Shallow inspiration. Cardiac enlargement. Probable small left pleural effusion. No definite edema or consolidation. Mild improvement since prior study. IMPRESSION: Cardiac enlargement with small left pleural effusion. Electronically Signed   By: Lucienne Capers M.D.   On: 05/06/2020 06:08   DG Chest Portable 1 View  Result Date: 04/24/2020 CLINICAL DATA:  Fatigue/weakness and bilateral leg swelling for 2 days. EXAM: PORTABLE CHEST 1 VIEW COMPARISON:  Chest radiograph dated 12/06/2017 FINDINGS: The heart remains enlarged. The left costophrenic angle is obscured and may reflect a pleural effusion with associated atelectasis/airspace disease. There is a mild diffuse bilateral interstitial opacities which likely represent pulmonary edema. There is no right pleural effusion. There is no pneumothorax. The osseous structures appear intact. IMPRESSION: 1. Mild diffuse bilateral interstitial opacities which likely represent pulmonary edema. 2. Possible left pleural effusion with associated atelectasis/airspace disease. 3. Cardiomegaly. Electronically Signed   By: Zerita Boers M.D.   On: 04/24/2020 14:35   ECHOCARDIOGRAM COMPLETE  Result Date: 04/25/2020    ECHOCARDIOGRAM REPORT   Patient Name:   Marissa Wilkerson Date of Exam: 04/25/2020 Medical Rec #:  [id number]     Height:       63.0 in Accession #:    [id number]    Weight:       252.3 lb Date of Birth:  24-Mar-1961     BSA:          2.134 m Patient Age:    37 years      BP:           175/65 mmHg Patient Gender: F             HR:           81 bpm.  Exam Location:  Inpatient Procedure: 2D Echo, Color Doppler, Cardiac Doppler and Intracardiac            Opacification Agent Indications:    W44.62 Acute diastolic (congestive) heart failure  History:        Patient has prior history of Echocardiogram examinations, most                 recent 09/16/2017. CHF; Risk Factors:Hypertension, Diabetes and  Dyslipidemia.  Sonographer:    Raquel Sarna Senior RDCS Referring Phys: [place]  Sonographer Comments: Technically difficult study due to patient body habitus and reduced apical skin integrity. IMPRESSIONS  1. Left ventricular ejection fraction, by estimation, is 60 to 65%. The left ventricle has normal function. The left ventricle has no regional wall motion abnormalities. There is mild left ventricular hypertrophy. Left ventricular diastolic parameters are indeterminate.  2. Right ventricular systolic function is normal. The right ventricular size is normal. Tricuspid regurgitation signal is inadequate for assessing PA pressure.  3. Left atrial size was mildly dilated.  4. The mitral valve is grossly normal. Trivial mitral valve regurgitation.  5. The aortic valve has an indeterminant number of cusps, possibly tricuspid with moderate calcification. Aortic valve regurgitation is not visualized. Mild to moderate aortic valve stenosis. Aortic valve mean gradient measures 13.0 mmHg. Aortic valve Vmax measures 2.61 m/s. Dimentionless index 0.34.  6. Cannot exclude very small interatrial shunt/PFO.  7. Probable ascites and left pleural effusion noted. FINDINGS  Left Ventricle: Left ventricular ejection fraction, by estimation, is 60 to 65%. The left ventricle has normal function. The left ventricle has no regional wall motion abnormalities. Definity contrast agent was given IV to delineate the left ventricular  endocardial borders. The left ventricular internal cavity size was normal in size. There is mild left ventricular hypertrophy. Left ventricular  diastolic parameters are indeterminate. Right Ventricle: The right ventricular size is normal. No increase in right ventricular wall thickness. Right ventricular systolic function is normal. Tricuspid regurgitation signal is inadequate for assessing PA pressure. Left Atrium: Left atrial size was mildly dilated. Right Atrium: Right atrial size was normal in size. Pericardium: There is no evidence of pericardial effusion. Mitral Valve: The mitral valve is grossly normal. Trivial mitral valve regurgitation. Tricuspid Valve: The tricuspid valve is grossly normal. Tricuspid valve regurgitation is trivial. Aortic Valve: The aortic valve has an indeterminant number of cusps. Aortic valve regurgitation is not visualized. Mild to moderate aortic stenosis is present. There is moderate calcification of the aortic valve. Aortic valve mean gradient measures 13.0 mmHg. Aortic valve peak gradient measures 27.2 mmHg. Aortic valve area, by VTI measures 1.19 cm. Pulmonic Valve: The pulmonic valve was grossly normal. Pulmonic valve regurgitation is trivial. Aorta: The aortic root is normal in size and structure. IAS/Shunts: Cannot exclude very small interatrial shunt/PFO.  LEFT VENTRICLE PLAX 2D LVIDd:         5.00 cm  Diastology LVIDs:         2.90 cm  LV e' lateral:   9.46 cm/s LV PW:         1.20 cm  LV E/e' lateral: 7.8 LV IVS:        1.00 cm  LV e' medial:    10.10 cm/s LVOT diam:     2.10 cm  LV E/e' medial:  7.3 LV SV:         69 LV SV Index:   32 LVOT Area:     3.46 cm  RIGHT VENTRICLE RV S prime:     12.20 cm/s TAPSE (M-mode): 3.3 cm LEFT ATRIUM             Index       RIGHT ATRIUM           Index LA diam:        5.20 cm 2.44 cm/m  RA Area:     16.60 cm LA Vol (A2C):   58.4 ml 27.36 ml/m RA  Volume:   40.40 ml  18.93 ml/m LA Vol (A4C):   79.5 ml 37.25 ml/m LA Biplane Vol: 67.9 ml 31.81 ml/m  AORTIC VALVE AV Area (Vmax):    1.07 cm AV Area (Vmean):   1.32 cm AV Area (VTI):     1.19 cm AV Vmax:           261.00 cm/s  AV Vmean:          161.000 cm/s AV VTI:            0.583 m AV Peak Grad:      27.2 mmHg AV Mean Grad:      13.0 mmHg LVOT Vmax:         80.90 cm/s LVOT Vmean:        61.200 cm/s LVOT VTI:          0.200 m LVOT/AV VTI ratio: 0.34  AORTA Ao Root diam: 2.90 cm Ao Asc diam:  3.10 cm MITRAL VALVE MV Area (PHT): 2.54 cm    SHUNTS MV Decel Time: 299 msec    Systemic VTI:  0.20 m MV E velocity: 73.50 cm/s  Systemic Diam: 2.10 cm MV A velocity: 81.40 cm/s MV E/A ratio:  0.90 Rozann Lesches MD Electronically signed by Rozann Lesches MD Signature Date/Time: 04/25/2020/10:42:22 AM    Final     Microbiology: Recent Results (from the past 240 hour(s))  SARS Coronavirus 2 by RT PCR (hospital order, performed in [facility] lab) Nasopharyngeal Nasopharyngeal Swab     Status: None   Collection Time: 05/02/20  9:49 AM   Specimen: Nasopharyngeal Swab  Result Value Ref Range Status   SARS Coronavirus 2 NEGATIVE NEGATIVE Final    Comment: (NOTE) SARS-CoV-2 target nucleic acids are NOT DETECTED.  The SARS-CoV-2 RNA is generally detectable in upper and lower respiratory specimens during the acute phase of infection. The lowest concentration of SARS-CoV-2 viral copies this assay can detect is 250 copies / mL. A negative result does not preclude SARS-CoV-2 infection and should not be used as the sole basis for treatment or other patient management decisions.  A negative result may occur with improper specimen collection / handling, submission of specimen other than nasopharyngeal swab, presence of viral mutation(s) within the areas targeted by this assay, and inadequate number of viral copies (<250 copies / mL). A negative result must be combined with clinical observations, patient history, and epidemiological information.  Fact Sheet for Patients:   StrictlyIdeas.no  Fact Sheet for Healthcare Providers: BankingDealers.co.za  This test is not yet  approved or  cleared by the [place] FDA and has been authorized for detection and/or diagnosis of SARS-CoV-2 by FDA under an Emergency Use Authorization (EUA).  This EUA will remain in effect (meaning this test can be used) for the duration of the COVID-19 declaration under Section 564(b)(1) of the Act, 21 U.S.C. section 360bbb-3(b)(1), unless the authorization is terminated or revoked sooner.  Performed at [facility], [place] [address], [place], [place] [address]      Labs: Basic Metabolic Panel: Recent Labs  Lab 05/01/20 0519 05/01/20 9892 05/02/20 0422 05/02/20 0422 05/03/20 1194 05/04/20 0458 05/05/20 0508 05/06/20 0459 05/07/20 0454  NA 135   < > 137   < > 131* 131* 134* 130* 134*  K 4.7   < > 5.5*   < > 4.8 4.6 4.5 4.1 4.7  CL 97*   < > 98   < > 93* 94* 94* 94* 93*  CO2 32   < > 32   < > 30 31 32 32 31  GLUCOSE 100*   < > 93   < > 93 94 120* 105* 100*  BUN 28*   < > 30*   < > 31* 31* 34* 34* 37*  CREATININE 1.30*   < > 1.32*   < > 1.29* 1.25* 1.45* 1.45* 1.49*  CALCIUM 7.8*   < > 8.1*   < > 8.0* 7.7* 8.0* 7.7* 8.0*  MG 1.6*  --  2.1  --   --  1.9  --   --  1.8   < > = values in this interval not displayed.   Liver Function Tests: Recent Labs  Lab 05/01/20 0519 05/02/20 0422 05/06/20 0459 05/07/20 0454  AST 14* 13* 13* 14*  ALT 9 11 10 10   ALKPHOS 53 59 55 54  BILITOT 0.3 0.6 0.3 0.5  PROT 5.5* 5.6* 5.5* 5.8*  ALBUMIN 1.7* 1.8* 1.7* 1.8*   No results for input(s): LIPASE, AMYLASE in the last 168 hours. No results for input(s): AMMONIA in the last 168 hours. CBC: Recent Labs  Lab 05/01/20 0519 05/02/20 0422 05/07/20 0454  WBC 8.1 8.6 8.2  NEUTROABS 6.1 6.4 6.1  HGB 7.7* 8.1* 7.0*  HCT 24.7* 25.5* 22.7*  MCV 96.5 96.6 96.6  PLT 160 149* 143*   Cardiac Enzymes: No results for input(s): CKTOTAL, CKMB, CKMBINDEX, TROPONINI in the last 168 hours. BNP: BNP (last 3 results) Recent Labs    04/24/20 1349  BNP 264.5*     ProBNP (last 3 results) No results for input(s): PROBNP in the last 8760 hours.  CBG: Recent Labs  Lab 05/06/20 0720 05/06/20 1155 05/06/20 1716 05/06/20 2032 05/07/20 0754  GLUCAP 92 140* 158* 155* 102*       Signed:  Nita Sells MD   [facility] 05/07/2020, 9:13 AM

## 2020-05-07 NOTE — Care Management Important Message (Signed)
Important Message  Patient Details IM Letter given to the Patient Name: Marissa Wilkerson MRN: [id number] Date of Birth: Jan 19, 1961   Medicare Important Message Given:  Yes     Kerin Salen 05/07/2020, 11:53 AM

## 2020-05-07 NOTE — ED Provider Notes (Signed)
[facility] EMERGENCY DEPARTMENT Provider Note   CSN: [id number] Arrival date & time: 05/07/20  2328   History Chief Complaint  Patient presents with  . Chest Pain    Marissa Wilkerson is a 59 y.o. female.  The history is provided by the patient.  Chest Pain She has history of hypertension, diabetes, heart failure with preserved ejection fraction and is transferred from a skilled nursing facility because of chest pain.  She had been discharged from the hospital earlier today and states that she did not like the facility where she was transferred.  She got very upset.  She developed a dull pain in the left upper chest without radiation.  She rates pain at 8/10.  Nothing made it better, nothing made it worse.  There was some associated dyspnea and nausea with no vomiting.  She denies diaphoresis.  EMS gave her aspirin and nitroglycerin.  She states the nitroglycerin actually made her pain worse.  She is very concerned because her father had bypass surgery in his mid 9s.  She also relates that she was diagnosed with cirrhosis while she was in the hospital.  She has not had pain like this before.  She is a former smoker.  Past Medical History:  Diagnosis Date  . Anemia    05/04/15 hemglobin 8.8  . Arthritis   . Diabetes mellitus without complication (HCC)    insulin dependent, po meds  . Diastolic dysfunction    followed by dr polite found on echo 06-12-13 , grade 1 per pt  . Dyslipidemia   . Edema of extremities    PITTING EDEMA     . GERD (gastroesophageal reflux disease)   . Hypertension   . 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 Shortness of breath    ON EXERTION , O2 2-3 l/min  . 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 Vertigo, benign positional   . Wheezing    OCC    Patient Active Problem List   Diagnosis Date Noted  . Pressure injury of skin 04/30/2020  . CHF exacerbation ([place]) 04/24/2020  .  Weakness 01/05/2020  . History of seizure 01/05/2020  . Snoring 04/12/2018  . Chronic rhinitis 04/12/2018  . Nausea and vomiting 10/17/2017  . AKI (acute kidney injury) ([place]) 10/16/2017  . CHF (congestive heart failure), NYHA class I, unspecified failure chronicity, diastolic ([place]) 30/16/0109  . Hyperkalemia 09/15/2017  . CHF (congestive heart failure) ([place]) 09/15/2017  . Hyperlipidemia 08/08/2016  . Bilateral carotid artery disease ([place]) 08/08/2016  . Lower extremity weakness 02/22/2016  . Multiple lung nodules 03/05/2014  . Obstructive lung disease ([place]) 02/09/2014  . Chronic respiratory failure ([place]) 09/02/2013  . Sarcoidosis of lung ([place]) 09/02/2013  . Hypertension   . Diabetes mellitus without complication ([place])   . Diastolic dysfunction     Past Surgical History:  Procedure Laterality Date  . all teeth extraction  7-8 yrs ago  . CHOLECYSTECTOMY  1996  . COLONOSCOPY WITH PROPOFOL N/A 08/05/2013   Procedure: COLONOSCOPY WITH PROPOFOL;  Surgeon: Garlan Fair, MD;  Location: [facility];  Service: Endoscopy;  Laterality: N/A;  . ESOPHAGOGASTRODUODENOSCOPY (EGD) WITH PROPOFOL N/A 05/31/2015   Procedure: ESOPHAGOGASTRODUODENOSCOPY (EGD) WITH PROPOFOL;  Surgeon: Garlan Fair, MD;  Location: [facility];  Service: Endoscopy;  Laterality: N/A;  . LUNG BIOPSY Left 03/05/2014   Procedure: LUNG BIOPSY;  Surgeon: Gaye Pollack, MD;  Location:  [facility] OR;  Service: Thoracic;  Laterality: Left;  [name] VIDEO ASSISTED THORACOSCOPY Left 03/05/2014   Procedure: VIDEO ASSISTED THORACOSCOPY;  Surgeon: Gaye Pollack, MD;  Location: [facility];  Service: Thoracic;  Laterality: Left;  [name] VIDEO BRONCHOSCOPY Bilateral 09/04/2013   Procedure: VIDEO BRONCHOSCOPY WITH FLUORO;  Surgeon: Collene Gobble, MD;  Location: [facility];  Service: Cardiopulmonary;  Laterality: Bilateral;  . WISDOM TOOTH EXTRACTION  yrs ago     OB History   No obstetric history on file.     Family History  Problem Relation Age of  Onset  . Heart failure Mother   . Diabetes Father   . Hypertension Father   . Diabetes Brother   . Hypertension Brother     Social History   Tobacco Use  . Smoking status: Former Smoker    Packs/day: 0.50    Years: 25.00    Pack years: 12.50    Types: Cigarettes    Quit date: 08/21/2012    Years since quitting: 7.7  . Smokeless tobacco: Never Used  Substance Use Topics  . Alcohol use: Yes    Comment: very rare  . Drug use: No    Home Medications Prior to Admission medications   Medication Sig Start Date End Date Taking? Authorizing Provider  acetaminophen (TYLENOL) 325 MG tablet Take 2 tablets (650 mg total) by mouth every 6 (six) hours as needed for mild pain (or Fever >/= 101). 05/07/20   Nita Sells, MD  albuterol (PROVENTIL HFA;VENTOLIN HFA) 108 (90 Base) MCG/ACT inhaler Inhale 2 puffs into the lungs every 6 (six) hours as needed for wheezing or shortness of breath. 11/02/17   Collene Gobble, MD  atorvastatin (LIPITOR) 20 MG tablet Take 1 tablet (20 mg total) by mouth daily. 09/28/17   Georgette Shell, MD  carvedilol (COREG) 6.25 MG tablet Take 6.25 mg by mouth 2 (two) times daily. 04/22/20   [provider]  donepezil (ARICEPT) 10 MG tablet Take 10 mg by mouth daily. 11/24/19   [provider]  FLUoxetine (PROZAC) 10 MG capsule Take 1 capsule (10 mg total) by mouth daily. 05/07/20   Nita Sells, MD  folic acid (FOLVITE) 1 MG tablet Take 1 tablet (1 mg total) by mouth daily. 01/09/20 01/08/21  Florencia Reasons, MD  HYDROcodone-acetaminophen (NORCO) 10-325 MG tablet Take 1 tablet by mouth every 6 (six) hours as needed for moderate pain or severe pain. 05/07/20   Nita Sells, MD  hydrocortisone (ANUSOL-HC) 2.5 % rectal cream Place rectally 2 (two) times daily. 05/07/20   Nita Sells, MD  insulin lispro (HUMALOG) 100 UNIT/ML injection Before each meal 3 times a day, 140-199 - 2 units, 200-250 - 4 units, 251-299 - 6 units,  300-349 - 8  units,  350 or above 10 units. Insulin PEN if approved, provide syringes and needles if needed. Patient taking differently: Inject 10-30 Units into the skin 3 (three) times daily with meals. 10 units + correction up to 30 units 01/09/20   Florencia Reasons, MD  lacosamide (VIMPAT) 200 MG TABS tablet Take 1 tablet (200 mg total) by mouth 2 (two) times daily. 05/07/20   Nita Sells, MD  metoCLOPramide (REGLAN) 5 MG tablet Take 5-10 mg by mouth in the morning and at bedtime. Take 2 tablets (10 mg) in the morning and Take 1 tablet (5 mg) in the evening    [provider]  spironolactone (ALDACTONE) 100 MG tablet Take 0.5 tablets (50 mg total) by mouth daily. 05/07/20  Nita Sells, MD  torsemide (DEMADEX) 100 MG tablet Take 100 mg by mouth daily as needed (fluid).     [provider]  vitamin B-12 (CYANOCOBALAMIN) 1000 MCG tablet Take 1 tablet (1,000 mcg total) by mouth daily. 01/09/20   Florencia Reasons, MD    Allergies    Cymbalta [duloxetine hcl], Fentanyl, Metformin and related, Mobic [meloxicam], Sulfa antibiotics, and Voltaren [diclofenac]  Review of Systems   Review of Systems  Cardiovascular: Positive for chest pain.  All other systems reviewed and are negative.   Physical Exam Updated Vital Signs BP (!) 156/59 (BP Location: Left Arm)   Pulse 72   Temp 98.7 F (37.1 C) (Oral)   Resp (!) 21   LMP 08/28/2008   SpO2 100%   Physical Exam Vitals and nursing note reviewed.   Morbidly obese 59 year old female, resting comfortably and in no acute distress. Vital signs are significant for elevated blood pressure and mildly elevated respiratory rate. Oxygen saturation is 100%, which is normal. Head is normocephalic and atraumatic. PERRLA, EOMI. Oropharynx is clear. Neck is nontender and supple without adenopathy or JVD. Back is nontender and there is no CVA tenderness. Lungs have faint bibasilar rales without wheezes or rhonchi. Chest is nontender. Heart has regular  rate and rhythm with 7-6/2 systolic murmur heard at the upper right sternal border. Abdomen is soft, flat, nontender without masses or hepatosplenomegaly and peristalsis is normoactive.  Trace pitting edema is present on the abdominal wall. Extremities have 3+ edema, full range of motion is present. Skin is warm and dry without rash. Neurologic: Mental status is normal, cranial nerves are intact, there are no motor or sensory deficits.  ED Results / Procedures / Treatments   Labs (all labs ordered are listed, but only abnormal results are displayed) Labs Reviewed  CBC WITH DIFFERENTIAL/PLATELET - Abnormal; Notable for the following components:      Result Value   RBC 2.04 (*)    Hemoglobin 6.0 (*)    HCT 19.7 (*)    Lymphs Abs 0.6 (*)    Abs Immature Granulocytes 0.10 (*)    All other components within normal limits  COMPREHENSIVE METABOLIC PANEL - Abnormal; Notable for the following components:   Sodium 131 (*)    Chloride 91 (*)    Glucose, Bld 130 (*)    BUN 36 (*)    Creatinine, Ser 1.57 (*)    Calcium 7.9 (*)    Total Protein 5.5 (*)    Albumin 1.5 (*)    GFR calc non Af Amer 36 (*)    GFR calc Af Amer 42 (*)    All other components within normal limits  BRAIN NATRIURETIC PEPTIDE - Abnormal; Notable for the following components:   B Natriuretic Peptide 177.0 (*)    All other components within normal limits  D-DIMER, QUANTITATIVE (NOT AT [facility]) - Abnormal; Notable for the following components:   D-Dimer, Quant 5.68 (*)    All other components within normal limits  SARS CORONAVIRUS 2 BY RT PCR (HOSPITAL ORDER, [facility])  I-STAT BETA HCG BLOOD, ED ([facility], [facility], [facility] ONLY)  TYPE AND SCREEN  PREPARE RBC (CROSSMATCH)  TROPONIN I (HIGH SENSITIVITY)  TROPONIN I (HIGH SENSITIVITY)    EKG EKG Interpretation  Date/Time:  Friday May 07 2020 23:45:23 EDT Ventricular Rate:  73 PR Interval:    QRS Duration: 105 QT Interval:  398 QTC  Calculation: 439 R Axis:   71 Text Interpretation: Sinus  rhythm Normal ECG When compared with ECG of 04/24/2020, No significant change was found Confirmed by Delora Fuel ([id number]) on 05/07/2020 11:55:27 PM   Radiology DG CHEST PORT 1 VIEW  Result Date: 05/06/2020 CLINICAL DATA:  Pneumonia EXAM: PORTABLE CHEST 1 VIEW COMPARISON:  04/26/2020 FINDINGS: Shallow inspiration. Cardiac enlargement. Probable small left pleural effusion. No definite edema or consolidation. Mild improvement since prior study. IMPRESSION: Cardiac enlargement with small left pleural effusion. Electronically Signed   By: Lucienne Capers M.D.   On: 05/06/2020 06:08    Procedures Procedures   Medications Ordered in ED Medications  0.9 %  sodium chloride infusion (0 mL/hr Intravenous Hold 05/08/20 0349)  morphine 4 MG/ML injection 4 mg (4 mg Intravenous Given 05/08/20 0048)  morphine 4 MG/ML injection 4 mg (4 mg Intravenous Given 05/08/20 6568)    ED Course  I have reviewed the triage vital signs and the nursing notes.  Pertinent labs & imaging results that were available during my care of the patient were reviewed by me and considered in my medical decision making (see chart for details).  MDM Rules/Calculators/A&P Chest pain of uncertain cause.  Anasarca.  I suspect that this is more related to anxiety and than intrinsic cardiac disease.  She is relatively immobile, but had been on prophylaxis for DVT.  Nevertheless, will screen for pulmonary embolism with D-dimer.  Old records reviewed confirming recent hospitalization.  I do not see any diagnosis of cirrhosis, transaminases had been normal.  She does have a very low albumin which is contributing to her edema.  Echocardiogram on 8/29 showed normal ejection fraction with indeterminate diastolic parameters.  ECG is normal.  Chest x-ray is essentially unchanged from recent chest x-ray.  Labs are significant for hemoglobin of 6.0 which is a 1 g drop from 24 hours earlier.   Renal insufficiency is present not significantly changed from baseline.  Mild hyponatremia is noted and not felt to be clinically significant.  BNP is only modestly elevated.  Troponin is normal x2 which essentially rules out myocardial infarction.  Pain is adequately controlled with morphine.  D-dimer is significantly elevated.  However, with dropping hemoglobin and renal insufficiency I am not going to pursue CT angiogram at this moment.  If hemoglobin stabilizes, may need to consider VQ scan to look for pulmonary embolism.  Case is discussed with Dr. Myna Hidalgo of [facility], who agrees to admit the patient.  Final Clinical Impression(s) / ED Diagnoses Final diagnoses:  None    Rx / DC Orders ED Discharge Orders    None       Delora Fuel, MD 12/75/17 0403

## 2020-05-07 NOTE — TOC Progression Note (Signed)
Transition of Care [facility]) - Progression Note    Patient Details  Name: Marissa Wilkerson MRN: [id number] Date of Birth: 04/30/1961  [facility] of [facility]) CM/SW Contact  Purcell Mouton, RN Phone Number: 05/07/2020, 1:30 PM  Clinical Narrative:    Corey Harold was called for transportation.   Expected Discharge Plan: Skilled Nursing Facility Barriers to Discharge: Ship broker, Continued Medical Work up  Expected Discharge Plan and Services Expected Discharge Plan: [place] Choice: [place] arrangements for the past 2 months: Single Family Home Expected Discharge Date: 05/07/20                 DME Agency: NA       HH Arranged: NA           Social Determinants of Health (SDOH) Interventions    Readmission Risk Interventions Readmission Risk Prevention Plan 04/29/2020  Transportation Screening Complete  PCP or Specialist Appt within 3-5 Days Complete  HRI or [place] Complete  Social Work Consult for [place] Planning/Counseling Complete  Palliative Care Screening Complete  Medication Review Press photographer) Referral to Pharmacy  Some recent data might be hidden

## 2020-05-07 NOTE — ED Triage Notes (Signed)
Pt comes via [facility] from [facility] for sudden onset of CP about a hour and a half ago, pressure, with SOB, non radiating, hx of CHF, pt has pitting edema in bilateral lower extremities and abdomen. Pt is on 4L o2 that she normally wears. PTA pt received 4 nitro and 324 ASA with some relief.

## 2020-05-07 NOTE — TOC Progression Note (Addendum)
Transition of Care [facility]) - Progression Note    Patient Details  Name: Marissa Wilkerson MRN: [id number] Date of Birth: June 17, 1961  Transition of Care [facility]) CM/SW Contact  Ross Ludwig, Standard Phone Number: 05/07/2020, 9:02 AM  Clinical Narrative:    Patient plans to go to [facility] once medically ready.  Insurance authorization has been approved [id number] good for 5 days next review 05-10-20.  Reference number is [id number].   Expected Discharge Plan: Skilled Nursing Facility Barriers to Discharge: Ship broker, Continued Medical Work up  Expected Discharge Plan and Services Expected Discharge Plan: [place] Choice: [place] arrangements for the past 2 months: Single Family Home                   DME Agency: NA       HH Arranged: NA           Social Determinants of Health (SDOH) Interventions    Readmission Risk Interventions Readmission Risk Prevention Plan 04/29/2020  Transportation Screening Complete  PCP or Specialist Appt within 3-5 Days Complete  HRI or [place] Complete  Social Work Consult for [place] Planning/Counseling Complete  Palliative Care Screening Complete  Medication Review Press photographer) Referral to Pharmacy  Some recent data might be hidden

## 2020-05-08 ENCOUNTER — Emergency Department (HOSPITAL_COMMUNITY): Payer: Medicare Other

## 2020-05-08 ENCOUNTER — Observation Stay (HOSPITAL_COMMUNITY): Payer: Medicare Other

## 2020-05-08 ENCOUNTER — Encounter (HOSPITAL_COMMUNITY): Payer: Self-pay | Admitting: Family Medicine

## 2020-05-08 ENCOUNTER — Other Ambulatory Visit: Payer: Self-pay

## 2020-05-08 DIAGNOSIS — R7989 Other specified abnormal findings of blood chemistry: Secondary | ICD-10-CM

## 2020-05-08 DIAGNOSIS — R0789 Other chest pain: Secondary | ICD-10-CM | POA: Diagnosis present

## 2020-05-08 DIAGNOSIS — I5032 Chronic diastolic (congestive) heart failure: Secondary | ICD-10-CM | POA: Diagnosis present

## 2020-05-08 DIAGNOSIS — J9611 Chronic respiratory failure with hypoxia: Secondary | ICD-10-CM | POA: Diagnosis present

## 2020-05-08 DIAGNOSIS — K219 Gastro-esophageal reflux disease without esophagitis: Secondary | ICD-10-CM | POA: Diagnosis present

## 2020-05-08 DIAGNOSIS — D649 Anemia, unspecified: Secondary | ICD-10-CM | POA: Diagnosis not present

## 2020-05-08 DIAGNOSIS — D631 Anemia in chronic kidney disease: Secondary | ICD-10-CM | POA: Diagnosis present

## 2020-05-08 DIAGNOSIS — M7989 Other specified soft tissue disorders: Secondary | ICD-10-CM

## 2020-05-08 DIAGNOSIS — F0390 Unspecified dementia without behavioral disturbance: Secondary | ICD-10-CM | POA: Diagnosis present

## 2020-05-08 DIAGNOSIS — E871 Hypo-osmolality and hyponatremia: Secondary | ICD-10-CM | POA: Diagnosis present

## 2020-05-08 DIAGNOSIS — R5381 Other malaise: Secondary | ICD-10-CM | POA: Diagnosis present

## 2020-05-08 DIAGNOSIS — Z9049 Acquired absence of other specified parts of digestive tract: Secondary | ICD-10-CM | POA: Diagnosis not present

## 2020-05-08 DIAGNOSIS — Z66 Do not resuscitate: Secondary | ICD-10-CM | POA: Diagnosis present

## 2020-05-08 DIAGNOSIS — Z87891 Personal history of nicotine dependence: Secondary | ICD-10-CM | POA: Diagnosis not present

## 2020-05-08 DIAGNOSIS — F329 Major depressive disorder, single episode, unspecified: Secondary | ICD-10-CM | POA: Diagnosis present

## 2020-05-08 DIAGNOSIS — N179 Acute kidney failure, unspecified: Secondary | ICD-10-CM

## 2020-05-08 DIAGNOSIS — K7469 Other cirrhosis of liver: Secondary | ICD-10-CM | POA: Diagnosis present

## 2020-05-08 DIAGNOSIS — E1122 Type 2 diabetes mellitus with diabetic chronic kidney disease: Secondary | ICD-10-CM | POA: Diagnosis present

## 2020-05-08 DIAGNOSIS — Z86718 Personal history of other venous thrombosis and embolism: Secondary | ICD-10-CM | POA: Diagnosis not present

## 2020-05-08 DIAGNOSIS — E785 Hyperlipidemia, unspecified: Secondary | ICD-10-CM | POA: Diagnosis present

## 2020-05-08 DIAGNOSIS — D860 Sarcoidosis of lung: Secondary | ICD-10-CM

## 2020-05-08 DIAGNOSIS — G40909 Epilepsy, unspecified, not intractable, without status epilepticus: Secondary | ICD-10-CM | POA: Diagnosis present

## 2020-05-08 DIAGNOSIS — Z794 Long term (current) use of insulin: Secondary | ICD-10-CM | POA: Diagnosis not present

## 2020-05-08 DIAGNOSIS — Z87898 Personal history of other specified conditions: Secondary | ICD-10-CM

## 2020-05-08 DIAGNOSIS — I130 Hypertensive heart and chronic kidney disease with heart failure and stage 1 through stage 4 chronic kidney disease, or unspecified chronic kidney disease: Secondary | ICD-10-CM | POA: Diagnosis present

## 2020-05-08 DIAGNOSIS — Z20822 Contact with and (suspected) exposure to covid-19: Secondary | ICD-10-CM | POA: Diagnosis present

## 2020-05-08 DIAGNOSIS — N1831 Chronic kidney disease, stage 3a: Secondary | ICD-10-CM

## 2020-05-08 LAB — COMPREHENSIVE METABOLIC PANEL
ALT: 12 U/L (ref 0–44)
AST: 15 U/L (ref 15–41)
Albumin: 1.5 g/dL — ABNORMAL LOW (ref 3.5–5.0)
Alkaline Phosphatase: 61 U/L (ref 38–126)
Anion gap: 9 (ref 5–15)
BUN: 36 mg/dL — ABNORMAL HIGH (ref 6–20)
CO2: 31 mmol/L (ref 22–32)
Calcium: 7.9 mg/dL — ABNORMAL LOW (ref 8.9–10.3)
Chloride: 91 mmol/L — ABNORMAL LOW (ref 98–111)
Creatinine, Ser: 1.57 mg/dL — ABNORMAL HIGH (ref 0.44–1.00)
GFR calc Af Amer: 42 mL/min — ABNORMAL LOW (ref >60)
GFR calc non Af Amer: 36 mL/min — ABNORMAL LOW (ref >60)
Glucose, Bld: 130 mg/dL — ABNORMAL HIGH (ref 70–99)
Potassium: 4.4 mmol/L (ref 3.5–5.1)
Sodium: 131 mmol/L — ABNORMAL LOW (ref 135–145)
Total Bilirubin: 0.4 mg/dL (ref 0.3–1.2)
Total Protein: 5.5 g/dL — ABNORMAL LOW (ref 6.5–8.1)

## 2020-05-08 LAB — CBC WITH DIFFERENTIAL/PLATELET
Abs Immature Granulocytes: 0.1 10*3/uL — ABNORMAL HIGH (ref 0.00–0.07)
Basophils Absolute: 0 10*3/uL (ref 0.0–0.1)
Basophils Relative: 0 %
Eosinophils Absolute: 0.1 10*3/uL (ref 0.0–0.5)
Eosinophils Relative: 1 %
HCT: 19.7 % — ABNORMAL LOW (ref 36.0–46.0)
Hemoglobin: 6 g/dL — CL (ref 12.0–15.0)
Immature Granulocytes: 1 %
Lymphocytes Relative: 8 %
Lymphs Abs: 0.6 10*3/uL — ABNORMAL LOW (ref 0.7–4.0)
MCH: 29.4 pg (ref 26.0–34.0)
MCHC: 30.5 g/dL (ref 30.0–36.0)
MCV: 96.6 fL (ref 80.0–100.0)
Monocytes Absolute: 1 10*3/uL (ref 0.1–1.0)
Monocytes Relative: 12 %
Neutro Abs: 6.3 10*3/uL (ref 1.7–7.7)
Neutrophils Relative %: 78 %
Platelets: 155 10*3/uL (ref 150–400)
RBC: 2.04 MIL/uL — ABNORMAL LOW (ref 3.87–5.11)
RDW: 13.6 % (ref 11.5–15.5)
WBC: 8 10*3/uL (ref 4.0–10.5)
nRBC: 0 % (ref 0.0–0.2)

## 2020-05-08 LAB — RETICULOCYTES
Immature Retic Fract: 13.2 % (ref 2.3–15.9)
RBC.: 2.98 MIL/uL — ABNORMAL LOW (ref 3.87–5.11)
Retic Count, Absolute: 115.6 10*3/uL (ref 19.0–186.0)
Retic Ct Pct: 3.9 % — ABNORMAL HIGH (ref 0.4–3.1)

## 2020-05-08 LAB — CBG MONITORING, ED
Glucose-Capillary: 94 mg/dL (ref 70–99)
Glucose-Capillary: 97 mg/dL (ref 70–99)
Glucose-Capillary: 118 mg/dL — ABNORMAL HIGH (ref 70–99)

## 2020-05-08 LAB — TROPONIN I (HIGH SENSITIVITY)
Troponin I (High Sensitivity): 7 ng/L (ref <18)
Troponin I (High Sensitivity): 10 ng/L (ref <18)

## 2020-05-08 LAB — HEMOGLOBIN AND HEMATOCRIT, BLOOD
HCT: 28.6 % — ABNORMAL LOW (ref 36.0–46.0)
Hemoglobin: 8.8 g/dL — ABNORMAL LOW (ref 12.0–15.0)

## 2020-05-08 LAB — PREPARE RBC (CROSSMATCH)

## 2020-05-08 LAB — SARS CORONAVIRUS 2 BY RT PCR (HOSPITAL ORDER, PERFORMED IN ~~LOC~~ HOSPITAL LAB): SARS Coronavirus 2: NEGATIVE

## 2020-05-08 LAB — D-DIMER, QUANTITATIVE: D-Dimer, Quant: 5.68 ug/mL-FEU — ABNORMAL HIGH (ref 0.00–0.50)

## 2020-05-08 LAB — GLUCOSE, CAPILLARY
Glucose-Capillary: 118 mg/dL — ABNORMAL HIGH (ref 70–99)
Glucose-Capillary: 155 mg/dL — ABNORMAL HIGH (ref 70–99)

## 2020-05-08 LAB — BRAIN NATRIURETIC PEPTIDE: B Natriuretic Peptide: 177 pg/mL — ABNORMAL HIGH (ref 0.0–100.0)

## 2020-05-08 LAB — OCCULT BLOOD X 1 CARD TO LAB, STOOL: Fecal Occult Bld: NEGATIVE

## 2020-05-08 MED ORDER — SODIUM CHLORIDE 0.9% FLUSH: 3.0000 mL | INTRAVENOUS | Status: DC | PRN

## 2020-05-08 MED ORDER — SPIRONOLACTONE 25 MG PO TABS
50.0000 mg | ORAL_TABLET | Freq: Every day | ORAL | Status: DC
  Administered 2020-05-08 – 2020-05-09 (×2): 50 mg via ORAL
  Filled 2020-05-08 (×2): qty 2

## 2020-05-08 MED ORDER — ALBUTEROL SULFATE (2.5 MG/3ML) 0.083% IN NEBU: 2.5000 mg | INHALATION_SOLUTION | Freq: Four times a day (QID) | RESPIRATORY_TRACT | Status: DC | PRN

## 2020-05-08 MED ORDER — TECHNETIUM TO 99M ALBUMIN AGGREGATED
4.3000 | Freq: Once | INTRAVENOUS | Status: AC | PRN
  Administered 2020-05-08: 4.3 via INTRAVENOUS

## 2020-05-08 MED ORDER — ATORVASTATIN CALCIUM 10 MG PO TABS
20.0000 mg | ORAL_TABLET | Freq: Every day | ORAL | Status: DC
  Administered 2020-05-08 – 2020-05-09 (×2): 20 mg via ORAL
  Filled 2020-05-08 (×2): qty 2

## 2020-05-08 MED ORDER — FLUOXETINE HCL 10 MG PO CAPS
10.0000 mg | ORAL_CAPSULE | Freq: Every day | ORAL | Status: DC
  Administered 2020-05-08 – 2020-05-09 (×2): 10 mg via ORAL
  Filled 2020-05-08 (×3): qty 1

## 2020-05-08 MED ORDER — MORPHINE SULFATE (PF) 4 MG/ML IV SOLN: 3.0000 mg | INTRAVENOUS | Status: DC | PRN

## 2020-05-08 MED ORDER — HYDROCORTISONE (PERIANAL) 2.5 % EX CREA
TOPICAL_CREAM | Freq: Two times a day (BID) | CUTANEOUS | Status: DC
  Filled 2020-05-08: qty 28.35

## 2020-05-08 MED ORDER — SODIUM CHLORIDE 0.9 % IV SOLN: 250.0000 mL | INTRAVENOUS | Status: DC | PRN

## 2020-05-08 MED ORDER — SODIUM CHLORIDE 0.9% FLUSH
3.0000 mL | Freq: Two times a day (BID) | INTRAVENOUS | Status: DC
  Administered 2020-05-08 (×2): 3 mL via INTRAVENOUS

## 2020-05-08 MED ORDER — ACETAMINOPHEN 650 MG RE SUPP: 650.0000 mg | Freq: Four times a day (QID) | RECTAL | Status: DC | PRN

## 2020-05-08 MED ORDER — MORPHINE SULFATE (PF) 4 MG/ML IV SOLN
4.0000 mg | Freq: Once | INTRAVENOUS | Status: AC
  Administered 2020-05-08: 4 mg via INTRAVENOUS
  Filled 2020-05-08: qty 1

## 2020-05-08 MED ORDER — LACOSAMIDE 200 MG PO TABS
200.0000 mg | ORAL_TABLET | Freq: Two times a day (BID) | ORAL | Status: DC
  Administered 2020-05-08 – 2020-05-09 (×3): 200 mg via ORAL
  Filled 2020-05-08: qty 4
  Filled 2020-05-08 (×2): qty 1

## 2020-05-08 MED ORDER — INSULIN ASPART 100 UNIT/ML ~~LOC~~ SOLN: 0.0000 [IU] | Freq: Three times a day (TID) | SUBCUTANEOUS | Status: DC

## 2020-05-08 MED ORDER — HYDROCODONE-ACETAMINOPHEN 10-325 MG PO TABS
1.0000 | ORAL_TABLET | Freq: Four times a day (QID) | ORAL | Status: DC | PRN
  Administered 2020-05-08 (×2): 1 via ORAL
  Filled 2020-05-08 (×2): qty 1

## 2020-05-08 MED ORDER — OXYCODONE HCL 5 MG PO TABS: 5.0000 mg | ORAL_TABLET | Freq: Four times a day (QID) | ORAL | Status: DC | PRN

## 2020-05-08 MED ORDER — ONDANSETRON HCL 4 MG/2ML IJ SOLN: 4.0000 mg | Freq: Four times a day (QID) | INTRAMUSCULAR | Status: DC | PRN

## 2020-05-08 MED ORDER — SODIUM CHLORIDE 0.9% FLUSH
3.0000 mL | Freq: Two times a day (BID) | INTRAVENOUS | Status: DC
  Administered 2020-05-08 – 2020-05-09 (×2): 3 mL via INTRAVENOUS

## 2020-05-08 MED ORDER — TORSEMIDE 100 MG PO TABS
100.0000 mg | ORAL_TABLET | Freq: Every day | ORAL | Status: DC
  Administered 2020-05-08 – 2020-05-09 (×2): 100 mg via ORAL
  Filled 2020-05-08: qty 5
  Filled 2020-05-08: qty 1

## 2020-05-08 MED ORDER — ONDANSETRON HCL 4 MG PO TABS: 4.0000 mg | ORAL_TABLET | Freq: Four times a day (QID) | ORAL | Status: DC | PRN

## 2020-05-08 MED ORDER — DONEPEZIL HCL 10 MG PO TABS
10.0000 mg | ORAL_TABLET | Freq: Every day | ORAL | Status: DC
  Administered 2020-05-08: 10 mg via ORAL
  Filled 2020-05-08 (×2): qty 1

## 2020-05-08 MED ORDER — ACETAMINOPHEN 325 MG PO TABS: 650.0000 mg | ORAL_TABLET | Freq: Four times a day (QID) | ORAL | Status: DC | PRN

## 2020-05-08 MED ORDER — TORSEMIDE 20 MG PO TABS: 100.0000 mg | ORAL_TABLET | Freq: Every day | ORAL | Status: DC | PRN

## 2020-05-08 MED ORDER — INSULIN ASPART 100 UNIT/ML ~~LOC~~ SOLN: 0.0000 [IU] | SUBCUTANEOUS | Status: DC

## 2020-05-08 MED ORDER — CARVEDILOL 6.25 MG PO TABS
6.2500 mg | ORAL_TABLET | Freq: Two times a day (BID) | ORAL | Status: DC
  Administered 2020-05-08 – 2020-05-09 (×3): 6.25 mg via ORAL
  Filled 2020-05-08: qty 2
  Filled 2020-05-08 (×2): qty 1

## 2020-05-08 MED ORDER — SODIUM CHLORIDE 0.9 % IV SOLN: 10.0000 mL/h | Freq: Once | INTRAVENOUS | Status: DC

## 2020-05-08 NOTE — ED Notes (Signed)
Called [facility] tech, to let her know pt would be ready for scan soon as transfusion will be complete.

## 2020-05-08 NOTE — ED Notes (Signed)
Report given to 5c

## 2020-05-08 NOTE — Progress Notes (Addendum)
  PROGRESS NOTE  Patient admitted earlier this morning. See H&P.   Marissa Wilkerson is a 59 y.o. female with medical history significant for type 2 diabetes mellitus, anemia, chronic kidney disease stage IIIa, pulmonary sarcoidosis, chronic hypoxic respiratory failure on 4 L of nasal cannula O2 at baseline, recent imaging concerning for cirrhosis, seizure disorder, history of left lower extremity DVT, and recent admission for acute on chronic diastolic CHF during which she was diuresed 5.1 L and discharged to SNF 9/10 but experienced acute onset chest pain shortly after arrival there and return to the ED. Patient reports that she was upset about being discharged to a nursing facility, was angry at her husband for not agreeing to pick her up and take her home, and then began to develop a chest pain in the central and left chest.  Symptoms were constant, severe, without any alleviating or exacerbating factors identified, and associated with some nausea without vomiting and shortness of breath but no cough, no diaphoresis. Reports that she had a clot in her left leg many years ago, is unsure of what provoked this, and states that she was on anticoagulation only briefly.  She is seen and examined in the emergency department with husband at bedside.  She states that chest pain is pressure in nature, intermittent, and comes and goes randomly.  Also admits to some shortness of breath. She appears to be grossly fluid overloaded, although no sign of cardiopulmonary disease on CXR seen.   A/P:  Chest pain -Troponin 10 -Check VQ scan due to elevated D-dimer 5.68, cannot obtain CTA chest due to poor renal function.  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 PT OT TOC consult.      Dessa Phi, DO [facility] 05/08/2020, 11:51 AM  Available via Epic secure chat 7am-7pm After these hours, please refer to coverage provider listed on [email or web address]

## 2020-05-08 NOTE — Progress Notes (Signed)
Lower extremity venous bilateral study completed.   Please see CV Proc for preliminary results.   Marissa Wilkerson  

## 2020-05-08 NOTE — H&P (Signed)
History and Physical    Marissa Wilkerson:[id number] DOB: Jun 03, 1961 DOA: 05/07/2020  PCP: Jani Gravel, MD   Patient coming from: [facility]   Chief Complaint: Chest pain   HPI: Marissa Wilkerson is a 59 y.o. female with medical history significant for type 2 diabetes mellitus, anemia, chronic kidney disease stage IIIa, pulmonary sarcoidosis, chronic hypoxic respiratory failure, recent imaging concerning for cirrhosis, seizure disorder, history of left lower extremity DVT, and recent admission for acute on chronic diastolic CHF during which she was diuresed 5.1 L and discharged to SNF yesterday but experienced acute onset chest pain shortly after arrival there and return to the ED. Patient reports that she was upset about being discharged to a nursing facility, was angry at her husband for not agreeing to pick her up and take her home, and then began to develop a double pain in the central and left chest.  Symptoms were constant, severe, without any alleviating or exacerbating factors identified, and associated with some nausea without vomiting and shortness of breath but no cough, no diaphoresis.  She denies melena or hematochezia.  Denies fevers or chills.  Reports that she had a clot in her left leg many years ago, is unsure of what provoked this, and states that she was on anticoagulation only briefly.  ED Course: Upon arrival to the ED, patient is found to be afebrile, saturating well, and with stable blood pressure.  EKG features a sinus rhythm and chest x-ray notable for cardiac enlargement but no acute pulmonary disease.  Chemistry panel features a sodium 131, albumin 1.5, BUN 36, and creatinine 1.57, up from an apparent baseline of 1.1-1.2.  CBC notable for a normocytic anemia with hemoglobin of 6.0, down from 7.0 yesterday morning and down from 9.5 two weeks ago.  High-sensitivity troponin was normal x2 and BNP slightly elevated.  2 units of red blood cells were ordered and IV morphine administered  in the ED.  COVID-19 screening test not yet resulted.  Review of Systems:  All other systems reviewed and apart from HPI, are negative.  Past Medical History:  Diagnosis Date  . Anemia    05/04/15 hemglobin 8.8  . Arthritis   . Diabetes mellitus without complication (HCC)    insulin dependent, po meds  . Diastolic dysfunction    followed by dr polite found on echo 06-12-13 , grade 1 per pt  . Dyslipidemia   . Edema of extremities    PITTING EDEMA     . GERD (gastroesophageal reflux disease)   . Hypertension   . Sarcoidosis of lung [facility])    pulmonologist Dr.Byrum, [id number]   . Shortness of breath    ON EXERTION , O2 2-3 l/min  . 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 Vertigo, benign positional   . Wheezing    OCC    Past Surgical History:  Procedure Laterality Date  . all teeth extraction  7-8 yrs ago  . CHOLECYSTECTOMY  1996  . COLONOSCOPY WITH PROPOFOL N/A 08/05/2013   Procedure: COLONOSCOPY WITH PROPOFOL;  Surgeon: Garlan Fair, MD;  Location: [facility];  Service: Endoscopy;  Laterality: N/A;  . ESOPHAGOGASTRODUODENOSCOPY (EGD) WITH PROPOFOL N/A 05/31/2015   Procedure: ESOPHAGOGASTRODUODENOSCOPY (EGD) WITH PROPOFOL;  Surgeon: Garlan Fair, MD;  Location: [facility];  Service: Endoscopy;  Laterality: N/A;  . LUNG BIOPSY Left 03/05/2014   Procedure: LUNG BIOPSY;  Surgeon: Gaye Pollack, MD;  Location: [facility];  Service:  Thoracic;  Laterality: Left;  [name] VIDEO ASSISTED THORACOSCOPY Left 03/05/2014   Procedure: VIDEO ASSISTED THORACOSCOPY;  Surgeon: Gaye Pollack, MD;  Location: [place];  Service: Thoracic;  Laterality: Left;  [name] VIDEO BRONCHOSCOPY Bilateral 09/04/2013   Procedure: VIDEO BRONCHOSCOPY WITH FLUORO;  Surgeon: Collene Gobble, MD;  Location: [facility];  Service: Cardiopulmonary;  Laterality: Bilateral;  . WISDOM TOOTH EXTRACTION  yrs ago    Social History:   reports that she quit smoking about 7 years ago. Her smoking use  included cigarettes. She has a 12.50 pack-year smoking history. She has never used smokeless tobacco. She reports current alcohol use. She reports that she does not use drugs.  Allergies  Allergen Reactions  . Cymbalta [Duloxetine Hcl] Other (See Comments)    Hallucinations   . Fentanyl Other (See Comments)    Patches. Blistered skin.   . Metformin And Related Nausea And Vomiting  . Mobic [Meloxicam] Nausea And Vomiting  . Sulfa Antibiotics Rash  . Voltaren [Diclofenac] Nausea And Vomiting    Family History  Problem Relation Age of Onset  . Heart failure Mother   . Diabetes Father   . Hypertension Father   . Diabetes Brother   . Hypertension Brother      Prior to Admission medications   Medication Sig Start Date End Date Taking? Authorizing Provider  acetaminophen (TYLENOL) 325 MG tablet Take 2 tablets (650 mg total) by mouth every 6 (six) hours as needed for mild pain (or Fever >/= 101). 05/07/20  Yes Nita Sells, MD  albuterol (PROVENTIL HFA;VENTOLIN HFA) 108 (90 Base) MCG/ACT inhaler Inhale 2 puffs into the lungs every 6 (six) hours as needed for wheezing or shortness of breath. 11/02/17  Yes Collene Gobble, MD  atorvastatin (LIPITOR) 20 MG tablet Take 1 tablet (20 mg total) by mouth daily. 09/28/17  Yes Georgette Shell, MD  carvedilol (COREG) 6.25 MG tablet Take 6.25 mg by mouth 2 (two) times daily. 04/22/20  Yes [provider]  donepezil (ARICEPT) 10 MG tablet Take 10 mg by mouth at bedtime.  11/24/19  Yes [provider]  FLUoxetine (PROZAC) 10 MG capsule Take 1 capsule (10 mg total) by mouth daily. 05/07/20  Yes Nita Sells, MD  folic acid (FOLVITE) 1 MG tablet Take 1 tablet (1 mg total) by mouth daily. 01/09/20 01/08/21 Yes Florencia Reasons, MD  HYDROcodone-acetaminophen [facility]) 10-325 MG tablet Take 1 tablet by mouth every 6 (six) hours as needed for moderate pain or severe pain. 05/07/20  Yes Nita Sells, MD  hydrocortisone (ANUSOL-HC) 2.5  % rectal cream Place rectally 2 (two) times daily. 05/07/20  Yes Nita Sells, MD  insulin lispro (HUMALOG) 100 UNIT/ML injection Before each meal 3 times a day, 140-199 - 2 units, 200-250 - 4 units, 251-299 - 6 units,  300-349 - 8 units,  350 or above 10 units. Insulin PEN if approved, provide syringes and needles if needed. Patient taking differently: Inject 2-10 Units into the skin See admin instructions. Before each meal 3 times a day, 140-199 - 2 units, 200-250 - 4 units, 251-299 - 6 units,  300-349 - 8 units,  350 or above 10 units. Insulin PEN if approved, provide syringes and needles if needed. 01/09/20  Yes Florencia Reasons, MD  lacosamide (VIMPAT) 200 MG TABS tablet Take 1 tablet (200 mg total) by mouth 2 (two) times daily. 05/07/20  Yes Nita Sells, MD  metoCLOPramide (REGLAN) 5 MG tablet Take 5-10 mg by mouth See admin instructions. Take  2 tablets (10 mg) in the morning and Take 1 tablet (5 mg) in the evening   Yes [provider]  spironolactone (ALDACTONE) 100 MG tablet Take 0.5 tablets (50 mg total) by mouth daily. 05/07/20  Yes Nita Sells, MD  torsemide (DEMADEX) 100 MG tablet Take 100 mg by mouth daily as needed (fluid).    Yes [provider]  vitamin B-12 (CYANOCOBALAMIN) 1000 MCG tablet Take 1 tablet (1,000 mcg total) by mouth daily. 01/09/20  Yes Florencia Reasons, MD    Physical Exam: Vitals:   05/08/20 0200 05/08/20 0230 05/08/20 0300 05/08/20 0330  BP: (!) 164/63 (!) 147/71 (!) 161/65 (!) 152/74  Pulse: 73 66 73 69  Resp: 17 13 14 13   Temp:      TempSrc:      SpO2: 100% 100% 100% 98%    Constitutional: NAD, calm  Eyes: PERTLA, lids and conjunctivae normal ENMT: Mucous membranes are moist. Posterior pharynx clear of any exudate or lesions.   Neck: normal, supple, no masses, no thyromegaly Respiratory:  no wheezing, no crackles. No accessory muscle use.  Cardiovascular: S1 & S2 heard, regular rate and rhythm. Pitting edema involving bilateral  LEs. Abdomen: No distension, no tenderness, soft. Bowel sounds active.  Musculoskeletal: no clubbing / cyanosis. No joint deformity upper and lower extremities.   Skin: no significant rashes, lesions, ulcers. Warm, dry, well-perfused. Neurologic: CN 2-12 grossly intact. Sensation intact. Moving all extremities.  Psychiatric: Alert and oriented to person, place, and situation. Calm and cooperative.    Labs and Imaging on Admission: I have personally reviewed following labs and imaging studies  CBC: Recent Labs  Lab 05/01/20 0519 05/02/20 0422 05/07/20 0454 05/07/20 2353  WBC 8.1 8.6 8.2 8.0  NEUTROABS 6.1 6.4 6.1 6.3  HGB 7.7* 8.1* 7.0* 6.0*  HCT 24.7* 25.5* 22.7* 19.7*  MCV 96.5 96.6 96.6 96.6  PLT 160 149* 143* 578   Basic Metabolic Panel: Recent Labs  Lab 05/01/20 0519 05/01/20 0519 05/02/20 0422 05/03/20 0438 05/04/20 0458 05/05/20 0508 05/06/20 0459 05/07/20 0454 05/07/20 2353  NA 135   < > 137   < > 131* 134* 130* 134* 131*  K 4.7   < > 5.5*   < > 4.6 4.5 4.1 4.7 4.4  CL 97*   < > 98   < > 94* 94* 94* 93* 91*  CO2 32   < > 32   < > 31 32 32 31 31  GLUCOSE 100*   < > 93   < > 94 120* 105* 100* 130*  BUN 28*   < > 30*   < > 31* 34* 34* 37* 36*  CREATININE 1.30*   < > 1.32*   < > 1.25* 1.45* 1.45* 1.49* 1.57*  CALCIUM 7.8*   < > 8.1*   < > 7.7* 8.0* 7.7* 8.0* 7.9*  MG 1.6*  --  2.1  --  1.9  --   --  1.8  --    < > = values in this interval not displayed.   GFR: Estimated Creatinine Clearance: 47.2 mL/min (A) (by C-G formula based on SCr of 1.57 mg/dL (H)). Liver Function Tests: Recent Labs  Lab 05/01/20 0519 05/02/20 0422 05/06/20 0459 05/07/20 0454 05/07/20 2353  AST 14* 13* 13* 14* 15  ALT 9 11 10 10 12   ALKPHOS 53 59 55 54 61  BILITOT 0.3 0.6 0.3 0.5 0.4  PROT 5.5* 5.6* 5.5* 5.8* 5.5*  ALBUMIN 1.7* 1.8* 1.7* 1.8* 1.5*  No results for input(s): LIPASE, AMYLASE in the last 168 hours. No results for input(s): AMMONIA in the last 168  hours. Coagulation Profile: No results for input(s): INR, PROTIME in the last 168 hours. Cardiac Enzymes: No results for input(s): CKTOTAL, CKMB, CKMBINDEX, TROPONINI in the last 168 hours. BNP (last 3 results) No results for input(s): PROBNP in the last 8760 hours. HbA1C: No results for input(s): HGBA1C in the last 72 hours. CBG: Recent Labs  Lab 05/06/20 1155 05/06/20 1716 05/06/20 2032 05/07/20 0754 05/07/20 1230  GLUCAP 140* 158* 155* 102* 121*   Lipid Profile: No results for input(s): CHOL, HDL, LDLCALC, TRIG, CHOLHDL, LDLDIRECT in the last 72 hours. Thyroid Function Tests: No results for input(s): TSH, T4TOTAL, FREET4, T3FREE, THYROIDAB in the last 72 hours. Anemia Panel: No results for input(s): VITAMINB12, FOLATE, FERRITIN, TIBC, IRON, RETICCTPCT in the last 72 hours. Urine analysis:    Component Value Date/Time   COLORURINE AMBER (A) 04/24/2020 1407   APPEARANCEUR CLOUDY (A) 04/24/2020 1407   LABSPEC 1.017 04/24/2020 1407   PHURINE 5.0 04/24/2020 1407   GLUCOSEU NEGATIVE 04/24/2020 1407   HGBUR MODERATE (A) 04/24/2020 1407   BILIRUBINUR NEGATIVE 04/24/2020 1407   KETONESUR NEGATIVE 04/24/2020 1407   PROTEINUR >=300 (A) 04/24/2020 1407   UROBILINOGEN 1.0 03/03/2014 0942   NITRITE NEGATIVE 04/24/2020 1407   LEUKOCYTESUR MODERATE (A) 04/24/2020 1407   Sepsis Labs: @LABRCNTIP (procalcitonin:4,lacticidven:4) ) Recent Results (from the past 240 hour(s))  SARS Coronavirus 2 by RT PCR (hospital order, performed in [place] hospital lab) Nasopharyngeal Nasopharyngeal Swab     Status: None   Collection Time: 05/02/20  9:49 AM   Specimen: Nasopharyngeal Swab  Result Value Ref Range Status   SARS Coronavirus 2 NEGATIVE NEGATIVE Final    Comment: (NOTE) SARS-CoV-2 target nucleic acids are NOT DETECTED.  The SARS-CoV-2 RNA is generally detectable in upper and lower respiratory specimens during the acute phase of infection. The lowest concentration of SARS-CoV-2  viral copies this assay can detect is 250 copies / mL. A negative result does not preclude SARS-CoV-2 infection and should not be used as the sole basis for treatment or other patient management decisions.  A negative result may occur with improper specimen collection / handling, submission of specimen other than nasopharyngeal swab, presence of viral mutation(s) within the areas targeted by this assay, and inadequate number of viral copies (<250 copies / mL). A negative result must be combined with clinical observations, patient history, and epidemiological information.  Fact Sheet for Patients:   StrictlyIdeas.no  Fact Sheet for Healthcare Providers: BankingDealers.co.za  This test is not yet approved or  cleared by the [place] FDA and has been authorized for detection and/or diagnosis of SARS-CoV-2 by FDA under an Emergency Use Authorization (EUA).  This EUA will remain in effect (meaning this test can be used) for the duration of the COVID-19 declaration under Section 564(b)(1) of the Act, 21 U.S.C. section 360bbb-3(b)(1), unless the authorization is terminated or revoked sooner.  Performed at [facility], [place] [address] [place],  [address]   SARS Coronavirus 2 by RT PCR (hospital order, performed in [place] hospital lab)     Status: None   Collection Time: 05/07/20 10:10 AM  Result Value Ref Range Status   SARS Coronavirus 2 NEGATIVE NEGATIVE Final    Comment: (NOTE) SARS-CoV-2 target nucleic acids are NOT DETECTED.  The SARS-CoV-2 RNA is generally detectable in upper and lower respiratory specimens during the acute phase of infection. The  lowest concentration of SARS-CoV-2 viral copies this assay can detect is 250 copies / mL. A negative result does not preclude SARS-CoV-2 infection and should not be used as the sole basis for treatment or other patient management decisions.  A negative  result may occur with improper specimen collection / handling, submission of specimen other than nasopharyngeal swab, presence of viral mutation(s) within the areas targeted by this assay, and inadequate number of viral copies (<250 copies / mL). A negative result must be combined with clinical observations, patient history, and epidemiological information.  Fact Sheet for Patients:   StrictlyIdeas.no  Fact Sheet for Healthcare Providers: BankingDealers.co.za  This test is not yet approved or  cleared by the [place] FDA and has been authorized for detection and/or diagnosis of SARS-CoV-2 by FDA under an Emergency Use Authorization (EUA).  This EUA will remain in effect (meaning this test can be used) for the duration of the COVID-19 declaration under Section 564(b)(1) of the Act, 21 U.S.C. section 360bbb-3(b)(1), unless the authorization is terminated or revoked sooner.  Performed at [facility], [place] [address], [place], [place] [address]      Radiological Exams on Admission: DG Chest 2 View  Result Date: 05/08/2020 CLINICAL DATA:  Left-sided chest pain, shortness of breath, lightheadedness. EXAM: CHEST - 2 VIEW COMPARISON:  05/06/2020 FINDINGS: Shallow inspiration. Cardiac enlargement. No vascular congestion, edema, or consolidation. Atelectasis in the left base. No pleural effusions. No pneumothorax. Tortuous aorta. IMPRESSION: Cardiac enlargement. No evidence of active pulmonary disease. Electronically Signed   By: Lucienne Capers M.D.   On: 05/08/2020 00:19   DG CHEST PORT 1 VIEW  Result Date: 05/06/2020 CLINICAL DATA:  Pneumonia EXAM: PORTABLE CHEST 1 VIEW COMPARISON:  04/26/2020 FINDINGS: Shallow inspiration. Cardiac enlargement. Probable small left pleural effusion. No definite edema or consolidation. Mild improvement since prior study. IMPRESSION: Cardiac enlargement with small left pleural effusion.  Electronically Signed   By: Lucienne Capers M.D.   On: 05/06/2020 06:08    EKG: Independently reviewed. Sinus rhythm, rate 73, QTc 439 ms.   Assessment/Plan   1. Chest pain  - Presents with acute-onset of chest pain shortly after hospital discharge during which she was on prophylactic Lovenox, and is found to have normal HS troponins, no ischemic features on EKG, no acute findings on CXR, and d-dimer 5.68  - She was treated with ASA 324 and multiple doses NTG prior to arrival, did not feel that this helped, but pain has gradually improved  - Check LE venous US, repeat chem panel after transfusing and holding diuretics and consider CTA chest if GFR improved    2. Acute on chronic anemia  - Hgb is 6.0 on admission, down from 7.0 the morning before, had been trending down from 9.5 two weeks ago despite diuresing 5.1 liters  - She denies melena or hematochezia, denies any N/V since prior to the recent admission  - 2 units RBC ordered from ED  - Check anemia panel and FOBT, check post-transfusion H&H    3. Acute kidney injury superimposed on CKD IIIa  - SCr is 1.57 on admission, up from apparent baseline of 1.1-1.2  - Hold diuretics initially, renally-dose medications, repeat chem panel in am   4. 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 Chronic diastolic CHF  - She has bilateral leg swelling but clear lungs on  CXR and no respiratory sxs   - Hold diuretics initially as above, monitor wt and I/Os, continue Coreg    6. Type II DM  - A1c was only 4.6% two weeks ago  - Check CBGs and use a low-intensity SSI if needed   7. Seizures  - Continue Vimpat    8. Pulmonary sarcoidosis; chronic hypoxic respiratory failure  - Appears stable, continue supplemental O2    DVT prophylaxis: SCDs  Code Status: Full  Family Communication: Discussed with  patient  Disposition Plan:  Patient is from: SNF  Anticipated d/c is to: SNF  Anticipated d/c date is: 05/09/20 Patient currently: Pending blood transfusion, LE venous US, possible CTA chest or V/Q scan  Consults called: None  Admission status: Observation     Vianne Bulls, MD [facility]  05/08/2020, 4:26 AM

## 2020-05-09 LAB — BPAM RBC
Blood Product Expiration Date: 202110042359
Blood Product Expiration Date: 202110052359
ISSUE DATE / TIME: 202109110524
ISSUE DATE / TIME: 202109110826
Unit Type and Rh: 7300
Unit Type and Rh: 7300

## 2020-05-09 LAB — CBC
HCT: 26.7 % — ABNORMAL LOW (ref 36.0–46.0)
Hemoglobin: 8.3 g/dL — ABNORMAL LOW (ref 12.0–15.0)
MCH: 28.9 pg (ref 26.0–34.0)
MCHC: 31.1 g/dL (ref 30.0–36.0)
MCV: 93 fL (ref 80.0–100.0)
Platelets: 163 10*3/uL (ref 150–400)
RBC: 2.87 MIL/uL — ABNORMAL LOW (ref 3.87–5.11)
RDW: 14.2 % (ref 11.5–15.5)
WBC: 8.3 10*3/uL (ref 4.0–10.5)
nRBC: 0 % (ref 0.0–0.2)

## 2020-05-09 LAB — IRON AND TIBC
Iron: 28 ug/dL (ref 28–170)
Saturation Ratios: 16 % (ref 10.4–31.8)
TIBC: 179 ug/dL — ABNORMAL LOW (ref 250–450)
UIBC: 151 ug/dL

## 2020-05-09 LAB — BASIC METABOLIC PANEL WITH GFR
Anion gap: 6 (ref 5–15)
BUN: 32 mg/dL — ABNORMAL HIGH (ref 6–20)
CO2: 33 mmol/L — ABNORMAL HIGH (ref 22–32)
Calcium: 8.4 mg/dL — ABNORMAL LOW (ref 8.9–10.3)
Chloride: 94 mmol/L — ABNORMAL LOW (ref 98–111)
Creatinine, Ser: 1.36 mg/dL — ABNORMAL HIGH (ref 0.44–1.00)
GFR calc Af Amer: 50 mL/min — ABNORMAL LOW
GFR calc non Af Amer: 43 mL/min — ABNORMAL LOW
Glucose, Bld: 100 mg/dL — ABNORMAL HIGH (ref 70–99)
Potassium: 4.3 mmol/L (ref 3.5–5.1)
Sodium: 133 mmol/L — ABNORMAL LOW (ref 135–145)

## 2020-05-09 LAB — BASIC METABOLIC PANEL
Anion gap: 7 (ref 5–15)
BUN: 34 mg/dL — ABNORMAL HIGH (ref 6–20)
CO2: 33 mmol/L — ABNORMAL HIGH (ref 22–32)
Calcium: 8 mg/dL — ABNORMAL LOW (ref 8.9–10.3)
Chloride: 94 mmol/L — ABNORMAL LOW (ref 98–111)
Creatinine, Ser: 1.37 mg/dL — ABNORMAL HIGH (ref 0.44–1.00)
GFR calc Af Amer: 49 mL/min — ABNORMAL LOW (ref >60)
GFR calc non Af Amer: 43 mL/min — ABNORMAL LOW (ref >60)
Glucose, Bld: 98 mg/dL (ref 70–99)
Potassium: 4.4 mmol/L (ref 3.5–5.1)
Sodium: 134 mmol/L — ABNORMAL LOW (ref 135–145)

## 2020-05-09 LAB — TYPE AND SCREEN
ABO/RH(D): B POS
Antibody Screen: NEGATIVE
Unit division: 0
Unit division: 0

## 2020-05-09 LAB — GLUCOSE, CAPILLARY
Glucose-Capillary: 103 mg/dL — ABNORMAL HIGH (ref 70–99)
Glucose-Capillary: 130 mg/dL — ABNORMAL HIGH (ref 70–99)

## 2020-05-09 LAB — FOLATE: Folate: 5.6 ng/mL — ABNORMAL LOW (ref >5.9)

## 2020-05-09 LAB — VITAMIN B12: Vitamin B-12: 569 pg/mL (ref 180–914)

## 2020-05-09 LAB — FERRITIN: Ferritin: 230 ng/mL (ref 11–307)

## 2020-05-09 NOTE — TOC Transition Note (Addendum)
Transition of Care [facility]) - CM/SW Discharge Note   Patient Details  Name: Marissa Wilkerson MRN: [id number] Date of Birth: 01/27/1961  Transition of Care [facility]) CM/SW Contact:  Konrad Penta, RN Phone Number: 05/09/2020, 11:10 AM   Clinical Narrative:   Patient to dc home today with home health and DME as noted. Made [name] (spouse) aware that hoyer lift likely to be delivered to home on tomorrow per [employer]. [name] agreeable to this plan. [name] endorses [facility] will be needed for transport home. [facility] scheduled for 2 pm pick up per RN request.    Final next level of care: Home w Home Health Services Barriers to Discharge: No Barriers Identified   Patient Goals and CMS Choice Patient states their goals for this hospitalization and ongoing recovery are:: to return home CMS Medicare.gov Compare Post Acute Care list provided to:: Patient Represenative (must comment) [name] Choice offered to / list presented to : Spouse                  DME Arranged: Other see comment Harrel Lemon lift) DME Agency: [employer] Date DME Agency Contacted: 05/09/20 Time DME Agency Contacted: [phone] Representative spoke with at DME Agency: [place]: RN, PT, OT, Nurse's Aide [place] Agency: [place] Date [place]: 05/09/20 Time [place]: [phone] Representative spoke with at [place]: Malachy Mood       Readmission Risk Interventions Readmission Risk Prevention Plan 04/29/2020  Transportation Screening Complete  PCP or Specialist Appt within 3-5 Days Complete  HRI or [place] Complete  Social Work Consult for [place] Planning/Counseling Complete  Palliative Care Screening Complete  Medication Review Press photographer) Referral to Pharmacy  Some recent data might be hidden    Marthenia Rolling, MSN, RN,BSN Inpatient [facility] Case Manager [phone]

## 2020-05-09 NOTE — Progress Notes (Signed)
Pt discharge education went over at bedside  IV removed, catheter intact and telemetry removed, CCMD aware  Pt husband took home all belongings Report given to [facility] transporters and paperwork including DNR form  Pt transferred to stretcher on oxygen  Pt discharged with [facility]

## 2020-05-09 NOTE — Discharge Summary (Signed)
Physician Discharge Summary  HOLLE SPRICK ASN:[id number] DOB: Apr 15, 1961 DOA: 05/07/2020  PCP: Jani Gravel, MD  Admit date: 05/07/2020 Discharge date: 05/09/2020  Admitted From: SNF Disposition: Home with home health, patient declined SNF   Recommendations for Outpatient Follow-up:  1. Follow up with PCP in 1 week  Discharge Condition: Stable CODE STATUS: DNR  Diet recommendation:  Diet Orders (From admission, onward)    Start     Ordered   05/09/20 0000  Diet - low sodium heart healthy        05/09/20 1109   05/09/20 0000  Diet Carb Modified        05/09/20 1109   05/08/20 1216  Diet Heart Room service appropriate? Yes; Fluid consistency: Thin  Diet effective now       Question Answer Comment  Room service appropriate? Yes   Fluid consistency: Thin      05/08/20 1215         Brief/Interim Summary: Scot Dock Milleris a 59 y.o.femalewith medical history significant fortype 2 diabetes mellitus, anemia, chronic kidney disease stage IIIa, pulmonary sarcoidosis, chronic hypoxic respiratory failure on 4 L of nasal cannula O2 at baseline, recent imaging concerning for cirrhosis, seizure disorder, history of left lower extremity DVT, and recent admission for acute on chronic diastolic CHF during which she was diuresed 5.1 L and discharged to SNF 9/10 but experienced acute onset chest pain shortly after arrival there and return to the ED.Patient reports that she was upset about being discharged to a nursing facility, was angry at her husband for not agreeing to pick her up and take her home, and then began to develop a chest pain in the central and left chest. Symptoms were constant, severe, without any alleviating or exacerbating factors identified, and associated with some nausea without vomiting and shortness of breath but no cough, no diaphoresis.Reports that she had a clot in her left leg many years ago, is unsure of what provoked this, and states that she was on anticoagulation only  briefly.  She underwent work-up including venous Dopplers which were negative for DVT, VQ scan which was negative for PE.  Her chest pain and shortness of breath improved and she was on her normal baseline nasal cannula oxygen level.  She work with physical therapy who recommended SNF placement, which patient declined.  She and her husband felt that she could discharge home with home health services.   Discharge Diagnoses:  Principal Problem:   Symptomatic anemia Active Problems:   Chronic respiratory failure (HCC)   Sarcoidosis of lung (HCC)   Chronic diastolic CHF (congestive heart failure) (HCC)   Acute renal failure superimposed on stage 3a chronic kidney disease (HCC)   History of seizure   Atypical chest pain   Positive D-dimer   Hyponatremia   Other cirrhosis of liver (HCC)   Chest pain -Troponin 10 -VQ scan negative for PE -Lower extremity Dopplers negative for DVT -Could be secondary to symptomatic anemia, see below  Pulmonary sarcoidosis, chronic hypoxemic respiratory failure -Requires 4 L nasal cannula O2 at baseline -Stable  Symptomatic anemia Acute on chronic normocytic anemia, secondary to anemia of chronic disease -Baseline hemoglobin from previous hospitalization was 7-8  -No complaints of melena, hematochezia, nausea vomiting -FOBT negative -Status post 2 unit packed red blood cells  -Hemoglobin improved  AKI on CKD stage IIIa -Baseline creatinine 1.4  -Stable   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  Will discharge  home with home health services.   Discharge Instructions  Discharge Instructions    Call MD for:  difficulty breathing, headache or visual disturbances   Complete by: As directed    Call MD for:  extreme fatigue   Complete by: As directed    Call MD for:  persistant dizziness or light-headedness   Complete by: As directed    Call MD for:  persistant nausea and vomiting   Complete by: As directed    Call MD for:  severe uncontrolled pain   Complete by: As directed    Call MD for:  temperature >100.4   Complete by: As directed    Diet - low sodium heart healthy   Complete by: As directed    Diet Carb Modified   Complete by: As directed    Discharge instructions   Complete by: As directed    You were cared for by a hospitalist during your hospital stay. If you have any questions about your discharge medications or the care you received while you were in the hospital after you are discharged, you can call the unit and ask to speak with the hospitalist on call if the hospitalist that took care of you is not available. Once you are discharged, your primary care physician will handle any further medical issues. Please note that NO REFILLS for any discharge medications will be authorized once you are discharged, as it is imperative that you return to your primary care physician (or establish a relationship with a primary care physician if you do not have one) for your aftercare needs so that they can reassess your need for medications and monitor your lab values.   Increase activity slowly   Complete by: As directed      Allergies as of 05/09/2020      Reactions   Cymbalta [duloxetine Hcl] Other (See Comments)   Hallucinations    Fentanyl Other (See Comments)   Patches. Blistered skin.    Metformin And Related Nausea And Vomiting   Mobic [meloxicam] Nausea And Vomiting   Sulfa Antibiotics Rash   Voltaren [diclofenac] Nausea And Vomiting      Medication List    TAKE these medications    acetaminophen 325 MG tablet Commonly known as: TYLENOL Take 2 tablets (650 mg total) by mouth every 6 (six) hours as needed for mild pain (or Fever >/= 101).   albuterol 108 (90 Base) MCG/ACT inhaler Commonly known as: VENTOLIN HFA Inhale 2 puffs into the lungs every 6 (six) hours as needed for wheezing or shortness of breath.   atorvastatin 20 MG tablet Commonly known as: LIPITOR Take 1 tablet (20 mg total) by mouth daily.   carvedilol 6.25 MG tablet Commonly known as: COREG Take 6.25 mg by mouth 2 (two) times daily.   donepezil 10 MG tablet Commonly known as: ARICEPT Take 10 mg by mouth at bedtime.   FLUoxetine 10 MG capsule Commonly known as: PROZAC Take 1 capsule (10 mg total) by mouth daily.   folic acid 1 MG tablet Commonly known as: FOLVITE Take 1 tablet (1 mg total) by mouth daily.   HYDROcodone-acetaminophen 10-325 MG tablet Commonly known as:  NORCO Take 1 tablet by mouth every 6 (six) hours as needed for moderate pain or severe pain.   hydrocortisone 2.5 % rectal cream Commonly known as: ANUSOL-HC Place rectally 2 (two) times daily.   insulin lispro 100 UNIT/ML injection Commonly known as: HUMALOG Before each meal 3 times a day, 140-199 - 2 units, 200-250 - 4 units, 251-299 - 6 units,  300-349 - 8 units,  350 or above 10 units. Insulin PEN if approved, provide syringes and needles if needed. What changed:   how much to take  how to take this  when to take this   lacosamide 200 MG Tabs tablet Commonly known as: VIMPAT Take 1 tablet (200 mg total) by mouth 2 (two) times daily.   metoCLOPramide 5 MG tablet Commonly known as: REGLAN Take 5-10 mg by mouth See admin instructions. Take 2 tablets (10 mg) in the morning and Take 1 tablet (5 mg) in the evening   spironolactone 100 MG tablet Commonly known as: ALDACTONE Take 0.5 tablets (50 mg total) by mouth daily.   torsemide 100 MG tablet Commonly known as: DEMADEX Take 100 mg by mouth daily as  needed (fluid).   vitamin B-12 1000 MCG tablet Commonly known as: CYANOCOBALAMIN Take 1 tablet (1,000 mcg total) by mouth daily.            Durable Medical Equipment  (From admission, onward)         Start     Ordered   05/09/20 1100  For home use only DME Other see comment  Once       Comments: Harrel Lemon lift  Question:  Length of Need  Answer:  Lifetime   05/09/20 1100          Follow-up Information    Jani Gravel, MD. Schedule an appointment as soon as possible for a visit in 1 week(s).   Specialty: Internal Medicine Contact information: [address] [place] [address] [phone]              Allergies  Allergen Reactions  . Cymbalta [Duloxetine Hcl] Other (See Comments)    Hallucinations   . Fentanyl Other (See Comments)    Patches. Blistered skin.   . Metformin And Related Nausea And Vomiting  . Mobic [Meloxicam] Nausea And Vomiting  . Sulfa Antibiotics Rash  . Voltaren [Diclofenac] Nausea And Vomiting    Consultations:  None    Procedures/Studies: DG Chest 2 View  Result Date: 05/08/2020 CLINICAL DATA:  Left-sided chest pain, shortness of breath, lightheadedness. EXAM: CHEST - 2 VIEW COMPARISON:  05/06/2020 FINDINGS: Shallow inspiration. Cardiac enlargement. No vascular congestion, edema, or consolidation. Atelectasis in the left base. No pleural effusions. No pneumothorax. Tortuous aorta. IMPRESSION: Cardiac enlargement. No evidence of active pulmonary disease. Electronically Signed   By: Lucienne Capers M.D.   On: 05/08/2020 00:19   DG Chest 2 View  Result Date: 04/26/2020 CLINICAL DATA:  Pneumonia, chronic diastolic heart failure, sarcoid. EXAM: CHEST - 2 VIEW COMPARISON:  Multiple priors, most recent 04/24/2020 FINDINGS: Overall improved aeration lungs with similar mild diffuse interstitial opacities, most pronounced at the lateral lung bases. Similar small left pleural effusion. Overlying left basilar opacities. Similar  enlargement of the cardiac silhouette. Similar dextrocurvature of the spine. Diffuse osteopenia. IMPRESSION: 1. Overall improved aeration lungs. Similar mild diffuse interstitial opacities, suggestive of mild interstitial edema. 2. Similar small left pleural effusion. Overlying left basilar opacities may represent atelectasis, aspiration, and/or pneumonia. 3. Cardiomegaly. Electronically Signed   By:  Margaretha Sheffield MD   On: 04/26/2020 09:40   US Abdomen Complete  Result Date: 04/26/2020 CLINICAL DATA:  Hepatic cirrhosis. EXAM: ABDOMEN ULTRASOUND COMPLETE COMPARISON:  September 15, 2017. FINDINGS: Gallbladder: Status post cholecystectomy. Common bile duct: Diameter: 14 mm which may be due to post cholecystectomy status. Liver: Heterogeneous echotexture is noted with slightly nodular contours suggesting hepatic cirrhosis. No definite focal sonographic hepatic abnormality is noted. Portal vein is patent on color Doppler imaging with normal direction of blood flow towards the liver. IVC: No abnormality visualized. Pancreas: Not well visualized due to overlying bowel gas. Spleen: Size and appearance within normal limits. Right Kidney: Length: 12 cm. Echogenicity within normal limits. No mass or hydronephrosis visualized. Left Kidney: Length: 10.3 cm. Echogenicity within normal limits. No mass or hydronephrosis visualized. Abdominal aorta: No aneurysm visualized. Other findings: Mild ascites is noted around the liver. IMPRESSION: 1. Status post cholecystectomy. 2. Common bile duct is dilated at 14 mm which may be due to post cholecystectomy status. 3. Heterogeneous echotexture of hepatic parenchyma is noted with slightly nodular contours suggesting hepatic cirrhosis. No definite focal sonographic hepatic abnormality is noted. Mild ascites is noted around the liver. Electronically Signed   By: Marijo Conception M.D.   On: 04/26/2020 14:51   NM Pulmonary Perf and Vent  Result Date: 05/09/2020 CLINICAL DATA:  Chest  pain and positive D-dimer EXAM: NUCLEAR MEDICINE PERFUSION LUNG SCAN TECHNIQUE: Perfusion images were obtained in multiple projections after intravenous injection of radiopharmaceutical. Views: Anterior, posterior, left lateral, right lateral, RPO, LPO, RAO, LAO RADIOPHARMACEUTICALS:  4.3 mCi Tc-41m MAA IV COMPARISON:  Chest radiograph May 08, 2020 FINDINGS: No focal perfusion defects are evident.  There is cardiomegaly. IMPRESSION: No perfusion defects are appreciable. No findings suggesting pulmonary embolus on this study. Cardiomegaly noted. Electronically Signed   By: Lowella Grip III M.D.   On: 05/09/2020 07:53   DG CHEST PORT 1 VIEW  Result Date: 05/06/2020 CLINICAL DATA:  Pneumonia EXAM: PORTABLE CHEST 1 VIEW COMPARISON:  04/26/2020 FINDINGS: Shallow inspiration. Cardiac enlargement. Probable small left pleural effusion. No definite edema or consolidation. Mild improvement since prior study. IMPRESSION: Cardiac enlargement with small left pleural effusion. Electronically Signed   By: Lucienne Capers M.D.   On: 05/06/2020 06:08   DG Chest Portable 1 View  Result Date: 04/24/2020 CLINICAL DATA:  Fatigue/weakness and bilateral leg swelling for 2 days. EXAM: PORTABLE CHEST 1 VIEW COMPARISON:  Chest radiograph dated 12/06/2017 FINDINGS: The heart remains enlarged. The left costophrenic angle is obscured and may reflect a pleural effusion with associated atelectasis/airspace disease. There is a mild diffuse bilateral interstitial opacities which likely represent pulmonary edema. There is no right pleural effusion. There is no pneumothorax. The osseous structures appear intact. IMPRESSION: 1. Mild diffuse bilateral interstitial opacities which likely represent pulmonary edema. 2. Possible left pleural effusion with associated atelectasis/airspace disease. 3. Cardiomegaly. Electronically Signed   By: Zerita Boers M.D.   On: 04/24/2020 14:35   ECHOCARDIOGRAM COMPLETE  Result Date: 04/25/2020     ECHOCARDIOGRAM REPORT   Patient Name:   NOVA SCHMUHL Date of Exam: 04/25/2020 Medical Rec #:  [id number]     Height:       63.0 in Accession #:    [id number]    Weight:       252.3 lb Date of Birth:  1960/11/28     BSA:          2.134 m Patient Age:    59 years  BP:           175/65 mmHg Patient Gender: F             HR:           81 bpm. Exam Location:  Inpatient Procedure: 2D Echo, Color Doppler, Cardiac Doppler and Intracardiac            Opacification Agent Indications:    B15.17 Acute diastolic (congestive) heart failure  History:        Patient has prior history of Echocardiogram examinations, most                 recent 09/16/2017. CHF; Risk Factors:Hypertension, Diabetes and                 Dyslipidemia.  Sonographer:    Raquel Sarna Senior RDCS Referring Phys: Selz  Sonographer Comments: Technically difficult study due to patient body habitus and reduced apical skin integrity. IMPRESSIONS  1. Left ventricular ejection fraction, by estimation, is 60 to 65%. The left ventricle has normal function. The left ventricle has no regional wall motion abnormalities. There is mild left ventricular hypertrophy. Left ventricular diastolic parameters are indeterminate.  2. Right ventricular systolic function is normal. The right ventricular size is normal. Tricuspid regurgitation signal is inadequate for assessing PA pressure.  3. Left atrial size was mildly dilated.  4. The mitral valve is grossly normal. Trivial mitral valve regurgitation.  5. The aortic valve has an indeterminant number of cusps, possibly tricuspid with moderate calcification. Aortic valve regurgitation is not visualized. Mild to moderate aortic valve stenosis. Aortic valve mean gradient measures 13.0 mmHg. Aortic valve Vmax measures 2.61 m/s. Dimentionless index 0.34.  6. Cannot exclude very small interatrial shunt/PFO.  7. Probable ascites and left pleural effusion noted. FINDINGS  Left Ventricle: Left ventricular ejection fraction, by  estimation, is 60 to 65%. The left ventricle has normal function. The left ventricle has no regional wall motion abnormalities. Definity contrast agent was given IV to delineate the left ventricular  endocardial borders. The left ventricular internal cavity size was normal in size. There is mild left ventricular hypertrophy. Left ventricular diastolic parameters are indeterminate. Right Ventricle: The right ventricular size is normal. No increase in right ventricular wall thickness. Right ventricular systolic function is normal. Tricuspid regurgitation signal is inadequate for assessing PA pressure. Left Atrium: Left atrial size was mildly dilated. Right Atrium: Right atrial size was normal in size. Pericardium: There is no evidence of pericardial effusion. Mitral Valve: The mitral valve is grossly normal. Trivial mitral valve regurgitation. Tricuspid Valve: The tricuspid valve is grossly normal. Tricuspid valve regurgitation is trivial. Aortic Valve: The aortic valve has an indeterminant number of cusps. Aortic valve regurgitation is not visualized. Mild to moderate aortic stenosis is present. There is moderate calcification of the aortic valve. Aortic valve mean gradient measures 13.0 mmHg. Aortic valve peak gradient measures 27.2 mmHg. Aortic valve area, by VTI measures 1.19 cm. Pulmonic Valve: The pulmonic valve was grossly normal. Pulmonic valve regurgitation is trivial. Aorta: The aortic root is normal in size and structure. IAS/Shunts: Cannot exclude very small interatrial shunt/PFO.  LEFT VENTRICLE PLAX 2D LVIDd:         5.00 cm  Diastology LVIDs:         2.90 cm  LV e' lateral:   9.46 cm/s LV PW:         1.20 cm  LV E/e' lateral: 7.8 LV IVS:  1.00 cm  LV e' medial:    10.10 cm/s LVOT diam:     2.10 cm  LV E/e' medial:  7.3 LV SV:         69 LV SV Index:   32 LVOT Area:     3.46 cm  RIGHT VENTRICLE RV S prime:     12.20 cm/s TAPSE (M-mode): 3.3 cm LEFT ATRIUM             Index       RIGHT ATRIUM            Index LA diam:        5.20 cm 2.44 cm/m  RA Area:     16.60 cm LA Vol (A2C):   58.4 ml 27.36 ml/m RA Volume:   40.40 ml  18.93 ml/m LA Vol (A4C):   79.5 ml 37.25 ml/m LA Biplane Vol: 67.9 ml 31.81 ml/m  AORTIC VALVE AV Area (Vmax):    1.07 cm AV Area (Vmean):   1.32 cm AV Area (VTI):     1.19 cm AV Vmax:           261.00 cm/s AV Vmean:          161.000 cm/s AV VTI:            0.583 m AV Peak Grad:      27.2 mmHg AV Mean Grad:      13.0 mmHg LVOT Vmax:         80.90 cm/s LVOT Vmean:        61.200 cm/s LVOT VTI:          0.200 m LVOT/AV VTI ratio: 0.34  AORTA Ao Root diam: 2.90 cm Ao Asc diam:  3.10 cm MITRAL VALVE MV Area (PHT): 2.54 cm    SHUNTS MV Decel Time: 299 msec    Systemic VTI:  0.20 m MV E velocity: 73.50 cm/s  Systemic Diam: 2.10 cm MV A velocity: 81.40 cm/s MV E/A ratio:  0.90 Rozann Lesches MD Electronically signed by Rozann Lesches MD Signature Date/Time: 04/25/2020/10:42:22 AM    Final    VAS [place] LOWER EXTREMITY VENOUS (DVT)  Result Date: 05/08/2020  Lower Venous DVTStudy Indications: Swelling, and Elevated d-dimer.  Risk Factors: History of DVT LT. Limitations: Body habitus and poor ultrasound/tissue interface. Comparison Study: 01-06-2020 Prior LT lower venous study available. Performing Technologist: Darlin Coco  Examination Guidelines: A complete evaluation includes B-mode imaging, spectral Doppler, color Doppler, and power Doppler as needed of all accessible portions of each vessel. Bilateral testing is considered an integral part of a complete examination. Limited examinations for reoccurring indications may be performed as noted. The reflux portion of the exam is performed with the patient in reverse Trendelenburg.  +---------+---------------+---------+-----------+----------+-------------------+ RIGHT    CompressibilityPhasicitySpontaneityPropertiesThrombus Aging      +---------+---------------+---------+-----------+----------+-------------------+ CFV      Full            Yes      Yes                                      +---------+---------------+---------+-----------+----------+-------------------+ SFJ      Full                                                             +---------+---------------+---------+-----------+----------+-------------------+  FV Prox  Full           Yes      Yes                                      +---------+---------------+---------+-----------+----------+-------------------+ FV Mid   Full           Yes      Yes                                      +---------+---------------+---------+-----------+----------+-------------------+ FV DistalFull                                                             +---------+---------------+---------+-----------+----------+-------------------+ PFV      Full           Yes      Yes                                      +---------+---------------+---------+-----------+----------+-------------------+ POP      Full           Yes      Yes                                      +---------+---------------+---------+-----------+----------+-------------------+ PTV      Full                                         Some segments not                                                         well visualized     +---------+---------------+---------+-----------+----------+-------------------+ PERO     Full                                         Some segments not                                                         well visualized     +---------+---------------+---------+-----------+----------+-------------------+   +---------+---------------+---------+-----------+----------+-------------------+ LEFT     CompressibilityPhasicitySpontaneityPropertiesThrombus Aging      +---------+---------------+---------+-----------+----------+-------------------+ CFV      Full                                                              +---------+---------------+---------+-----------+----------+-------------------+  SFJ      Full                                                             +---------+---------------+---------+-----------+----------+-------------------+ FV Prox  Full                                                             +---------+---------------+---------+-----------+----------+-------------------+ FV Mid   Full           Yes      Yes                                      +---------+---------------+---------+-----------+----------+-------------------+ FV DistalFull           Yes      Yes                                      +---------+---------------+---------+-----------+----------+-------------------+ PFV      Full                                                             +---------+---------------+---------+-----------+----------+-------------------+ POP      Full           Yes      Yes                                      +---------+---------------+---------+-----------+----------+-------------------+ PTV      Full                                         Some segments not                                                         well visualized     +---------+---------------+---------+-----------+----------+-------------------+ PERO     Full                                         Some segments not                                                         well visualized     +---------+---------------+---------+-----------+----------+-------------------+  Summary: RIGHT: - There is no evidence of deep vein thrombosis in the lower extremity. However, portions of this examination were limited- see technologist comments above.  - No cystic structure found in the popliteal fossa.  LEFT: - There is no evidence of deep vein thrombosis in the lower extremity. However, portions of this examination were limited- see technologist comments above.  - No cystic  structure found in the popliteal fossa.  *See table(s) above for measurements and observations. Electronically signed by Monica Martinez MD on 05/08/2020 at 5:20:08 PM.    Final       Discharge Exam: Vitals:   05/08/20 2355 05/09/20 0842  BP: (!) 184/66 (!) 162/62  Pulse: 62 65  Resp: 18 17  Temp: 97.9 F (36.6 C)   SpO2: 100%     General: Pt is alert, awake, not in acute distress Cardiovascular: RRR, S1/S2 +, +edema Respiratory: CTA bilaterally, no wheezing, no rhonchi, no respiratory distress, no conversational dyspnea, on 2L [place] O2  Abdominal: Soft, NT, ND, bowel sounds + Extremities: +chronic edema, no cyanosis Psych: Normal mood and affect, stable judgement and insight     The results of significant diagnostics from this hospitalization (including imaging, microbiology, ancillary and laboratory) are listed below for reference.     Microbiology: Recent Results (from the past 240 hour(s))  SARS Coronavirus 2 by RT PCR (hospital order, performed in [facility] hospital lab) Nasopharyngeal Nasopharyngeal Swab     Status: None   Collection Time: 05/02/20  9:49 AM   Specimen: Nasopharyngeal Swab  Result Value Ref Range Status   SARS Coronavirus 2 NEGATIVE NEGATIVE Final    Comment: (NOTE) SARS-CoV-2 target nucleic acids are NOT DETECTED.  The SARS-CoV-2 RNA is generally detectable in upper and lower respiratory specimens during the acute phase of infection. The lowest concentration of SARS-CoV-2 viral copies this assay can detect is 250 copies / mL. A negative result does not preclude SARS-CoV-2 infection and should not be used as the sole basis for treatment or other patient management decisions.  A negative result may occur with improper specimen collection / handling, submission of specimen other than nasopharyngeal swab, presence of viral mutation(s) within the areas targeted by this assay, and inadequate number of viral copies (<250 copies / mL). A negative result  must be combined with clinical observations, patient history, and epidemiological information.  Fact Sheet for Patients:   StrictlyIdeas.no  Fact Sheet for Healthcare Providers: BankingDealers.co.za  This test is not yet approved or  cleared by the [place] FDA and has been authorized for detection and/or diagnosis of SARS-CoV-2 by FDA under an Emergency Use Authorization (EUA).  This EUA will remain in effect (meaning this test can be used) for the duration of the COVID-19 declaration under Section 564(b)(1) of the Act, 21 U.S.C. section 360bbb-3(b)(1), unless the authorization is terminated or revoked sooner.  Performed at [facility] [place] [address], [place], [place] [address]   SARS Coronavirus 2 by RT PCR (hospital order, performed in [place] hospital lab)     Status: None   Collection Time: 05/07/20 10:10 AM  Result Value Ref Range Status   SARS Coronavirus 2 NEGATIVE NEGATIVE Final    Comment: (NOTE) SARS-CoV-2 target nucleic acids are NOT DETECTED.  The SARS-CoV-2 RNA is generally detectable in upper and lower respiratory specimens during the acute phase of infection. The lowest concentration of SARS-CoV-2 viral copies this assay can detect is 250 copies / mL. A negative result does not preclude  SARS-CoV-2 infection and should not be used as the sole basis for treatment or other patient management decisions.  A negative result may occur with improper specimen collection / handling, submission of specimen other than nasopharyngeal swab, presence of viral mutation(s) within the areas targeted by this assay, and inadequate number of viral copies (<250 copies / mL). A negative result must be combined with clinical observations, patient history, and epidemiological information.  Fact Sheet for Patients:   StrictlyIdeas.no  Fact Sheet for Healthcare  Providers: BankingDealers.co.za  This test is not yet approved or  cleared by the [place] FDA and has been authorized for detection and/or diagnosis of SARS-CoV-2 by FDA under an Emergency Use Authorization (EUA).  This EUA will remain in effect (meaning this test can be used) for the duration of the COVID-19 declaration under Section 564(b)(1) of the Act, 21 U.S.C. section 360bbb-3(b)(1), unless the authorization is terminated or revoked sooner.  Performed at [facility], [place] [address] [place], [place] [address]   SARS Coronavirus 2 by RT PCR (hospital order, performed in [facility] hospital lab) Nasopharyngeal Nasopharyngeal Swab     Status: None   Collection Time: 05/08/20  4:56 AM   Specimen: Nasopharyngeal Swab  Result Value Ref Range Status   SARS Coronavirus 2 NEGATIVE NEGATIVE Final    Comment: (NOTE) SARS-CoV-2 target nucleic acids are NOT DETECTED.  The SARS-CoV-2 RNA is generally detectable in upper and lower respiratory specimens during the acute phase of infection. The lowest concentration of SARS-CoV-2 viral copies this assay can detect is 250 copies / mL. A negative result does not preclude SARS-CoV-2 infection and should not be used as the sole basis for treatment or other patient management decisions.  A negative result may occur with improper specimen collection / handling, submission of specimen other than nasopharyngeal swab, presence of viral mutation(s) within the areas targeted by this assay, and inadequate number of viral copies (<250 copies / mL). A negative result must be combined with clinical observations, patient history, and epidemiological information.  Fact Sheet for Patients:   StrictlyIdeas.no  Fact Sheet for Healthcare Providers: BankingDealers.co.za  This test is not yet approved or  cleared by the [place] FDA and has been authorized for  detection and/or diagnosis of SARS-CoV-2 by FDA under an Emergency Use Authorization (EUA).  This EUA will remain in effect (meaning this test can be used) for the duration of the COVID-19 declaration under Section 564(b)(1) of the Act, 21 U.S.C. section 360bbb-3(b)(1), unless the authorization is terminated or revoked sooner.  Performed at [facility], [place] [address] [place], [place] [address]      Labs: BNP (last 3 results) Recent Labs    04/24/20 1349 05/07/20 2353  BNP 264.5* 270.7*   Basic Metabolic Panel: Recent Labs  Lab 05/04/20 0458 05/04/20 0458 05/05/20 0508 05/06/20 0459 05/07/20 0454 05/07/20 2353 05/09/20 0553  NA 131*   < > 134* 130* 134* 131* 134*  133*  K 4.6   < > 4.5 4.1 4.7 4.4 4.4  4.3  CL 94*   < > 94* 94* 93* 91* 94*  94*  CO2 31   < > 32 32 31 31 33*  33*  GLUCOSE 94   < > 120* 105* 100* 130* 98  100*  BUN 31*   < > 34* 34* 37* 36* 34*  32*  CREATININE 1.25*   < > 1.45* 1.45* 1.49* 1.57* 1.37*  1.36*  CALCIUM 7.7*   < >  8.0* 7.7* 8.0* 7.9* 8.0*  8.4*  MG 1.9  --   --   --  1.8  --   --    < > = values in this interval not displayed.   Liver Function Tests: Recent Labs  Lab 05/06/20 0459 05/07/20 0454 05/07/20 2353  AST 13* 14* 15  ALT 10 10 12   ALKPHOS 55 54 61  BILITOT 0.3 0.5 0.4  PROT 5.5* 5.8* 5.5*  ALBUMIN 1.7* 1.8* 1.5*   No results for input(s): LIPASE, AMYLASE in the last 168 hours. No results for input(s): AMMONIA in the last 168 hours. CBC: Recent Labs  Lab 05/07/20 0454 05/07/20 2353 05/08/20 1443 05/09/20 0553  WBC 8.2 8.0  --  8.3  NEUTROABS 6.1 6.3  --   --   HGB 7.0* 6.0* 8.8* 8.3*  HCT 22.7* 19.7* 28.6* 26.7*  MCV 96.6 96.6  --  93.0  PLT 143* 155  --  163   Cardiac Enzymes: No results for input(s): CKTOTAL, CKMB, CKMBINDEX, TROPONINI in the last 168 hours. BNP: Invalid input(s): POCBNP CBG: Recent Labs  Lab 05/08/20 0751 05/08/20 1250 05/08/20 1644 05/08/20 2031 05/09/20 0634   GLUCAP 97 118* 118* 155* 103*   D-Dimer Recent Labs    05/08/20 0156  DDIMER 5.68*   Hgb A1c No results for input(s): HGBA1C in the last 72 hours. Lipid Profile No results for input(s): CHOL, HDL, LDLCALC, TRIG, CHOLHDL, LDLDIRECT in the last 72 hours. Thyroid function studies No results for input(s): TSH, T4TOTAL, T3FREE, THYROIDAB in the last 72 hours.  Invalid input(s): FREET3 Anemia work up Recent Labs    05/08/20 1443 05/09/20 0553  VITAMINB12  --  569  FOLATE  --  5.6*  FERRITIN  --  230  TIBC  --  179*  IRON  --  28  RETICCTPCT 3.9*  --    Urinalysis    Component Value Date/Time   COLORURINE AMBER (A) 04/24/2020 1407   APPEARANCEUR CLOUDY (A) 04/24/2020 1407   LABSPEC 1.017 04/24/2020 1407   PHURINE 5.0 04/24/2020 1407   GLUCOSEU NEGATIVE 04/24/2020 1407   HGBUR MODERATE (A) 04/24/2020 1407   BILIRUBINUR NEGATIVE 04/24/2020 1407   [place] 04/24/2020 1407   PROTEINUR >=300 (A) 04/24/2020 1407   UROBILINOGEN 1.0 03/03/2014 0942   NITRITE NEGATIVE 04/24/2020 1407   LEUKOCYTESUR MODERATE (A) 04/24/2020 1407   Sepsis Labs Invalid input(s): PROCALCITONIN,  WBC,  LACTICIDVEN Microbiology Recent Results (from the past 240 hour(s))  SARS Coronavirus 2 by RT PCR (hospital order, performed in [place] hospital lab) Nasopharyngeal Nasopharyngeal Swab     Status: None   Collection Time: 05/02/20  9:49 AM   Specimen: Nasopharyngeal Swab  Result Value Ref Range Status   SARS Coronavirus 2 NEGATIVE NEGATIVE Final    Comment: (NOTE) SARS-CoV-2 target nucleic acids are NOT DETECTED.  The SARS-CoV-2 RNA is generally detectable in upper and lower respiratory specimens during the acute phase of infection. The lowest concentration of SARS-CoV-2 viral copies this assay can detect is 250 copies / mL. A negative result does not preclude SARS-CoV-2 infection and should not be used as the sole basis for treatment or other patient management decisions.  A  negative result may occur with improper specimen collection / handling, submission of specimen other than nasopharyngeal swab, presence of viral mutation(s) within the areas targeted by this assay, and inadequate number of viral copies (<250 copies / mL). A negative result must be combined with clinical observations, patient history, and  epidemiological information.  Fact Sheet for Patients:   StrictlyIdeas.no  Fact Sheet for Healthcare Providers: BankingDealers.co.za  This test is not yet approved or  cleared by the [place] FDA and has been authorized for detection and/or diagnosis of SARS-CoV-2 by FDA under an Emergency Use Authorization (EUA).  This EUA will remain in effect (meaning this test can be used) for the duration of the COVID-19 declaration under Section 564(b)(1) of the Act, 21 U.S.C. section 360bbb-3(b)(1), unless the authorization is terminated or revoked sooner.  Performed at [facility], [place] [address], [place], [place] [address]   SARS Coronavirus 2 by RT PCR (hospital order, performed in [place] hospital lab)     Status: None   Collection Time: 05/07/20 10:10 AM  Result Value Ref Range Status   SARS Coronavirus 2 NEGATIVE NEGATIVE Final    Comment: (NOTE) SARS-CoV-2 target nucleic acids are NOT DETECTED.  The SARS-CoV-2 RNA is generally detectable in upper and lower respiratory specimens during the acute phase of infection. The lowest concentration of SARS-CoV-2 viral copies this assay can detect is 250 copies / mL. A negative result does not preclude SARS-CoV-2 infection and should not be used as the sole basis for treatment or other patient management decisions.  A negative result may occur with improper specimen collection / handling, submission of specimen other than nasopharyngeal swab, presence of viral mutation(s) within the areas targeted by this assay, and inadequate number  of viral copies (<250 copies / mL). A negative result must be combined with clinical observations, patient history, and epidemiological information.  Fact Sheet for Patients:   StrictlyIdeas.no  Fact Sheet for Healthcare Providers: BankingDealers.co.za  This test is not yet approved or  cleared by the [place] FDA and has been authorized for detection and/or diagnosis of SARS-CoV-2 by FDA under an Emergency Use Authorization (EUA).  This EUA will remain in effect (meaning this test can be used) for the duration of the COVID-19 declaration under Section 564(b)(1) of the Act, 21 U.S.C. section 360bbb-3(b)(1), unless the authorization is terminated or revoked sooner.  Performed at [facility], [place] [address], [place], [place] [address]   SARS Coronavirus 2 by RT PCR (hospital order, performed in [facility] hospital lab) Nasopharyngeal Nasopharyngeal Swab     Status: None   Collection Time: 05/08/20  4:56 AM   Specimen: Nasopharyngeal Swab  Result Value Ref Range Status   SARS Coronavirus 2 NEGATIVE NEGATIVE Final    Comment: (NOTE) SARS-CoV-2 target nucleic acids are NOT DETECTED.  The SARS-CoV-2 RNA is generally detectable in upper and lower respiratory specimens during the acute phase of infection. The lowest concentration of SARS-CoV-2 viral copies this assay can detect is 250 copies / mL. A negative result does not preclude SARS-CoV-2 infection and should not be used as the sole basis for treatment or other patient management decisions.  A negative result may occur with improper specimen collection / handling, submission of specimen other than nasopharyngeal swab, presence of viral mutation(s) within the areas targeted by this assay, and inadequate number of viral copies (<250 copies / mL). A negative result must be combined with clinical observations, patient history, and epidemiological  information.  Fact Sheet for Patients:   StrictlyIdeas.no  Fact Sheet for Healthcare Providers: BankingDealers.co.za  This test is not yet approved or  cleared by the [place] FDA and has been authorized for detection and/or diagnosis of SARS-CoV-2 by FDA under an Emergency Use Authorization (EUA).  This EUA  will remain in effect (meaning this test can be used) for the duration of the COVID-19 declaration under Section 564(b)(1) of the Act, 21 U.S.C. section 360bbb-3(b)(1), unless the authorization is terminated or revoked sooner.  Performed at [facility], [place] [address], [place], [place] [address]      Patient was seen and examined on the day of discharge and was found to be in stable condition. Time coordinating discharge: 35 minutes including assessment and coordination of care, as well as examination of the patient.   SIGNED:  Dessa Phi, DO [facility] 05/09/2020, 11:10 AM

## 2020-05-09 NOTE — Progress Notes (Signed)
Occupational Therapy Evaluation Patient Details Name: Marissa Wilkerson MRN: [id number] DOB: [birth year] Today's Date: 05/09/2020    History of Present Illness Marissa Wilkerson is a 59 y.o. female with medical history significant for type 2 diabetes mellitus, anemia, chronic kidney disease stage IIIa, pulmonary sarcoidosis, chronic hypoxic respiratory failure, recent imaging concerning for cirrhosis, seizure disorder, history of left lower extremity DVT, and recent admission for acute on chronic diastolic CHF during which she was diuresed 5.1 L and discharged to SNF yesterday but experienced acute onset chest pain shortly after arrival there and return to the ED. Patient reports that she was upset about being discharged to a nursing facility, was angry at her husband for not agreeing to pick her up and take her home, and then began to develop a double pain in the central and left chest. Found to have Hgb of 7.0   Clinical Impression   PT admitted with chest pain from SNF. Pt currently with functional limitiations due to the deficits listed below (see OT problem list). Pt and spouse to d/c home with education that SNF recommendations. The session focused on the education of needs at home to keep patient safe and decrease risk for skin break down. Educated on frequent position changes and need to utilize the lift for OOB at home.  Pt will benefit from skilled OT to increase their independence and safety with adls and balance to allow discharge [facility] RN aide ( due to decline SNF).     Follow Up Recommendations  SNF;Supervision/Assistance - 24 hour (declines and will go home with HHOT)    Equipment Recommendations  Other (comment) Harrel Lemon / aide and RN)    Recommendations for Other Services Other (comment) (home aide and RN)     Precautions / Restrictions Precautions Precautions: Fall Precaution Comments: strong fear of falling, oxygen dependent Restrictions Other Position/Activity Restrictions: reports  I can't put weight on my legs      Mobility Bed Mobility Overal bed mobility: Needs Assistance Bed Mobility: Rolling;Supine to Sit;Sit to Supine Rolling: +2 for physical assistance;Max assist Sidelying to sit: +2 for physical assistance;Total assist Supine to sit: +2 for physical assistance;Total assist Sit to supine: +2 for physical assistance;Total assist   General bed mobility comments: pt with R lateral lean with static sitting with L hip externally rotated. Pt tolerates ~8 minutes eob. attempting to laterally scoot toward [facility] with bed tilted and total dependance on therapists  Transfers                 General transfer comment: will be a lift transfer. pt has not stood on legs in >4 weeks    Balance Overall balance assessment: Needs assistance Sitting-balance support: Bilateral upper extremity supported;Feet supported Sitting balance-Leahy Scale: Poor Sitting balance - Comments: reliance on therapist initially and progressed to min guard sitting Postural control: Other (comment) (R lateral lean)                                 ADL either performed or assessed with clinical judgement   ADL Overall ADL's : Needs assistance/impaired Eating/Feeding: Set up;Bed level Eating/Feeding Details (indicate cue type and reason): reports requires things setup to be able to complete task     Upper Body Bathing: Maximal assistance   Lower Body Bathing: Total assistance   Upper Body Dressing : Maximal assistance   Lower Body Dressing: Total assistance     Toilet Transfer Details (  indicate cue type and reason): incontinence of bowel and states "i think i might have gone to bathroom" pt has not called RN staff for (A) to address incontinence. Pt only states due to OT questioning [place] and Hygiene: Total assistance Toileting - Clothing Manipulation Details (indicate cue type and reason): pt side lying for peri care and rolling better toward  L side       General ADL Comments: spouse arriving and showing patient photo of cat KikI while sitting EOB. Pt tolerates EOB ~8 minutes and reports fatigue. Pt and spouse educated on how to help direct home services to maximize needs first then continued therapy. Family reports never using lift for transfer because they were never educated and returning lift. Pt reports not taking daily baths but more like weeks between due to decrease ability to perform. spouse provided handout with several concerns mentioned so that he has a reference. a lift guide form was provided so that spouse can show this as an example to the lift company to get a manual or instructions on how to use the lift with an actual demonstration. OT informed pt and spouse the form is the act as a guide for what they need for the actual lift provided to them in home. THis was an example of how it should look and the details they should be provided   HOME CARE QUESTIONS/ Check List   Daily bed bath  ? How do to position ? How to complete the task  Use of the [place] lift ? How to place the pad ? How to lift from bed to wheelchair  How to transfer onto bedside commode  Vision Baseline Vision/History: Wears glasses Wears Glasses: Reading only       Perception     Praxis      Pertinent Vitals/Pain Pain Assessment: Faces Faces Pain Scale: Hurts whole lot Pain Location: lower back and left hip painful even at rest Pain Descriptors / Indicators: Aching;Discomfort;Grimacing Pain Intervention(s): Monitored during session;Premedicated before session;Repositioned     Hand Dominance Right   Extremity/Trunk Assessment Upper Extremity Assessment Upper Extremity Assessment: Overall WFL for tasks assessed   Lower Extremity Assessment Lower Extremity Assessment: Generalized weakness;Defer to PT evaluation (noted that bil LE edema)   Cervical / Trunk Assessment Cervical / Trunk Assessment: Other exceptions Cervical / Trunk  Exceptions: hx of back pain   Communication Communication Communication: No difficulties   Cognition Arousal/Alertness: Awake/alert Behavior During Therapy: Anxious Overall Cognitive Status: Impaired/Different from baseline Area of Impairment: Safety/judgement;Awareness                         Safety/Judgement: Decreased awareness of safety;Decreased awareness of deficits Awareness: Emergent   General Comments: Pt oriented but requesting to go home stating my husband will help me. Pt self reports that fear of falling can be self limiting at times.    General Comments  redness and break down noted in peri area from pure wick and incontinence. Barrier cream applied heavily and hygiene for patient.     Exercises     Shoulder Instructions      Home Living Family/patient expects to be discharged to:: Skilled nursing facility Living Arrangements: Spouse/significant other Available Help at Discharge: Family Type of Home: House Home Access: Ramped entrance     Home Layout: One level               Home Equipment: Environmental consultant - 2 wheels;Wheelchair - [employer];Tub  bench   Additional Comments: 6 furry children- "cats"  favorite is Geologist, engineering       Prior Functioning/Environment Level of Independence: Needs assistance  Gait / Transfers Assistance Needed: transfer to w/c ADL's / Homemaking Assistance Needed: reports that baths are weeks apart   Comments: incontinence of bowel per patient and prior to month ago was being placed on 3n1 total (A) by spouse        OT Problem List: Decreased strength;Impaired balance (sitting and/or standing);Decreased activity tolerance;Decreased safety awareness;Decreased knowledge of use of DME or AE;Decreased knowledge of precautions;Pain;Obesity;Cardiopulmonary status limiting activity      OT Treatment/Interventions: Self-care/ADL training;Therapeutic exercise;Energy conservation;DME and/or AE instruction;Manual  therapy;Therapeutic activities;Patient/family education;Balance training;Cognitive remediation/compensation    OT Goals(Current goals can be found in the care plan section) Acute Rehab OT Goals Patient Stated Goal: to go home to cats OT Goal Formulation: With patient/family Time For Goal Achievement: 05/23/20 Potential to Achieve Goals: Fair  OT Frequency: Min 3X/week   Barriers to D/C:            Co-evaluation PT/OT/SLP Co-Evaluation/Treatment: Yes Reason for Co-Treatment: For patient/therapist safety;To address functional/ADL transfers;Necessary to address cognition/behavior during functional activity   OT goals addressed during session: ADL's and self-care;Proper use of Adaptive equipment and DME;Strengthening/ROM      AM-PAC OT "6 Clicks" Daily Activity     Outcome Measure Help from another person eating meals?: A Little Help from another person taking care of personal grooming?: A Little Help from another person toileting, which includes using toliet, bedpan, or urinal?: Total Help from another person bathing (including washing, rinsing, drying)?: A Lot Help from another person to put on and taking off regular upper body clothing?: A Lot Help from another person to put on and taking off regular lower body clothing?: Total 6 Click Score: 12   End of Session Equipment Utilized During Treatment: Oxygen (2L [place]) Nurse Communication: Mobility status;Precautions  Activity Tolerance: Patient tolerated treatment well Patient left: in bed;with call bell/phone within reach;with bed alarm set;with family/visitor present  OT Visit Diagnosis: Unsteadiness on feet (R26.81);Muscle weakness (generalized) (M62.81);Pain;Other abnormalities of gait and mobility (R26.89)                Time: 1540-0867 OT Time Calculation (min): 57 min Charges:  OT General Charges $OT Visit: 1 Visit OT Evaluation $OT Eval Moderate Complexity: 1 Mod OT Treatments $Self Care/Home Management : 8-22  mins   Brynn, OTR/L  Acute Rehabilitation Services Pager: [phone] Office: [phone] .   Jeri Modena 05/09/2020, 10:29 AM

## 2020-05-09 NOTE — Evaluation (Signed)
Physical Therapy Evaluation Patient Details Name: Marissa Wilkerson MRN: [id number] DOB: 08/24/1961 Today's Date: 05/09/2020   History of Present Illness  Marissa Wilkerson is a 59 y.o. female with medical history significant for type 2 diabetes mellitus, anemia, chronic kidney disease stage IIIa, pulmonary sarcoidosis, chronic hypoxic respiratory failure, recent imaging concerning for cirrhosis, seizure disorder, history of left lower extremity DVT, and recent admission for acute on chronic diastolic CHF during which she was diuresed 5.1 L and discharged to SNF yesterday but experienced acute onset chest pain shortly after arrival there and return to the ED. Patient reports that she was upset about being discharged to a nursing facility, was angry at her husband for not agreeing to pick her up and take her home, and then began to develop a double pain in the central and left chest. Found to have Hgb of 7.0  Clinical Impression   Pt admitted with above diagnosis. Comes from SNF where she did not tolerate being, and clearly wishes not to return to SNF; has been non-ambulatory for at least 6 months, and depends on her husband for transfers and ADLs; Presents to PT with significant deficits in functional mobility, and activity tolerance;  Requires Total assist for bed moblity, getting to EOB, and hygeine/pericare; Pt currently with functional limitations due to the deficits listed below (see PT Problem List). Pt will benefit from skilled PT to increase their independence and safety with mobility to allow discharge to the venue listed below.       Follow Up Recommendations SNF;Supervision/Assistance - 24 hour (While SNF for post-acute rehab is more than appropriate, she is refusing return to SNF, and will need maximal [facility] Services)    Equipment Recommendations  Other (comment) (hoyer lift ambulance transport)    Recommendations for Other Services       Precautions / Restrictions Precautions Precautions:  Fall Precaution Comments: strong fear of falling, oxygen dependent Restrictions Other Position/Activity Restrictions: reports I can't put weight on my legs      Mobility  Bed Mobility Overal bed mobility: Needs Assistance Bed Mobility: Rolling;Supine to Sit;Sit to Supine Rolling: +2 for physical assistance;Max assist Sidelying to sit: +2 for physical assistance;Total assist Supine to sit: +2 for physical assistance;Total assist Sit to supine: +2 for physical assistance;Total assist   General bed mobility comments: pt with R lateral lean with static sitting with L hip externally rotated. Pt tolerates ~8 minutes eob. attempting to laterally scoot toward [facility] with bed tilted and total dependance on therapists  Transfers                 General transfer comment: will be a lift transfer. pt has not stood on legs in >4 weeks  Ambulation/Gait             General Gait Details: pt has been non amb for > 6 months  Stairs            Wheelchair Mobility    Modified Rankin (Stroke Patients Only)       Balance Overall balance assessment: Needs assistance Sitting-balance support: Bilateral upper extremity supported;Feet supported Sitting balance-Leahy Scale: Poor Sitting balance - Comments: reliance on therapist initially and progressed to min guard sitting Postural control: Other (comment) (R lateral lean)                                   Pertinent Vitals/Pain Pain Assessment: Faces Faces Pain Scale:  Hurts whole lot Pain Location: lower back and left hip painful even at rest Pain Descriptors / Indicators: Aching;Discomfort;Grimacing Pain Intervention(s): Monitored during session    Home Living Family/patient expects to be discharged to:: Skilled nursing facility Living Arrangements: Spouse/significant other Available Help at Discharge: Family Type of Home: House Home Access: Ramped entrance     Home Layout: One level Home Equipment: Environmental consultant  - 2 wheels;Wheelchair - [employer];Tub bench Additional Comments: 6 furry children- "cats"  favorite is Geologist, engineering     Prior Function Level of Independence: Needs assistance   Gait / Transfers Assistance Needed: transfer to w/c  ADL's / Homemaking Assistance Needed: reports that baths are weeks apart  Comments: incontinence of bowel per patient and prior to month ago was being placed on 3n1 total (A) by spouse     Hand Dominance   Dominant Hand: Right    Extremity/Trunk Assessment   Upper Extremity Assessment Upper Extremity Assessment: Defer to OT evaluation    Lower Extremity Assessment Lower Extremity Assessment: Generalized weakness (Edematous LEs; decr ankle, knee hip ROM secondary to body habitus, edema, and pain)    Cervical / Trunk Assessment Cervical / Trunk Assessment: Other exceptions Cervical / Trunk Exceptions: hx of back pain  Communication   Communication: No difficulties  Cognition Arousal/Alertness: Awake/alert Behavior During Therapy: Anxious Overall Cognitive Status: Impaired/Different from baseline Area of Impairment: Safety/judgement;Awareness                         Safety/Judgement: Decreased awareness of safety;Decreased awareness of deficits Awareness: Emergent   General Comments: Pt oriented but requesting to go home stating my husband will help me. Pt self reports that fear of falling can be self limiting at times.       General Comments General comments (skin integrity, edema, etc.): redness and break down noted in peri area from pure wick and incontinence. Barrier cream applied heavily and hygiene for patient.     Exercises     Assessment/Plan    PT Assessment All further PT needs can be met in the next venue of care  PT Problem List Decreased strength;Decreased mobility;Decreased balance;Decreased knowledge of use of DME;Decreased activity tolerance       PT Treatment Interventions DME instruction;Therapeutic  exercise;Balance training;Wheelchair mobility training;Functional mobility training;Therapeutic activities;Patient/family education    PT Goals (Current goals can be found in the Care Plan section)  Acute Rehab PT Goals Patient Stated Goal: to go home to cats PT Goal Formulation: With patient Time For Goal Achievement: 05/23/20 Potential to Achieve Goals: Fair    Frequency Min 3X/week   Barriers to discharge        Co-evaluation PT/OT/SLP Co-Evaluation/Treatment: Yes Reason for Co-Treatment: For patient/therapist safety;To address functional/ADL transfers;Necessary to address cognition/behavior during functional activity PT goals addressed during session: Mobility/safety with mobility         AM-PAC PT "6 Clicks" Mobility  Outcome Measure Help needed turning from your back to your side while in a flat bed without using bedrails?: Total Help needed moving from lying on your back to sitting on the side of a flat bed without using bedrails?: Total Help needed moving to and from a bed to a chair (including a wheelchair)?: Total Help needed standing up from a chair using your arms (e.g., wheelchair or bedside chair)?: Total Help needed to walk in hospital room?: Total Help needed climbing 3-5 steps with a railing? : Total 6 Click Score: 6    End of  Session Equipment Utilized During Treatment: Oxygen Activity Tolerance: Patient limited by fatigue Patient left: in bed Nurse Communication: Mobility status PT Visit Diagnosis: Muscle weakness (generalized) (M62.81);Other abnormalities of gait and mobility (R26.89)    Time: 2099-0689 PT Time Calculation (min) (ACUTE ONLY): 47 min   Charges:   PT Evaluation $PT Eval Moderate Complexity: 1 Mod          Roney [place], [place]  Acute Rehabilitation Services Pager [phone] Office [phone]   Colletta [place] 05/09/2020, 2:35 PM

## 2020-05-10 MED FILL — SPIRONOLACTONE 100 MG TAB: 100 | 60 days supply | Qty: 30 | Fill #0

## 2020-05-12 MED FILL — HYDROCODON-APAP 10-325: 10-325 | 20 days supply | Qty: 120 | Fill #0

## 2020-05-14 ENCOUNTER — Encounter: Payer: Self-pay | Admitting: Internal Medicine

## 2020-05-14 NOTE — Progress Notes (Signed)
This encounter was created in error - please disregard.

## 2020-05-24 MED FILL — PROMETHAZINE 25 MG TABLET: 25 | 90 days supply | Qty: 360 | Fill #0

## 2020-05-24 MED FILL — tiZANidine HCL 4 MG TABS: 4 | 90 days supply | Qty: 270 | Fill #0

## 2020-05-24 MED FILL — DONEPEZIL HCL 10 MG TABLET: 10 | 30 days supply | Qty: 30 | Fill #3

## 2020-06-10 ENCOUNTER — Other Ambulatory Visit (HOSPITAL_COMMUNITY): Payer: Self-pay | Admitting: *Deleted

## 2020-06-10 MED FILL — HYDROCODON-APAP 10-325: 10-325 | 20 days supply | Qty: 120 | Fill #0

## 2020-06-17 MED FILL — FLUoxetine HCL 10 MG CAPS: 10 | 90 days supply | Qty: 90 | Fill #2

## 2020-06-18 ENCOUNTER — Telehealth: Payer: Self-pay | Admitting: Emergency Medicine

## 2020-06-18 NOTE — Telephone Encounter (Signed)
Pt's husband returning a phone call. Pt's husband can be reached at [phone].

## 2020-06-18 NOTE — Telephone Encounter (Signed)
Left message for either patient or her husband Fulton Reek to call back.

## 2020-06-19 NOTE — Telephone Encounter (Signed)
Called and spoke with husband who states patient needs refills on her medications. Patient last OV was 2019 and advised him that she needs on OV before we are able to do that. Husband states that patient is bedridden and needs to be TELEVISIT. Patient is now scheduled for Monday 06/21/20 with Aaron Edelman at 3:30. Nothing further needed at this time.

## 2020-06-21 ENCOUNTER — Other Ambulatory Visit: Payer: Self-pay | Admitting: Pulmonary Disease

## 2020-06-21 ENCOUNTER — Encounter: Payer: Self-pay | Admitting: Pulmonary Disease

## 2020-06-21 ENCOUNTER — Other Ambulatory Visit: Payer: Self-pay

## 2020-06-21 ENCOUNTER — Telehealth: Payer: Self-pay | Admitting: Pulmonary Disease

## 2020-06-21 ENCOUNTER — Ambulatory Visit (INDEPENDENT_AMBULATORY_CARE_PROVIDER_SITE_OTHER): Payer: Medicare Other | Admitting: Pulmonary Disease

## 2020-06-21 DIAGNOSIS — R112 Nausea with vomiting, unspecified: Secondary | ICD-10-CM

## 2020-06-21 DIAGNOSIS — D860 Sarcoidosis of lung: Secondary | ICD-10-CM | POA: Diagnosis not present

## 2020-06-21 DIAGNOSIS — J310 Chronic rhinitis: Secondary | ICD-10-CM

## 2020-06-21 DIAGNOSIS — J9611 Chronic respiratory failure with hypoxia: Secondary | ICD-10-CM

## 2020-06-21 DIAGNOSIS — R531 Weakness: Secondary | ICD-10-CM

## 2020-06-21 MED ORDER — ALBUTEROL SULFATE HFA 108 (90 BASE) MCG/ACT IN AERS: 2.0000 | INHALATION_SPRAY | Freq: Four times a day (QID) | RESPIRATORY_TRACT | 5 refills | Status: DC | PRN

## 2020-06-21 MED FILL — DONEPEZIL HCL 10 MG TABLET: 10 | 30 days supply | Qty: 30 | Fill #0

## 2020-06-21 MED FILL — ALBUTEROL SULFATE HFA 108 (: 108 (90 BAS | 25 days supply | Qty: 9 | Fill #0

## 2020-06-21 NOTE — Assessment & Plan Note (Signed)
Plan: Continue fluticasone nasal spray Continue Benadryl at night for management of chronic rhinitis

## 2020-06-21 NOTE — Assessment & Plan Note (Signed)
Physical deconditioning Patient to continue to work with PT/OT/home health May need to discuss SNF placement with primary care if patient continues to decline physically

## 2020-06-21 NOTE — Patient Instructions (Addendum)
You were seen today by Lauraine Rinne, NP  for:   1. Sarcoidosis of lung ([place])  Only use your albuterol as a rescue medication to be used if you can't catch your breath by resting or doing a relaxed purse lip breathing pattern.  - The less you use it, the better it will work when you need it. - Ok to use up to 2 puffs  every 4 hours if you must but call for immediate appointment if use goes up over your usual need - Don't leave home without it !!  (think of it like the spare tire for your car)   2. Chronic respiratory failure with hypoxia (HCC)  Continue oxygen therapy as prescribed  >>>maintain oxygen saturations greater than 88 percent  >>>if unable to maintain oxygen saturations please contact the office  >>>do not smoke with oxygen  >>>can use nasal saline gel or nasal saline rinses to moisturize nose if oxygen causes dryness  3. Chronic rhinitis  Continue Flonase nasal medication as prescribed  4. Nausea and vomiting, intractability of vomiting not specified, unspecified vomiting type  Keep an eye on your symptoms  If symptoms continue to persist or worsen please seek follow-up with Dr. Maudie Mercury with primary care  Follow Up:    Return in about 2 months (around 08/21/2020), or if symptoms worsen or fail to improve, for Follow up with Dr. Lamonte Sakai.   Notification of test results are managed in the following manner: If there are  any recommendations or changes to the  plan of care discussed in office today,  we will contact you and let you know what they are. If you do not hear from [place], then your results are normal and you can view them through your  MyChart account , or a letter will be sent to you. Thank you again for trusting [place] with your care  - Thank you, [facility]    It is flu season:   >>> Best ways to protect herself from the flu: Receive the yearly flu vaccine, practice good hand hygiene washing with soap and also using hand sanitizer when available, eat a nutritious  meals, get adequate rest, hydrate appropriately       Please contact the office if your symptoms worsen or you have concerns that you are not improving.   Thank you for choosing [facility] for your healthcare, and for allowing [place] to partner with you on your healthcare journey. I am thankful to be able to provide care to you today.   Wyn Quaker FNP-C

## 2020-06-21 NOTE — Progress Notes (Signed)
Virtual Visit via Telephone Note  I connected with Eugenie Filler on 06/21/20 at  3:30 PM EDT by telephone and verified that I am speaking with the correct person using two identifiers.  Location: Patient: Home Provider: Office [facility] - [address], [place], [place], [place] [address]   I discussed the limitations, risks, security and privacy concerns of performing an evaluation and management service by telephone and the availability of in person appointments. I also discussed with the patient that there may be a patient responsible charge related to this service. The patient expressed understanding and agreed to proceed.  Patient consented to consult via telephone: Yes People present and their role in pt care: Pt     History of Present Illness:  59 year old female former smoker followed in our office for sarcoidosis  Past medical history: Type 2 diabetes, diastolic CHF, cirrhosis of the liver, hypertension Smoking history: Former smoker.  Quit 2013.  12.5-pack-year Maintenance: none Patient of Dr. Lamonte Sakai  Chief complaint: hosp follow up   59 year old female former smoker followed in our office for sarcoidosis.  She was last seen in our office by Dr. Lamonte Sakai in August/2019.  Plan of care from that office visit was for her to still be followed clinically as her chest imaging from February/2019 showed the infiltrates had resolved.  Plan of care was for her to obtain a chest x-ray at next visit.  She was encouraged to remain on 3 L of O2.  Sleep study did not show obstructive sleep apnea.  She was encouraged to remain on Flonase and Benadryl for management of chronic rhinitis.  Her Flonase was increased to twice a day.  If she continued to have worsening drainage may need to consider Atrovent or Astelin nasal spray in addition.  Since last being seen patient has been hospitalized multiple times.  In the last 6 months patient has been hospitalized in May/2020 for a total of 4  days at [facility] for weakness, vomiting.  She was also hospitalized again in August/2021 for 13 days as a CHF exacerbation.  Most recently on 05/08/2019 when she was hospitalized for symptomatic anemia.  She was discharged on 05/09/2020.  An excerpt of that discharge summary is listed below:   Admit date: 05/07/2020 Discharge date: 05/09/2020  Admitted From: SNF Disposition: Home with home health, patient declined SNF   Recommendations for Outpatient Follow-up:  1. Follow up with PCP in 1 week  Discharge Condition: Stable CODE STATUS: DNR   Brief/Interim Summary: Ardena Gangl Milleris a 58 y.o.femalewith medical history significant fortype 2 diabetes mellitus, anemia, chronic kidney disease stage IIIa, pulmonary sarcoidosis, chronic hypoxic respiratory failureon 4 L of nasal cannula O2 at baseline, recent imaging concerning for cirrhosis, seizure disorder, history of left lower extremity DVT, and recent admission for acute on chronic diastolic CHF during which she was diuresed 5.1 L and discharged to SNF9/10but experienced acute onset chest pain shortly after arrival there and return to the ED.Patient reports that she was upset about being discharged to a nursing facility, was angry at her husband for not agreeing to pick her up and take her home, and then began to develop achestpain in the central and left chest. Symptoms were constant, severe, without any alleviating or exacerbating factors identified, and associated with some nausea without vomiting and shortness of breath but no cough, no diaphoresis.Reports that she had a clot in her left leg many years ago, is unsure of what provoked this, and states that  she was on anticoagulation only briefly.  She underwent work-up including venous Dopplers which were negative for DVT, VQ scan which was negative for PE.  Her chest pain and shortness of breath improved and she was on her normal baseline nasal cannula oxygen level.  She work with  physical therapy who recommended SNF placement, which patient declined.  She and her husband felt that she could discharge home with home health services.   Discharge Diagnoses:  Principal Problem:   Symptomatic anemia Active Problems:   Chronic respiratory failure (HCC)   Sarcoidosis of lung (HCC)   Chronic diastolic CHF (congestive heart failure) (HCC)   Acute renal failure superimposed on stage 3a chronic kidney disease (HCC)   History of seizure   Atypical chest pain   Positive D-dimer   Hyponatremia   Other cirrhosis of liver (HCC)   Chest pain -Troponin10 -VQ scan negative for PE -Lower extremity Dopplers negative for DVT -Could be secondary to symptomatic anemia, see below  Pulmonary sarcoidosis, chronic hypoxemic respiratory failure -Requires 4 L nasal cannula O2 at baseline -Stable  Symptomatic anemia Acute on chronicnormocyticanemia, secondary to anemia of chronic disease -Baseline hemoglobin from previous hospitalization was 7-8 -No complaints of melena, hematochezia, nausea vomiting -FOBT negative -Status post 2 unit packed red blood cells  -Hemoglobin improved  AKI on CKD stage IIIa -Baseline creatinine 1.4  -Stable  Cirrhosis -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  Will discharge home with home health services.  59 year old female former smoker followed in our office for sarcoidosis.  Patient completing televisit today with spouse on call as well.  Her pulmonary symptoms are stable.  She does not feel like these have  worsened since last being seen in August/2019.  Unfortunately she does have persisting episodes of nausea and vomiting.  She has known gastroparesis.  She is working with primary care regarding this.  Patient does feel that she is a little bit more dehydrated.  Barbera Setters has a next visit with primary care in December/2021.  She is maintained on 4 L of O2 24/7.  She is currently bedridden.  She is working with home health, OT and PT at home.  Is initially suggested that patient would benefit from SNF placement, she declined this recommendation.  Patient admits she has not been as active lately.  She is trying to do as much activity as she can.   Observations/Objective:  05/08/2020-chest x-ray-cardiac enlargement, no evidence of active pulmonary disease  05/09/2020-pulmonary perfusion and ventilation-no perfusion deficits are appreciable, no findings suggestive of pulmonary emboli, cardiomegaly noted  04/25/2020-echocardiogram-LV ejection fraction 60 to 65%, right ventricular systolic function is normal, right ventricular size is normal,  03/12/2018-sleep study-no significant OSA  09/11/2013-pulmonary function test-  FVC 1.89 (55% predicted), postbronchodilator ratio 79, post bronchodilator FEV1 1.54 (57% predicted), no bronchodilator response, TLC 3.79 (77% predicted), DLCO 12.83 (56% predicted)  Social History   Tobacco Use  Smoking Status Former Smoker  . Packs/day: 0.50  . Years: 25.00  . Pack years: 12.50  . Types: Cigarettes  . Quit date: 08/21/2012  . Years since quitting: 7.8  Smokeless Tobacco Never Used   Immunization History  Administered Date(s) Administered  . Influenza Split 05/12/2013  . Influenza,inj,Quad PF,6+ Mos 05/31/2015, 04/28/2016  . Influenza-Unspecified 06/02/2014, 06/05/2016, 06/13/2017  . Moderna SARS-COVID-2 Vaccination 03/31/2020, 05/07/2020  . Pneumococcal Conjugate-13 08/29/2015  . Pneumococcal Polysaccharide-23 05/12/2013  . Tdap 05/12/2013      Assessment  and Plan:  Sarcoidosis of lung ([place]) Last chest x-ray in September/2021 was clear  Plan: Continue oxygen therapy Continue rescue Saba inhaler as needed Follow-up in 2 months Continue to clinically monitor  Chronic rhinitis Plan: Continue fluticasone nasal spray Continue Benadryl at night for management of chronic rhinitis  Chronic respiratory failure ([place]) Plan: Continue oxygen therapy-4 L of O2 at all times  Weakness Physical deconditioning Patient to continue to work with PT/OT/home health May need to discuss SNF placement with primary care if patient continues to decline physically   Follow Up Instructions:  Return in about 2 months (around 08/21/2020), or if symptoms worsen or fail to improve, for Follow up with Dr. Lamonte Sakai.   I discussed the assessment and treatment plan with the patient. The patient was provided an opportunity to ask questions and all were answered. The patient agreed with the plan and demonstrated an understanding of the instructions.   The patient was advised to call back or seek an in-person evaluation if the symptoms worsen or if the condition fails to improve as anticipated.  I provided 26 minutes of non-face-to-face time during this encounter.   Lauraine Rinne, NP

## 2020-06-21 NOTE — Assessment & Plan Note (Signed)
Plan: Continue oxygen therapy-4 L of O2 at all times

## 2020-06-21 NOTE — Assessment & Plan Note (Signed)
Last chest x-ray in September/2021 was clear  Plan: Continue oxygen therapy Continue rescue Saba inhaler as needed Follow-up in 2 months Continue to clinically monitor

## 2020-06-21 NOTE — Telephone Encounter (Signed)
Rx for pt's albuterol inhaler has been sent to preferred pharmacy for pt. Called and spoke with pt's husband Fulton Reek letting him know this had been done and he verbalized understanding.nothing further needed.

## 2020-07-08 ENCOUNTER — Other Ambulatory Visit (HOSPITAL_COMMUNITY): Payer: Self-pay | Admitting: *Deleted

## 2020-07-10 MED FILL — ALBUTEROL SULFATE HFA 108 (: 108 (90 BAS | 25 days supply | Qty: 9 | Fill #1

## 2020-07-12 ENCOUNTER — Other Ambulatory Visit (HOSPITAL_COMMUNITY): Payer: Self-pay | Admitting: Internal Medicine

## 2020-07-12 MED FILL — SPIRONOLACTONE 100 MG TAB: 100 | 30 days supply | Qty: 30 | Fill #0

## 2020-07-12 MED FILL — HYDROCODON-APAP 10-325: 10-325 | 20 days supply | Qty: 120 | Fill #0

## 2020-07-17 MED FILL — DONEPEZIL HCL 10 MG TABLET: 10 | 30 days supply | Qty: 30 | Fill #1

## 2020-07-17 MED FILL — TORSEMIDE 100 MG TABLET: 100 | 90 days supply | Qty: 90 | Fill #1

## 2020-07-17 MED FILL — VIMPAT 200 MG TABLET: 200 | 90 days supply | Qty: 180 | Fill #1

## 2020-07-17 MED FILL — CARVEDILOL 6.25 MG TABLET: 6.25 | 90 days supply | Qty: 180 | Fill #1

## 2020-07-17 MED FILL — ATORVASTATIN CALCIUM 20 MG: 20 | 90 days supply | Qty: 90 | Fill #1

## 2020-08-05 ENCOUNTER — Other Ambulatory Visit (HOSPITAL_COMMUNITY): Payer: Self-pay | Admitting: *Deleted

## 2020-08-10 MED FILL — ALBUTEROL SULFATE HFA 108 (: 108 (90 BAS | 25 days supply | Qty: 9 | Fill #2

## 2020-08-10 MED FILL — HYDROCODON-APAP 10-325: 10-325 | 20 days supply | Qty: 120 | Fill #0

## 2020-08-11 ENCOUNTER — Encounter: Payer: Self-pay | Admitting: Emergency Medicine

## 2020-08-11 ENCOUNTER — Ambulatory Visit (INDEPENDENT_AMBULATORY_CARE_PROVIDER_SITE_OTHER): Payer: Medicare Other | Admitting: Emergency Medicine

## 2020-08-11 ENCOUNTER — Other Ambulatory Visit: Payer: Self-pay

## 2020-08-11 DIAGNOSIS — D860 Sarcoidosis of lung: Secondary | ICD-10-CM

## 2020-08-11 NOTE — Progress Notes (Signed)
Virtual Visit via Telephone Note  I connected with Marissa Wilkerson on 08/11/20 at  4:30 PM EST by telephone and verified that I am speaking with the correct person using two identifiers.  Location: Patient: Home  Provider: Office   I discussed the limitations, risks, security and privacy concerns of performing an evaluation and management service by telephone and the availability of in person appointments. I also discussed with the patient that there may be a patient responsible charge related to this service. The patient expressed understanding and agreed to proceed.   History of Present Illness: 59 year old former smoker (13 pack years) with a history of sarcoidosis with pulmonary and renal manifestations, previously on methotrexate, associated obstructive lung disease, nocturnal hypoxemia on 3-4 l/min.  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  She is significantly debilitated, weak and has been housebound, practically bedbound.   Observations/Objective: COVID vaccine up to date She reports that she is quite debilitated since hospitalized for labile CHF, renal failure. Was quite debilitated, has been slowly rehabbed. A V/q during the hospitalization was low prob. She is using 4L/min.  She has albuterol and is using a few times a week, sometimes gets choked up on her saliva, secretions and albuterol will help w this. No sputum, no wheeze. Her activity is minimal so difficulty to assess functional capacity.    Assessment and Plan: Sarcoidosis with pulmonary and renal manifestations.  Most recent chest imaging by chest x-ray was reassuring during her hospitalization.  Minimal albuterol use.  She is not on long-acting bronchodilators.  I think at this point given her other issues which are causing debilitation we can hold off on repeating her CT scan of the chest.  I think she needs to continue  to recover from her CHF hospitalization, renal failure.  She needs either home health PT or inpatient PT and this is being discussed.  For now I will continue her albuterol as needed.  We will follow-up in 6 months and discuss timing of any repeat chest imaging at that time.  Follow Up Instructions: 6 months, either in person or telephone visit.  I discussed the assessment and treatment plan with the patient. The patient was provided an opportunity to ask questions and all were answered. The patient agreed with the plan and demonstrated an understanding of the instructions.   The patient was advised to call back or seek an in-person evaluation if the symptoms worsen or if the condition fails to improve as anticipated.  I provided 21 minutes of non-face-to-face time during this encounter.   Baltazar Apo, MD, PhD 08/11/2020, 4:37 PM [facility] [phone] or if no answer [phone]

## 2020-08-23 ENCOUNTER — Other Ambulatory Visit (HOSPITAL_COMMUNITY): Payer: Self-pay | Admitting: Rheumatology

## 2020-08-23 MED FILL — PROMETHAZINE 25 MG TABLET: 25 | 90 days supply | Qty: 360 | Fill #0

## 2020-08-23 MED FILL — tiZANidine HCL 4 MG TABS: 4 | 90 days supply | Qty: 270 | Fill #0

## 2020-09-02 ENCOUNTER — Other Ambulatory Visit (HOSPITAL_COMMUNITY): Payer: Self-pay | Admitting: *Deleted

## 2020-09-07 ENCOUNTER — Other Ambulatory Visit (HOSPITAL_COMMUNITY): Payer: Self-pay | Admitting: Internal Medicine

## 2020-09-07 MED FILL — ALBUTEROL SULFATE HFA 108 (: 108 (90 BAS | 25 days supply | Qty: 9 | Fill #3

## 2020-09-07 MED FILL — spIRONOLACTONE 100 MG TAB: 100 | 30 days supply | Qty: 30 | Fill #0

## 2020-09-10 MED FILL — HYDROCODON-APAP 10-325: 10-325 | 30 days supply | Qty: 120 | Fill #0

## 2020-09-10 MED FILL — FLUoxetine HCL 10 MG CAPS: 10 | 90 days supply | Qty: 90 | Fill #3

## 2020-10-11 MED FILL — CARVEDILOL 6.25 MG TABLET: 6.25 | 90 days supply | Qty: 180 | Fill #2

## 2020-10-11 MED FILL — ATORVASTATIN CALCIUM 20 MG: 20 | 90 days supply | Qty: 90 | Fill #2

## 2020-10-11 MED FILL — ALBUTEROL SULFATE HFA 108 (: 108 (90 BAS | 25 days supply | Qty: 9 | Fill #4

## 2020-10-11 MED FILL — TORSEMIDE 100 MG TABLET: 100 | 90 days supply | Qty: 90 | Fill #2

## 2020-10-14 ENCOUNTER — Other Ambulatory Visit (HOSPITAL_COMMUNITY): Payer: Self-pay | Admitting: *Deleted

## 2020-10-14 MED FILL — HYDROCODON-APAP 10-325: 10-325 | 20 days supply | Qty: 120 | Fill #0

## 2020-10-15 ENCOUNTER — Other Ambulatory Visit (HOSPITAL_COMMUNITY): Payer: Self-pay | Admitting: Neurology

## 2020-10-15 MED FILL — VIMPAT 200 MG TABLET: 200 | 30 days supply | Qty: 60 | Fill #0

## 2020-10-18 ENCOUNTER — Other Ambulatory Visit (HOSPITAL_COMMUNITY): Payer: Self-pay | Admitting: Neurology

## 2020-10-18 ENCOUNTER — Telehealth: Payer: Self-pay

## 2020-10-18 MED FILL — DONEPEZIL HCL 10 MG TABLET: 10 | 30 days supply | Qty: 30 | Fill #0

## 2020-10-18 NOTE — Telephone Encounter (Signed)
I connected by phone with Marissa Wilkerson and/or patient's caregiver on 10/18/2020 at 3:50 PM to discuss the potential vaccination through our Homebound vaccination initiative.   Prevaccination Checklist for COVID-19 Vaccines  1.  Are you feeling sick today? no  2.  Have you ever received a dose of a COVID-19 vaccine?  yes      If yes, which one? Moderna   How many dose of Covid-19 vaccine have your received and dates ? 2, 03/31/2020, 05/07/2020   Check all that apply: I live in a long-term care setting. no  I have been diagnosed with a medical condition(s). Please list: Pulmonary Sarcoidosis, CHF, cirrhosis of liver, stage III kidney disease, history of seizures (pertinent to homebound status)  I am a first responder. no  I work in a long-term care facility, correctional facility, hospital, restaurant, retail setting, school, or other setting with high exposure to the public. no  4. Do you have a health condition or are you undergoing treatment that makes you moderately or severely immunocompromised? (This would include treatment for cancer or HIV, receipt of organ transplant, immunosuppressive therapy or high-dose corticosteroids, CAR-T-cell therapy, hematopoietic cell transplant [HCT], DiGeorge syndrome or Wiskott-Aldrich syndrome)  Yes-Sarcoidosis  5. Have you received hematopoietic cell transplant (HCT) or CAR-T-cell therapies since receiving COVID-19 vaccine? no  6.  Have you ever had an allergic reaction: (This would include a severe reaction [ e.g., anaphylaxis] that required treatment with epinephrine or EpiPen or that caused you to go to the hospital.  It would also include an allergic reaction that occurred within 4 hours that caused hives, swelling, or respiratory distress, including wheezing.) A.  A previous dose of COVID-19 vaccine. no  B.  A vaccine or injectable therapy that contains multiple components, one of which is a COVID-19 vaccine component, but it is not known which component  elicited the immediate reaction. no  C.  Are you allergic to polyethylene glycol? no  D. Are you allergic to Polysorbate, which is found in some vaccines, film coated tablets and intravenous steroids?  no   7.  Have you ever had an allergic reaction to another vaccine (other than COVID-19 vaccine) or an injectable medication? (This would include a severe reaction [ e.g., anaphylaxis] that required treatment with epinephrine or EpiPen or that caused you to go to the hospital.  It would also include an allergic reaction that occurred within 4 hours that caused hives, swelling, or respiratory distress, including wheezing.)  no   8.  Have you ever had a severe allergic reaction (e.g., anaphylaxis) to something other than a component of the COVID-19 vaccine, or any vaccine or injectable medication?  This would include food, pet, venom, environmental, or oral medication allergies.  Yes, Sulfa drug causes a rash.  Check all that apply to you:  Am a female between ages 2 and 89 years old  no  Women 47 through 59 years of age can receive any FDA-authorized or -approved COVID-19 vaccine. However, they should be informed of the rare but increased risk of thrombosis with thrombocytopenia syndrome (TTS) after receipt of the [employer] Vaccine and the availability of other FDA-authorized and -approved COVID-19 vaccines. People who had TTS after a first dose of Janssen vaccine should not receive a subsequent dose of Janssen product    Am a female between ages 25 and 65 years old  no Males 5 through 59 years of age may receive the correct formulation of Pfizer-BioNTech COVID-19 vaccine. Males 39 and older  can receive any FDA-authorized or -approved vaccine. However, people receiving an mRNA COVID-19 vaccine, especially males 30 through 59 years of age and their parents/legal representative (when relevant), should be informed of the risk of developing myocarditis (an inflammation of the heart muscle) or  pericarditis (inflammation of the lining around the heart) after receipt of an mRNA vaccine. The risk of developing either myocarditis or pericarditis after vaccination is low, and lower than the risk of myocarditis associated with SARS-CoV-2 infection in adolescents and adults. Vaccine recipients should be counseled about the need to seek care if symptoms of myocarditis or pericarditis develop after vaccination     Have a history of myocarditis or pericarditis  no Myocarditis or pericarditis after receipt of the first dose of an mRNA COVID-19 vaccine series but before administration of the second dose  Experts advise that people who develop myocarditis or pericarditis after a dose of an mRNA COVID-19 vaccine not receive a subsequent dose of any COVID-19 vaccine, until additional safety data are available.  Administration of a subsequent dose of COVID-19 vaccine before safety data are available can be considered in certain circumstances after the episode of myocarditis or pericarditis has completely resolved. Until additional data are available, some experts recommend a Alphonsa Overall COVID-19 vaccine be considered instead of an mRNA COVID-19 vaccine. Decisions about proceeding with a subsequent dose should include a conversation between the patient, their parent/legal representative (when relevant), and their clinical team, which may include a cardiologist.    Have been treated with monoclonal antibodies or convalescent serum to prevent or treat COVID-19  no Vaccination should be offered to people regardless of history of prior symptomatic or asymptomatic SARS-CoV-2 infection. There is no recommended minimal interval between infection and vaccination.  However, vaccination should be deferred if a patient received monoclonal antibodies or convalescent serum as treatment for COVID-19 or for post-exposure prophylaxis. This is a precautionary measure until additional information becomes available, to avoid  interference of the antibody treatment with vaccine-induced immune responses.  Defer COVID-19 vaccination for 30 days when a passive antibody product was used for post-exposure prophylaxis.  Defer COVID-19 vaccination for 90 days when a passive antibody product was used to treat COVID-19.     Diagnosed with Multisystem Inflammatory Syndrome (MIS-C or MIS-A) after a COVID-19 infection  no It is unknown if people with a history of MIS-C or MIS-A are at risk for a dysregulated immune response to COVID-19 vaccination.  People with a history of MIS-C or MIS-A may choose to be vaccinated. Considerations for vaccination may include:   Clinical recovery from MIS-C or MIS-A, including return to normal cardiac function   Personal risk of severe acute COVID-19 (e.g., age, underlying conditions)   High or substantial community transmission of SARS-CoV-2 and personal increased risk of reinfection.   Timing of any immunomodulatory therapies (general best practice guidelines for immunization can be consulted for more information [email or web address])   It has been 90 days or more since their diagnosis of MIS-C   Onset of MIS-C occurred before any COVID-19 vaccination   A conversation between the patient, their guardian(s), and their clinical team or a specialist may assist with COVID-19 vaccination decisions. Healthcare providers and health departments may also request a consultation from the [facility] at [email or web address].     Have a bleeding disorder  no Take a blood thinner  no As with all vaccines, any COVID-19 vaccine product may be given to these patients, if a physician familiar with the patient's  bleeding risk determines that the vaccine can be administered intramuscularly with reasonable safety.  ACIP recommends the following technique for intramuscular vaccination in  patients with bleeding disorders or taking blood thinners: a fine-gauge needle (23-gauge or smaller caliber) should be used for the vaccination, followed by firm pressure on the site, without rubbing, for at least 2 minutes.  People who regularly take aspirin or anticoagulants as part of their routine medications do not need to stop these medications prior to receipt of any COVID-19 vaccine.    Have a history of heparin-induced thrombocytopenia (HIT)  no Although the etiology of TTS associated with the Alphonsa Overall COVID-19 vaccine is unclear, it appears to be similar to another rare immune-mediated syndrome, heparin-induced thrombocytopenia (HIT). People with a history of an episode of an immune-mediated syndrome characterized by thrombosis and thrombocytopenia, such as HIT, should be offered a currently FDA-approved or FDA-authorized mRNA COVID-19 vaccine if it has been ?90 days since their TTS resolved. After 90 days, patients may be vaccinated with any currently FDA-approved or FDA-authorized COVID-19 vaccine, including Janssen COVID-19 Vaccine. However, people who developed TTS after their initial Alphonsa Overall vaccine should not receive a Janssen booster dose.  Experts believe the following factors do not make people more susceptible to TTS after receipt of the [employer]. People with these conditions can be vaccinated with any FDA-authorized or - approved COVID-19 vaccine, including the [employer] COVID-19 Vaccine:   A prior history of venous thromboembolism   Risk factors for venous thromboembolism (e.g., inherited or acquired thrombophilia including Factor V Leiden; prothrombin gene 20210A mutation; antiphospholipid syndrome; protein C, protein S or antithrombin deficiency   A prior history of other types of thromboses not associated with thrombocytopenia   Pregnancy, post-partum status, or receipt of hormonal contraceptives (e.g., combined oral contraceptives, patch, ring)   Additional  recipient education materials can be found at http://gutierrez-robinson.com/ vaccines/safety/JJUpdate.html.    Am currently pregnant or breastfeeding  no Vaccination is recommended for all people aged 76 years and older, including people that are:   Pregnant   Breastfeeding   Trying to get pregnant now or who might become pregnant in the future   Pregnant, breastfeeding, and post-partum people 56 through 59 years of age should be aware of the rare risk of TTS after receipt of the Alphonsa Overall COVID-19 Vaccine and the availability of other FDA-authorized or -approved COVID-19 vaccines (i.e., mRNA vaccines).    Have received dermal fillers  no FDA-authorized or -approved COVID-19 vaccines can be administered to people who have received injectable dermal fillers who have no contraindications for vaccination.  Infrequently, these people might experience temporary swelling at or near the site of Wilkerson injection (usually the face or lips) following administration of a dose of an mRNA COVID-19 vaccine. These people should be advised to contact their healthcare provider if swelling develops at or near the site of dermal Wilkerson following vaccination.     Have a history of Guillain-Barr Syndrome (GBS)  no People with a history of GBS can receive any FDA-authorized or -approved COVID-19 vaccine. However, given the possible association between the [employer] and an increased risk of GBS, a patient with a history of GBS and their clinical team should discuss the availability of mRNA vaccines to offer protection against COVID-19. The highest risk has been observed in men aged 25-64 years with symptoms of GBS beginning within 42 days after Alphonsa Overall COVID-19 vaccination.  People who had GBS after receiving Janssen vaccine should be made aware of the option  to receive an mRNA COVID-19 vaccine booster at least 2 months (8 weeks) after the Janssen dose. However, Alphonsa Overall vaccine may be used as a booster,  particularly if GBS occurred more than 42 days after vaccination or was related to a non-vaccine factor. Prior to booster vaccination, a conversation between the patient and their clinical team may assist with decisions about use of a COVID-19 booster dose, including the timing of administration     Postvaccination Observation Times for People without Contraindications to Covid 19 Vaccination.  30 minutes:  People with a history of: A contraindication to another type of COVID-19 vaccine product (i.e., mRNA or viral vector COVID-19 vaccines)   Immediate (within 4 hours of exposure) non-severe allergic reaction to a COVID-19 vaccine or injectable therapies   Anaphylaxis due to any cause   Immediate allergic reaction of any severity to a non-COVID-19 vaccine   15 minutes: All other people  This patient is a 60 y.o. female that meets the FDA criteria to receive homebound vaccination. Patient or parent/caregiver understands they have the option to accept or refuse homebound vaccination.  Patient passed the pre-screening checklist and would like to proceed with homebound vaccination.  Based on questionnaire above, I recommend the patient be observed for 15 minutes.  There are an estimated #0 other household members/caregivers who are also interested in receiving the vaccine.    The patient has been confirmed homebound and eligible for homebound vaccination with the considerations outlined above. I will send the patient's information to our scheduling team who will reach out to schedule the patient and potential caregiver/family members for homebound vaccination.    Dan Humphreys 10/18/2020 3:50 PM

## 2020-10-25 ENCOUNTER — Other Ambulatory Visit (HOSPITAL_COMMUNITY): Payer: Self-pay | Admitting: Neurology

## 2020-11-09 ENCOUNTER — Other Ambulatory Visit (HOSPITAL_COMMUNITY): Payer: Self-pay | Admitting: Family Medicine

## 2020-11-11 ENCOUNTER — Other Ambulatory Visit (HOSPITAL_COMMUNITY): Payer: Self-pay | Admitting: *Deleted

## 2020-11-16 ENCOUNTER — Ambulatory Visit: Payer: Medicare Other | Attending: Critical Care Medicine

## 2020-11-16 DIAGNOSIS — Z23 Encounter for immunization: Secondary | ICD-10-CM

## 2020-11-16 NOTE — Progress Notes (Signed)
   Covid-19 Vaccination Clinic  Name:  Marissa Wilkerson    MRN: [id number] DOB: September 21, 1960  11/16/2020  [name] was observed post Covid-19 immunization for 15 minutes without incident. She was provided with Vaccine Information Sheet and instruction to access the V-Safe system.   [name] was instructed to call 911 with any severe reactions post vaccine: [name] Difficulty breathing  . Swelling of face and throat  . A fast heartbeat  . A bad rash all over body  . Dizziness and weakness   Immunizations Administered    Name Date Dose VIS Date Route   Moderna Covid-19 Booster Vaccine 11/16/2020 10:08 AM 0.25 mL 06/16/2020 Intramuscular   Manufacturer: Moderna   Lot: [id number]   [place]: [id number]

## 2020-11-18 ENCOUNTER — Other Ambulatory Visit (HOSPITAL_BASED_OUTPATIENT_CLINIC_OR_DEPARTMENT_OTHER): Payer: Self-pay

## 2020-12-06 ENCOUNTER — Other Ambulatory Visit (HOSPITAL_COMMUNITY): Payer: Self-pay

## 2020-12-06 MED ORDER — TIZANIDINE HCL 4 MG PO TABS
ORAL_TABLET | ORAL | 0 refills | Status: AC
  Filled 2020-12-06: qty 270, 90d supply, fill #0

## 2020-12-06 MED ORDER — PROMETHAZINE HCL 25 MG PO TABS
ORAL_TABLET | ORAL | 0 refills | Status: AC
  Filled 2020-12-06: qty 360, 90d supply, fill #0

## 2020-12-07 ENCOUNTER — Other Ambulatory Visit (HOSPITAL_COMMUNITY): Payer: Self-pay

## 2020-12-07 MED FILL — Albuterol Sulfate Inhal Aero 108 MCG/ACT (90MCG Base Equiv): RESPIRATORY_TRACT | 25 days supply | Qty: 8.5 | Fill #0 | Status: AC

## 2020-12-08 ENCOUNTER — Other Ambulatory Visit (HOSPITAL_COMMUNITY): Payer: Self-pay

## 2020-12-09 ENCOUNTER — Other Ambulatory Visit (HOSPITAL_COMMUNITY): Payer: Self-pay

## 2020-12-09 MED ORDER — GABAPENTIN 100 MG PO CAPS
ORAL_CAPSULE | ORAL | 0 refills | Status: DC
  Filled 2020-12-09: qty 72, 30d supply, fill #0

## 2020-12-09 MED ORDER — HYDROCODONE-ACETAMINOPHEN 10-325 MG PO TABS
ORAL_TABLET | ORAL | 0 refills | Status: DC
Start: 2020-12-11 — End: 2021-01-06
  Filled 2020-12-13: qty 120, 30d supply, fill #0

## 2020-12-10 ENCOUNTER — Other Ambulatory Visit (HOSPITAL_COMMUNITY): Payer: Self-pay

## 2020-12-13 ENCOUNTER — Other Ambulatory Visit (HOSPITAL_COMMUNITY): Payer: Self-pay

## 2020-12-22 ENCOUNTER — Telehealth: Payer: Self-pay | Admitting: Emergency Medicine

## 2020-12-22 NOTE — Telephone Encounter (Signed)
Called and spoke with patient's husband. He stated that he was calling to get her scheduled for her 6 month f/u in June. He has requested to have this be a virtual visit since the patient is nonambulatory. Reviewed patient's last office and did see where RB state a virtual visit would be ok.   She has been scheduled for a MyChart video visit on 01/26/21 at 230pm. Patient's husband is aware of the appt and verbalized understanding.   Nothing further needed at time of call.

## 2021-01-06 ENCOUNTER — Other Ambulatory Visit (HOSPITAL_COMMUNITY): Payer: Self-pay

## 2021-01-06 MED ORDER — HYDROCODONE-ACETAMINOPHEN 10-325 MG PO TABS
ORAL_TABLET | ORAL | 0 refills | Status: DC
  Filled 2021-01-12: qty 120, 30d supply, fill #0

## 2021-01-12 ENCOUNTER — Other Ambulatory Visit (HOSPITAL_COMMUNITY): Payer: Self-pay

## 2021-01-26 ENCOUNTER — Telehealth (INDEPENDENT_AMBULATORY_CARE_PROVIDER_SITE_OTHER): Payer: Medicare Other | Admitting: Emergency Medicine

## 2021-01-26 ENCOUNTER — Encounter: Payer: Self-pay | Admitting: Emergency Medicine

## 2021-01-26 DIAGNOSIS — D860 Sarcoidosis of lung: Secondary | ICD-10-CM

## 2021-01-26 DIAGNOSIS — J449 Chronic obstructive pulmonary disease, unspecified: Secondary | ICD-10-CM

## 2021-01-26 DIAGNOSIS — J9611 Chronic respiratory failure with hypoxia: Secondary | ICD-10-CM | POA: Diagnosis not present

## 2021-01-26 NOTE — Assessment & Plan Note (Signed)
No changes in symptoms to suggest active sarcoid either in the long, skin, eyes.  Given her immobility and difficulty getting out I think it is okay for [place] to defer surveillance imaging, surveillance PFT.  If she changes clinically then we will have to push for these things.

## 2021-01-26 NOTE — Assessment & Plan Note (Signed)
Continue to keep albuterol available if needed.  Using rarely.

## 2021-01-26 NOTE — Assessment & Plan Note (Signed)
Continue oxygen at 3 L/min at all times 

## 2021-01-26 NOTE — Progress Notes (Signed)
Virtual Visit via Video Note  I connected with Marissa Wilkerson on 01/26/21 at  2:30 PM EDT by a video enabled telemedicine application and verified that I am speaking with the correct person using two identifiers.  Location: Patient: Home Provider: Office   I discussed the limitations of evaluation and management by telemedicine and the availability of in person appointments. The patient expressed understanding and agreed to proceed.  History of Present Illness: 60 year old former smoker with multiple medical problems including sarcoidosis with pulmonary and renal manifestations, associate obstructive lung disease and chronic daytime and nocturnal hypoxemia without documented obstructive sleep apnea.  She has seizures, hypertension with diastolic dysfunction, diabetes, severe gastroparesis, suspected NASH.  She is profoundly weak and is largely homebound/bedbound.   Observations/Objective: She reports that overall her breathing is doing well. She rarely has to use albuterol. Some nasal congestion. Tried zyrtec but didn't help. Rarely uses benadryl.  She has been managed on albuterol as needed No rash. No vision changes.   Last CT chest 06/13/2017 showed stable bilateral scattered nodularity Last chest x-ray 05/08/2020 that showed cardiac enlargement without any evidence of active pulmonary disease Pulmonary function testing 09/11/2013 show mixed obstruction and restriction with a severe decrease in FEV1 at 53% predicted  Assessment and Plan: Sarcoidosis of lung ([place]) No changes in symptoms to suggest active sarcoid either in the long, skin, eyes.  Given her immobility and difficulty getting out I think it is okay for [place] to defer surveillance imaging, surveillance PFT.  If she changes clinically then we will have to push for these things.  Obstructive lung disease Continue to keep albuterol available if needed.  Using rarely.  Chronic respiratory failure (HCC) Continue oxygen at 3 L/min at  all times.    Follow Up Instructions: 6 months video visit   I discussed the assessment and treatment plan with the patient. The patient was provided an opportunity to ask questions and all were answered. The patient agreed with the plan and demonstrated an understanding of the instructions.   The patient was advised to call back or seek an in-person evaluation if the symptoms worsen or if the condition fails to improve as anticipated.  I provided 25 minutes of non-face-to-face time during this encounter.   Collene Gobble, MD

## 2021-02-01 ENCOUNTER — Other Ambulatory Visit (HOSPITAL_COMMUNITY): Payer: Self-pay

## 2021-02-01 MED ORDER — ATORVASTATIN CALCIUM 20 MG PO TABS
ORAL_TABLET | ORAL | 5 refills | Status: AC
  Filled 2021-05-02: qty 30, 30d supply, fill #0

## 2021-02-01 MED ORDER — ATORVASTATIN CALCIUM 20 MG PO TABS
ORAL_TABLET | ORAL | 0 refills | Status: DC
  Filled 2021-02-01: qty 90, 90d supply, fill #0

## 2021-02-03 ENCOUNTER — Other Ambulatory Visit (HOSPITAL_COMMUNITY): Payer: Self-pay

## 2021-02-03 MED ORDER — HYDROCODONE-ACETAMINOPHEN 10-325 MG PO TABS
ORAL_TABLET | ORAL | 0 refills | Status: AC
  Filled 2021-02-03: qty 120, 20d supply, fill #0
  Filled 2021-02-09: qty 120, 30d supply, fill #0

## 2021-02-09 ENCOUNTER — Other Ambulatory Visit (HOSPITAL_COMMUNITY): Payer: Self-pay

## 2021-02-15 MED FILL — Lacosamide Tab 200 MG: ORAL | 90 days supply | Qty: 180 | Fill #0 | Status: AC

## 2021-02-15 MED FILL — Spironolactone Tab 100 MG: ORAL | 90 days supply | Qty: 90 | Fill #0 | Status: AC

## 2021-02-15 MED FILL — Donepezil Hydrochloride Tab 10 MG: ORAL | 90 days supply | Qty: 90 | Fill #0 | Status: AC

## 2021-02-16 ENCOUNTER — Other Ambulatory Visit (HOSPITAL_COMMUNITY): Payer: Self-pay

## 2021-02-17 ENCOUNTER — Other Ambulatory Visit (HOSPITAL_COMMUNITY): Payer: Self-pay

## 2021-02-21 ENCOUNTER — Other Ambulatory Visit (HOSPITAL_COMMUNITY): Payer: Self-pay

## 2021-02-21 MED ORDER — TORSEMIDE 100 MG PO TABS
100.0000 mg | ORAL_TABLET | Freq: Every day | ORAL | 2 refills | Status: AC
  Filled 2021-02-21: qty 90, 90d supply, fill #0

## 2021-02-22 ENCOUNTER — Other Ambulatory Visit (HOSPITAL_COMMUNITY): Payer: Self-pay

## 2021-03-01 ENCOUNTER — Inpatient Hospital Stay (HOSPITAL_COMMUNITY)
Admission: EM | Admit: 2021-03-01 | Discharge: 2021-03-04 | DRG: 641 | Disposition: A | Discharge status: Home Health | Payer: Medicare Other | Attending: Family Medicine | Admitting: Family Medicine

## 2021-03-01 ENCOUNTER — Other Ambulatory Visit: Payer: Self-pay

## 2021-03-01 ENCOUNTER — Observation Stay (HOSPITAL_COMMUNITY): Payer: Medicare Other

## 2021-03-01 ENCOUNTER — Encounter (HOSPITAL_COMMUNITY): Payer: Self-pay

## 2021-03-01 DIAGNOSIS — D649 Anemia, unspecified: Secondary | ICD-10-CM | POA: Diagnosis not present

## 2021-03-01 DIAGNOSIS — R8281 Pyuria: Secondary | ICD-10-CM | POA: Diagnosis present

## 2021-03-01 DIAGNOSIS — E119 Type 2 diabetes mellitus without complications: Secondary | ICD-10-CM

## 2021-03-01 DIAGNOSIS — I5032 Chronic diastolic (congestive) heart failure: Secondary | ICD-10-CM

## 2021-03-01 DIAGNOSIS — Z882 Allergy status to sulfonamides status: Secondary | ICD-10-CM

## 2021-03-01 DIAGNOSIS — Z87898 Personal history of other specified conditions: Secondary | ICD-10-CM

## 2021-03-01 DIAGNOSIS — D631 Anemia in chronic kidney disease: Secondary | ICD-10-CM | POA: Diagnosis present

## 2021-03-01 DIAGNOSIS — Z87891 Personal history of nicotine dependence: Secondary | ICD-10-CM

## 2021-03-01 DIAGNOSIS — Z833 Family history of diabetes mellitus: Secondary | ICD-10-CM

## 2021-03-01 DIAGNOSIS — Z6841 Body Mass Index (BMI) 40.0 and over, adult: Secondary | ICD-10-CM

## 2021-03-01 DIAGNOSIS — E1143 Type 2 diabetes mellitus with diabetic autonomic (poly)neuropathy: Secondary | ICD-10-CM | POA: Diagnosis present

## 2021-03-01 DIAGNOSIS — R5381 Other malaise: Secondary | ICD-10-CM | POA: Diagnosis present

## 2021-03-01 DIAGNOSIS — N1831 Chronic kidney disease, stage 3a: Secondary | ICD-10-CM

## 2021-03-01 DIAGNOSIS — J9611 Chronic respiratory failure with hypoxia: Secondary | ICD-10-CM

## 2021-03-01 DIAGNOSIS — I130 Hypertensive heart and chronic kidney disease with heart failure and stage 1 through stage 4 chronic kidney disease, or unspecified chronic kidney disease: Secondary | ICD-10-CM | POA: Diagnosis present

## 2021-03-01 DIAGNOSIS — E785 Hyperlipidemia, unspecified: Secondary | ICD-10-CM | POA: Diagnosis present

## 2021-03-01 DIAGNOSIS — E875 Hyperkalemia: Principal | ICD-10-CM

## 2021-03-01 DIAGNOSIS — D860 Sarcoidosis of lung: Secondary | ICD-10-CM

## 2021-03-01 DIAGNOSIS — Z886 Allergy status to analgesic agent status: Secondary | ICD-10-CM

## 2021-03-01 DIAGNOSIS — E1122 Type 2 diabetes mellitus with diabetic chronic kidney disease: Secondary | ICD-10-CM | POA: Diagnosis present

## 2021-03-01 DIAGNOSIS — G40909 Epilepsy, unspecified, not intractable, without status epilepticus: Secondary | ICD-10-CM | POA: Diagnosis present

## 2021-03-01 DIAGNOSIS — N179 Acute kidney failure, unspecified: Secondary | ICD-10-CM | POA: Diagnosis not present

## 2021-03-01 DIAGNOSIS — Z8249 Family history of ischemic heart disease and other diseases of the circulatory system: Secondary | ICD-10-CM

## 2021-03-01 DIAGNOSIS — Z20822 Contact with and (suspected) exposure to covid-19: Secondary | ICD-10-CM | POA: Diagnosis present

## 2021-03-01 DIAGNOSIS — E669 Obesity, unspecified: Secondary | ICD-10-CM | POA: Diagnosis present

## 2021-03-01 DIAGNOSIS — Z888 Allergy status to other drugs, medicaments and biological substances status: Secondary | ICD-10-CM

## 2021-03-01 DIAGNOSIS — R809 Proteinuria, unspecified: Secondary | ICD-10-CM | POA: Diagnosis present

## 2021-03-01 DIAGNOSIS — K7469 Other cirrhosis of liver: Secondary | ICD-10-CM

## 2021-03-01 DIAGNOSIS — K3184 Gastroparesis: Secondary | ICD-10-CM | POA: Diagnosis present

## 2021-03-01 DIAGNOSIS — J9610 Chronic respiratory failure, unspecified whether with hypoxia or hypercapnia: Secondary | ICD-10-CM | POA: Diagnosis present

## 2021-03-01 DIAGNOSIS — K219 Gastro-esophageal reflux disease without esophagitis: Secondary | ICD-10-CM | POA: Diagnosis present

## 2021-03-01 DIAGNOSIS — F0390 Unspecified dementia without behavioral disturbance: Secondary | ICD-10-CM

## 2021-03-01 DIAGNOSIS — H53121 Transient visual loss, right eye: Secondary | ICD-10-CM | POA: Diagnosis present

## 2021-03-01 DIAGNOSIS — Z79899 Other long term (current) drug therapy: Secondary | ICD-10-CM

## 2021-03-01 DIAGNOSIS — I5189 Other ill-defined heart diseases: Secondary | ICD-10-CM

## 2021-03-01 DIAGNOSIS — Z9049 Acquired absence of other specified parts of digestive tract: Secondary | ICD-10-CM

## 2021-03-01 DIAGNOSIS — Z794 Long term (current) use of insulin: Secondary | ICD-10-CM

## 2021-03-01 LAB — BASIC METABOLIC PANEL
Anion gap: 11 (ref 5–15)
BUN: 95 mg/dL — ABNORMAL HIGH (ref 6–20)
CO2: 20 mmol/L — ABNORMAL LOW (ref 22–32)
Calcium: 8.8 mg/dL — ABNORMAL LOW (ref 8.9–10.3)
Chloride: 104 mmol/L (ref 98–111)
Creatinine, Ser: 3.13 mg/dL — ABNORMAL HIGH (ref 0.44–1.00)
GFR, Estimated: 17 mL/min — ABNORMAL LOW (ref >60)
Glucose, Bld: 144 mg/dL — ABNORMAL HIGH (ref 70–99)
Potassium: 6 mmol/L — ABNORMAL HIGH (ref 3.5–5.1)
Sodium: 135 mmol/L (ref 135–145)

## 2021-03-01 LAB — CBC WITH DIFFERENTIAL/PLATELET
Abs Immature Granulocytes: 0.03 10*3/uL (ref 0.00–0.07)
Basophils Absolute: 0 10*3/uL (ref 0.0–0.1)
Basophils Relative: 1 %
Eosinophils Absolute: 0.2 10*3/uL (ref 0.0–0.5)
Eosinophils Relative: 4 %
HCT: 25.7 % — ABNORMAL LOW (ref 36.0–46.0)
Hemoglobin: 8.3 g/dL — ABNORMAL LOW (ref 12.0–15.0)
Immature Granulocytes: 1 %
Lymphocytes Relative: 12 %
Lymphs Abs: 0.6 10*3/uL — ABNORMAL LOW (ref 0.7–4.0)
MCH: 29.6 pg (ref 26.0–34.0)
MCHC: 32.3 g/dL (ref 30.0–36.0)
MCV: 91.8 fL (ref 80.0–100.0)
Monocytes Absolute: 0.7 10*3/uL (ref 0.1–1.0)
Monocytes Relative: 14 %
Neutro Abs: 3.2 10*3/uL (ref 1.7–7.7)
Neutrophils Relative %: 68 %
Platelets: 184 10*3/uL (ref 150–400)
RBC: 2.8 MIL/uL — ABNORMAL LOW (ref 3.87–5.11)
RDW: 13.6 % (ref 11.5–15.5)
WBC: 4.6 10*3/uL (ref 4.0–10.5)
nRBC: 0 % (ref 0.0–0.2)

## 2021-03-01 LAB — URINALYSIS, ROUTINE W REFLEX MICROSCOPIC
Bilirubin Urine: NEGATIVE
Glucose, UA: 50 mg/dL — AB
Hgb urine dipstick: NEGATIVE
Ketones, ur: NEGATIVE mg/dL
Nitrite: NEGATIVE
Protein, ur: 100 mg/dL — AB
Specific Gravity, Urine: 1.011 (ref 1.005–1.030)
pH: 5 (ref 5.0–8.0)

## 2021-03-01 LAB — POTASSIUM: Potassium: 5.6 mmol/L — ABNORMAL HIGH (ref 3.5–5.1)

## 2021-03-01 LAB — FOLATE: Folate: 50.5 ng/mL (ref >5.9)

## 2021-03-01 LAB — IRON AND TIBC
Iron: 77 ug/dL (ref 28–170)
Saturation Ratios: 24 % (ref 10.4–31.8)
TIBC: 327 ug/dL (ref 250–450)
UIBC: 250 ug/dL

## 2021-03-01 LAB — VITAMIN B12: Vitamin B-12: 2934 pg/mL — ABNORMAL HIGH (ref 180–914)

## 2021-03-01 LAB — CREATININE, URINE, RANDOM: Creatinine, Urine: 39.42 mg/dL

## 2021-03-01 MED ORDER — DEXTROSE 50 % IV SOLN
1.0000 | Freq: Once | INTRAVENOUS | Status: AC
  Administered 2021-03-01: 50 mL via INTRAVENOUS
  Filled 2021-03-01: qty 50

## 2021-03-01 MED ORDER — SODIUM CHLORIDE 0.9 % IV BOLUS
1000.0000 mL | Freq: Once | INTRAVENOUS | Status: AC
  Administered 2021-03-01: 1000 mL via INTRAVENOUS

## 2021-03-01 MED ORDER — TIZANIDINE HCL 4 MG PO TABS
4.0000 mg | ORAL_TABLET | Freq: Three times a day (TID) | ORAL | Status: DC | PRN
  Administered 2021-03-02 – 2021-03-04 (×6): 4 mg via ORAL
  Filled 2021-03-01 (×6): qty 1

## 2021-03-01 MED ORDER — LACTATED RINGERS IV SOLN: INTRAVENOUS | Status: AC

## 2021-03-01 MED ORDER — VITAMIN B-12 1000 MCG PO TABS
1000.0000 ug | ORAL_TABLET | Freq: Every day | ORAL | Status: DC
  Administered 2021-03-03 – 2021-03-04 (×2): 1000 ug via ORAL
  Filled 2021-03-01 (×4): qty 1

## 2021-03-01 MED ORDER — DONEPEZIL HCL 10 MG PO TABS
10.0000 mg | ORAL_TABLET | Freq: Every day | ORAL | Status: DC
  Administered 2021-03-01 – 2021-03-03 (×3): 10 mg via ORAL
  Filled 2021-03-01: qty 1
  Filled 2021-03-01: qty 2
  Filled 2021-03-01: qty 1

## 2021-03-01 MED ORDER — ENOXAPARIN SODIUM 40 MG/0.4ML IJ SOSY: 40.0000 mg | PREFILLED_SYRINGE | INTRAMUSCULAR | Status: DC

## 2021-03-01 MED ORDER — ACETAMINOPHEN 325 MG PO TABS
650.0000 mg | ORAL_TABLET | Freq: Four times a day (QID) | ORAL | Status: DC | PRN
  Administered 2021-03-02: 650 mg via ORAL
  Filled 2021-03-01: qty 2

## 2021-03-01 MED ORDER — HEPARIN SODIUM (PORCINE) 5000 UNIT/ML IJ SOLN
5000.0000 [IU] | Freq: Three times a day (TID) | INTRAMUSCULAR | Status: DC
  Administered 2021-03-01 – 2021-03-04 (×8): 5000 [IU] via SUBCUTANEOUS
  Filled 2021-03-01 (×8): qty 1

## 2021-03-01 MED ORDER — SODIUM BICARBONATE 8.4 % IV SOLN
50.0000 meq | Freq: Once | INTRAVENOUS | Status: AC
  Administered 2021-03-01: 50 meq via INTRAVENOUS
  Filled 2021-03-01: qty 50

## 2021-03-01 MED ORDER — SODIUM ZIRCONIUM CYCLOSILICATE 5 G PO PACK
5.0000 g | PACK | Freq: Once | ORAL | Status: AC
  Administered 2021-03-01: 5 g via ORAL
  Filled 2021-03-01: qty 1

## 2021-03-01 MED ORDER — LACOSAMIDE 50 MG PO TABS
200.0000 mg | ORAL_TABLET | Freq: Two times a day (BID) | ORAL | Status: DC
  Administered 2021-03-01 – 2021-03-04 (×6): 200 mg via ORAL
  Filled 2021-03-01 (×6): qty 4

## 2021-03-01 MED ORDER — PROMETHAZINE HCL 25 MG PO TABS
25.0000 mg | ORAL_TABLET | Freq: Four times a day (QID) | ORAL | Status: DC | PRN
  Administered 2021-03-02 – 2021-03-04 (×4): 25 mg via ORAL
  Filled 2021-03-01 (×4): qty 1

## 2021-03-01 MED ORDER — HYDROCODONE-ACETAMINOPHEN 10-325 MG PO TABS
1.0000 | ORAL_TABLET | ORAL | Status: DC | PRN
  Administered 2021-03-01 – 2021-03-04 (×12): 1 via ORAL
  Filled 2021-03-01 (×12): qty 1

## 2021-03-01 MED ORDER — INSULIN ASPART 100 UNIT/ML IV SOLN
5.0000 [IU] | Freq: Once | INTRAVENOUS | Status: AC
  Administered 2021-03-01: 5 [IU] via INTRAVENOUS
  Filled 2021-03-01: qty 0.05

## 2021-03-01 MED ORDER — FOLIC ACID 1 MG PO TABS
1.0000 mg | ORAL_TABLET | Freq: Every day | ORAL | Status: DC
  Administered 2021-03-02 – 2021-03-04 (×3): 1 mg via ORAL
  Filled 2021-03-01 (×3): qty 1

## 2021-03-01 MED ORDER — CARVEDILOL 6.25 MG PO TABS
6.2500 mg | ORAL_TABLET | Freq: Two times a day (BID) | ORAL | Status: DC
  Administered 2021-03-02 – 2021-03-04 (×5): 6.25 mg via ORAL
  Filled 2021-03-01 (×5): qty 1

## 2021-03-01 MED ORDER — CALCIUM GLUCONATE-NACL 1-0.675 GM/50ML-% IV SOLN
1.0000 g | Freq: Once | INTRAVENOUS | Status: AC
  Administered 2021-03-01: 1000 mg via INTRAVENOUS
  Filled 2021-03-01: qty 50

## 2021-03-01 MED ORDER — ALBUTEROL SULFATE (2.5 MG/3ML) 0.083% IN NEBU: 2.5000 mg | INHALATION_SOLUTION | Freq: Four times a day (QID) | RESPIRATORY_TRACT | Status: DC | PRN

## 2021-03-01 MED ORDER — ATORVASTATIN CALCIUM 10 MG PO TABS
20.0000 mg | ORAL_TABLET | Freq: Every day | ORAL | Status: DC
  Administered 2021-03-01 – 2021-03-03 (×3): 20 mg via ORAL
  Filled 2021-03-01 (×3): qty 2

## 2021-03-01 NOTE — H&P (Signed)
History and Physical    Marissa Wilkerson INO:[id number] DOB: 03-13-1961 DOA: 03/01/2021  PCP: Merrilee Seashore, MD  Patient coming from: Home, husband at bedside  I have personally briefly reviewed patient's old medical records in [place]  Chief Complaint: hyperkalemia   HPI: Marissa Wilkerson is a 60 y.o. 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  Patient had home health draw lab work last week and told today that she had hyperkalemia of 6.3.  In the ER her repeat potassium was 6 with a creatinine significantly elevated to 3.13 from a baseline of around 1.3.  EKG showing peaked T waves to the inferior lateral leads.  She states that she was otherwise in her normal state of health.  She has history of gastroparesis with vomiting but states is actually been improved this week.  Denies any diarrhea.  No decrease in p.o. intake.  No decrease in urinary output.  She she takes daily torsemide and Aldactone.  She previously followed a nephrologist but they have since retired before the pandemic and she has not established with a new nephrologist.  Labs otherwise significant for anemia with hemoglobin 8.3 which is her baseline.  BG of 144.  She was given IV calcium gluconate, 5 units of NovoLog with D50, 1 amp of sodium bicarb and 1 L of normal saline fluid and hospitalist was called for admission.  Review of Systems: Constitutional: No Weight Change, No Fever ENT/Mouth: No sore throat, No Rhinorrhea Eyes: No Eye Pain, No Vision Changes Cardiovascular: No Chest Pain, no SOB Respiratory: No Cough, No Sputum Gastrointestinal: No Nausea, No Vomiting, No Diarrhea, No Constipation, No  Pain Genitourinary: no Urinary Incontinence, No Urgency, No Flank Pain Musculoskeletal: No Arthralgias, No Myalgias Skin: No Skin Lesions, No Pruritus, Neuro: no Weakness, No Numbness Psych: No Anxiety/Panic, No Depression, no decrease appetite Heme/Lymph: No Bruising, No Bleeding  Past Medical History:  Diagnosis Date   Anemia    05/04/15 hemglobin 8.8   Arthritis    Diabetes mellitus without complication (HCC)    insulin dependent, po meds   Diastolic dysfunction    followed by dr polite found on echo 06-12-13 , grade 1 per pt   Dyslipidemia    Edema of extremities    PITTING EDEMA      GERD (gastroesophageal reflux disease)    Hypertension    Sarcoidosis of lung ([place])    pulmonologist Dr.Byrum, [id number]    Shortness of breath    ON EXERTION , O2 2-3 l/min   Toxoplasmosis Treated at age 52.   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   Vertigo, benign positional    Wheezing    OCC    Past Surgical History:  Procedure Laterality Date   all teeth extraction  7-8 yrs ago   [place] N/A 08/05/2013   Procedure: COLONOSCOPY WITH PROPOFOL;  Surgeon: Garlan Fair, MD;  Location: [facility];  Service: Endoscopy;  Laterality: N/A;   ESOPHAGOGASTRODUODENOSCOPY (EGD) WITH PROPOFOL N/A 05/31/2015   Procedure: ESOPHAGOGASTRODUODENOSCOPY (EGD) WITH PROPOFOL;  Surgeon: Garlan Fair, MD;  Location: [facility];  Service: Endoscopy;  Laterality: N/A;   LUNG BIOPSY Left 03/05/2014   Procedure: LUNG BIOPSY;  Surgeon: Gaspar Bidding  Alveria Apley, MD;  Location: [facility];  Service: Thoracic;  Laterality: Left;   VIDEO ASSISTED THORACOSCOPY Left 03/05/2014   Procedure: VIDEO ASSISTED THORACOSCOPY;  Surgeon: Gaye Pollack, MD;  Location: [place];  Service: Thoracic;  Laterality: Left;   VIDEO BRONCHOSCOPY Bilateral 09/04/2013   Procedure: VIDEO BRONCHOSCOPY WITH FLUORO;  Surgeon: Collene Gobble, MD;  Location: [facility];  Service: Cardiopulmonary;  Laterality: Bilateral;    WISDOM TOOTH EXTRACTION  yrs ago     reports that she quit smoking about 8 years ago. Her smoking use included cigarettes. She has a 12.50 pack-year smoking history. She has never used smokeless tobacco. She reports current alcohol use. She reports that she does not use drugs. Social History  Allergies  Allergen Reactions   Cymbalta [Duloxetine Hcl] Other (See Comments)    Hallucinations    Gabapentin Other (See Comments)    Caused uncontrolled muscle movements and the patient did not know who she was   Metformin Nausea And Vomiting   Fentanyl Other (See Comments)    Blistered the skin   Metformin And Related Nausea And Vomiting   Mobic [Meloxicam] Nausea And Vomiting   Sulfa Antibiotics Rash   Voltaren [Diclofenac] Nausea And Vomiting    Family History  Problem Relation Age of Onset   Heart failure Mother    Diabetes Father    Hypertension Father    Diabetes Brother    Hypertension Brother      Prior to Admission medications   Medication Sig Start Date End Date Taking? Authorizing Provider  acetaminophen (TYLENOL) 325 MG tablet Take 2 tablets (650 mg total) by mouth every 6 (six) hours as needed for mild pain (or Fever >/= 101). 05/07/20  Yes Nita Sells, MD  albuterol (VENTOLIN HFA) 108 (90 Base) MCG/ACT inhaler INHALE 2 PUFFS INTO THE LUNGS EVERY 6 HOURS AS NEEDED FOR WHEEZING OR SHORTNESS OF BREATH. 06/21/20 06/21/21 Yes Lauraine Rinne, NP  atorvastatin (LIPITOR) 20 MG tablet Take 1 tablet by mouth daily Patient taking differently: Take 20 mg by mouth at bedtime. 02/01/21  Yes   carvedilol (COREG) 6.25 MG tablet TAKE 1 TABLET BY MOUTH TWICE DAILY WITH FOOD Patient taking differently: Take 6.25 mg by mouth 2 (two) times daily with a meal. 04/22/20 04/22/21 Yes Jani Gravel, MD  diphenhydrAMINE (BENADRYL) 25 MG tablet Take 25 mg by mouth every 6 (six) hours as needed for allergies.   Yes [provider]  donepezil (ARICEPT) 10 MG tablet TAKE 1 TABLET BY MOUTH  DAILY Patient taking differently: Take 10 mg by mouth at bedtime. 10/25/20 10/25/21 Yes Kerin Perna., MD  folic acid (FOLVITE) 782 MCG tablet Take 800 mcg by mouth in the morning.   Yes [provider]  HYDROcodone-acetaminophen (NORCO) 10-325 MG tablet Take 1 tablet by mouth every 4 - 6 hours as needed for pain.  do not fill before 02/09/21 Patient taking differently: Take 1 tablet by mouth every 4 (four) hours as needed (for pain). 02/03/21  Yes   lacosamide (VIMPAT) 200 MG TABS tablet TAKE 1 TABLET BY MOUTH 2 TIMES DAILY 10/25/20 05/18/21 Yes Kerin Perna., MD  metolazone (ZAROXOLYN) 2.5 MG tablet Take 2.5 mg by mouth daily as needed (for fluid or swelling).   Yes [provider]  promethazine (PHENERGAN) 25 MG tablet Take 1 tablet by mouth every 6 hours as needed for gastroparesis for 90 days. Patient taking differently: Take 25 mg by mouth every 6 (six) hours as needed (FOR GASTROPARESIS).  12/06/20  Yes   spironolactone (ALDACTONE) 100 MG tablet TAKE 1 TABLET BY MOUTH ONCE A DAY * NEEDS OFFICE VISIT Patient taking differently: Take 50 mg by mouth daily. 09/07/20 09/07/21 Yes Merrilee Seashore, MD  tiZANidine (ZANAFLEX) 4 MG tablet Take 1 tablet by mouth every 8 hours as needed for 90 days. Patient taking differently: Take 4 mg by mouth every 8 (eight) hours as needed for muscle spasms. 12/06/20  Yes   torsemide (DEMADEX) 100 MG tablet Take 1 tablet (100 mg total) by mouth daily. Patient taking differently: Take 100 mg by mouth in the morning. 02/21/21  Yes   vitamin B-12 (CYANOCOBALAMIN) 1000 MCG tablet Take 1 tablet (1,000 mcg total) by mouth daily. 01/09/20  Yes Florencia Reasons, MD  insulin lispro (HUMALOG) 100 UNIT/ML injection Before each meal 3 times a day, 140-199 - 2 units, 200-250 - 4 units, 251-299 - 6 units,  300-349 - 8 units,  350 or above 10 units. Insulin PEN if approved, provide syringes and needles if needed. Patient not taking: No sig reported 01/09/20   Florencia Reasons, MD   gabapentin (NEURONTIN) 100 MG capsule Take 1 capsule by mouth at bedtime for 7 days then 1 capsule by mouth 2 times a day for 7 days then increase to 3 capsules daily 12/09/20 01/06/21      Physical Exam: Vitals:   03/01/21 1815 03/01/21 1830 03/01/21 1900 03/01/21 1915  BP: (!) 150/54 95/61 (!) 168/67 (!) 165/60  Pulse: 63 72 70 74  Resp: 12 13 13 14   Temp:      TempSrc:      SpO2: 100% 100% 100% 98%    Constitutional: NAD, calm, comfortable, obese female laying flat in bed Vitals:   03/01/21 1815 03/01/21 1830 03/01/21 1900 03/01/21 1915  BP: (!) 150/54 95/61 (!) 168/67 (!) 165/60  Pulse: 63 72 70 74  Resp: 12 13 13 14   Temp:      TempSrc:      SpO2: 100% 100% 100% 98%   Eyes: PERRL, lids and conjunctivae normal ENMT: Mucous membranes are moist.  Neck: normal, supple Respiratory: clear to auscultation bilaterally, no wheezing, no crackles. Normal respiratory effort on home 3L O2. No accessory muscle use.  Cardiovascular: Regular rate and rhythm, no murmurs / rubs / gallops. No extremity edema. 2+ pedal pulses.  Abdomen: no tenderness, no masses palpated. . Bowel sounds positive.  Musculoskeletal: no clubbing / cyanosis. No joint deformity upper and lower extremities. Good ROM, no contractures. Normal muscle tone.  Skin: no rashes, lesions, ulcers. No induration Neurologic: CN 2-12 grossly intact. Sensation intact,  Strength 4/5 in bilateral LE Psychiatric: Normal judgment and insight. Alert and oriented x 3. Normal mood.     Labs on Admission: I have personally reviewed following labs and imaging studies  CBC: Recent Labs  Lab 03/01/21 1649  WBC 4.6  NEUTROABS 3.2  HGB 8.3*  HCT 25.7*  MCV 91.8  PLT 370   Basic Metabolic Panel: Recent Labs  Lab 03/01/21 1510  NA 135  K 6.0*  CL 104  CO2 20*  GLUCOSE 144*  BUN 95*  CREATININE 3.13*  CALCIUM 8.8*   GFR: CrCl cannot be calculated (Unknown ideal weight.). Liver Function Tests: No results for input(s):  AST, ALT, ALKPHOS, BILITOT, PROT, ALBUMIN in the last 168 hours. No results for input(s): LIPASE, AMYLASE in the last 168 hours. No results for input(s): AMMONIA in the last 168 hours. Coagulation Profile: No results for input(s): INR, PROTIME in  the last 168 hours. Cardiac Enzymes: No results for input(s): CKTOTAL, CKMB, CKMBINDEX, TROPONINI in the last 168 hours. BNP (last 3 results) No results for input(s): PROBNP in the last 8760 hours. HbA1C: No results for input(s): HGBA1C in the last 72 hours. CBG: No results for input(s): GLUCAP in the last 168 hours. Lipid Profile: No results for input(s): CHOL, HDL, LDLCALC, TRIG, CHOLHDL, LDLDIRECT in the last 72 hours. Thyroid Function Tests: No results for input(s): TSH, T4TOTAL, FREET4, T3FREE, THYROIDAB in the last 72 hours. Anemia Panel: No results for input(s): VITAMINB12, FOLATE, FERRITIN, TIBC, IRON, RETICCTPCT in the last 72 hours. Urine analysis:    Component Value Date/Time   COLORURINE AMBER (A) 04/24/2020 1407   APPEARANCEUR CLOUDY (A) 04/24/2020 1407   LABSPEC 1.017 04/24/2020 1407   PHURINE 5.0 04/24/2020 1407   GLUCOSEU NEGATIVE 04/24/2020 1407   HGBUR MODERATE (A) 04/24/2020 1407   BILIRUBINUR NEGATIVE 04/24/2020 1407   KETONESUR NEGATIVE 04/24/2020 1407   PROTEINUR >=300 (A) 04/24/2020 1407   UROBILINOGEN 1.0 03/03/2014 0942   NITRITE NEGATIVE 04/24/2020 1407   LEUKOCYTESUR MODERATE (A) 04/24/2020 1407    Radiological Exams on Admission: No results found.    Assessment/Plan  Hyperkalemia secondary to worsening renal insufficiency -Admit on K of 6. Will repeat after receiving IV calcium gluconate, insulin/D50 and bicarb in the ED.  - Lokelma x 1   AKI on CKD stage IIIa -Suspect she could be over diuresed with both torsemide 100mg  and Aldactone 50mg .  She has already taken doses of both today.  We will hold tomorrow. -Obtain renal ultrasound -FeUrea and UA  - IV 50cc/hr LR fluid until the  morning  Anemia -Hgb at baseline of 8  -has hx requiring blood transfusion in 04/2020 but FOBT negative -check iron panel, vitamin B12 and folate -continue home folate and vitamin B12  Pulmonary sarcoidosis with chronic hypoxemia -Continue home 3 L  Chronic diastolic heart failure -Monitor fluid status closely while diuretics and on IV fluids -Continue Coreg  Cirrhosis -holding aldactone due to hyperkalemia  History of seizures -Follows with nephrology at [facility].  Has not had a tonic-clonic seizure many years -Continue Vimpat  Type 2 diabetes with gastroparesis -Controlled.  Can add sliding scale as needed.  Dementia  -contine Aricept  Debility -Patient is mostly bedbound and husband is caretaker   DVT prophylaxis:.heparin SQ Code Status: Full Family Communication: Plan discussed with patient and husband at bedside  disposition Plan: Home with observation Consults called:  Admission status: Observation   Level of care: Telemetry  Status is: Observation  The patient remains OBS appropriate and will d/c before 2 midnights.  Dispo: The patient is from: Home              Anticipated d/c is to: Home              Patient currently is not medically stable to d/c.   Difficult to place patient No         Orene Desanctis DO [facility]   If 7PM-7AM, please contact night-coverage [email or web address]   03/01/2021, 7:59 PM

## 2021-03-01 NOTE — ED Triage Notes (Signed)
Per EMS pt complaining due to high potassium levels. Pt labs were draw last week. Pt presents document that shows K at 6.3  BP 150P, HR 57, Axox4, RR 18, 3L [place] at baseline 96%

## 2021-03-01 NOTE — ED Notes (Signed)
Patient placed on a Pure Wick at her request.

## 2021-03-01 NOTE — ED Provider Notes (Signed)
[place] DEPT Provider Note   CSN: [id number] Arrival date & time: 03/01/21  1410     History Chief Complaint  Patient presents with  . Abnormal Lab    K 6.3    Marissa Wilkerson is a 60 y.o. female.  Patient here because her doctor checked her potassium and it was elevated.  Patient also has a history of congestive heart failure and is on Demadex.  Patient claims mild weakness  The history is provided by the patient. No language interpreter was used.  Weakness Severity:  Mild Onset quality:  Gradual Timing:  Constant Progression:  Unchanged Chronicity:  Recurrent Context: not alcohol use   Relieved by:  Nothing Worsened by:  Nothing Ineffective treatments:  None tried Associated symptoms: no abdominal pain, no chest pain, no cough, no diarrhea, no frequency, no headaches and no seizures       Past Medical History:  Diagnosis Date  . Anemia    05/04/15 hemglobin 8.8  . Arthritis   . Diabetes mellitus without complication (HCC)    insulin dependent, po meds  . Diastolic dysfunction    followed by dr polite found on echo 06-12-13 , grade 1 per pt  . Dyslipidemia   . Edema of extremities    PITTING EDEMA     . GERD (gastroesophageal reflux disease)   . Hypertension   . 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 Shortness of breath    ON EXERTION , O2 2-3 l/min  . 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 Vertigo, benign positional   . Wheezing    OCC    Patient Active Problem List   Diagnosis Date Noted  . Symptomatic anemia 05/08/2020  . Atypical chest pain 05/08/2020  . Positive D-dimer 05/08/2020  . Hyponatremia 05/08/2020  . Other cirrhosis of liver ([place]) 05/08/2020  . Pressure injury of skin 04/30/2020  . Weakness 01/05/2020  . History of seizure 01/05/2020  . Snoring 04/12/2018  . Chronic rhinitis 04/12/2018  . Nausea and vomiting 10/17/2017  . Acute renal failure  superimposed on stage 3a chronic kidney disease ([place]) 10/16/2017  . Chronic diastolic CHF (congestive heart failure) ([place]) 09/15/2017  . Hyperkalemia 09/15/2017  . CHF (congestive heart failure) ([place]) 09/15/2017  . Hyperlipidemia 08/08/2016  . Bilateral carotid artery disease ([place]) 08/08/2016  . Lower extremity weakness 02/22/2016  . Multiple lung nodules 03/05/2014  . Obstructive lung disease ([place]) 02/09/2014  . Chronic respiratory failure ([place]) 09/02/2013  . Sarcoidosis of lung ([place]) 09/02/2013  . Hypertension   . Diabetes mellitus without complication ([place])   . Diastolic dysfunction     Past Surgical History:  Procedure Laterality Date  . all teeth extraction  7-8 yrs ago  . CHOLECYSTECTOMY  1996  . COLONOSCOPY WITH PROPOFOL N/A 08/05/2013   Procedure: COLONOSCOPY WITH PROPOFOL;  Surgeon: Garlan Fair, MD;  Location: [facility];  Service: Endoscopy;  Laterality: N/A;  . ESOPHAGOGASTRODUODENOSCOPY (EGD) WITH PROPOFOL N/A 05/31/2015   Procedure: ESOPHAGOGASTRODUODENOSCOPY (EGD) WITH PROPOFOL;  Surgeon: Garlan Fair, MD;  Location: [facility];  Service: Endoscopy;  Laterality: N/A;  . LUNG BIOPSY Left 03/05/2014   Procedure: LUNG BIOPSY;  Surgeon: Gaye Pollack, MD;  Location: [place];  Service: Thoracic;  Laterality: Left;  [name] VIDEO ASSISTED THORACOSCOPY Left 03/05/2014   Procedure: VIDEO ASSISTED THORACOSCOPY;  Surgeon: Gaye Pollack, MD;  Location: [place];  Service: Thoracic;  Laterality: Left;  [name] VIDEO BRONCHOSCOPY Bilateral 09/04/2013   Procedure: VIDEO BRONCHOSCOPY WITH FLUORO;  Surgeon: Collene Gobble, MD;  Location: [facility];  Service: Cardiopulmonary;  Laterality: Bilateral;  . WISDOM TOOTH EXTRACTION  yrs ago     OB History   No obstetric history on file.     Family History  Problem Relation Age of Onset  . Heart failure Mother   . Diabetes Father   . Hypertension Father   . Diabetes Brother   . Hypertension Brother     Social History   Tobacco Use  .  Smoking status: Former    Packs/day: 0.50    Years: 25.00    Pack years: 12.50    Types: Cigarettes    Quit date: 08/21/2012    Years since quitting: 8.5  . Smokeless tobacco: Never  Substance Use Topics  . Alcohol use: Yes    Comment: very rare  . Drug use: No    Home Medications Prior to Admission medications   Medication Sig Start Date End Date Taking? Authorizing Provider  acetaminophen (TYLENOL) 325 MG tablet Take 2 tablets (650 mg total) by mouth every 6 (six) hours as needed for mild pain (or Fever >/= 101). 05/07/20  Yes Nita Sells, MD  albuterol (VENTOLIN HFA) 108 (90 Base) MCG/ACT inhaler INHALE 2 PUFFS INTO THE LUNGS EVERY 6 HOURS AS NEEDED FOR WHEEZING OR SHORTNESS OF BREATH. 06/21/20 06/21/21 Yes Lauraine Rinne, NP  atorvastatin (LIPITOR) 20 MG tablet Take 1 tablet by mouth daily Patient taking differently: Take 20 mg by mouth at bedtime. 02/01/21  Yes   carvedilol (COREG) 6.25 MG tablet TAKE 1 TABLET BY MOUTH TWICE DAILY WITH FOOD Patient taking differently: Take 6.25 mg by mouth 2 (two) times daily with a meal. 04/22/20 04/22/21 Yes Jani Gravel, MD  diphenhydrAMINE (BENADRYL) 25 MG tablet Take 25 mg by mouth every 6 (six) hours as needed for allergies.   Yes [provider]  donepezil (ARICEPT) 10 MG tablet TAKE 1 TABLET BY MOUTH DAILY Patient taking differently: Take 10 mg by mouth at bedtime. 10/25/20 10/25/21 Yes Kerin Perna., MD  folic acid (FOLVITE) 144 MCG tablet Take 800 mcg by mouth in the morning.   Yes [provider]  HYDROcodone-acetaminophen (NORCO) 10-325 MG tablet Take 1 tablet by mouth every 4 - 6 hours as needed for pain.  do not fill before 02/09/21 Patient taking differently: Take 1 tablet by mouth every 4 (four) hours as needed (for pain). 02/03/21  Yes   lacosamide (VIMPAT) 200 MG TABS tablet TAKE 1 TABLET BY MOUTH 2 TIMES DAILY 10/25/20 05/18/21 Yes Kerin Perna., MD  metolazone (ZAROXOLYN) 2.5 MG tablet Take 2.5 mg by mouth  daily as needed (for fluid or swelling).   Yes [provider]  promethazine (PHENERGAN) 25 MG tablet Take 1 tablet by mouth every 6 hours as needed for gastroparesis for 90 days. Patient taking differently: Take 25 mg by mouth every 6 (six) hours as needed (FOR GASTROPARESIS). 12/06/20  Yes   spironolactone (ALDACTONE) 100 MG tablet TAKE 1 TABLET BY MOUTH ONCE A DAY * NEEDS OFFICE VISIT Patient taking differently: Take 50 mg by mouth daily. 09/07/20 09/07/21 Yes Merrilee Seashore, MD  tiZANidine (ZANAFLEX) 4 MG tablet Take 1 tablet by mouth every 8 hours as needed for 90 days. Patient taking differently: Take 4 mg by mouth every 8 (eight) hours as needed for muscle spasms. 12/06/20  Yes   torsemide (DEMADEX) 100 MG tablet  Take 1 tablet (100 mg total) by mouth daily. Patient taking differently: Take 100 mg by mouth in the morning. 02/21/21  Yes   vitamin B-12 (CYANOCOBALAMIN) 1000 MCG tablet Take 1 tablet (1,000 mcg total) by mouth daily. 01/09/20  Yes Florencia Reasons, MD  insulin lispro (HUMALOG) 100 UNIT/ML injection Before each meal 3 times a day, 140-199 - 2 units, 200-250 - 4 units, 251-299 - 6 units,  300-349 - 8 units,  350 or above 10 units. Insulin PEN if approved, provide syringes and needles if needed. Patient not taking: No sig reported 01/09/20   Florencia Reasons, MD  gabapentin (NEURONTIN) 100 MG capsule Take 1 capsule by mouth at bedtime for 7 days then 1 capsule by mouth 2 times a day for 7 days then increase to 3 capsules daily 12/09/20 01/06/21      Allergies    Cymbalta [duloxetine hcl], Gabapentin, Metformin, Fentanyl, Metformin and related, Mobic [meloxicam], Sulfa antibiotics, and Voltaren [diclofenac]  Review of Systems   Review of Systems  Constitutional:  Negative for appetite change and fatigue.  HENT:  Negative for congestion, ear discharge and sinus pressure.   Eyes:  Negative for discharge.  Respiratory:  Negative for cough.   Cardiovascular:  Negative for chest pain.   Gastrointestinal:  Negative for abdominal pain and diarrhea.  Genitourinary:  Negative for frequency and hematuria.  Musculoskeletal:  Negative for back pain.  Skin:  Negative for rash.  Neurological:  Positive for weakness. Negative for seizures and headaches.  Psychiatric/Behavioral:  Negative for hallucinations.    Physical Exam Updated Vital Signs BP (!) 165/60   Pulse 74   Temp 98.5 F (36.9 C) (Oral)   Resp 14   LMP 08/28/2008   SpO2 98%   Physical Exam Vitals and nursing note reviewed.  Constitutional:      Appearance: She is well-developed.  HENT:     Head: Normocephalic.     Nose: Nose normal.  Eyes:     General: No scleral icterus.    Conjunctiva/sclera: Conjunctivae normal.  Neck:     Thyroid: No thyromegaly.  Cardiovascular:     Rate and Rhythm: Normal rate and regular rhythm.     Heart sounds: No murmur heard.   No friction rub. No gallop.  Pulmonary:     Breath sounds: No stridor. No wheezing or rales.  Chest:     Chest wall: No tenderness.  Abdominal:     General: There is no distension.     Tenderness: There is no abdominal tenderness. There is no rebound.  Musculoskeletal:        General: Normal range of motion.     Cervical back: Neck supple.  Lymphadenopathy:     Cervical: No cervical adenopathy.  Skin:    Findings: No erythema or rash.  Neurological:     Mental Status: She is alert and oriented to person, place, and time.     Motor: No abnormal muscle tone.     Coordination: Coordination normal.  Psychiatric:        Behavior: Behavior normal.    ED Results / Procedures / Treatments   Labs (all labs ordered are listed, but only abnormal results are displayed) Labs Reviewed  BASIC METABOLIC PANEL - Abnormal; Notable for the following components:      Result Value   Potassium 6.0 (*)    CO2 20 (*)    Glucose, Bld 144 (*)    BUN 95 (*)    Creatinine, Ser 3.13 (*)  Calcium 8.8 (*)    GFR, Estimated 17 (*)    All other components  within normal limits  CBC WITH DIFFERENTIAL/PLATELET - Abnormal; Notable for the following components:   RBC 2.80 (*)    Hemoglobin 8.3 (*)    HCT 25.7 (*)    Lymphs Abs 0.6 (*)    All other components within normal limits  BASIC METABOLIC PANEL  POTASSIUM    EKG EKG Interpretation  Date/Time:  Tuesday March 01 2021 15:25:13 EDT Ventricular Rate:  55 PR Interval:  178 QRS Duration: 125 QT Interval:  429 QTC Calculation: 411 R Axis:   78 Text Interpretation: Sinus rhythm Atrial premature complex Nonspecific intraventricular conduction delay Minimal ST elevation, anterior leads Confirmed by Milton Ferguson [phone]) on 03/01/2021 4:42:39 PM  Radiology No results found.  Procedures Procedures   Medications Ordered in ED Medications  enoxaparin (LOVENOX) injection 40 mg (has no administration in time range)  sodium chloride 0.9 % bolus 1,000 mL (1,000 mLs Intravenous New Bag/Given 03/01/21 1706)  sodium bicarbonate injection 50 mEq (50 mEq Intravenous Given 03/01/21 1706)  calcium gluconate 1 g/ 50 mL sodium chloride IVPB (0 g Intravenous Stopped 03/01/21 1902)  insulin aspart (novoLOG) injection 5 Units (5 Units Intravenous Given 03/01/21 1707)    And  dextrose 50 % solution 50 mL (50 mLs Intravenous Given 03/01/21 1706)    ED Course  I have reviewed the triage vital signs and the nursing notes.  Pertinent labs & imaging results that were available during my care of the patient were reviewed by me and considered in my medical decision making (see chart for details).   CRITICAL CARE Performed by: Milton Ferguson Total critical care time: 40 minutes Critical care time was exclusive of separately billable procedures and treating other patients. Critical care was necessary to treat or prevent imminent or life-threatening deterioration. Critical care was time spent personally by me on the following activities: development of treatment plan with patient and/or surrogate as well as nursing,  discussions with consultants, evaluation of patient's response to treatment, examination of patient, obtaining history from patient or surrogate, ordering and performing treatments and interventions, ordering and review of laboratory studies, ordering and review of radiographic studies, pulse oximetry and re-evaluation of patient's condition. Patient with hyperkalemia and AKI.  She has been treated with fluids bicarb calcium and insulin and glucose. MDM Rules/Calculators/A&P                          Patient will be admitted for hyperkalemia and AKI Final Clinical Impression(s) / ED Diagnoses Final diagnoses:  Hyperkalemia  AKI (acute kidney injury) [facility])    Rx / [place] Orders ED Discharge Orders     None        Milton Ferguson, MD 03/01/21 [phone]

## 2021-03-02 DIAGNOSIS — Z6841 Body Mass Index (BMI) 40.0 and over, adult: Secondary | ICD-10-CM | POA: Diagnosis not present

## 2021-03-02 DIAGNOSIS — Z20822 Contact with and (suspected) exposure to covid-19: Secondary | ICD-10-CM | POA: Diagnosis present

## 2021-03-02 DIAGNOSIS — I5032 Chronic diastolic (congestive) heart failure: Secondary | ICD-10-CM | POA: Diagnosis present

## 2021-03-02 DIAGNOSIS — R8281 Pyuria: Secondary | ICD-10-CM | POA: Diagnosis present

## 2021-03-02 DIAGNOSIS — E1143 Type 2 diabetes mellitus with diabetic autonomic (poly)neuropathy: Secondary | ICD-10-CM | POA: Diagnosis present

## 2021-03-02 DIAGNOSIS — I130 Hypertensive heart and chronic kidney disease with heart failure and stage 1 through stage 4 chronic kidney disease, or unspecified chronic kidney disease: Secondary | ICD-10-CM | POA: Diagnosis present

## 2021-03-02 DIAGNOSIS — R809 Proteinuria, unspecified: Secondary | ICD-10-CM | POA: Diagnosis present

## 2021-03-02 DIAGNOSIS — R5381 Other malaise: Secondary | ICD-10-CM | POA: Diagnosis present

## 2021-03-02 DIAGNOSIS — K3184 Gastroparesis: Secondary | ICD-10-CM | POA: Diagnosis present

## 2021-03-02 DIAGNOSIS — E875 Hyperkalemia: Secondary | ICD-10-CM | POA: Diagnosis present

## 2021-03-02 DIAGNOSIS — H53121 Transient visual loss, right eye: Secondary | ICD-10-CM | POA: Diagnosis present

## 2021-03-02 DIAGNOSIS — D631 Anemia in chronic kidney disease: Secondary | ICD-10-CM | POA: Diagnosis present

## 2021-03-02 DIAGNOSIS — N179 Acute kidney failure, unspecified: Secondary | ICD-10-CM | POA: Diagnosis present

## 2021-03-02 DIAGNOSIS — F0390 Unspecified dementia without behavioral disturbance: Secondary | ICD-10-CM | POA: Diagnosis present

## 2021-03-02 DIAGNOSIS — E669 Obesity, unspecified: Secondary | ICD-10-CM | POA: Diagnosis present

## 2021-03-02 DIAGNOSIS — G40909 Epilepsy, unspecified, not intractable, without status epilepticus: Secondary | ICD-10-CM | POA: Diagnosis present

## 2021-03-02 DIAGNOSIS — J9611 Chronic respiratory failure with hypoxia: Secondary | ICD-10-CM | POA: Diagnosis present

## 2021-03-02 DIAGNOSIS — N1831 Chronic kidney disease, stage 3a: Secondary | ICD-10-CM | POA: Diagnosis present

## 2021-03-02 DIAGNOSIS — Z882 Allergy status to sulfonamides status: Secondary | ICD-10-CM | POA: Diagnosis not present

## 2021-03-02 DIAGNOSIS — D860 Sarcoidosis of lung: Secondary | ICD-10-CM | POA: Diagnosis present

## 2021-03-02 DIAGNOSIS — E1122 Type 2 diabetes mellitus with diabetic chronic kidney disease: Secondary | ICD-10-CM | POA: Diagnosis present

## 2021-03-02 DIAGNOSIS — Z886 Allergy status to analgesic agent status: Secondary | ICD-10-CM | POA: Diagnosis not present

## 2021-03-02 DIAGNOSIS — K7469 Other cirrhosis of liver: Secondary | ICD-10-CM | POA: Diagnosis present

## 2021-03-02 DIAGNOSIS — Z888 Allergy status to other drugs, medicaments and biological substances status: Secondary | ICD-10-CM | POA: Diagnosis not present

## 2021-03-02 LAB — BASIC METABOLIC PANEL
Anion gap: 8 (ref 5–15)
Anion gap: 7 (ref 5–15)
BUN: 80 mg/dL — ABNORMAL HIGH (ref 6–20)
BUN: 78 mg/dL — ABNORMAL HIGH (ref 6–20)
CO2: 23 mmol/L (ref 22–32)
CO2: 24 mmol/L (ref 22–32)
Calcium: 8.7 mg/dL — ABNORMAL LOW (ref 8.9–10.3)
Calcium: 8.4 mg/dL — ABNORMAL LOW (ref 8.9–10.3)
Chloride: 104 mmol/L (ref 98–111)
Chloride: 103 mmol/L (ref 98–111)
Creatinine, Ser: 2.68 mg/dL — ABNORMAL HIGH (ref 0.44–1.00)
Creatinine, Ser: 2.73 mg/dL — ABNORMAL HIGH (ref 0.44–1.00)
GFR, Estimated: 20 mL/min — ABNORMAL LOW (ref >60)
GFR, Estimated: 20 mL/min — ABNORMAL LOW (ref >60)
Glucose, Bld: 106 mg/dL — ABNORMAL HIGH (ref 70–99)
Glucose, Bld: 141 mg/dL — ABNORMAL HIGH (ref 70–99)
Potassium: 5.4 mmol/L — ABNORMAL HIGH (ref 3.5–5.1)
Potassium: 5 mmol/L (ref 3.5–5.1)
Sodium: 135 mmol/L (ref 135–145)
Sodium: 134 mmol/L — ABNORMAL LOW (ref 135–145)

## 2021-03-02 LAB — GLUCOSE, CAPILLARY: Glucose-Capillary: 100 mg/dL — ABNORMAL HIGH (ref 70–99)

## 2021-03-02 LAB — SARS CORONAVIRUS 2 (TAT 6-24 HRS): SARS Coronavirus 2: NEGATIVE

## 2021-03-02 MED ORDER — SODIUM CHLORIDE 0.9 % IV SOLN: INTRAVENOUS | Status: AC

## 2021-03-02 MED ORDER — AMLODIPINE BESYLATE 5 MG PO TABS
5.0000 mg | ORAL_TABLET | Freq: Every day | ORAL | Status: DC
  Administered 2021-03-02 – 2021-03-04 (×3): 5 mg via ORAL
  Filled 2021-03-02 (×3): qty 1

## 2021-03-02 MED ORDER — SODIUM CHLORIDE 0.9 % IV SOLN: INTRAVENOUS | Status: DC

## 2021-03-02 MED ORDER — LACTATED RINGERS IV SOLN: INTRAVENOUS | Status: DC

## 2021-03-02 MED ORDER — SODIUM ZIRCONIUM CYCLOSILICATE 5 G PO PACK
5.0000 g | PACK | Freq: Once | ORAL | Status: AC
  Administered 2021-03-02: 5 g via ORAL
  Filled 2021-03-02: qty 1

## 2021-03-02 NOTE — Progress Notes (Signed)
PROGRESS NOTE  ZONA PEDRO  IDP:[id number] DOB: [birth year] DOA: 03/01/2021 PCP: Merrilee Seashore, MD  Outpatient Specialists: Nephrology, [facility] (Formerly Dr. Lorrene Reid) Brief Narrative: Marissa Wilkerson is a 60 y.o. 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 Findings confirmed in ED with ECG demonstrating peaked T waves. Dextrose/insulin, bicarbonate, 1L NS, and IV calcium gluconate were given and admission requested. Lokelma added with modest improvement but remains abnormal.   Assessment & Plan: Principal Problem:   Hyperkalemia Active Problems:   Chronic respiratory failure (HCC)   Diabetes mellitus without complication (HCC)   Diastolic dysfunction   Sarcoidosis of lung (HCC)   Chronic diastolic CHF (congestive heart failure) (HCC)   Acute renal failure superimposed on stage 3a chronic kidney disease (HCC)   History of seizure   Other cirrhosis of liver (HCC)   Anemia   Dementia (HCC)  Hyperkalemic AKI on stage IIIa CKD:  - Hold spironolactone w/hyperK as well as torsemide w/AKI.  - Change LR to NS given hyperK - Repeat lokelma this AM, recheck in PM to see where we're at. - Continue telemetry - Renal U/S unremarkable, pt with known proteinuria on UA, pyuria, and hyaline casts. Will continue IVF and serial metabolic panels.  - Will need to reestablish with [facility] after discharge.   Anemia of CKD: At baseline.  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  - Restart home meds once euvolemic.   Seizure disorder - Continue vimpat and [facility] follow up   Type 2 diabetes with gastroparesis:  - SSI prn   Dementia: she is currently oriented. ?attributed to sequelae of seizure, has had formal testing to establish diagnosis.  - Continue aricept   Debility - Patient is mostly bedbound and husband is caretaker. She's at baseline and has refused returning to SNF (spent < 1 day at [facility] in the past). Will continue [place] [facility].   Obesity: Estimated body mass index is 43.58 kg/m as calculated from the following:   Height as of 05/08/20: 5\' 3"  (1.6 m).   Weight as of 05/09/20: 111.6 kg.  DVT prophylaxis: Heparin Code Status: Full Family Communication: Husband at bedside Disposition Plan:  Status is: Inpatient  Remains inpatient appropriate because:Persistent severe electrolyte disturbances  Dispo: The patient is from: Home              Anticipated d/c is to: Home              Patient currently is not medically stable to d/c.   Difficult to place patient No  Consultants:  None  Procedures:  None  Antimicrobials: None   Subjective: Confirms she feels well. Without the labs she would not have any complaints. Specifically no urinary complaints. 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   Objective: Vitals:   03/02/21 1200 03/02/21 1231 03/02/21 1317 03/02/21 1351  BP: (!) 171/66  (!) 169/65 (!) 173/62  Pulse: 79  76 68  Resp: 15  16 18   Temp:  99.6 F (37.6 C) (!) 100.5 F (38.1 C) 98.1 F (36.7 C)  TempSrc:  Oral Oral Oral  SpO2: 100%  100% 100%    Intake/Output Summary (Last 24 hours) at 03/02/2021 1653 Last data filed at 03/02/2021 1232 Gross per 24 hour  Intake 1050 ml  Output 1 ml  Net 1049 ml   Gen: 60 y.o. female in no distress  Pulm: Non-labored breathing supplemental O2. Clear to auscultation bilaterally. Slight  crackles at bases.  CV: Regular rate and rhythm. No murmur, rub, or gallop. No JVD, no pitting pedal edema. GI: Abdomen soft, non-tender, non-distended, with normoactive bowel sounds. No organomegaly or masses felt. Ext: Warm, no deformities Skin: No rashes, lesions or ulcers Neuro: Alert and oriented. No focal neurological deficits. Psych: Judgement and insight appear normal. Mood & affect appropriate.   Data Reviewed: I have personally reviewed following labs and imaging studies  CBC: Recent Labs  Lab 03/01/21 1649  WBC 4.6  NEUTROABS 3.2  HGB 8.3*  HCT 25.7*  MCV 91.8  PLT 956   Basic Metabolic Panel: Recent Labs  Lab 03/01/21 1510 03/01/21 1936 03/02/21 0408 03/02/21 1428  NA 135  --  135 134*  K 6.0* 5.6* 5.4* 5.0  CL 104  --  104 103  CO2 20*  --  23 24  GLUCOSE 144*  --  106* 141*  BUN 95*  --  80* 78*  CREATININE 3.13*  --  2.68* 2.73*  CALCIUM 8.8*  --  8.7* 8.4*   GFR: CrCl cannot be calculated (Unknown ideal weight.). Liver Function Tests: No results for input(s): AST, ALT, ALKPHOS, BILITOT, PROT, ALBUMIN in the last 168 hours. No results for input(s): LIPASE, AMYLASE in the last 168 hours. No results for input(s): AMMONIA in the last 168 hours. Coagulation Profile: No results for input(s): INR, PROTIME in the last 168 hours. Cardiac Enzymes: No results for input(s): CKTOTAL, CKMB, CKMBINDEX, TROPONINI in the last 168 hours. BNP (last 3 results) No results for input(s): PROBNP in the last 8760 hours. HbA1C: No results for input(s): HGBA1C in the last 72 hours. CBG: No results for input(s): GLUCAP in the last 168 hours. Lipid Profile: No results for input(s): CHOL, HDL, LDLCALC, TRIG, CHOLHDL, LDLDIRECT in the last 72 hours. Thyroid Function Tests: No results for input(s): TSH, T4TOTAL, FREET4, T3FREE, THYROIDAB in the last 72 hours. Anemia Panel: Recent Labs    03/01/21 2100  VITAMINB12 2,934*  FOLATE 50.5  TIBC 327  IRON 77   Urine  analysis:    Component Value Date/Time   COLORURINE STRAW (A) 03/01/2021 2114   APPEARANCEUR CLEAR 03/01/2021 2114   LABSPEC 1.011 03/01/2021 2114   PHURINE 5.0 03/01/2021 2114   GLUCOSEU 50 (A) 03/01/2021 2114   HGBUR NEGATIVE 03/01/2021 2114   BILIRUBINUR NEGATIVE 03/01/2021 2114   [place] NEGATIVE 03/01/2021 2114   PROTEINUR 100 (A) 03/01/2021 2114   UROBILINOGEN 1.0 03/03/2014 0942   NITRITE NEGATIVE 03/01/2021 2114   LEUKOCYTESUR MODERATE (A) 03/01/2021 2114   Recent Results (from the past 240 hour(s))  SARS CORONAVIRUS 2 (TAT 6-24 HRS) Nasopharyngeal Nasopharyngeal Swab     Status: None   Collection Time: 03/01/21  7:58 PM   Specimen: Nasopharyngeal Swab  Result Value Ref Range Status   SARS Coronavirus 2 NEGATIVE NEGATIVE Final    Comment: (NOTE) SARS-CoV-2 target nucleic acids are NOT DETECTED.  The SARS-CoV-2 RNA is generally detectable in upper and lower  respiratory specimens during the acute phase of infection. Negative results do not preclude SARS-CoV-2 infection, do not rule out co-infections with other pathogens, and should not be used as the sole basis for treatment or other patient management decisions. Negative results must be combined with clinical observations, patient history, and epidemiological information. The expected result is Negative.  Fact Sheet for Patients: SugarRoll.be  Fact Sheet for Healthcare Providers: https://www.woods-mathews.com/  This test is not yet approved or cleared by the [place] FDA and  has been authorized for detection and/or diagnosis of SARS-CoV-2 by FDA under an Emergency Use Authorization (EUA). This EUA will remain  in effect (meaning this test can be used) for the duration of the COVID-19 declaration under Se ction 564(b)(1) of the Act, 21 U.S.C. section 360bbb-3(b)(1), unless the authorization is terminated or revoked sooner.  Performed at [facility], [place] [address], [place], [place] [address]       Radiology Studies: US RENAL  Result Date: 03/01/2021 CLINICAL DATA:  Acute kidney injury EXAM: RENAL / URINARY TRACT ULTRASOUND COMPLETE COMPARISON:  Abdominal ultrasound dated 04/26/2020 FINDINGS: Right Kidney: Renal measurements: 12.4 x 5.1 x 5.2 cm = volume: 171 mL. Echogenicity within normal limits. No mass or hydronephrosis visualized. Left Kidney: Renal measurements: 11.5 x 4.8 x 5.9 cm = volume: 171 mL. Poorly visualized. Echogenicity within normal limits. No mass or hydronephrosis visualized. Bladder: Appears normal for degree of bladder distention. Other: None. IMPRESSION: Negative renal ultrasound.  No hydronephrosis. Electronically Signed   By: Julian Hy M.D.   On: 03/01/2021 21:05    Scheduled Meds:  atorvastatin  20 mg Oral QHS   carvedilol  6.25 mg Oral BID WC   donepezil  10 mg Oral QHS   folic acid  1 mg Oral Daily   heparin  5,000 Units Subcutaneous Q8H   lacosamide  200 mg Oral BID   vitamin B-12  1,000 mcg Oral Daily   Continuous Infusions:  sodium chloride 75 mL/hr at 03/02/21 1036     LOS: 0 days   Time spent: 25 minutes.  Patrecia Pour, MD [facility] [email or web address] 03/02/2021, 4:53 PM

## 2021-03-03 LAB — UREA NITROGEN, URINE: Urea Nitrogen, Ur: 373 mg/dL

## 2021-03-03 LAB — BASIC METABOLIC PANEL
Anion gap: 9 (ref 5–15)
BUN: 67 mg/dL — ABNORMAL HIGH (ref 6–20)
CO2: 20 mmol/L — ABNORMAL LOW (ref 22–32)
Calcium: 8.6 mg/dL — ABNORMAL LOW (ref 8.9–10.3)
Chloride: 105 mmol/L (ref 98–111)
Creatinine, Ser: 2.47 mg/dL — ABNORMAL HIGH (ref 0.44–1.00)
GFR, Estimated: 22 mL/min — ABNORMAL LOW (ref >60)
Glucose, Bld: 104 mg/dL — ABNORMAL HIGH (ref 70–99)
Potassium: 5.2 mmol/L — ABNORMAL HIGH (ref 3.5–5.1)
Sodium: 134 mmol/L — ABNORMAL LOW (ref 135–145)

## 2021-03-03 MED ORDER — SODIUM ZIRCONIUM CYCLOSILICATE 10 G PO PACK
10.0000 g | PACK | Freq: Once | ORAL | Status: AC
  Administered 2021-03-03: 10 g via ORAL
  Filled 2021-03-03 (×2): qty 1

## 2021-03-03 MED ORDER — SODIUM CHLORIDE 0.45 % IV SOLN: INTRAVENOUS | Status: DC

## 2021-03-03 NOTE — Progress Notes (Signed)
[name]  MCN:[id number] DOB: 07/21/1961 DOA: 03/01/2021 PCP: Merrilee Seashore, MD  Outpatient Specialists: Nephrology, [facility] (Formerly Dr. Lorrene Reid) Brief Narrative: Marissa Wilkerson is a 60 y.o. 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 Findings confirmed in ED with ECG demonstrating peaked T waves. Dextrose/insulin, bicarbonate, 1L NS, and IV calcium gluconate were given and admission requested. Lokelma added with modest improvement but remains abnormal.   Assessment & Plan: Principal Problem:   Hyperkalemia Active Problems:   Chronic respiratory failure (HCC)   Diabetes mellitus without complication (HCC)   Diastolic dysfunction   Sarcoidosis of lung (HCC)   Chronic diastolic CHF (congestive heart failure) (HCC)   Acute renal failure superimposed on stage 3a chronic kidney disease (HCC)   History of seizure   Other cirrhosis of liver (HCC)   Anemia   Dementia (HCC)  Hyperkalemic AKI on stage IIIa CKD: Remains hyperkalemic. Does not eat any fruits. - Hold spironolactone w/hyperK as well as torsemide w/AKI.  - Continue low level IVF. - Repeat lokelma this morning as K went back up despite holding spiro. D/w nephrology by phone, Dr. Joylene Grapes. Will formally consult if K remains elevated or creatinine does not continue improving.  - Continue telemetry - Renal U/S unremarkable, pt with known proteinuria on UA, pyuria, and hyaline casts.  - Repeat BMP in AM. - Will need to reestablish with [facility] after discharge.   Anemia of CKD: At baseline.  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 BP improved.    Idiopathic cirrhosis: No evidence of ascites/decompensation at this time.  - Restart home meds once euvolemic.   Seizure disorder - Continue vimpat and [facility] follow up   Type 2 diabetes with gastroparesis:  - SSI prn   Dementia: she is currently oriented. ?attributed to sequelae of seizure, has had formal testing to establish diagnosis.  - Continue aricept   Debility - Patient is mostly bedbound and husband is caretaker. She's at baseline and has refused returning to SNF (spent < 1 day at [facility] in the past). Will continue HH Tx.   Obesity: Estimated body mass index is 43.58 kg/m as calculated from the following:   Height as of this encounter: 5\' 3"  (1.6 m).   Weight as of 05/09/20: 111.6 kg.  DVT prophylaxis: Heparin Code Status: Full Family Communication: Husband at bedside Disposition Plan:  Status is: Inpatient  Remains inpatient appropriate because:Persistent severe electrolyte disturbances  Dispo: The patient is from: Home              Anticipated d/c is to: Home              Patient currently is not medically stable to d/c.   Difficult to place patient No  Consultants:  None  Procedures:  None  Antimicrobials: None   Subjective: No new issues, has been eating and drinking well. No palpitations, chest pain or angina. No leg swelling or dyspnea.  Objective: Vitals:   03/02/21 2342 03/03/21 0159 03/03/21 0453 03/03/21 1348  BP: (!) 191/85 132/72 (!) 178/68 (!) 143/58  Pulse: 62 61 61 72  Resp:  18 18 17   Temp:  98.1 F (36.7 C) 98.2 F (36.8 C) 98.1 F (36.7 C)  TempSrc:    Oral  SpO2:  98% 100% 100%  Height:        Intake/Output Summary (Last 24 hours) at 03/03/2021 1852 Last data filed at 03/03/2021 1300 Gross per 24 hour  Intake  240 ml  Output 1400 ml  Net -1160 ml   Gen: 60 y.o. female in no distress Pulm: Nonlabored breathing room air. Clear today. CV: Regular rate and rhythm. No murmur, rub, or gallop. No JVD, trace edema in upper thigh and lower abdominal wall GI: Abdomen soft, non-tender, non-distended, with normoactive bowel sounds.  Ext: Warm, no deformities Skin: No new rashes, lesions or ulcers on visualized skin. Neuro: Alert and oriented. No focal neurological deficits. Psych: Judgement and insight appear fair. Mood euthymic & affect congruent. Behavior is appropriate.    Data Reviewed: I have personally reviewed following labs and imaging studies  CBC: Recent Labs  Lab 03/01/21 1649  WBC 4.6  NEUTROABS 3.2  HGB 8.3*  HCT 25.7*  MCV 91.8  PLT 606   Basic Metabolic Panel: Recent Labs  Lab 03/01/21 1510 03/01/21 1936 03/02/21 0408 03/02/21 1428 03/03/21 0501  NA 135  --  135 134* 134*  K 6.0* 5.6* 5.4* 5.0 5.2*  CL 104  --  104 103 105  CO2 20*  --  23 24 20*  GLUCOSE 144*  --  106* 141* 104*  BUN 95*  --  80* 78* 67*  CREATININE 3.13*  --  2.68* 2.73* 2.47*  CALCIUM 8.8*  --  8.7* 8.4* 8.6*   CBG: Recent Labs  Lab 03/02/21 1728  GLUCAP 100*    Anemia Panel: Recent Labs    03/01/21 2100  VITAMINB12 2,934*  FOLATE 50.5  TIBC 327  IRON 77   Urine analysis:    Component Value Date/Time   COLORURINE STRAW (A) 03/01/2021 2114   APPEARANCEUR CLEAR 03/01/2021 2114   LABSPEC 1.011 03/01/2021 2114   PHURINE 5.0 03/01/2021 2114   GLUCOSEU 50 (A) 03/01/2021 2114   HGBUR NEGATIVE 03/01/2021 2114   [place] NEGATIVE 03/01/2021 2114   [place] NEGATIVE 03/01/2021 2114   PROTEINUR 100 (A) 03/01/2021 2114   UROBILINOGEN 1.0 03/03/2014 0942   NITRITE NEGATIVE 03/01/2021 2114   LEUKOCYTESUR MODERATE (A) 03/01/2021 2114   Recent Results (from the past 240 hour(s))  SARS CORONAVIRUS 2 (TAT 6-24 HRS) Nasopharyngeal Nasopharyngeal Swab     Status: None   Collection Time:  03/01/21  7:58 PM   Specimen: Nasopharyngeal Swab  Result Value Ref Range Status   SARS Coronavirus 2 NEGATIVE NEGATIVE Final    Comment: (NOTE) SARS-CoV-2 target nucleic acids are NOT DETECTED.  The SARS-CoV-2 RNA is generally detectable in upper and lower respiratory specimens during the acute phase of infection. Negative results do not preclude SARS-CoV-2 infection, do not rule out co-infections with other pathogens, and should not be used as the sole basis for treatment or other patient management decisions. Negative results must be combined with clinical observations, patient history, and epidemiological information. The expected result is Negative.  Fact Sheet for Patients: SugarRoll.be  Fact Sheet for Healthcare Providers: https://www.woods-mathews.com/  This test is not yet approved or cleared by the [place] FDA and  has been authorized for  detection and/or diagnosis of SARS-CoV-2 by FDA under an Emergency Use Authorization (EUA). This EUA will remain  in effect (meaning this test can be used) for the duration of the COVID-19 declaration under Se ction 564(b)(1) of the Act, 21 U.S.C. section 360bbb-3(b)(1), unless the authorization is terminated or revoked sooner.  Performed at [facility], [place] [address] [place], [place] [address]       Radiology Studies: US RENAL  Result Date: 03/01/2021 CLINICAL DATA:  Acute kidney injury EXAM: RENAL / URINARY TRACT ULTRASOUND COMPLETE COMPARISON:  Abdominal ultrasound dated 04/26/2020 FINDINGS: Right Kidney: Renal measurements: 12.4 x 5.1 x 5.2 cm = volume: 171 mL. Echogenicity within normal limits. No mass or hydronephrosis visualized. Left Kidney: Renal measurements: 11.5 x 4.8 x 5.9 cm = volume: 171 mL. Poorly visualized. Echogenicity within normal limits. No mass or hydronephrosis visualized. Bladder: Appears normal for degree of bladder distention. Other: None. IMPRESSION:  Negative renal ultrasound.  No hydronephrosis. Electronically Signed   By: Julian Hy M.D.   On: 03/01/2021 21:05    Scheduled Meds:  amLODipine  5 mg Oral Daily   atorvastatin  20 mg Oral QHS   carvedilol  6.25 mg Oral BID WC   donepezil  10 mg Oral QHS   folic acid  1 mg Oral Daily   heparin  5,000 Units Subcutaneous Q8H   lacosamide  200 mg Oral BID   vitamin B-12  1,000 mcg Oral Daily   Continuous Infusions:   LOS: 1 day   Time spent: 25 minutes.  Patrecia Pour, MD [facility] [email or web address] 03/03/2021, 6:52 PM

## 2021-03-04 LAB — BASIC METABOLIC PANEL
Anion gap: 9 (ref 5–15)
BUN: 67 mg/dL — ABNORMAL HIGH (ref 6–20)
CO2: 21 mmol/L — ABNORMAL LOW (ref 22–32)
Calcium: 8.2 mg/dL — ABNORMAL LOW (ref 8.9–10.3)
Chloride: 104 mmol/L (ref 98–111)
Creatinine, Ser: 2.57 mg/dL — ABNORMAL HIGH (ref 0.44–1.00)
GFR, Estimated: 21 mL/min — ABNORMAL LOW (ref >60)
Glucose, Bld: 127 mg/dL — ABNORMAL HIGH (ref 70–99)
Potassium: 5.3 mmol/L — ABNORMAL HIGH (ref 3.5–5.1)
Sodium: 134 mmol/L — ABNORMAL LOW (ref 135–145)

## 2021-03-04 MED ORDER — SODIUM ZIRCONIUM CYCLOSILICATE 10 G PO PACK
10.0000 g | PACK | Freq: Every day | ORAL | Status: DC
  Administered 2021-03-04: 10 g via ORAL
  Filled 2021-03-04: qty 1

## 2021-03-04 MED ORDER — TORSEMIDE 100 MG PO TABS
100.0000 mg | ORAL_TABLET | Freq: Every day | ORAL | Status: DC
  Filled 2021-03-04: qty 1

## 2021-03-04 MED ORDER — SPIRONOLACTONE 100 MG PO TABS: 50.0000 mg | ORAL_TABLET | Freq: Every day | ORAL | Status: AC

## 2021-03-04 NOTE — Discharge Instructions (Signed)
Try to minimize foods that are rich in potassium:  Bananas, oranges, cantaloupe, honeydew, apricots, grapefruit (some dried fruits, such as prunes, raisins, and dates, are also high in potassium) Cooked spinach Cooked broccoli Potatoes Sweet potatoes Mushrooms Peas Cucumbers Zucchini Pumpkins Leafy greens Orange juice Tomato juice Prune juice Apricot juice Grapefruit juice Milk and yogurt  [place] Cod Trout Rockfish Lima beans Pinto beans Kidney beans Soybeans Lentils

## 2021-03-04 NOTE — Discharge Summary (Signed)
Physician Discharge Summary  LAKYRA TIPPINS PYP:[id number] DOB: [birth year] DOA: 03/01/2021  PCP: Merrilee Seashore, MD  Admit date: 03/01/2021 Discharge date: 03/04/2021  Admitted From: Home Disposition: Home   Recommendations for Outpatient Follow-up:  Follow up with PCP and/or nephrology with repeat BMP  Home Health: Continue Equipment/Devices: None new, continue 3L O2 Discharge Condition: Stable, improved CODE STATUS: Full Diet recommendation: Renal, heart healthy, carb-modified  Brief/Interim Summary: BEVERLIE KURIHARA is a 60 y.o. 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 Findings confirmed in ED with ECG demonstrating peaked T waves. Dextrose/insulin, bicarbonate, 1L NS, and IV calcium gluconate were given and admission requested. Lokelma was given, spironolactone and torsemide held with slow improvement in renal function and potassium. Nephrology was contacted and will have the nephrology clinic arrange follow up with repeat BMP, and agrees with plan to hold spironolactone until that time. The patient remains stable with resolution of ECG changes at discharge.   Discharge Diagnoses:  Principal Problem:   Hyperkalemia Active Problems:   Chronic respiratory failure (HCC)   Diabetes mellitus without complication (HCC)   Diastolic dysfunction   Sarcoidosis of lung (HCC)   Chronic diastolic CHF (congestive heart failure) (HCC)   Acute renal failure superimposed on stage 3a chronic kidney disease (HCC)   History of seizure   Other cirrhosis of liver (HCC)   Anemia   Dementia (HCC)  Hyperkalemic AKI on stage IIIa CKD:  - Hold  spironolactone w/hyperK. Renal function improving and no longer appears to require IV fluids. Will restart loop diuretic at discharge.  - Renal U/S unremarkable, pt with known proteinuria on UA, pyuria, and hyaline casts. - Repeat BMP at follow up.  - Will need to reestablish with [facility] after discharge. Dr. Royce Macadamia will reportedly have [facility] reach out to patient who also knows follow up is critically important and has the clinic's number.    Anemia of CKD: At baseline. - Continue home folate and vitamin B12   Pulmonary sarcoidosis with chronic hypoxic respiratory failure: - Continue stable on home 3 L   Chronic diastolic heart failure, HTN:  - Monitor fluid status  - Continue Coreg, restart torsemide   Idiopathic cirrhosis: No evidence of ascites/decompensation at this time. - Restart home torsemide.   Seizure disorder - Continue vimpat and [facility] follow up   Type 2 diabetes with gastroparesis: - No changes   Dementia: she is currently oriented. ?attributed to sequelae of seizure, has had formal testing to establish diagnosis. - Continue aricept   Debility - Patient is mostly bedbound and husband is caretaker. She's at baseline and has refused returning to SNF (spent < 1 day at [facility] in the past). Will continue HH Tx.   Obesity: Estimated body mass index is 43.58 kg/m  Discharge Instructions Discharge Instructions     Diet - low sodium heart healthy   Complete by: As directed    Discharge instructions   Complete by: As directed    Continue medications as you were prior to arrival with the exception of spironolactone which you should hold until you follow up with your PCP and/or nephrology. Please schedule an appointment as soon as possible/next week for recheck of  metabolic panel and seek medical advice right away if you notice shortness of breath or increasing swelling.   Increase activity slowly   Complete by: As directed       Allergies as of 03/04/2021        Reactions   Cymbalta [duloxetine Hcl] Other (See Comments)   Hallucinations    Gabapentin Other (See Comments)   Caused uncontrolled muscle movements and the patient did not know who she was   Metformin Nausea And Vomiting   Fentanyl Other (See Comments)   Blistered the skin   Metformin And Related Nausea And Vomiting   Mobic [meloxicam] Nausea And Vomiting   Sulfa Antibiotics Rash   Voltaren [diclofenac] Nausea And Vomiting        Medication List     TAKE these medications    acetaminophen 325 MG tablet Commonly known as: TYLENOL Take 2 tablets (650 mg total) by mouth every 6 (six) hours as needed for mild pain (or Fever >/= 101).   albuterol 108 (90 Base) MCG/ACT inhaler Commonly known as: VENTOLIN HFA INHALE 2 PUFFS INTO THE LUNGS EVERY 6 HOURS AS NEEDED FOR WHEEZING OR SHORTNESS OF BREATH.   diphenhydrAMINE 25 MG tablet Commonly known as: BENADRYL Take 25 mg by mouth every 6 (six) hours as needed for allergies.   folic acid 712 MCG tablet Commonly known as: FOLVITE Take 800 mcg by mouth in the morning.   lacosamide 200 MG Tabs tablet Commonly known as: VIMPAT TAKE 1 TABLET BY MOUTH 2 TIMES DAILY   metolazone 2.5 MG tablet Commonly known as: ZAROXOLYN Take 2.5 mg by mouth daily as needed (for fluid or swelling).   spironolactone 100 MG tablet Commonly known as: ALDACTONE Take 0.5 tablets (50 mg total) by mouth daily. Stop this medication until you follow up with your PCP or nephrologist What changed:  how much to take how to take this when to take this additional instructions   vitamin B-12 1000 MCG tablet Commonly known as: CYANOCOBALAMIN Take 1 tablet (1,000 mcg total) by mouth daily.       ASK your doctor about these medications    atorvastatin 20 MG tablet Commonly known as: LIPITOR Take 1 tablet by mouth daily   carvedilol 6.25 MG tablet Commonly known as: COREG TAKE 1 TABLET BY MOUTH TWICE DAILY WITH FOOD   donepezil 10 MG  tablet Commonly known as: ARICEPT TAKE 1 TABLET BY MOUTH DAILY   HYDROcodone-acetaminophen 10-325 MG tablet Commonly known as: NORCO Take 1 tablet by mouth every 4 - 6 hours as needed for pain.  do not fill before 02/09/21   insulin lispro 100 UNIT/ML injection Commonly known as: HUMALOG Before each meal 3 times a day, 140-199 - 2 units, 200-250 - 4 units, 251-299 - 6 units,  300-349 - 8 units,  350 or above 10 units. Insulin PEN if approved, provide syringes and needles if needed.   promethazine 25 MG tablet Commonly known as: PHENERGAN Take 1 tablet by mouth every 6 hours as needed for gastroparesis for 90 days.   tiZANidine 4 MG tablet Commonly known as: ZANAFLEX Take 1 tablet by mouth every 8 hours as needed for 90 days.   torsemide 100 MG tablet Commonly known as: DEMADEX Take 1 tablet (100 mg total) by mouth daily.        Follow-up Information     Merrilee Seashore, MD. Schedule an appointment as soon as possible for a visit.   Specialty: Internal Medicine Contact information: [address]  [place] [address] [phone]         [place], [place]. Schedule an appointment as soon as possible for a visit.   Contact information: [place] [address] [phone]                Allergies  Allergen Reactions   Cymbalta [Duloxetine Hcl] Other (See Comments)    Hallucinations    Gabapentin Other (See Comments)    Caused uncontrolled muscle movements and the patient did not know who she was   Metformin Nausea And Vomiting   Fentanyl Other (See Comments)    Blistered the skin   Metformin And Related Nausea And Vomiting   Mobic [Meloxicam] Nausea And Vomiting   Sulfa Antibiotics Rash   Voltaren [Diclofenac] Nausea And Vomiting    Consultations: Nephrology by phone x2  Procedures/Studies: US RENAL  Result Date: 03/01/2021 CLINICAL DATA:  Acute kidney injury EXAM: RENAL / URINARY TRACT ULTRASOUND COMPLETE COMPARISON:   Abdominal ultrasound dated 04/26/2020 FINDINGS: Right Kidney: Renal measurements: 12.4 x 5.1 x 5.2 cm = volume: 171 mL. Echogenicity within normal limits. No mass or hydronephrosis visualized. Left Kidney: Renal measurements: 11.5 x 4.8 x 5.9 cm = volume: 171 mL. Poorly visualized. Echogenicity within normal limits. No mass or hydronephrosis visualized. Bladder: Appears normal for degree of bladder distention. Other: None. IMPRESSION: Negative renal ultrasound.  No hydronephrosis. Electronically Signed   By: Julian Hy M.D.   On: 03/01/2021 21:05     Subjective: Feels well, excited to go home. Eating/drinking/urinating normally. At functional baseline.   Discharge Exam: Vitals:   03/03/21 2033 03/04/21 0506  BP: (!) 143/54 (!) 156/55  Pulse: 70 68  Resp: 18 18  Temp: 98.2 F (36.8 C) 97.8 F (36.6 C)  SpO2: 100% 100%   General: Pt is alert, awake, not in acute distress Cardiovascular: RRR, S1/S2 +, no rubs, no gallops Respiratory: Clear, nonlabored Abdominal: Soft, NT, ND, bowel sounds + Extremities: Mild pitting on lower abdominal wall, none in lower legs.   Labs: BNP (last 3 results) Recent Labs    04/24/20 1349 05/07/20 2353  BNP 264.5* 295.6*   Basic Metabolic Panel: Recent Labs  Lab 03/01/21 1510 03/01/21 1936 03/02/21 0408 03/02/21 1428 03/03/21 0501 03/04/21 0514  NA 135  --  135 134* 134* 134*  K 6.0* 5.6* 5.4* 5.0 5.2* 5.3*  CL 104  --  104 103 105 104  CO2 20*  --  23 24 20* 21*  GLUCOSE 144*  --  106* 141* 104* 127*  BUN 95*  --  80* 78* 67* 67*  CREATININE 3.13*  --  2.68* 2.73* 2.47* 2.57*  CALCIUM 8.8*  --  8.7* 8.4* 8.6* 8.2*   Liver Function Tests: No results for input(s): AST, ALT, ALKPHOS, BILITOT, PROT, ALBUMIN in the last 168 hours. No results for input(s): LIPASE, AMYLASE in the last 168 hours. No results for input(s): AMMONIA in the last 168 hours. CBC: Recent Labs  Lab 03/01/21 1649  WBC 4.6  NEUTROABS 3.2  HGB 8.3*  HCT 25.7*   MCV 91.8  PLT 184   Cardiac Enzymes: No results for input(s): CKTOTAL, CKMB, CKMBINDEX, TROPONINI in the last 168 hours. BNP: Invalid input(s): POCBNP CBG: Recent Labs  Lab 03/02/21 1728  GLUCAP 100*   D-Dimer No results for input(s): DDIMER in the last 72 hours. Hgb A1c No results for input(s): HGBA1C in the last 72 hours. Lipid Profile No results for input(s): CHOL, HDL, LDLCALC, TRIG,  CHOLHDL, LDLDIRECT in the last 72 hours. Thyroid function studies No results for input(s): TSH, T4TOTAL, T3FREE, THYROIDAB in the last 72 hours.  Invalid input(s): FREET3 Anemia work up Recent Labs    03/01/21 2100  VITAMINB12 2,934*  FOLATE 50.5  TIBC 327  IRON 77   Urinalysis    Component Value Date/Time   COLORURINE STRAW (A) 03/01/2021 2114   APPEARANCEUR CLEAR 03/01/2021 2114   LABSPEC 1.011 03/01/2021 2114   PHURINE 5.0 03/01/2021 2114   GLUCOSEU 50 (A) 03/01/2021 2114   HGBUR NEGATIVE 03/01/2021 2114   BILIRUBINUR NEGATIVE 03/01/2021 2114   [place] NEGATIVE 03/01/2021 2114   PROTEINUR 100 (A) 03/01/2021 2114   UROBILINOGEN 1.0 03/03/2014 0942   NITRITE NEGATIVE 03/01/2021 2114   LEUKOCYTESUR MODERATE (A) 03/01/2021 2114    Microbiology Recent Results (from the past 240 hour(s))  SARS CORONAVIRUS 2 (TAT 6-24 HRS) Nasopharyngeal Nasopharyngeal Swab     Status: None   Collection Time: 03/01/21  7:58 PM   Specimen: Nasopharyngeal Swab  Result Value Ref Range Status   SARS Coronavirus 2 NEGATIVE NEGATIVE Final    Comment: (NOTE) SARS-CoV-2 target nucleic acids are NOT DETECTED.  The SARS-CoV-2 RNA is generally detectable in upper and lower respiratory specimens during the acute phase of infection. Negative results do not preclude SARS-CoV-2 infection, do not rule out co-infections with other pathogens, and should not be used as the sole basis for treatment or other patient management decisions. Negative results must be combined with clinical observations, patient  history, and epidemiological information. The expected result is Negative.  Fact Sheet for Patients: SugarRoll.be  Fact Sheet for Healthcare Providers: https://www.woods-mathews.com/  This test is not yet approved or cleared by the [place] FDA and  has been authorized for detection and/or diagnosis of SARS-CoV-2 by FDA under an Emergency Use Authorization (EUA). This EUA will remain  in effect (meaning this test can be used) for the duration of the COVID-19 declaration under Se ction 564(b)(1) of the Act, 21 U.S.C. section 360bbb-3(b)(1), unless the authorization is terminated or revoked sooner.  Performed at [facility], [place] [address] [place], [place] [address]     Time coordinating discharge: Approximately 40 minutes  Patrecia Pour, MD  [facility] 03/04/2021, 10:18 AM

## 2021-03-04 NOTE — Progress Notes (Signed)
Nutrition Education Note  RD consulted for diet education, specifically for low potassium foods education. Provided "Potassium content of foods list" to patient/family. Reviewed food groups and provided written recommended serving sizes specifically determined for patient's current nutritional status. Pt's husband present for education, he typically cooks for the pt.  Strongly encouraged compliance of this diet.  Teach back method used.  Pt reports she has gastroparesis as well. Provided handout.  Expect good compliance. Has support of husband.  Body mass index is 43.58 kg/m. Pt meets criteria for morbid obesity based on current BMI.  Current diet order is renal, patient is consuming approximately 100% of meals at this time. Labs and medications reviewed. No further nutrition interventions warranted at this time. If additional nutrition issues arise, please re-consult RD.  Clayton Bibles, MS, RD, LDN Inpatient Clinical Dietitian Contact information available via Amion

## 2021-03-04 NOTE — Progress Notes (Signed)
Patient discharged via [facility]. IV removed and all belongings sent with patient. Discharge teaching reviewed with patient and family member at bedside.

## 2021-03-04 NOTE — TOC Transition Note (Signed)
Transition of Care [facility]) - CM/SW Discharge Note   Patient Details  Name: Marissa Wilkerson MRN: [id number] Date of Birth: 1961-02-05  [facility] CM/SW Contact:  Trish Mage, LCSW Phone Number: 03/04/2021, 12:27 PM   Clinical Narrative:   Patient who is stable for d/c today is bed bound, needs ambulance ride home.  Also is receiving [place] services from [facility], [place], RN.  Orders seen and appreciated. Contacted Lisa with [facility] to alert her of d/c today. [facility] arranged. TOC sign off.    Final next level of care: [place] Barriers to Discharge: No Barriers Identified   Patient Goals and CMS Choice        Discharge Placement                       Discharge Plan and Services                                     Social Determinants of Health (SDOH) Interventions     Readmission Risk Interventions Readmission Risk Prevention Plan 04/29/2020  Transportation Screening Complete  PCP or Specialist Appt within 3-5 Days Complete  HRI or [place] Complete  Social Work Consult for [place] Planning/Counseling Complete  Palliative Care Screening Complete  Medication Review [employer]) Referral to Pharmacy  Some recent data might be hidden

## 2021-03-04 NOTE — Plan of Care (Signed)

## 2021-03-10 ENCOUNTER — Other Ambulatory Visit (HOSPITAL_COMMUNITY): Payer: Self-pay

## 2021-03-10 MED ORDER — HYDROCODONE-ACETAMINOPHEN 10-325 MG PO TABS
ORAL_TABLET | ORAL | 0 refills | Status: DC
  Filled 2021-03-11: qty 120, 30d supply, fill #0

## 2021-03-11 ENCOUNTER — Other Ambulatory Visit (HOSPITAL_COMMUNITY): Payer: Self-pay

## 2021-03-22 ENCOUNTER — Other Ambulatory Visit (HOSPITAL_COMMUNITY): Payer: Self-pay

## 2021-03-23 ENCOUNTER — Other Ambulatory Visit (HOSPITAL_COMMUNITY): Payer: Self-pay

## 2021-03-24 ENCOUNTER — Other Ambulatory Visit (HOSPITAL_COMMUNITY): Payer: Self-pay

## 2021-03-24 MED ORDER — TIZANIDINE HCL 4 MG PO TABS
ORAL_TABLET | ORAL | 1 refills | Status: DC
  Filled 2021-03-24: qty 270, 90d supply, fill #0
  Filled 2021-06-27: qty 270, 90d supply, fill #1

## 2021-03-25 ENCOUNTER — Other Ambulatory Visit (HOSPITAL_COMMUNITY): Payer: Self-pay

## 2021-03-28 ENCOUNTER — Other Ambulatory Visit (HOSPITAL_COMMUNITY): Payer: Self-pay

## 2021-03-29 ENCOUNTER — Other Ambulatory Visit (HOSPITAL_COMMUNITY): Payer: Self-pay

## 2021-03-29 MED ORDER — TORSEMIDE 20 MG PO TABS
ORAL_TABLET | ORAL | 0 refills | Status: DC
  Filled 2021-03-29: qty 90, 30d supply, fill #0

## 2021-03-31 ENCOUNTER — Other Ambulatory Visit (HOSPITAL_COMMUNITY): Payer: Self-pay

## 2021-03-31 MED ORDER — HYDROCODONE-ACETAMINOPHEN 10-325 MG PO TABS
ORAL_TABLET | ORAL | 0 refills | Status: AC
  Filled 2021-04-11: qty 120, 30d supply, fill #0

## 2021-04-04 ENCOUNTER — Other Ambulatory Visit (HOSPITAL_COMMUNITY): Payer: Self-pay

## 2021-04-04 MED ORDER — CARVEDILOL 6.25 MG PO TABS
ORAL_TABLET | ORAL | 2 refills | Status: AC
  Filled 2021-04-04: qty 180, 90d supply, fill #0
  Filled 2021-07-03: qty 180, 90d supply, fill #1

## 2021-04-11 ENCOUNTER — Other Ambulatory Visit (HOSPITAL_COMMUNITY): Payer: Self-pay

## 2021-04-14 ENCOUNTER — Other Ambulatory Visit (HOSPITAL_COMMUNITY): Payer: Self-pay

## 2021-04-14 MED ORDER — PROMETHAZINE HCL 25 MG PO TABS
ORAL_TABLET | ORAL | 0 refills | Status: DC
  Filled 2021-04-14: qty 360, 90d supply, fill #0

## 2021-04-18 ENCOUNTER — Other Ambulatory Visit (HOSPITAL_COMMUNITY): Payer: Self-pay

## 2021-05-02 ENCOUNTER — Other Ambulatory Visit (HOSPITAL_COMMUNITY): Payer: Self-pay

## 2021-05-03 ENCOUNTER — Other Ambulatory Visit (HOSPITAL_COMMUNITY): Payer: Self-pay

## 2021-05-03 MED ORDER — ATORVASTATIN CALCIUM 20 MG PO TABS
ORAL_TABLET | ORAL | 3 refills | Status: AC
  Filled 2021-05-03 – 2021-06-02 (×2): qty 90, 90d supply, fill #0
  Filled 2021-08-28: qty 90, 90d supply, fill #1

## 2021-05-04 ENCOUNTER — Other Ambulatory Visit (HOSPITAL_COMMUNITY): Payer: Self-pay

## 2021-05-04 MED ORDER — TORSEMIDE 20 MG PO TABS
40.0000 mg | ORAL_TABLET | Freq: Every day | ORAL | 5 refills | Status: AC
  Filled 2021-05-04: qty 180, 90d supply, fill #0
  Filled 2021-08-24: qty 180, 90d supply, fill #1

## 2021-05-05 ENCOUNTER — Other Ambulatory Visit (HOSPITAL_COMMUNITY): Payer: Self-pay

## 2021-05-05 MED ORDER — HYDROCODONE-ACETAMINOPHEN 10-325 MG PO TABS
ORAL_TABLET | ORAL | 0 refills | Status: DC
  Filled 2021-05-12: qty 120, 20d supply, fill #0

## 2021-05-12 ENCOUNTER — Other Ambulatory Visit (HOSPITAL_COMMUNITY): Payer: Self-pay

## 2021-05-23 ENCOUNTER — Other Ambulatory Visit: Payer: Self-pay | Admitting: Pulmonary Disease

## 2021-05-23 ENCOUNTER — Other Ambulatory Visit (HOSPITAL_COMMUNITY): Payer: Self-pay

## 2021-05-23 MED ORDER — ALBUTEROL SULFATE HFA 108 (90 BASE) MCG/ACT IN AERS
2.0000 | INHALATION_SPRAY | Freq: Four times a day (QID) | RESPIRATORY_TRACT | 5 refills | Status: AC | PRN
  Filled 2021-05-23: qty 8.5, 25d supply, fill #0
  Filled 2021-07-04: qty 8.5, 25d supply, fill #1

## 2021-05-24 ENCOUNTER — Other Ambulatory Visit (HOSPITAL_COMMUNITY): Payer: Self-pay

## 2021-05-24 MED ORDER — LACOSAMIDE 200 MG PO TABS
ORAL_TABLET | ORAL | 1 refills | Status: AC
  Filled 2021-05-24: qty 180, 90d supply, fill #0
  Filled 2021-08-24: qty 180, 90d supply, fill #1

## 2021-05-24 MED ORDER — DONEPEZIL HCL 10 MG PO TABS
10.0000 mg | ORAL_TABLET | Freq: Every day | ORAL | 0 refills | Status: DC
  Filled 2021-05-24: qty 90, 90d supply, fill #0

## 2021-05-25 ENCOUNTER — Other Ambulatory Visit (HOSPITAL_COMMUNITY): Payer: Self-pay

## 2021-05-31 ENCOUNTER — Other Ambulatory Visit (HOSPITAL_COMMUNITY): Payer: Self-pay

## 2021-06-02 ENCOUNTER — Other Ambulatory Visit (HOSPITAL_COMMUNITY): Payer: Self-pay

## 2021-06-02 MED ORDER — HYDROCODONE-ACETAMINOPHEN 10-325 MG PO TABS
ORAL_TABLET | ORAL | 0 refills | Status: DC
  Filled 2021-06-02: qty 120, 20d supply, fill #0
  Filled 2021-06-09: qty 120, 30d supply, fill #0

## 2021-06-06 ENCOUNTER — Other Ambulatory Visit (HOSPITAL_COMMUNITY): Payer: Self-pay

## 2021-06-09 ENCOUNTER — Other Ambulatory Visit (HOSPITAL_COMMUNITY): Payer: Self-pay

## 2021-06-27 ENCOUNTER — Other Ambulatory Visit (HOSPITAL_COMMUNITY): Payer: Self-pay

## 2021-06-27 MED ORDER — TIZANIDINE HCL 4 MG PO TABS
ORAL_TABLET | ORAL | 1 refills | Status: AC
  Filled 2021-06-27: qty 270, 90d supply, fill #0

## 2021-06-30 ENCOUNTER — Other Ambulatory Visit (HOSPITAL_COMMUNITY): Payer: Self-pay

## 2021-06-30 MED ORDER — HYDROCODONE-ACETAMINOPHEN 10-325 MG PO TABS
ORAL_TABLET | ORAL | 0 refills | Status: DC
  Filled 2021-06-30 – 2021-07-11 (×2): qty 120, 20d supply, fill #0

## 2021-07-04 ENCOUNTER — Other Ambulatory Visit (HOSPITAL_COMMUNITY): Payer: Self-pay

## 2021-07-10 ENCOUNTER — Other Ambulatory Visit (HOSPITAL_COMMUNITY): Payer: Self-pay

## 2021-07-10 MED ORDER — PROMETHAZINE HCL 25 MG PO TABS
ORAL_TABLET | ORAL | 3 refills | Status: AC
  Filled 2021-07-10: qty 360, 90d supply, fill #0

## 2021-07-11 ENCOUNTER — Other Ambulatory Visit (HOSPITAL_COMMUNITY): Payer: Self-pay

## 2021-07-12 ENCOUNTER — Other Ambulatory Visit (HOSPITAL_COMMUNITY): Payer: Self-pay

## 2021-07-12 MED ORDER — LOSARTAN POTASSIUM 25 MG PO TABS
ORAL_TABLET | ORAL | 6 refills | Status: AC
  Filled 2021-07-12: qty 30, 30d supply, fill #0
  Filled 2021-08-26: qty 30, 30d supply, fill #1

## 2021-07-25 ENCOUNTER — Other Ambulatory Visit (HOSPITAL_COMMUNITY): Payer: Self-pay

## 2021-07-25 MED ORDER — METOLAZONE 2.5 MG PO TABS
2.5000 mg | ORAL_TABLET | Freq: Every day | ORAL | 0 refills | Status: AC
  Filled 2021-07-25: qty 5, 5d supply, fill #0

## 2021-07-27 ENCOUNTER — Other Ambulatory Visit (HOSPITAL_COMMUNITY): Payer: Self-pay

## 2021-07-28 ENCOUNTER — Other Ambulatory Visit (HOSPITAL_COMMUNITY): Payer: Self-pay

## 2021-07-28 DIAGNOSIS — M47817 Spondylosis without myelopathy or radiculopathy, lumbosacral region: Secondary | ICD-10-CM | POA: Diagnosis not present

## 2021-07-28 DIAGNOSIS — G894 Chronic pain syndrome: Secondary | ICD-10-CM | POA: Diagnosis not present

## 2021-07-28 MED ORDER — HYDROCODONE-ACETAMINOPHEN 10-325 MG PO TABS
ORAL_TABLET | ORAL | 0 refills | Status: DC
  Filled 2021-07-28 – 2021-08-10 (×2): qty 120, 20d supply, fill #0

## 2021-08-01 ENCOUNTER — Other Ambulatory Visit (HOSPITAL_COMMUNITY): Payer: Self-pay

## 2021-08-01 DIAGNOSIS — I130 Hypertensive heart and chronic kidney disease with heart failure and stage 1 through stage 4 chronic kidney disease, or unspecified chronic kidney disease: Secondary | ICD-10-CM | POA: Diagnosis not present

## 2021-08-01 DIAGNOSIS — E785 Hyperlipidemia, unspecified: Secondary | ICD-10-CM | POA: Diagnosis not present

## 2021-08-01 DIAGNOSIS — K219 Gastro-esophageal reflux disease without esophagitis: Secondary | ICD-10-CM | POA: Diagnosis not present

## 2021-08-01 DIAGNOSIS — K7460 Unspecified cirrhosis of liver: Secondary | ICD-10-CM | POA: Diagnosis not present

## 2021-08-01 DIAGNOSIS — K279 Peptic ulcer, site unspecified, unspecified as acute or chronic, without hemorrhage or perforation: Secondary | ICD-10-CM | POA: Diagnosis not present

## 2021-08-01 DIAGNOSIS — Z9181 History of falling: Secondary | ICD-10-CM | POA: Diagnosis not present

## 2021-08-01 DIAGNOSIS — Z87891 Personal history of nicotine dependence: Secondary | ICD-10-CM | POA: Diagnosis not present

## 2021-08-01 DIAGNOSIS — M1990 Unspecified osteoarthritis, unspecified site: Secondary | ICD-10-CM | POA: Diagnosis not present

## 2021-08-01 DIAGNOSIS — I5032 Chronic diastolic (congestive) heart failure: Secondary | ICD-10-CM | POA: Diagnosis not present

## 2021-08-01 DIAGNOSIS — Z9981 Dependence on supplemental oxygen: Secondary | ICD-10-CM | POA: Diagnosis not present

## 2021-08-01 DIAGNOSIS — N1831 Chronic kidney disease, stage 3a: Secondary | ICD-10-CM | POA: Diagnosis not present

## 2021-08-01 DIAGNOSIS — D860 Sarcoidosis of lung: Secondary | ICD-10-CM | POA: Diagnosis not present

## 2021-08-01 DIAGNOSIS — Z79891 Long term (current) use of opiate analgesic: Secondary | ICD-10-CM | POA: Diagnosis not present

## 2021-08-01 DIAGNOSIS — J9611 Chronic respiratory failure with hypoxia: Secondary | ICD-10-CM | POA: Diagnosis not present

## 2021-08-01 DIAGNOSIS — Z7401 Bed confinement status: Secondary | ICD-10-CM | POA: Diagnosis not present

## 2021-08-01 DIAGNOSIS — R42 Dizziness and giddiness: Secondary | ICD-10-CM | POA: Diagnosis not present

## 2021-08-01 DIAGNOSIS — E1143 Type 2 diabetes mellitus with diabetic autonomic (poly)neuropathy: Secondary | ICD-10-CM | POA: Diagnosis not present

## 2021-08-01 MED ORDER — LOSARTAN POTASSIUM 50 MG PO TABS
ORAL_TABLET | ORAL | 6 refills | Status: AC
  Filled 2021-08-01: qty 30, 30d supply, fill #0

## 2021-08-09 DIAGNOSIS — K219 Gastro-esophageal reflux disease without esophagitis: Secondary | ICD-10-CM | POA: Diagnosis not present

## 2021-08-09 DIAGNOSIS — Z79891 Long term (current) use of opiate analgesic: Secondary | ICD-10-CM | POA: Diagnosis not present

## 2021-08-09 DIAGNOSIS — D860 Sarcoidosis of lung: Secondary | ICD-10-CM | POA: Diagnosis not present

## 2021-08-09 DIAGNOSIS — K279 Peptic ulcer, site unspecified, unspecified as acute or chronic, without hemorrhage or perforation: Secondary | ICD-10-CM | POA: Diagnosis not present

## 2021-08-09 DIAGNOSIS — K7460 Unspecified cirrhosis of liver: Secondary | ICD-10-CM | POA: Diagnosis not present

## 2021-08-09 DIAGNOSIS — J9611 Chronic respiratory failure with hypoxia: Secondary | ICD-10-CM | POA: Diagnosis not present

## 2021-08-09 DIAGNOSIS — I130 Hypertensive heart and chronic kidney disease with heart failure and stage 1 through stage 4 chronic kidney disease, or unspecified chronic kidney disease: Secondary | ICD-10-CM | POA: Diagnosis not present

## 2021-08-09 DIAGNOSIS — E1143 Type 2 diabetes mellitus with diabetic autonomic (poly)neuropathy: Secondary | ICD-10-CM | POA: Diagnosis not present

## 2021-08-09 DIAGNOSIS — R42 Dizziness and giddiness: Secondary | ICD-10-CM | POA: Diagnosis not present

## 2021-08-09 DIAGNOSIS — Z9181 History of falling: Secondary | ICD-10-CM | POA: Diagnosis not present

## 2021-08-09 DIAGNOSIS — I5032 Chronic diastolic (congestive) heart failure: Secondary | ICD-10-CM | POA: Diagnosis not present

## 2021-08-09 DIAGNOSIS — E785 Hyperlipidemia, unspecified: Secondary | ICD-10-CM | POA: Diagnosis not present

## 2021-08-09 DIAGNOSIS — M1990 Unspecified osteoarthritis, unspecified site: Secondary | ICD-10-CM | POA: Diagnosis not present

## 2021-08-09 DIAGNOSIS — N1831 Chronic kidney disease, stage 3a: Secondary | ICD-10-CM | POA: Diagnosis not present

## 2021-08-09 DIAGNOSIS — Z87891 Personal history of nicotine dependence: Secondary | ICD-10-CM | POA: Diagnosis not present

## 2021-08-09 DIAGNOSIS — Z7401 Bed confinement status: Secondary | ICD-10-CM | POA: Diagnosis not present

## 2021-08-09 DIAGNOSIS — Z9981 Dependence on supplemental oxygen: Secondary | ICD-10-CM | POA: Diagnosis not present

## 2021-08-10 ENCOUNTER — Other Ambulatory Visit (HOSPITAL_COMMUNITY): Payer: Self-pay

## 2021-08-16 DIAGNOSIS — N1831 Chronic kidney disease, stage 3a: Secondary | ICD-10-CM | POA: Diagnosis not present

## 2021-08-16 DIAGNOSIS — Z79891 Long term (current) use of opiate analgesic: Secondary | ICD-10-CM | POA: Diagnosis not present

## 2021-08-16 DIAGNOSIS — I130 Hypertensive heart and chronic kidney disease with heart failure and stage 1 through stage 4 chronic kidney disease, or unspecified chronic kidney disease: Secondary | ICD-10-CM | POA: Diagnosis not present

## 2021-08-16 DIAGNOSIS — Z9981 Dependence on supplemental oxygen: Secondary | ICD-10-CM | POA: Diagnosis not present

## 2021-08-16 DIAGNOSIS — E1143 Type 2 diabetes mellitus with diabetic autonomic (poly)neuropathy: Secondary | ICD-10-CM | POA: Diagnosis not present

## 2021-08-16 DIAGNOSIS — R42 Dizziness and giddiness: Secondary | ICD-10-CM | POA: Diagnosis not present

## 2021-08-16 DIAGNOSIS — K279 Peptic ulcer, site unspecified, unspecified as acute or chronic, without hemorrhage or perforation: Secondary | ICD-10-CM | POA: Diagnosis not present

## 2021-08-16 DIAGNOSIS — Z9181 History of falling: Secondary | ICD-10-CM | POA: Diagnosis not present

## 2021-08-16 DIAGNOSIS — M1990 Unspecified osteoarthritis, unspecified site: Secondary | ICD-10-CM | POA: Diagnosis not present

## 2021-08-16 DIAGNOSIS — Z7401 Bed confinement status: Secondary | ICD-10-CM | POA: Diagnosis not present

## 2021-08-16 DIAGNOSIS — J9611 Chronic respiratory failure with hypoxia: Secondary | ICD-10-CM | POA: Diagnosis not present

## 2021-08-16 DIAGNOSIS — D860 Sarcoidosis of lung: Secondary | ICD-10-CM | POA: Diagnosis not present

## 2021-08-16 DIAGNOSIS — I5032 Chronic diastolic (congestive) heart failure: Secondary | ICD-10-CM | POA: Diagnosis not present

## 2021-08-16 DIAGNOSIS — E785 Hyperlipidemia, unspecified: Secondary | ICD-10-CM | POA: Diagnosis not present

## 2021-08-16 DIAGNOSIS — K219 Gastro-esophageal reflux disease without esophagitis: Secondary | ICD-10-CM | POA: Diagnosis not present

## 2021-08-16 DIAGNOSIS — Z87891 Personal history of nicotine dependence: Secondary | ICD-10-CM | POA: Diagnosis not present

## 2021-08-16 DIAGNOSIS — K7460 Unspecified cirrhosis of liver: Secondary | ICD-10-CM | POA: Diagnosis not present

## 2021-08-23 DIAGNOSIS — E1143 Type 2 diabetes mellitus with diabetic autonomic (poly)neuropathy: Secondary | ICD-10-CM | POA: Diagnosis not present

## 2021-08-23 DIAGNOSIS — Z79891 Long term (current) use of opiate analgesic: Secondary | ICD-10-CM | POA: Diagnosis not present

## 2021-08-23 DIAGNOSIS — I5032 Chronic diastolic (congestive) heart failure: Secondary | ICD-10-CM | POA: Diagnosis not present

## 2021-08-23 DIAGNOSIS — I130 Hypertensive heart and chronic kidney disease with heart failure and stage 1 through stage 4 chronic kidney disease, or unspecified chronic kidney disease: Secondary | ICD-10-CM | POA: Diagnosis not present

## 2021-08-23 DIAGNOSIS — J9611 Chronic respiratory failure with hypoxia: Secondary | ICD-10-CM | POA: Diagnosis not present

## 2021-08-23 DIAGNOSIS — K219 Gastro-esophageal reflux disease without esophagitis: Secondary | ICD-10-CM | POA: Diagnosis not present

## 2021-08-23 DIAGNOSIS — N1831 Chronic kidney disease, stage 3a: Secondary | ICD-10-CM | POA: Diagnosis not present

## 2021-08-23 DIAGNOSIS — D860 Sarcoidosis of lung: Secondary | ICD-10-CM | POA: Diagnosis not present

## 2021-08-23 DIAGNOSIS — K7460 Unspecified cirrhosis of liver: Secondary | ICD-10-CM | POA: Diagnosis not present

## 2021-08-23 DIAGNOSIS — Z9181 History of falling: Secondary | ICD-10-CM | POA: Diagnosis not present

## 2021-08-23 DIAGNOSIS — Z9981 Dependence on supplemental oxygen: Secondary | ICD-10-CM | POA: Diagnosis not present

## 2021-08-23 DIAGNOSIS — M1990 Unspecified osteoarthritis, unspecified site: Secondary | ICD-10-CM | POA: Diagnosis not present

## 2021-08-23 DIAGNOSIS — K279 Peptic ulcer, site unspecified, unspecified as acute or chronic, without hemorrhage or perforation: Secondary | ICD-10-CM | POA: Diagnosis not present

## 2021-08-23 DIAGNOSIS — Z87891 Personal history of nicotine dependence: Secondary | ICD-10-CM | POA: Diagnosis not present

## 2021-08-23 DIAGNOSIS — Z7401 Bed confinement status: Secondary | ICD-10-CM | POA: Diagnosis not present

## 2021-08-23 DIAGNOSIS — R42 Dizziness and giddiness: Secondary | ICD-10-CM | POA: Diagnosis not present

## 2021-08-23 DIAGNOSIS — E785 Hyperlipidemia, unspecified: Secondary | ICD-10-CM | POA: Diagnosis not present

## 2021-08-24 ENCOUNTER — Other Ambulatory Visit (HOSPITAL_COMMUNITY): Payer: Self-pay

## 2021-08-25 ENCOUNTER — Other Ambulatory Visit (HOSPITAL_COMMUNITY): Payer: Self-pay

## 2021-08-25 DIAGNOSIS — Z9181 History of falling: Secondary | ICD-10-CM | POA: Diagnosis not present

## 2021-08-25 DIAGNOSIS — Z79891 Long term (current) use of opiate analgesic: Secondary | ICD-10-CM | POA: Diagnosis not present

## 2021-08-25 DIAGNOSIS — K279 Peptic ulcer, site unspecified, unspecified as acute or chronic, without hemorrhage or perforation: Secondary | ICD-10-CM | POA: Diagnosis not present

## 2021-08-25 DIAGNOSIS — R7989 Other specified abnormal findings of blood chemistry: Secondary | ICD-10-CM | POA: Diagnosis not present

## 2021-08-25 DIAGNOSIS — M1990 Unspecified osteoarthritis, unspecified site: Secondary | ICD-10-CM | POA: Diagnosis not present

## 2021-08-25 DIAGNOSIS — N1831 Chronic kidney disease, stage 3a: Secondary | ICD-10-CM | POA: Diagnosis not present

## 2021-08-25 DIAGNOSIS — K219 Gastro-esophageal reflux disease without esophagitis: Secondary | ICD-10-CM | POA: Diagnosis not present

## 2021-08-25 DIAGNOSIS — I5032 Chronic diastolic (congestive) heart failure: Secondary | ICD-10-CM | POA: Diagnosis not present

## 2021-08-25 DIAGNOSIS — Z87891 Personal history of nicotine dependence: Secondary | ICD-10-CM | POA: Diagnosis not present

## 2021-08-25 DIAGNOSIS — D860 Sarcoidosis of lung: Secondary | ICD-10-CM | POA: Diagnosis not present

## 2021-08-25 DIAGNOSIS — K7460 Unspecified cirrhosis of liver: Secondary | ICD-10-CM | POA: Diagnosis not present

## 2021-08-25 DIAGNOSIS — R42 Dizziness and giddiness: Secondary | ICD-10-CM | POA: Diagnosis not present

## 2021-08-25 DIAGNOSIS — E1143 Type 2 diabetes mellitus with diabetic autonomic (poly)neuropathy: Secondary | ICD-10-CM | POA: Diagnosis not present

## 2021-08-25 DIAGNOSIS — Z9981 Dependence on supplemental oxygen: Secondary | ICD-10-CM | POA: Diagnosis not present

## 2021-08-25 DIAGNOSIS — I130 Hypertensive heart and chronic kidney disease with heart failure and stage 1 through stage 4 chronic kidney disease, or unspecified chronic kidney disease: Secondary | ICD-10-CM | POA: Diagnosis not present

## 2021-08-25 DIAGNOSIS — Z7401 Bed confinement status: Secondary | ICD-10-CM | POA: Diagnosis not present

## 2021-08-25 DIAGNOSIS — E785 Hyperlipidemia, unspecified: Secondary | ICD-10-CM | POA: Diagnosis not present

## 2021-08-25 DIAGNOSIS — J9611 Chronic respiratory failure with hypoxia: Secondary | ICD-10-CM | POA: Diagnosis not present

## 2021-08-25 MED ORDER — DONEPEZIL HCL 10 MG PO TABS
10.0000 mg | ORAL_TABLET | Freq: Every day | ORAL | 0 refills | Status: AC
  Filled 2021-08-25: qty 90, 90d supply, fill #0

## 2021-08-26 ENCOUNTER — Other Ambulatory Visit (HOSPITAL_COMMUNITY): Payer: Self-pay

## 2021-08-28 ENCOUNTER — Other Ambulatory Visit (HOSPITAL_COMMUNITY): Payer: Self-pay

## 2021-08-29 ENCOUNTER — Other Ambulatory Visit (HOSPITAL_COMMUNITY): Payer: Self-pay

## 2021-09-01 ENCOUNTER — Other Ambulatory Visit (HOSPITAL_COMMUNITY): Payer: Self-pay

## 2021-09-01 DIAGNOSIS — D860 Sarcoidosis of lung: Secondary | ICD-10-CM | POA: Diagnosis not present

## 2021-09-01 DIAGNOSIS — K279 Peptic ulcer, site unspecified, unspecified as acute or chronic, without hemorrhage or perforation: Secondary | ICD-10-CM | POA: Diagnosis not present

## 2021-09-01 DIAGNOSIS — I5032 Chronic diastolic (congestive) heart failure: Secondary | ICD-10-CM | POA: Diagnosis not present

## 2021-09-01 DIAGNOSIS — Z79891 Long term (current) use of opiate analgesic: Secondary | ICD-10-CM | POA: Diagnosis not present

## 2021-09-01 DIAGNOSIS — K7460 Unspecified cirrhosis of liver: Secondary | ICD-10-CM | POA: Diagnosis not present

## 2021-09-01 DIAGNOSIS — E1143 Type 2 diabetes mellitus with diabetic autonomic (poly)neuropathy: Secondary | ICD-10-CM | POA: Diagnosis not present

## 2021-09-01 DIAGNOSIS — N1831 Chronic kidney disease, stage 3a: Secondary | ICD-10-CM | POA: Diagnosis not present

## 2021-09-01 DIAGNOSIS — M47817 Spondylosis without myelopathy or radiculopathy, lumbosacral region: Secondary | ICD-10-CM | POA: Diagnosis not present

## 2021-09-01 DIAGNOSIS — K219 Gastro-esophageal reflux disease without esophagitis: Secondary | ICD-10-CM | POA: Diagnosis not present

## 2021-09-01 DIAGNOSIS — J9611 Chronic respiratory failure with hypoxia: Secondary | ICD-10-CM | POA: Diagnosis not present

## 2021-09-01 DIAGNOSIS — Z9181 History of falling: Secondary | ICD-10-CM | POA: Diagnosis not present

## 2021-09-01 DIAGNOSIS — M1990 Unspecified osteoarthritis, unspecified site: Secondary | ICD-10-CM | POA: Diagnosis not present

## 2021-09-01 DIAGNOSIS — Z87891 Personal history of nicotine dependence: Secondary | ICD-10-CM | POA: Diagnosis not present

## 2021-09-01 DIAGNOSIS — E785 Hyperlipidemia, unspecified: Secondary | ICD-10-CM | POA: Diagnosis not present

## 2021-09-01 DIAGNOSIS — I130 Hypertensive heart and chronic kidney disease with heart failure and stage 1 through stage 4 chronic kidney disease, or unspecified chronic kidney disease: Secondary | ICD-10-CM | POA: Diagnosis not present

## 2021-09-01 DIAGNOSIS — G894 Chronic pain syndrome: Secondary | ICD-10-CM | POA: Diagnosis not present

## 2021-09-01 DIAGNOSIS — R42 Dizziness and giddiness: Secondary | ICD-10-CM | POA: Diagnosis not present

## 2021-09-01 DIAGNOSIS — Z9981 Dependence on supplemental oxygen: Secondary | ICD-10-CM | POA: Diagnosis not present

## 2021-09-01 DIAGNOSIS — Z7401 Bed confinement status: Secondary | ICD-10-CM | POA: Diagnosis not present

## 2021-09-01 MED ORDER — HYDROCODONE-ACETAMINOPHEN 10-325 MG PO TABS
ORAL_TABLET | ORAL | 0 refills | Status: AC
  Filled 2021-09-09: qty 120, 30d supply, fill #0

## 2021-09-07 ENCOUNTER — Other Ambulatory Visit (HOSPITAL_COMMUNITY): Payer: Self-pay

## 2021-09-07 DIAGNOSIS — N184 Chronic kidney disease, stage 4 (severe): Secondary | ICD-10-CM | POA: Diagnosis not present

## 2021-09-07 DIAGNOSIS — R0602 Shortness of breath: Secondary | ICD-10-CM | POA: Diagnosis not present

## 2021-09-07 DIAGNOSIS — I5032 Chronic diastolic (congestive) heart failure: Secondary | ICD-10-CM | POA: Diagnosis not present

## 2021-09-07 MED ORDER — ALPRAZOLAM 0.25 MG PO TABS
ORAL_TABLET | ORAL | 3 refills | Status: AC
  Filled 2021-09-07: qty 30, 30d supply, fill #0

## 2021-09-09 ENCOUNTER — Other Ambulatory Visit (HOSPITAL_COMMUNITY): Payer: Self-pay

## 2021-09-14 ENCOUNTER — Other Ambulatory Visit (HOSPITAL_COMMUNITY): Payer: Self-pay

## 2021-09-14 DIAGNOSIS — D860 Sarcoidosis of lung: Secondary | ICD-10-CM | POA: Diagnosis not present

## 2021-09-14 DIAGNOSIS — N184 Chronic kidney disease, stage 4 (severe): Secondary | ICD-10-CM | POA: Diagnosis not present

## 2021-09-14 DIAGNOSIS — I10 Essential (primary) hypertension: Secondary | ICD-10-CM | POA: Diagnosis not present

## 2021-09-14 DIAGNOSIS — I509 Heart failure, unspecified: Secondary | ICD-10-CM | POA: Diagnosis not present

## 2021-09-15 ENCOUNTER — Encounter (HOSPITAL_COMMUNITY): Payer: Self-pay | Admitting: *Deleted

## 2021-09-15 ENCOUNTER — Other Ambulatory Visit (HOSPITAL_COMMUNITY): Payer: Self-pay

## 2021-09-15 MED ORDER — HYOSCYAMINE SULFATE 0.125 MG PO TABS
ORAL_TABLET | ORAL | 0 refills | Status: AC
  Filled 2021-09-15: qty 30, 4d supply, fill #0

## 2021-09-15 MED ORDER — MORPHINE SULFATE (CONCENTRATE) 20 MG/ML PO SOLN
ORAL | 0 refills | Status: AC
  Filled 2021-09-15: qty 30, 5d supply, fill #0

## 2021-09-15 MED ORDER — ALPRAZOLAM 0.25 MG PO TABS
ORAL_TABLET | ORAL | 0 refills | Status: AC
  Filled 2021-09-15: qty 42, 14d supply, fill #0

## 2021-09-22 ENCOUNTER — Other Ambulatory Visit (HOSPITAL_COMMUNITY): Payer: Self-pay

## 2021-09-22 MED ORDER — TRAZODONE HCL 50 MG PO TABS
50.0000 mg | ORAL_TABLET | Freq: Every day | ORAL | 2 refills | Status: AC
  Filled 2021-09-22: qty 14, 14d supply, fill #0

## 2021-09-29 ENCOUNTER — Other Ambulatory Visit (HOSPITAL_COMMUNITY): Payer: Self-pay

## 2021-09-29 MED ORDER — LORAZEPAM 0.5 MG PO TABS
ORAL_TABLET | ORAL | 0 refills | Status: AC
  Filled 2021-09-29: qty 30, 3d supply, fill #0

## 2021-10-26 DEATH — deceased
